# Patient Record
Sex: Male | Born: 1980 | Marital: Single | Smoking: Never smoker
Health system: Southern US, Community
[De-identification: ages and names within clinical notes are randomized; demographics above are authoritative.]

## PROBLEM LIST (undated history)

## (undated) DIAGNOSIS — C8190 Hodgkin lymphoma, unspecified, unspecified site: Secondary | ICD-10-CM

## (undated) DIAGNOSIS — I10 Essential (primary) hypertension: Secondary | ICD-10-CM

## (undated) DIAGNOSIS — B20 Human immunodeficiency virus [HIV] disease: Secondary | ICD-10-CM

## (undated) DIAGNOSIS — L309 Dermatitis, unspecified: Secondary | ICD-10-CM

## (undated) DIAGNOSIS — B029 Zoster without complications: Secondary | ICD-10-CM

## (undated) DIAGNOSIS — Z9109 Other allergy status, other than to drugs and biological substances: Secondary | ICD-10-CM

## (undated) DIAGNOSIS — Z21 Asymptomatic human immunodeficiency virus [HIV] infection status: Secondary | ICD-10-CM

## (undated) HISTORY — DX: Human immunodeficiency virus (HIV) disease: B20

## (undated) HISTORY — DX: Essential (primary) hypertension: I10

## (undated) HISTORY — DX: Dermatitis, unspecified: L30.9

## (undated) HISTORY — PX: TYMPANOSTOMY TUBE PLACEMENT: SHX32

## (undated) HISTORY — PX: WISDOM TOOTH EXTRACTION: SHX21

## (undated) HISTORY — DX: Asymptomatic human immunodeficiency virus (hiv) infection status: Z21

## (undated) HISTORY — PX: WRIST SURGERY: SHX841

---
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
## 2006-07-19 ENCOUNTER — Emergency Department (HOSPITAL_COMMUNITY): Admission: EM | Admit: 2006-07-19 | Discharge: 2006-07-19 | Payer: Self-pay | Admitting: Emergency Medicine

## 2006-12-28 ENCOUNTER — Ambulatory Visit: Payer: Self-pay | Admitting: Internal Medicine

## 2007-01-15 ENCOUNTER — Ambulatory Visit: Payer: Self-pay | Admitting: Internal Medicine

## 2007-01-22 ENCOUNTER — Ambulatory Visit: Payer: Self-pay | Admitting: Internal Medicine

## 2007-01-22 ENCOUNTER — Encounter: Payer: Self-pay | Admitting: Internal Medicine

## 2007-02-07 ENCOUNTER — Encounter: Payer: Self-pay | Admitting: Internal Medicine

## 2007-02-07 ENCOUNTER — Ambulatory Visit: Payer: Self-pay | Admitting: Internal Medicine

## 2007-04-12 ENCOUNTER — Ambulatory Visit: Payer: Self-pay | Admitting: Internal Medicine

## 2007-04-12 DIAGNOSIS — H6500 Acute serous otitis media, unspecified ear: Secondary | ICD-10-CM | POA: Insufficient documentation

## 2007-10-03 ENCOUNTER — Encounter (INDEPENDENT_AMBULATORY_CARE_PROVIDER_SITE_OTHER): Payer: Self-pay | Admitting: *Deleted

## 2007-10-03 ENCOUNTER — Ambulatory Visit: Payer: Self-pay | Admitting: Infectious Diseases

## 2007-10-03 DIAGNOSIS — L0291 Cutaneous abscess, unspecified: Secondary | ICD-10-CM | POA: Insufficient documentation

## 2007-10-03 DIAGNOSIS — L0390 Cellulitis, unspecified: Secondary | ICD-10-CM

## 2007-10-03 DIAGNOSIS — B350 Tinea barbae and tinea capitis: Secondary | ICD-10-CM | POA: Insufficient documentation

## 2007-10-03 DIAGNOSIS — J309 Allergic rhinitis, unspecified: Secondary | ICD-10-CM | POA: Insufficient documentation

## 2007-10-11 LAB — CONVERTED CEMR LAB
HIV-1 antibody: POSITIVE
HIV-2 Ab: NEGATIVE
HIV: REACTIVE

## 2007-10-16 ENCOUNTER — Ambulatory Visit: Payer: Self-pay | Admitting: Internal Medicine

## 2007-10-16 DIAGNOSIS — B20 Human immunodeficiency virus [HIV] disease: Secondary | ICD-10-CM | POA: Insufficient documentation

## 2007-10-16 LAB — CONVERTED CEMR LAB
ALT: 26 units/L (ref 0–53)
AST: 23 units/L (ref 0–37)
Albumin: 3.2 g/dL — ABNORMAL LOW (ref 3.5–5.2)
Alkaline Phosphatase: 65 units/L (ref 39–117)
BUN: 13 mg/dL (ref 6–23)
Basophils Absolute: 0 10*3/uL (ref 0.0–0.1)
Basophils Relative: 0.3 % (ref 0.0–1.0)
Bilirubin, Direct: 0.1 mg/dL (ref 0.0–0.3)
CO2: 29 meq/L (ref 19–32)
Calcium: 9.1 mg/dL (ref 8.4–10.5)
Chloride: 108 meq/L (ref 96–112)
Creatinine, Ser: 1.1 mg/dL (ref 0.4–1.5)
Eosinophils Absolute: 0.2 10*3/uL (ref 0.0–0.6)
Eosinophils Relative: 3.2 % (ref 0.0–5.0)
GFR calc Af Amer: 106 mL/min
GFR calc non Af Amer: 87 mL/min
Glucose, Bld: 77 mg/dL (ref 70–99)
HCT: 40.2 % (ref 39.0–52.0)
Hemoglobin: 13.7 g/dL (ref 13.0–17.0)
Lymphocytes Relative: 33.5 % (ref 12.0–46.0)
MCHC: 34.1 g/dL (ref 30.0–36.0)
MCV: 87.6 fL (ref 78.0–100.0)
Monocytes Absolute: 0.5 10*3/uL (ref 0.2–0.7)
Monocytes Relative: 8.2 % (ref 3.0–11.0)
Neutro Abs: 3.2 10*3/uL (ref 1.4–7.7)
Neutrophils Relative %: 54.8 % (ref 43.0–77.0)
Platelets: 256 10*3/uL (ref 150–400)
Potassium: 4 meq/L (ref 3.5–5.1)
RBC: 4.59 M/uL (ref 4.22–5.81)
RDW: 12.1 % (ref 11.5–14.6)
Sodium: 138 meq/L (ref 135–145)
TSH: 0.82 microintl units/mL (ref 0.35–5.50)
Total Bilirubin: 0.7 mg/dL (ref 0.3–1.2)
Total Protein: 9.2 g/dL — ABNORMAL HIGH (ref 6.0–8.3)
WBC: 5.9 10*3/uL (ref 4.5–10.5)

## 2007-10-17 ENCOUNTER — Encounter: Payer: Self-pay | Admitting: Internal Medicine

## 2007-10-17 LAB — CONVERTED CEMR LAB
Absolute CD4: 383 #/uL (ref 381–1469)
CD4 T Helper %: 25 % — ABNORMAL LOW (ref 32–62)
HCV Ab: NEGATIVE
HIV 1 RNA Quant: 38200 copies/mL — ABNORMAL HIGH (ref <50)
HIV-1 RNA Quant, Log: 4.58 — ABNORMAL HIGH (ref <1.70)
Hep B Core Total Ab: NEGATIVE
Hep B S Ab: POSITIVE — AB
Hepatitis B Surface Ag: NEGATIVE
Total Lymphocytes %: 30 % (ref 12–46)
Total lymphocyte count: 1530 cells/mcL (ref 700–3300)
WBC, lymph enumeration: 5.1 10*3/uL (ref 4.0–10.5)

## 2007-12-19 ENCOUNTER — Encounter: Payer: Self-pay | Admitting: Internal Medicine

## 2008-02-27 ENCOUNTER — Emergency Department (HOSPITAL_COMMUNITY): Admission: EM | Admit: 2008-02-27 | Discharge: 2008-02-28 | Payer: Self-pay | Admitting: Emergency Medicine

## 2008-04-09 ENCOUNTER — Ambulatory Visit: Payer: Self-pay | Admitting: Internal Medicine

## 2008-04-09 DIAGNOSIS — M659 Unspecified synovitis and tenosynovitis, unspecified site: Secondary | ICD-10-CM | POA: Insufficient documentation

## 2009-01-13 ENCOUNTER — Ambulatory Visit: Payer: Self-pay | Admitting: Internal Medicine

## 2009-01-13 DIAGNOSIS — M65839 Other synovitis and tenosynovitis, unspecified forearm: Secondary | ICD-10-CM | POA: Insufficient documentation

## 2009-01-13 DIAGNOSIS — M65849 Other synovitis and tenosynovitis, unspecified hand: Secondary | ICD-10-CM

## 2009-04-15 ENCOUNTER — Encounter: Payer: Self-pay | Admitting: Internal Medicine

## 2009-07-22 ENCOUNTER — Encounter: Payer: Self-pay | Admitting: Internal Medicine

## 2009-10-10 ENCOUNTER — Emergency Department (HOSPITAL_BASED_OUTPATIENT_CLINIC_OR_DEPARTMENT_OTHER): Admission: EM | Admit: 2009-10-10 | Discharge: 2009-10-10 | Payer: Self-pay | Admitting: Emergency Medicine

## 2009-12-12 ENCOUNTER — Other Ambulatory Visit: Payer: Self-pay | Admitting: Emergency Medicine

## 2009-12-12 ENCOUNTER — Inpatient Hospital Stay (HOSPITAL_COMMUNITY): Admission: EM | Admit: 2009-12-12 | Discharge: 2009-12-16 | Payer: Self-pay | Admitting: Internal Medicine

## 2009-12-28 ENCOUNTER — Ambulatory Visit: Payer: Self-pay | Admitting: Internal Medicine

## 2009-12-28 DIAGNOSIS — K5289 Other specified noninfective gastroenteritis and colitis: Secondary | ICD-10-CM | POA: Insufficient documentation

## 2009-12-30 ENCOUNTER — Telehealth: Payer: Self-pay

## 2010-08-23 NOTE — Assessment & Plan Note (Signed)
   Vital Signs:  Patient Profile:   30 Years Old Male Weight:      283 pounds               History of Present Illness: 30 year old gentleman, who presents with a chief complaint of swollen glands in the neck area.  These are largely painless.  He also has noticed slightly tender nodule just below the umbilicus.  He denies any fever or other constitutional symptoms.  He states that a nodule in the left pre- auricular area has decreased in size.         Physical Exam  General:     overweight-appearing.   Head:     Normocephalic and atraumatic without obvious abnormalities. No apparent alopecia or balding. Mouth:     Oral mucosa and oropharynx without lesions or exudates.  Teeth in good repair. Neck:     multiple nodules in the submandibular measuring two to 4 cm noted.  These are nontender.  Also present in the sub-mental area Abdomen:     Bowel sounds positive,abdomen soft and non-tender without masses, organomegaly or hernias noted. Cervical Nodes:     see above Axillary Nodes:     No palpable lymphadenopathy Inguinal Nodes:     No significant adenopathy    Impression & Recommendations:  Problem # 1:  cervical adenopathy believe this most likely represents a viral lymphadenitis and the patient may be improving.  Will recheck in two weeks or p.r.n. if he develops new symptoms.  The subcutaneous nodule just below the umbilicus appears to be a benign sebaceous cyst.   Patient Instructions: 1)  Please schedule a follow-up appointment in 2 weeks. 2)  The patient will return sooner if he develops worsening symptoms, enlarging lymph glands or any constitutional symptoms.

## 2010-08-23 NOTE — Assessment & Plan Note (Signed)
   Vital Signs:  Patient Profile:   30 Years Old Male Weight:      276 pounds BP supine:   136 / 76               History of Present Illness: 30 year old gentleman seen today for follow-up of his cervical adenopathy.  This has improved greatly and has largely resolved.  He denies any constitutional symptoms        Physical Exam  General:     Well-developed,well-nourished,in no acute distress; alert,appropriate and cooperative throughout examination Head:     Normocephalic and atraumatic without obvious abnormalities. No apparent alopecia or balding. Mouth:     Oral mucosa and oropharynx without lesions or exudates.  Teeth in good repair. Neck:     still has some right-sided subclavicular and also some submental a small nodes.  These have greatly diminished in size    Impression & Recommendations:  Problem # 1:  reactive lymphadenopathy, improved will continue to observe at the present time.  He will report any clinical worsening

## 2010-08-23 NOTE — Miscellaneous (Signed)
Summary: HIV Testing  HIV Testing   Imported By: Florinda Marker 10/14/2007 15:19:30  _____________________________________________________________________  External Attachment:    Type:   Image     Comment:   External Document

## 2010-08-23 NOTE — Progress Notes (Signed)
Summary: ppwk ready for pick up  Phone Note Outgoing Call   Call placed by: Duard Brady LPN,  December 30, 1608 9:28 AM Call placed to: Patient Summary of Call: ans mach - LMTCB if question - ppwk ready for pick up . KIK Initial call taken by: Duard Brady LPN,  December 31, 9602 9:28 AM

## 2010-08-23 NOTE — Letter (Signed)
Summary: Out of Work  Adult nurse at [employer]  [address]   [place], [place] [address]   Phone: [phone]  Fax: [phone]    December 28, 2009   Employee:  CHAVEZ ROSOL    To Whom It May Concern:   For Medical reasons, please excuse the above named employee from work for the following dates:  Start:   01-12-2010  End:   02-02-2010  If you need additional information, please feel free to contact our office.         Sincerely,    Gordy Savers  MD

## 2010-08-23 NOTE — Assessment & Plan Note (Signed)
Summary: ?carpal tunnel/cjr pt rsc/njr   Vital Signs:  Patient profile:   30 year old male Weight:      256 pounds BP sitting:   130 / 90  (right arm) Cuff size:   large  Vitals Entered By: Raechel Ache, RN (January 13, 2009 11:16 AM)  CC:  C/o wrist pain and swelling- L worse.Lee [name]  History of Present Illness: 30 year old gentleman who's had some bilateral wrist pain.  His right wrist has essentially normalized.  For the past two weeks, he  has had considerable discomfort involving the right radial wrist.  He has been using a wrist splint anti-inflammatories, rest and ice.  There is been little clinical improvement  Allergies: No Known Drug Allergies  Physical Exam  General:  Well-developed,well-nourished,in no acute distress; alert,appropriate and cooperative throughout examination Msk:  considerable tenderness about the radial aspect of the left wrist and at the base of the left thumb   Impression & Recommendations:  Problem # 1:  TENDINITIS, LEFT WRIST (ICD-727.05)  will set up for orthopedic evaluation  Orders: Orthopedic Referral (Ortho)  Complete Medication List: 1)  Diclofenac Sodium 75 Mg Tbec (Diclofenac sodium) .... One twice daily  Patient Instructions: 1)  Please schedule a follow-up appointment as needed. 2)  orthopedic follow-up as scheduled

## 2010-08-23 NOTE — Letter (Signed)
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         Sincerely,    Eleonore Chiquito, MD

## 2010-08-23 NOTE — Assessment & Plan Note (Signed)
Summary: cant bend fingers on right hand/mhf   Vital Signs:  Patient Profile:   30 Years Old Male Weight:      239 pounds Temp:     98.8 degrees F oral Pulse rate:   88 / minute BP sitting:   136 / 86  (left arm) Cuff size:   regular  Vitals Entered By: Jerilynn Mages (April 09, 2008 11:21 AM)                 Chief Complaint:  can't bend fingers on R hand.  History of Present Illness: 30 year old patient, positive HIV followed at [facility], who presented with an approximate two-month history of swelling and stiffness involving the MCP and PIP joints of his right second and third digits.  There is a no progression of symptoms.  No other joint involvement, or systemic symptoms.  No history of trauma    Current Allergies: No known allergies   Past Medical History:    Reviewed history from 10/16/2007 and no changes required:       Allergic rhinitis       HIV-positive      Physical Exam  General:     Well-developed,well-nourished,in no acute distress; alert,appropriate and cooperative throughout examination Msk:     patient does have some minimal soft tissue swelling, discomfort, and diminished range of motion involving his right second MCP and PIP joints    Impression & Recommendations:  Problem # 1:  SYNOVITIS (ICD-727.00) patient is scheduled for follow-up at [facility] later on this month;  will place on anti-inflammatory drugs, and they will consider rheumatologic referral and/or x-ray of the hand.  He has been told to call here if he develops any worsening symptoms  Problem # 2:  HIV TEST POSITIVE (ICD-042)  The following medications were removed from the medication list:    Doxycycline Hyclate 100 Mg Caps (Doxycycline hyclate) .Marland Kitchen... Take 1 capsule by mouth two times a day   Complete Medication List: 1)  Diclofenac Sodium 75 Mg Tbec (Diclofenac sodium) .... One twice daily   Patient Instructions: 1)   take medications as directed 2)   follow-up at the [facility] later this month as scheduled; 3)  consider x-ray and rheumatologic evaluation   Prescriptions: DICLOFENAC SODIUM 75 MG TBEC (DICLOFENAC SODIUM) one twice daily  #50 x 0   Entered and Authorized by:   Gordy Savers  MD   Signed by:   Gordy Savers  MD on 04/09/2008   Method used:   Print then Give to Patient   RxID:   [id number]  ]  Appended Document: cant bend fingers on right hand/mhf        Current Allergies: No known allergies         Impression & Recommendations: Flu Vaccine Consent Questions     Do you have a history of severe allergic reactions to this vaccine? no    Any prior history of allergic reactions to egg and/or gelatin? no    Do you have a sensitivity to the preservative Thimersol? no    Do you have a past history of Guillan-Barre Syndrome? no    Do you currently have an acute febrile illness? no    Have you ever had a severe reaction to latex? no    Vaccine information given and explained to patient? yes    Are you currently pregnant? no    Lot Number:[id number]   Site Given Right Deltoid IM   Complete Medication  List: 1)  Diclofenac Sodium 75 Mg Tbec (Diclofenac sodium) .... One twice daily    ]
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
## 2010-08-23 NOTE — Assessment & Plan Note (Signed)
Summary: abn labs   Vital Signs:  Patient Profile:   30 Years Old Male Weight:      243 pounds BP sitting:   128 / 80  (left arm) Cuff size:   regular  Vitals Entered By: Raechel Ache, RN (October 16, 2007 10:32 AM)                 Chief Complaint:  F/u abnormal labs..  History of Present Illness: 30 year old gentleman seen today to discuss his positive serology for HIV.  He was seen at the [facility] urgent care center last week and had an abdominal wall abscess I&D and an HIV test was performed that was reactive, with confirmation with Western blot testing.  At the present time, he clinically feels well and denies any systemic symptoms.  He was seen in June of last year for a viral syndrome, associated with cervical adenopathy.  Follow-up examination revealed resolution of the enlarged cervical glands.  The patient states that his male partner at that  time is now known to be HIV positive; he also states that his present whereabouts are unknown.    Current Allergies: No known allergies   Past Medical History:    Allergic rhinitis    HIV-positive  Past Surgical History:    none   Family History:    Family History Diabetes 1st degree relative    Family History Hypertension    father a second line is in good health    mother, age 40, living and well    one sister enjoys good health  Social History:    Single    Gay    employed by  [employer]    Review of Systems  The patient denies anorexia, fever, weight loss, muscle weakness, suspicious skin lesions, and enlarged lymph nodes.     Physical Exam  General:     Well-developed,well-nourished,in no acute distress; alert,appropriate and cooperative throughout examination Head:     Normocephalic and atraumatic without obvious abnormalities. No apparent alopecia or balding. Eyes:     No corneal or conjunctival inflammation noted. EOMI. Perrla. Funduscopic exam benign, without hemorrhages, exudates or  papilledema. Vision grossly normal. Mouth:     Oral mucosa and oropharynx without lesions or exudates.  Teeth in good repair. Neck:     No deformities, masses, or tenderness noted. Chest Wall:     No deformities, masses, tenderness or gynecomastia noted. Lungs:     Normal respiratory effort, chest expands symmetrically. Lungs are clear to auscultation, no crackles or wheezes. Heart:     Normal rate and regular rhythm. S1 and S2 normal without gallop, murmur, click, rub or other extra sounds. Abdomen:     Bowel sounds positive,abdomen soft and non-tender without masses, organomegaly or hernias noted. Msk:     No deformity or scoliosis noted of thoracic or lumbar spine.   Pulses:     R and L carotid,radial,femoral,dorsalis pedis and posterior tibial pulses are full and equal bilaterally Extremities:     No clubbing, cyanosis, edema, or deformity noted with normal full range of motion of all joints.   Skin:     2-cm nodule just to the right of the umbilicus.  Noninflammatory Cervical Nodes:     No lymphadenopathy noted Axillary Nodes:     No palpable lymphadenopathy Inguinal Nodes:     No significant adenopathy    Impression & Recommendations:  Problem # 1:  HIV TEST POSITIVE (ICD-042)  His updated medication list for  this problem includes:    Doxycycline Hyclate 100 Mg Caps (Doxycycline hyclate) .Marland Kitchen... Take 1 capsule by mouth two times a day  Orders: Venipuncture (21308) TLB-BMP (Basic Metabolic Panel-BMET) (80048-METABOL) TLB-CBC Platelet - w/Differential (85025-CBCD) TLB-Hepatic/Liver Function Pnl (80076-HEPATIC) TLB-TSH (Thyroid Stimulating Hormone) (84443-TSH) T-Hepatitis B Core Antibody (65784-69629) T-Hepatitis B Surface Antigen (52841-32440) T-Hepatitis B Surface Antibody (10272-53664) T-Hepatitis C Antibody (40347-42595) T-RPR (Syphilis) (63875-64332) Infectious Disease Referral (ID)  will check screen laboratory data to include chronic hepatitis panel, RPR;  will also check a CD4 count, HIV viral load. Will refer to ID clinic may be additional baseline laboratory serologies, such as  toxo titer  Complete Medication List: 1)  Doxycycline Hyclate 100 Mg Caps (Doxycycline hyclate) .... Take 1 capsule by mouth two times a day  Other Orders: T- * Misc. Laboratory test [phone])   Patient Instructions: 1)  follow-up with the infectious disease clinic as scheduled. 2)  Please schedule a follow-up appointment as needed.    ]

## 2010-08-23 NOTE — Consult Note (Signed)
[facility]   Imported By: Kassie Mends 01/10/2008 08:44:43  _____________________________________________________________________  External Attachment:    Type:   Image     Comment:   [facility]

## 2010-08-23 NOTE — Assessment & Plan Note (Signed)
Summary: CYST W/DRAINAGE/REFERRED FROM [facility] IN ED/DS   Vital Signs:  Patient Profile:   30 Years Old Male Weight:      240.8 pounds (109.45 kg) Temp:     97.5 degrees F (36.39 degrees C) oral Pulse rate:   88 / minute BP sitting:   133 / 77  (right arm)  Pt. in pain?   yes    Location:   right side of face, left side chest and upper right abd    Intensity:   6  Vitals Entered By: Stanton Kidney Ditzler RN (October 03, 2007 3:20 PM)              Is Patient Diabetic? No Nutritional Status Detail good  Have you ever been in a relationship where you felt threatened, hurt or afraid?denies   Does patient need assistance? Functional Status Self care Ambulation Normal     Chief Complaint:  New pt - to have cysts drained - has had for 2 months..  History of Present Illness: This is a 30 year old man with a past medical history of seasonal allergies who was referred here by an ED nurse for drainage of a "cyst" on his chest. He is actually a patient of [facility] and plans to return there as a regular patient. He has had these recurrent "cysts" for 2 months now, one on his face, one to the right of his navel, and one around his left nipple.  They are intermittently painful, with minimal swelling.  THe one on his face started draining yesterday.  The one on his chest is new.  Has no fever, chills, sweats.  No weight loss.    Current Allergies (reviewed today): No known allergies   Past Medical History:    Allergic rhinitis    Risk Factors:  Tobacco use:  quit   Review of Systems  General      Denies chills, fever, malaise, and sweats.  CV      Denies chest pain or discomfort, palpitations, and shortness of breath with exertion.  Resp      Denies cough and shortness of breath.  GI      Denies abdominal pain.  Derm      See HPI   Physical Exam  General:     alert, well-developed, well-nourished, and well-hydrated.   Head:     normocephalic and atraumatic.     Lungs:     Normal respiratory effort, chest expands symmetrically. Lungs are clear to auscultation, no crackles or wheezes. Heart:     Normal rate and regular rhythm. S1 and S2 normal without gallop, murmur, click, rub or other extra sounds. Abdomen:     Bowel sounds positive,abdomen soft and non-tender without masses, organomegaly or hernias noted. Skin:     As noted in HPI.  No signs of cellulitis, no drainage.  Lesion on chest and navel are too deep to I&D, with minimal fluctuance.  THey are tender, navel>chest.  Lesion on face draining serosanguinous fluid.  No signs of cellulitis.    Impression & Recommendations:  Problem # 1:  ABSCESS, SKIN (ICD-682.9) Too deep and too young to drain.  WIll recommended warm compresses 4 times a day along with doxycycline, and return either here or to his PCP next week for possible drainage if more superficial. His updated medication list for this problem includes:    Doxycycline Hyclate 100 Mg Caps (Doxycycline hyclate) .Marland Kitchen... Take 1 capsule by mouth two times a day  Orders:  T-HIV Antibody  (Reflex) ([id number])   Complete Medication List: 1)  Doxycycline Hyclate 100 Mg Caps (Doxycycline hyclate) .... Take 1 capsule by mouth two times a day   Patient Instructions: 1)  Please schedule a follow-up appointment at the beginning of next week, either here or with your primary doctor.    Prescriptions: DOXYCYCLINE HYCLATE 100 MG  CAPS (DOXYCYCLINE HYCLATE) Take 1 capsule by mouth two times a day  #20 x 0   Entered and Authorized by:   Dellia Beckwith MD   Signed by:   Dellia Beckwith MD on 10/03/2007   Method used:   Print then Give to Patient   RxID:   [id number]  ]

## 2010-08-23 NOTE — Letter (Signed)
Summary: [facility]  [facility]   Imported By: Maryln Gottron 07/29/2009 14:16:33  _____________________________________________________________________  External Attachment:    Type:   Image     Comment:   External Document

## 2010-08-23 NOTE — Letter (Signed)
Summary: Rheumatology-Dr. Kellie Simmering  Rheumatology-Dr. Kellie Simmering   Imported By: Maryln Gottron 04/23/2009 15:04:26  _____________________________________________________________________  External Attachment:    Type:   Image     Comment:   External Document

## 2010-10-10 LAB — CBC
HCT: 51.4 % (ref 39.0–52.0)
HCT: 41.8 % (ref 39.0–52.0)
HCT: 43.2 % (ref 39.0–52.0)
Hemoglobin: 17.7 g/dL — ABNORMAL HIGH (ref 13.0–17.0)
Hemoglobin: 14.8 g/dL (ref 13.0–17.0)
Hemoglobin: 15 g/dL (ref 13.0–17.0)
MCHC: 34.4 g/dL (ref 30.0–36.0)
MCHC: 34.8 g/dL (ref 30.0–36.0)
MCHC: 35.4 g/dL (ref 30.0–36.0)
MCV: 88.8 fL (ref 78.0–100.0)
MCV: 89.3 fL (ref 78.0–100.0)
MCV: 89.4 fL (ref 78.0–100.0)
Platelets: 345 10*3/uL (ref 150–400)
Platelets: 220 10*3/uL (ref 150–400)
Platelets: 228 10*3/uL (ref 150–400)
RBC: 5.79 MIL/uL (ref 4.22–5.81)
RBC: 4.68 MIL/uL (ref 4.22–5.81)
RBC: 4.84 MIL/uL (ref 4.22–5.81)
RDW: 13.4 % (ref 11.5–15.5)
RDW: 13.7 % (ref 11.5–15.5)
RDW: 14.2 % (ref 11.5–15.5)
WBC: 14.3 10*3/uL — ABNORMAL HIGH (ref 4.0–10.5)
WBC: 6.8 10*3/uL (ref 4.0–10.5)
WBC: 8.7 10*3/uL (ref 4.0–10.5)

## 2010-10-10 LAB — BASIC METABOLIC PANEL
BUN: 34 mg/dL — ABNORMAL HIGH (ref 6–23)
BUN: 10 mg/dL (ref 6–23)
BUN: 12 mg/dL (ref 6–23)
BUN: 20 mg/dL (ref 6–23)
CO2: 12 mEq/L — ABNORMAL LOW (ref 19–32)
CO2: 20 mEq/L (ref 19–32)
CO2: 20 mEq/L (ref 19–32)
CO2: 23 mEq/L (ref 19–32)
Calcium: 10.4 mg/dL (ref 8.4–10.5)
Calcium: 8.3 mg/dL — ABNORMAL LOW (ref 8.4–10.5)
Calcium: 8.5 mg/dL (ref 8.4–10.5)
Calcium: 8.5 mg/dL (ref 8.4–10.5)
Chloride: 109 mEq/L (ref 96–112)
Chloride: 107 mEq/L (ref 96–112)
Chloride: 108 mEq/L (ref 96–112)
Chloride: 109 mEq/L (ref 96–112)
Creatinine, Ser: 4.2 mg/dL — ABNORMAL HIGH (ref 0.4–1.5)
Creatinine, Ser: 1.12 mg/dL (ref 0.4–1.5)
Creatinine, Ser: 1.18 mg/dL (ref 0.4–1.5)
Creatinine, Ser: 1.43 mg/dL (ref 0.4–1.5)
GFR calc Af Amer: 21 mL/min — ABNORMAL LOW (ref >60)
GFR calc Af Amer: >60 mL/min (ref >60)
GFR calc Af Amer: >60 mL/min (ref >60)
GFR calc Af Amer: >60 mL/min (ref >60)
GFR calc non Af Amer: 17 mL/min — ABNORMAL LOW (ref >60)
GFR calc non Af Amer: 60 mL/min — ABNORMAL LOW (ref >60)
GFR calc non Af Amer: >60 mL/min (ref >60)
GFR calc non Af Amer: >60 mL/min (ref >60)
Glucose, Bld: 118 mg/dL — ABNORMAL HIGH (ref 70–99)
Glucose, Bld: 79 mg/dL (ref 70–99)
Glucose, Bld: 82 mg/dL (ref 70–99)
Glucose, Bld: 85 mg/dL (ref 70–99)
Potassium: 4.9 mEq/L (ref 3.5–5.1)
Potassium: 4 mEq/L (ref 3.5–5.1)
Potassium: 4 mEq/L (ref 3.5–5.1)
Potassium: 4.6 mEq/L (ref 3.5–5.1)
Sodium: 143 mEq/L (ref 135–145)
Sodium: 132 mEq/L — ABNORMAL LOW (ref 135–145)
Sodium: 132 mEq/L — ABNORMAL LOW (ref 135–145)
Sodium: 135 mEq/L (ref 135–145)

## 2010-10-10 LAB — COMPREHENSIVE METABOLIC PANEL
ALT: 22 U/L (ref 0–53)
AST: 33 U/L (ref 0–37)
Albumin: 2.7 g/dL — ABNORMAL LOW (ref 3.5–5.2)
Alkaline Phosphatase: 71 U/L (ref 39–117)
BUN: 23 mg/dL (ref 6–23)
CO2: 20 mEq/L (ref 19–32)
Calcium: 8.4 mg/dL (ref 8.4–10.5)
Chloride: 110 mEq/L (ref 96–112)
Creatinine, Ser: 1.45 mg/dL (ref 0.4–1.5)
GFR calc Af Amer: >60 mL/min (ref >60)
GFR calc non Af Amer: 59 mL/min — ABNORMAL LOW (ref >60)
Glucose, Bld: 90 mg/dL (ref 70–99)
Potassium: 4.2 mEq/L (ref 3.5–5.1)
Sodium: 133 mEq/L — ABNORMAL LOW (ref 135–145)
Total Bilirubin: 0.8 mg/dL (ref 0.3–1.2)
Total Protein: 10.6 g/dL — ABNORMAL HIGH (ref 6.0–8.3)

## 2010-10-10 LAB — URINALYSIS, ROUTINE W REFLEX MICROSCOPIC
Bilirubin Urine: NEGATIVE
Glucose, UA: NEGATIVE mg/dL
Glucose, UA: NEGATIVE mg/dL
Ketones, ur: 15 mg/dL — AB
Ketones, ur: 15 mg/dL — AB
Leukocytes, UA: NEGATIVE
Leukocytes, UA: NEGATIVE
Nitrite: NEGATIVE
Nitrite: NEGATIVE
Protein, ur: 100 mg/dL — AB
Protein, ur: 30 mg/dL — AB
Specific Gravity, Urine: 1.02 (ref 1.005–1.030)
Specific Gravity, Urine: 1.019 (ref 1.005–1.030)
Urobilinogen, UA: 0.2 mg/dL (ref 0.0–1.0)
Urobilinogen, UA: 0.2 mg/dL (ref 0.0–1.0)
pH: 5 (ref 5.0–8.0)
pH: 5 (ref 5.0–8.0)

## 2010-10-10 LAB — DIFFERENTIAL
Basophils Absolute: 0 10*3/uL (ref 0.0–0.1)
Basophils Relative: 0 % (ref 0–1)
Eosinophils Absolute: 0 10*3/uL (ref 0.0–0.7)
Eosinophils Relative: 0 % (ref 0–5)
Lymphocytes Relative: 17 % (ref 12–46)
Lymphs Abs: 2.4 10*3/uL (ref 0.7–4.0)
Monocytes Absolute: 0.6 10*3/uL (ref 0.1–1.0)
Monocytes Relative: 4 % (ref 3–12)
Neutro Abs: 11.3 10*3/uL — ABNORMAL HIGH (ref 1.7–7.7)
Neutrophils Relative %: 79 % — ABNORMAL HIGH (ref 43–77)

## 2010-10-10 LAB — CLOSTRIDIUM DIFFICILE EIA
C difficile Toxins A+B, EIA: NEGATIVE
C difficile Toxins A+B, EIA: NEGATIVE

## 2010-10-10 LAB — URINE CULTURE
Colony Count: NO GROWTH
Culture: NO GROWTH

## 2010-10-10 LAB — CULTURE, BLOOD (ROUTINE X 2)
Culture: NO GROWTH
Culture: NO GROWTH

## 2010-10-10 LAB — URINE MICROSCOPIC-ADD ON

## 2010-10-10 LAB — STOOL CULTURE

## 2010-10-10 LAB — PHOSPHORUS: Phosphorus: 3.4 mg/dL (ref 2.3–4.6)

## 2010-10-10 LAB — MAGNESIUM: Magnesium: 1.9 mg/dL (ref 1.5–2.5)

## 2010-10-10 LAB — LIPASE, BLOOD: Lipase: 166 U/L (ref 23–300)

## 2010-12-06 NOTE — Assessment & Plan Note (Signed)
[facility] OFFICE NOTE   Lee, [name]                        MRN:          [id number]  DATE:12/28/2006                            DOB:          [birth year]    A 30 year old gentleman who was well until 5 days ago.  For the past  several days he has had fever, sore throat, anorexia with nausea and  vomiting.  He has some diarrhea which has improved.  For the past 24  hours he has been able to tolerate some clear liquids and he feels  improved compared to yesterday.   EXAMINATION:  Revealed the patient to be alert, no acute distress  although he appeared unwell.  Temperature is 101.4, pulse was 90, blood  pressure 110/70.  HEAD AND NECK:  Revealed no trauma.  Pupil responses were normal.  Conjunctivae clear, anicteric.  EAR, NOSE AND THROAT:  Unremarkable except the oropharynx that was  slightly injected.  NECK:  Supple, there was no meningismus, no adenopathy.  CHEST:  Clear.  CARDIOVASCULAR:  Revealed no tachycardia but rate was about 90.  ABDOMEN:  Soft and nontender, bowel sounds were active.  There was  suggestion of some mild epigastric tenderness.   IMPRESSION:  Probable viral syndrome with nausea, vomiting, fever,  diarrhea.   DISPOSITION:  Will give samples of Protonix to take 40 mg once daily.  Was given injection of Phenergan 50 mg daily and will attempt to force  fluids.  Was given a prescription for oral Phenergan 50 mg.  Hopefully  he will be improved tomorrow, to the point he will be able to advance  his diet and try some light solids.  If there is any clinical worsening  he will be reassessed through the weekend and considered for hospital  admission.  He will take Tylenol 650 mg every 4-6 hours for fever in  excess of 100 degrees.     Gordy Savers, MD  Electronically Signed    PFK/MedQ  DD: 12/28/2006  DT: 12/28/2006  Job #: [id number]

## 2010-12-08 ENCOUNTER — Emergency Department (HOSPITAL_BASED_OUTPATIENT_CLINIC_OR_DEPARTMENT_OTHER)
Admission: EM | Admit: 2010-12-08 | Discharge: 2010-12-08 | Disposition: A | Discharge status: Home / Self Care | Payer: Self-pay | Attending: Emergency Medicine | Admitting: Emergency Medicine

## 2010-12-08 DIAGNOSIS — J329 Chronic sinusitis, unspecified: Secondary | ICD-10-CM | POA: Insufficient documentation

## 2011-06-14 ENCOUNTER — Emergency Department (HOSPITAL_BASED_OUTPATIENT_CLINIC_OR_DEPARTMENT_OTHER)
Admission: EM | Admit: 2011-06-14 | Discharge: 2011-06-15 | Disposition: A | Discharge status: Home / Self Care | Payer: Self-pay | Attending: Emergency Medicine | Admitting: Emergency Medicine

## 2011-06-14 ENCOUNTER — Encounter: Payer: Self-pay | Admitting: *Deleted

## 2011-06-14 DIAGNOSIS — B9789 Other viral agents as the cause of diseases classified elsewhere: Secondary | ICD-10-CM | POA: Insufficient documentation

## 2011-06-14 DIAGNOSIS — IMO0001 Reserved for inherently not codable concepts without codable children: Secondary | ICD-10-CM | POA: Insufficient documentation

## 2011-06-14 DIAGNOSIS — R51 Headache: Secondary | ICD-10-CM | POA: Insufficient documentation

## 2011-06-14 DIAGNOSIS — B349 Viral infection, unspecified: Secondary | ICD-10-CM

## 2011-06-14 HISTORY — DX: Other allergy status, other than to drugs and biological substances: Z91.09

## 2011-06-14 LAB — RAPID STREP SCREEN (MED CTR MEBANE ONLY): Streptococcus, Group A Screen (Direct): NEGATIVE

## 2011-06-14 MED ORDER — KETOROLAC TROMETHAMINE 30 MG/ML IJ SOLN: 60.0000 mg | Freq: Once | INTRAMUSCULAR | Status: DC

## 2011-06-14 MED ORDER — KETOROLAC TROMETHAMINE 60 MG/2ML IM SOLN
INTRAMUSCULAR | Status: AC
  Filled 2011-06-14: qty 2

## 2011-06-14 NOTE — ED Notes (Signed)
Pt c/o generalized boay aches and runny nose since yesterday.

## 2011-06-14 NOTE — ED Provider Notes (Signed)
History     CSN: [id number] Arrival date & time: 06/14/2011 10:38 PM   First MD Initiated Contact with Patient 06/14/11 2242      Chief Complaint  Patient presents with  . Influenza    (Consider location/radiation/quality/duration/timing/severity/associated sxs/prior treatment) Patient is a 30 y.o. male presenting with URI and pharyngitis.  URI The primary symptoms include fever, fatigue, headaches and myalgias. Primary symptoms do not include cough, abdominal pain, nausea, vomiting, arthralgias or rash.  The headache is not associated with weakness.  The myalgias are not associated with weakness.  The illness is not associated with chills, congestion or rhinorrhea.  Sore Throat This is a new problem. The current episode started yesterday. The problem occurs constantly. The problem has been gradually worsening. Associated symptoms include headaches. Pertinent negatives include no chest pain, no abdominal pain and no shortness of breath. Associated symptoms comments: Mild frontal headache.  Myalgias all over. The symptoms are relieved by nothing. He has tried acetaminophen for the symptoms. The treatment provided no relief.    Past Medical History  Diagnosis Date  . Environmental allergies     Past Surgical History  Procedure Date  . Wrist surgery     No family history on file.  History  Substance Use Topics  . Smoking status: Never Smoker   . Smokeless tobacco: Not on file  . Alcohol Use: No      Review of Systems  Constitutional: Positive for fever, appetite change and fatigue. Negative for chills and diaphoresis.  HENT: Negative for congestion, rhinorrhea and sneezing.   Eyes: Negative.   Respiratory: Negative for cough, chest tightness and shortness of breath.   Cardiovascular: Negative for chest pain and leg swelling.  Gastrointestinal: Negative for nausea, vomiting, abdominal pain, diarrhea and blood in stool.  Genitourinary: Negative for frequency,  hematuria, flank pain and difficulty urinating.  Musculoskeletal: Positive for myalgias and back pain. Negative for arthralgias.  Skin: Negative for rash.  Neurological: Positive for headaches. Negative for dizziness, speech difficulty, weakness and numbness.    Allergies  Review of patient's allergies indicates no known allergies.  Home Medications   Current Outpatient Rx  Name Route Sig Dispense Refill  . DIPHENHYDRAMINE HCL 25 MG PO TABS Oral Take 50 mg by mouth once.      Marland Kitchen ONE-DAILY MULTI VITAMINS PO TABS Oral Take 1 tablet by mouth daily.      . DAYQUIL/NYQUIL COLD/FLU RELIEF PO Oral Take 2 capsules by mouth every 6 (six) hours as needed. For congestion      . PSEUDOEPH-DOXYLAMINE-DM-APAP 60-12.12-20-998 MG/30ML PO LIQD Oral Take 30 mLs by mouth every 6 (six) hours as needed. For congestion        BP 132/85  Pulse 102  Temp(Src) 99.8 F (37.7 C) (Oral)  Resp 20  SpO2 99%  Physical Exam  Constitutional: He is oriented to person, place, and time. He appears well-developed and well-nourished.  HENT:  Head: Normocephalic and atraumatic.  Right Ear: External ear normal.  Left Ear: External ear normal.  Nose: Nose normal.  Mouth/Throat: Oropharynx is clear and moist.  Eyes: Pupils are equal, round, and reactive to light.  Neck: Normal range of motion. Neck supple.  Cardiovascular: Normal rate, regular rhythm and normal heart sounds.   Pulmonary/Chest: Effort normal and breath sounds normal. No respiratory distress. He has no wheezes. He has no rales. He exhibits no tenderness.  Abdominal: Soft. Bowel sounds are normal. There is no tenderness. There is no rebound and no guarding.  Musculoskeletal: Normal range of motion. He exhibits no edema.  Lymphadenopathy:    He has no cervical adenopathy.  Neurological: He is alert and oriented to person, place, and time.  Skin: Skin is warm and dry. No rash noted.  Psychiatric: He has a normal mood and affect.    ED Course    Procedures (including critical care time)   Labs Reviewed  RAPID STREP SCREEN   Results for orders placed during the hospital encounter of 06/14/11  RAPID STREP SCREEN      Component Value Range   Streptococcus, Group A Screen (Direct) NEGATIVE  NEGATIVE    No results found.    1. Viral syndrome       MDM  Pt well appearing, no signs of meningitis, pneumonia.  Likely viral        Rolan Bucco, MD 06/14/11 2351

## 2011-06-15 MED ORDER — KETOROLAC TROMETHAMINE 60 MG/2ML IM SOLN
60.0000 mg | Freq: Once | INTRAMUSCULAR | Status: AC
  Administered 2011-06-15: 60 mg via INTRAMUSCULAR

## 2011-07-30 ENCOUNTER — Encounter (HOSPITAL_BASED_OUTPATIENT_CLINIC_OR_DEPARTMENT_OTHER): Payer: Self-pay | Admitting: *Deleted

## 2011-07-30 ENCOUNTER — Emergency Department (HOSPITAL_BASED_OUTPATIENT_CLINIC_OR_DEPARTMENT_OTHER)
Admission: EM | Admit: 2011-07-30 | Discharge: 2011-07-30 | Disposition: A | Discharge status: Home / Self Care | Payer: Self-pay | Attending: Emergency Medicine | Admitting: Emergency Medicine

## 2011-07-30 DIAGNOSIS — B0230 Zoster ocular disease, unspecified: Secondary | ICD-10-CM

## 2011-07-30 DIAGNOSIS — B0239 Other herpes zoster eye disease: Secondary | ICD-10-CM | POA: Insufficient documentation

## 2011-07-30 MED ORDER — FLUORESCEIN SODIUM 1 MG OP STRP
1.0000 | ORAL_STRIP | Freq: Once | OPHTHALMIC | Status: AC
  Administered 2011-07-30: 1 via OPHTHALMIC
  Filled 2011-07-30: qty 1

## 2011-07-30 MED ORDER — ACYCLOVIR 200 MG PO CAPS
ORAL_CAPSULE | ORAL | Status: AC
  Filled 2011-07-30: qty 4

## 2011-07-30 MED ORDER — TETRACAINE HCL 0.5 % OP SOLN
2.0000 [drp] | Freq: Once | OPHTHALMIC | Status: AC
  Administered 2011-07-30: 2 [drp] via OPHTHALMIC
  Filled 2011-07-30: qty 2

## 2011-07-30 MED ORDER — PREDNISONE 50 MG PO TABS: 60.0000 mg | ORAL_TABLET | Freq: Once | ORAL | Status: DC

## 2011-07-30 MED ORDER — HYDROCODONE-ACETAMINOPHEN 5-325 MG PO TABS: 2.0000 | ORAL_TABLET | ORAL | Status: AC | PRN

## 2011-07-30 MED ORDER — PREDNISONE 20 MG PO TABS: 40.0000 mg | ORAL_TABLET | Freq: Every day | ORAL | Status: AC

## 2011-07-30 MED ORDER — VALACYCLOVIR HCL 1 G PO TABS: 1000.0000 mg | ORAL_TABLET | Freq: Three times a day (TID) | ORAL | Status: DC

## 2011-07-30 MED ORDER — ERYTHROMYCIN 5 MG/GM OP OINT: TOPICAL_OINTMENT | OPHTHALMIC | Status: AC

## 2011-07-30 MED ORDER — PREDNISONE 20 MG PO TABS
ORAL_TABLET | ORAL | Status: AC
  Administered 2011-07-30: 60 mg
  Filled 2011-07-30: qty 3

## 2011-07-30 MED ORDER — VALACYCLOVIR HCL 500 MG PO TABS
1000.0000 mg | ORAL_TABLET | Freq: Three times a day (TID) | ORAL | Status: DC
  Filled 2011-07-30: qty 2

## 2011-07-30 MED ORDER — ACYCLOVIR 200 MG PO CAPS
800.0000 mg | ORAL_CAPSULE | Freq: Once | ORAL | Status: AC
  Administered 2011-07-30: 800 mg via ORAL

## 2011-07-30 NOTE — ED Notes (Signed)
I place a call to the opthalmology on call, Dr. Vonna Kotyk for consult per Dr. Doylene Canard.

## 2011-07-30 NOTE — ED Provider Notes (Signed)
History   This chart was scribed for Felisa Bonier, MD by Charolett Bumpers . The patient was seen in room MHTR1/MHTR1 and the patient's care was started at 9:45pm.  CSN: [id number]  Arrival date & time 07/30/11  2038   First MD Initiated Contact with Patient 07/30/11 2115      Chief Complaint  Patient presents with  . Eye Pain    (Consider location/radiation/quality/duration/timing/severity/associated sxs/prior treatment) HPI Lee [name] is a 31 y.o. male who presents to the Emergency Department complaining of constant, moderate right eye pain with associated blisters above the right eye. Patient states the symptoms started this morning. Patient also says his right eye burns with bright light. Patient reports no itching. Patients also reports having chicken pox as a child.    Past Medical History  Diagnosis Date  . Environmental allergies     Past Surgical History  Procedure Date  . Wrist surgery     History reviewed. No pertinent family history.  History  Substance Use Topics  . Smoking status: Never Smoker   . Smokeless tobacco: Not on file  . Alcohol Use: No      Review of Systems A complete 10 system review of systems was obtained and is otherwise negative except as noted in the HPI and PMH.   Allergies  Review of patient's allergies indicates no known allergies.  Home Medications   Current Outpatient Rx  Name Route Sig Dispense Refill  . DIPHENHYDRAMINE HCL 25 MG PO TABS Oral Take 25 mg by mouth once.     Marland Kitchen PSEUDOEPHEDRINE-NAPROXEN NA ER 120-220 MG PO TB12 Oral Take 2 tablets by mouth every 4 (four) hours as needed. For congestion       BP 160/97  Pulse 91  Temp(Src) 98.4 F (36.9 C) (Oral)  Resp 18  Ht 6\' 4"  (1.93 m)  Wt 256 lb (116.121 kg)  BMI 31.16 kg/m2  SpO2 100%  Physical Exam  Nursing note and vitals reviewed. Constitutional: He is oriented to person, place, and time. He appears well-developed and well-nourished. No distress.    HENT:  Head: Normocephalic and atraumatic.    Right Ear: External ear normal.  Left Ear: External ear normal.  Mouth/Throat: Oropharynx is clear and moist.       Normal ear canal and TM on the left side.Tympanosic tube in right ear canal but no sores. Normal oropharynx, no lesions.   Eyes: EOM and lids are normal. Pupils are equal, round, and reactive to light. Right eye exhibits no chemosis, no discharge and no exudate. No foreign body present in the right eye. Left eye exhibits no chemosis, no discharge and no exudate. No foreign body present in the left eye. Right conjunctiva is not injected. Right conjunctiva has no hemorrhage. Left conjunctiva is not injected. Left conjunctiva has no hemorrhage. Right eye exhibits normal extraocular motion. Left eye exhibits normal extraocular motion.  Fundoscopic exam:      The right eye shows no exudate, no hemorrhage and no papilledema.       The left eye shows no exudate, no hemorrhage and no papilledema.  Slit lamp exam:      The right eye shows no corneal abrasion, no corneal flare, no corneal ulcer, no foreign body, no hyphema, no hypopyon, no fluorescein uptake and no anterior chamber bulge.       Vesicles suggestive of shingles above right eye. Normal fundoscopic eye exam.   Neck: Neck supple. No tracheal deviation present.  Cardiovascular: Normal  rate and regular rhythm.  Exam reveals no gallop and no friction rub.   No murmur heard. Pulmonary/Chest: Effort normal and breath sounds normal. No respiratory distress. He has no wheezes. He has no rales.  Abdominal: Soft. He exhibits no distension.  Musculoskeletal: Normal range of motion. He exhibits no edema.       Negative hutchechinson.  Neurological: He is alert and oriented to person, place, and time. No sensory deficit.  Skin: Skin is warm and dry.  Psychiatric: He has a normal mood and affect. His behavior is normal.    ED Course  Procedures (including critical care time)  DIAGNOSTIC  STUDIES: Oxygen Saturation is 100% on room air, normal by my interpretation.    COORDINATION OF CARE:     Labs Reviewed - No data to display No results found.   No diagnosis found.    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 He does have some sensitivity to light in the right eye however which is worrisome for ocular involvement. I will treat the patient with steroids and antiviral therapy and have referred him to see ophthalmology within the next 2 days for further evaluation. The patient states his understanding of the diagnosis and his understanding of the importance of ophthalmology followup promptly.   I personally performed the services described in this documentation, which was scribed in my presence. The recorded information has been reviewed and considered.      Felisa Bonier, MD 07/30/11 2206

## 2011-07-30 NOTE — ED Notes (Signed)
Pt c/o right eye pain. Burns with bright light. Blisters in eyebrow area on that side. No itching.

## 2011-08-01 ENCOUNTER — Encounter (HOSPITAL_BASED_OUTPATIENT_CLINIC_OR_DEPARTMENT_OTHER): Payer: Self-pay | Admitting: *Deleted

## 2011-08-01 ENCOUNTER — Emergency Department (HOSPITAL_BASED_OUTPATIENT_CLINIC_OR_DEPARTMENT_OTHER)
Admission: EM | Admit: 2011-08-01 | Discharge: 2011-08-01 | Disposition: A | Discharge status: Home / Self Care | Payer: Self-pay | Attending: Emergency Medicine | Admitting: Emergency Medicine

## 2011-08-01 DIAGNOSIS — H5789 Other specified disorders of eye and adnexa: Secondary | ICD-10-CM | POA: Insufficient documentation

## 2011-08-01 DIAGNOSIS — H53149 Visual discomfort, unspecified: Secondary | ICD-10-CM | POA: Insufficient documentation

## 2011-08-01 DIAGNOSIS — B0239 Other herpes zoster eye disease: Secondary | ICD-10-CM | POA: Insufficient documentation

## 2011-08-01 DIAGNOSIS — H5710 Ocular pain, unspecified eye: Secondary | ICD-10-CM | POA: Insufficient documentation

## 2011-08-01 DIAGNOSIS — B0233 Zoster keratitis: Secondary | ICD-10-CM | POA: Insufficient documentation

## 2011-08-01 DIAGNOSIS — B0230 Zoster ocular disease, unspecified: Secondary | ICD-10-CM

## 2011-08-01 DIAGNOSIS — G43909 Migraine, unspecified, not intractable, without status migrainosus: Secondary | ICD-10-CM | POA: Insufficient documentation

## 2011-08-01 DIAGNOSIS — B0231 Zoster conjunctivitis: Secondary | ICD-10-CM

## 2011-08-01 HISTORY — DX: Zoster without complications: B02.9

## 2011-08-01 LAB — CBC
HCT: 43.5 % (ref 39.0–52.0)
Hemoglobin: 15.6 g/dL (ref 13.0–17.0)
MCH: 30.8 pg (ref 26.0–34.0)
MCHC: 35.9 g/dL (ref 30.0–36.0)
MCV: 86 fL (ref 78.0–100.0)
Platelets: 254 10*3/uL (ref 150–400)
RBC: 5.06 MIL/uL (ref 4.22–5.81)
RDW: 14.3 % (ref 11.5–15.5)
WBC: 7.9 10*3/uL (ref 4.0–10.5)

## 2011-08-01 LAB — DIFFERENTIAL
Basophils Absolute: 0 10*3/uL (ref 0.0–0.1)
Basophils Relative: 0 % (ref 0–1)
Eosinophils Absolute: 0 10*3/uL (ref 0.0–0.7)
Eosinophils Relative: 1 % (ref 0–5)
Lymphocytes Relative: 13 % (ref 12–46)
Lymphs Abs: 1 10*3/uL (ref 0.7–4.0)
Monocytes Absolute: 0.6 10*3/uL (ref 0.1–1.0)
Monocytes Relative: 7 % (ref 3–12)
Neutro Abs: 6.2 10*3/uL (ref 1.7–7.7)
Neutrophils Relative %: 79 % — ABNORMAL HIGH (ref 43–77)

## 2011-08-01 MED ORDER — MORPHINE SULFATE 4 MG/ML IJ SOLN
4.0000 mg | Freq: Once | INTRAMUSCULAR | Status: AC
  Administered 2011-08-01: 4 mg via INTRAVENOUS
  Filled 2011-08-01: qty 1

## 2011-08-01 MED ORDER — METOCLOPRAMIDE HCL 5 MG/ML IJ SOLN
10.0000 mg | Freq: Once | INTRAMUSCULAR | Status: AC
  Administered 2011-08-01: 10 mg via INTRAVENOUS
  Filled 2011-08-01: qty 2

## 2011-08-01 MED ORDER — TETRACAINE HCL 0.5 % OP SOLN
1.0000 [drp] | Freq: Once | OPHTHALMIC | Status: AC
  Administered 2011-08-01: 1 [drp] via OPHTHALMIC
  Filled 2011-08-01: qty 2

## 2011-08-01 MED ORDER — ONDANSETRON HCL 4 MG PO TABS: 4.0000 mg | ORAL_TABLET | Freq: Four times a day (QID) | ORAL | Status: AC

## 2011-08-01 MED ORDER — FLUORESCEIN SODIUM 1 MG OP STRP
1.0000 | ORAL_STRIP | Freq: Once | OPHTHALMIC | Status: AC
  Administered 2011-08-01: 1 via OPHTHALMIC
  Filled 2011-08-01: qty 1

## 2011-08-01 MED ORDER — SODIUM CHLORIDE 0.9 % IV BOLUS (SEPSIS)
1000.0000 mL | Freq: Once | INTRAVENOUS | Status: AC
  Administered 2011-08-01: 1000 mL via INTRAVENOUS

## 2011-08-01 MED ORDER — DIPHENHYDRAMINE HCL 50 MG/ML IJ SOLN
25.0000 mg | Freq: Once | INTRAMUSCULAR | Status: AC
  Administered 2011-08-01: 25 mg via INTRAVENOUS
  Filled 2011-08-01: qty 1

## 2011-08-01 NOTE — ED Notes (Signed)
Patient states he was seen here 2 days ago and diagnosed with shingles.  States he continues to have pain and decreased vision in his right eye.  Was seen by a Opthalmology MD yesterday.  States he is having nausea and vomiting and a headache.

## 2011-08-01 NOTE — ED Notes (Signed)
Was seen here 2 days ago and diagnosed with shingles. Saw an eye specialist yesterday and was given eye drops for pain in his right eye. He is wearing dark glasses for light sensitivity. Here today with vomiting.

## 2011-08-01 NOTE — ED Provider Notes (Signed)
History     CSN: [id number]  Arrival date & time 08/01/11  1513   First MD Initiated Contact with Patient 08/01/11 1606      Chief Complaint  Patient presents with  . Eye Pain    nausea and vomiting    (Consider location/radiation/quality/duration/timing/severity/associated sxs/prior treatment) HPI  30yoM h/o migraines pw Rt eye pain. She seen here for same 2 days ago. He was diagnosed with shingles in the V1 distribution at that time. He states that time he did not have any problems with his right eye. He followed up with  ophthalmology yesterday and he confirmed that there was no eye involvement. He was given NSAID eye gtts. He states that since that time the pain from the rash in his eye pain has become progressively worse. He states that he has been having diffuse throbbing headache which is consistent with his migraine. He is also having multiple episodes of nonbilious nonbloody emesis. This is also typical migraine for him. He also complains of increased swelling in his right eye impression of the rash to his nose. He complains of decreased vision, blurry vision in his right eye as well. He is unable to tolerate by mouth intake at this time. Denies fevers, chills.   ED Notes, ED Provider Notes from 08/01/11 0000 to 08/01/11 15:26:40       Shela Commons, RN 08/01/2011 15:23      Patient states he was seen here 2 days ago and diagnosed with shingles. States he continues to have pain and decreased vision in his right eye. Was seen by a Opthalmology MD yesterday. States he is having nausea and vomiting and a headache.     Past Medical History  Diagnosis Date  . Environmental allergies   . Shingles     Past Surgical History  Procedure Date  . Wrist surgery   . Wisdom tooth extraction     No family history on file.  History  Substance Use Topics  . Smoking status: Never Smoker   . Smokeless tobacco: Not on file  . Alcohol Use: No    Review of Systems except as noted  HPI  Allergies  Review of patient's allergies indicates no known allergies.  Home Medications   Current Outpatient Rx  Name Route Sig Dispense Refill  . BROMFENAC SODIUM (ONCE-DAILY) 0.09 % OP SOLN Right Nare Place 1 drop into right nostril 2 (two) times daily.      . ERYTHROMYCIN 5 MG/GM OP OINT  Place a 1/2 inch ribbon of ointment into the lower eyelid nightly for the next 2 weeks. 3.5 g 0  . HYDROCODONE-ACETAMINOPHEN 5-325 MG PO TABS Oral Take 2 tablets by mouth every 4 (four) hours as needed for pain. 20 tablet 0  . PREDNISONE 20 MG PO TABS Oral Take 2 tablets (40 mg total) by mouth daily. 14 tablet 0  . VALACYCLOVIR HCL 1 G PO TABS Oral Take 1 tablet (1,000 mg total) by mouth 3 (three) times daily. 42 tablet 0    BP 139/84  Pulse 83  Temp(Src) 98.5 F (36.9 C) (Oral)  Resp 18  Ht 6\' 4"  (1.93 m)  Wt 260 lb (117.935 kg)  BMI 31.65 kg/m2  SpO2 100%  Physical Exam  Nursing note and vitals reviewed. Constitutional: He is oriented to person, place, and time. He appears well-developed and well-nourished. No distress.  HENT:  Head: Atraumatic.    Right Ear: External ear normal.  Eyes: Conjunctivae and EOM are normal. Pupils are  equal, round, and reactive to light.       R conjunctival erythema and discharge fluoroscein staining with small punctate areas uptake no obvious classic dendritic lesions +photophobia  Neck: Neck supple.  Cardiovascular: Normal rate, regular rhythm, normal heart sounds and intact distal pulses.  Exam reveals no gallop and no friction rub.   No murmur heard. Pulmonary/Chest: Effort normal. No respiratory distress. He has no wheezes. He has no rales.  Abdominal: Soft. Bowel sounds are normal. There is no tenderness. There is no rebound and no guarding.  Musculoskeletal: Normal range of motion. He exhibits no edema and no tenderness.  Neurological: He is alert and oriented to person, place, and time.  Skin: Skin is warm and dry.  Psychiatric: He has a  normal mood and affect.    ED Course  Procedures (including critical care time)  Labs Reviewed  DIFFERENTIAL - Abnormal; Notable for the following:    Neutrophils Relative 79 (*)    All other components within normal limits  CBC   No results found.   1. Herpes zoster ophthalmicus   2. Herpes zoster conjunctivitis   3. 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 Dec visual acuities R 20/40 Lt 20/20 He also has a headache/N/V which is typical of his chronic migraines. I do not suspect meningitis/encephalitis. IVF, reglan, benadryl, morphine. Ophthalmology consult. Reassess.  Patient states pain improved with rx as above but still present. Morphine ordered. Awaiting Ophtho callback  D/W Opthalmology-- continue erythromycin ointment for now. Will see in the office tomorrow morning instead of Monday. Call the office for Dr. Vonna Kotyk.  Feeling better. CBC unremarkable incl NL lymphocyte count. Tolerating PO without vomiting. Will f/u with ophthalmology tomorrow for further w/u and evaluation.        Forbes Cellar, MD 08/01/11 2150

## 2011-08-05 ENCOUNTER — Encounter (HOSPITAL_BASED_OUTPATIENT_CLINIC_OR_DEPARTMENT_OTHER): Payer: Self-pay

## 2011-08-05 ENCOUNTER — Emergency Department (HOSPITAL_BASED_OUTPATIENT_CLINIC_OR_DEPARTMENT_OTHER)
Admission: EM | Admit: 2011-08-05 | Discharge: 2011-08-05 | Disposition: A | Discharge status: Home / Self Care | Payer: Self-pay | Attending: Emergency Medicine | Admitting: Emergency Medicine

## 2011-08-05 DIAGNOSIS — B0230 Zoster ocular disease, unspecified: Secondary | ICD-10-CM

## 2011-08-05 DIAGNOSIS — B0239 Other herpes zoster eye disease: Secondary | ICD-10-CM | POA: Insufficient documentation

## 2011-08-05 MED ORDER — DIPHENHYDRAMINE HCL 50 MG/ML IJ SOLN
50.0000 mg | Freq: Once | INTRAMUSCULAR | Status: AC
  Administered 2011-08-05: 50 mg via INTRAMUSCULAR
  Filled 2011-08-05: qty 1

## 2011-08-05 MED ORDER — DIPHENHYDRAMINE HCL 25 MG PO TABS: 50.0000 mg | ORAL_TABLET | Freq: Four times a day (QID) | ORAL | Status: AC

## 2011-08-05 NOTE — ED Provider Notes (Signed)
History     CSN: [id number]  Arrival date & time 08/05/11  1026   First MD Initiated Contact with Patient 08/05/11 1139      Chief Complaint  Patient presents with  . Pruritis    (Consider location/radiation/quality/duration/timing/severity/associated sxs/prior treatment) HPI Patient presents with complaint of itching in the distribution of his rash on his face. Patient was diagnosed January 8 with herpes zoster in the V1 distribution on the right. He was started on antivirals, prednisone, pain medication, instead eye drops as well as erythromycin ointment. He has been seen twice by ophthalmology since onset of his illness. He is also going to followup again with ophthalmology in 2 days. He had been complaining of significant pain in his face and eye. Last night he states he began to have significant itching of the rash. His pain is under good control and he states the rash seems to be healing over but now he has significant itching. He has no swelling of his lips are, he has no diffuse itching or hives. No shortness of breath. He has not taken any medications for the itching but has continued to take all the other medications that have been prescribed for her herpes zoster. There no other alleviating or modifying factors. There no associated systemic symptoms. Past Medical History  Diagnosis Date  . Environmental allergies   . Shingles     Past Surgical History  Procedure Date  . Wrist surgery   . Wisdom tooth extraction     No family history on file.  History  Substance Use Topics  . Smoking status: Never Smoker   . Smokeless tobacco: Not on file  . Alcohol Use: No      Review of Systems ROS reviewed and otherwise negative except for mentioned in HPI  Allergies  Review of patient's allergies indicates no known allergies.  Home Medications   Current Outpatient Rx  Name Route Sig Dispense Refill  . BROMFENAC SODIUM (ONCE-DAILY) 0.09 % OP SOLN Right Nare Place 1 drop  into right nostril 2 (two) times daily.      Marland Kitchen DIPHENHYDRAMINE HCL 25 MG PO TABS Oral Take 2 tablets (50 mg total) by mouth every 6 (six) hours. Take 1-2 tablets every 6 hours x 2 days, then space out to an as needed basis 20 tablet 0  . ERYTHROMYCIN 5 MG/GM OP OINT  Place a 1/2 inch ribbon of ointment into the lower eyelid nightly for the next 2 weeks. 3.5 g 0  . HYDROCODONE-ACETAMINOPHEN 5-325 MG PO TABS Oral Take 2 tablets by mouth every 4 (four) hours as needed for pain. 20 tablet 0  . ONDANSETRON HCL 4 MG PO TABS Oral Take 1 tablet (4 mg total) by mouth every 6 (six) hours. 12 tablet 0  . PREDNISONE 20 MG PO TABS Oral Take 2 tablets (40 mg total) by mouth daily. 14 tablet 0  . VALACYCLOVIR HCL 1 G PO TABS Oral Take 1 tablet (1,000 mg total) by mouth 3 (three) times daily. 42 tablet 0    BP 171/102  Pulse 92  Temp(Src) 98 F (36.7 C) (Oral)  Resp 20  SpO2 100% Vitals reviewed Physical Exam Physical Examination: General appearance - alert, uncomfortable appearing, and in no distress Mental status - alert, oriented to person, place, and time Eyes - pupils equal and reactive, extraocular eye movements intact, + conjunctival injection Mouth - mucous membranes moist, pharynx normal without lesions Chest - clear to auscultation, no wheezes, rales or rhonchi, symmetric air  entry Heart - normal rate, regular rhythm, normal S1, S2, no murmurs, rubs, clicks or gallops Abdomen - soft, nontender, nondistended, no masses or organomegaly Musculoskeletal - no joint tenderness, deformity or swelling Extremities - peripheral pulses normal, no pedal edema, no clubbing or cyanosis Skin - vesicular lesions over right forehead, right eyelid, right upper scalp, no drainage or surrouding erythema  ED Course  Procedures (including critical care time)  Labs Reviewed - No data to display No results found.   1. 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 Patient has been evaluated twice by ophthalmology and is taking antivirals steroids pain medication antibacterial ophthalmic ointment. He states the pain has improved but last night developed severe itching of the right side of his for head in the distribution of the rash. He was given Benadryl in the ED for his symptoms and has a followup appointment in 2 days scheduled with ophthalmology. He was discharged with strict return precautions and is agreeable with this plan.        Ethelda Chick, MD 08/05/11 1432

## 2011-08-05 NOTE — ED Notes (Signed)
Pt c/o increased itching to R side of head and eye.  Onset 1/6.  Pt taking RX as prescribed with no relief.

## 2011-08-08 ENCOUNTER — Encounter: Payer: Self-pay | Admitting: Internal Medicine

## 2011-08-08 ENCOUNTER — Ambulatory Visit (INDEPENDENT_AMBULATORY_CARE_PROVIDER_SITE_OTHER): Payer: Self-pay | Admitting: Internal Medicine

## 2011-08-08 DIAGNOSIS — B20 Human immunodeficiency virus [HIV] disease: Secondary | ICD-10-CM

## 2011-08-08 DIAGNOSIS — B029 Zoster without complications: Secondary | ICD-10-CM

## 2011-08-08 NOTE — Patient Instructions (Signed)
Take your antibiotic as prescribed until ALL of it is gone, but stop if you develop a rash, swelling, or any side effects of the medication.  Contact our office as soon as possible if  there are side effects of the medication.  Ophthalmology followup  Call  in the spring  for referral to infectious disease clinic if no health insurance;  if you obtain health insurance soon, followup with infectious disease clinic at [facility]

## 2011-08-08 NOTE — Progress Notes (Signed)
  Subjective:    Patient ID: Lee [name], male    DOB: [birth year], 31 y.o.   MRN: [id number]  HPI  31 year old patient who is seen today for followup of shingles involving the first division of the right trigeminal nerve. He is followed closely ophthalmology due to eye involvement and still has some right eye sensitivity and blurring he is on a prednisone taper completing acyclovir and is on multiple eye drops. He is scheduled for ophthalmology followup within the next 2 weeks. He has been followed for HIV disease in [place] at [facility] but not sensitive June of 2011. Apparently he has not received anti-retroviral drug therapy in the past presently has no health insurance since losing his job with [employer].     Review of Systems  Eyes: Positive for photophobia, pain, discharge and itching.       Objective:   Physical Exam  Eyes: Pupils are equal, round, and reactive to light.       Mild right conjunctival injection  Skin:       Dry scaly rash involving the right parietal scalp and frontal area of the face. 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 We'll follow closely with ophthalmology due to eye involvement. We'll complete the acyclovir and prednisone taper. HIV disease. He was told to call the office in the spring if he does not have a job and insurance. He has been followed at [facility] but will try to arrange local followup if lack of health insurance precludes followup in [facility]

## 2011-08-13 ENCOUNTER — Encounter (HOSPITAL_BASED_OUTPATIENT_CLINIC_OR_DEPARTMENT_OTHER): Payer: Self-pay | Admitting: *Deleted

## 2011-08-13 ENCOUNTER — Emergency Department (HOSPITAL_BASED_OUTPATIENT_CLINIC_OR_DEPARTMENT_OTHER)
Admission: EM | Admit: 2011-08-13 | Discharge: 2011-08-13 | Disposition: A | Discharge status: Home / Self Care | Payer: Self-pay | Attending: Emergency Medicine | Admitting: Emergency Medicine

## 2011-08-13 DIAGNOSIS — B0229 Other postherpetic nervous system involvement: Secondary | ICD-10-CM | POA: Insufficient documentation

## 2011-08-13 MED ORDER — OXYCODONE-ACETAMINOPHEN 5-325 MG PO TABS: 1.0000 | ORAL_TABLET | Freq: Four times a day (QID) | ORAL | Status: AC | PRN

## 2011-08-13 NOTE — ED Provider Notes (Signed)
History     CSN: [id number]  Arrival date & time 08/13/11  0840   First MD Initiated Contact with Patient 08/13/11 1007      Chief Complaint  Patient presents with  . Blister    (Consider location/radiation/quality/duration/timing/severity/associated sxs/prior treatment) HPI Comments: Was seen here for shingles about 2 weeks ago.  Rash is improving but is having sharp pains, burning in the area.  Vision okay.  Due to follow up with the optholmologist in the next few days.    Patient is a 31 y.o. male presenting with rash. The history is provided by the patient.  Rash  This is a new problem. The current episode started more than 1 week ago. The problem has been gradually improving.    Past Medical History  Diagnosis Date  . Environmental allergies   . Shingles     Past Surgical History  Procedure Date  . Wrist surgery   . Wisdom tooth extraction     No family history on file.  History  Substance Use Topics  . Smoking status: Never Smoker   . Smokeless tobacco: Never Used  . Alcohol Use: No      Review of Systems  Skin: Positive for rash.  All other systems reviewed and are negative.    Allergies  Review of patient's allergies indicates no known allergies.  Home Medications   Current Outpatient Rx  Name Route Sig Dispense Refill  . ERYTHROMYCIN 5 MG/GM OP OINT  at bedtime.    . ACYCLOVIR 800 MG PO TABS Oral Take 800 mg by mouth 5 (five) times daily. For 10 days    . BROMFENAC SODIUM (ONCE-DAILY) 0.09 % OP SOLN Right Nare Place 1 drop into right nostril 2 (two) times daily.      Marland Kitchen DIPHENHYDRAMINE HCL 25 MG PO TABS Oral Take 2 tablets (50 mg total) by mouth every 6 (six) hours. Take 1-2 tablets every 6 hours x 2 days, then space out to an as needed basis 20 tablet 0  . NEPAFENAC 0.1 % OP SUSP Right Eye Place 3 drops into the right eye 3 (three) times daily.      BP 153/76  Pulse 94  Temp(Src) 98 F (36.7 C) (Oral)  Resp 18  SpO2 99%  Physical Exam    Nursing note and vitals reviewed. Constitutional: He is oriented to person, place, and time. He appears well-developed and well-nourished.  HENT:  Head: Normocephalic.       There area several areas to the right temple and periorbital skin of healing vesicles.    Eyes: EOM are normal. Pupils are equal, round, and reactive to light.  Neck: Normal range of motion. Neck supple.  Musculoskeletal: Normal range of motion.  Neurological: He is alert and oriented to person, place, and time.  Skin:       Rash as above    ED Course  Procedures (including critical care time)  Labs Reviewed - No data to display No results found.   No diagnosis found.    MDM  Looks like he is developing a post-herpetic neuralgia.  Will give pain meds, time.        Geoffery Lyons, MD 08/13/11 1020

## 2011-08-13 NOTE — ED Notes (Signed)
Pt has blister like and scabbed over areas to right eye and face.  Pt states it is burning and painful, intermittent stinging.  Some tearing and blurred vision to right eye.  Pt states he was seen earlier this month with same here but symptoms are not improving.

## 2011-08-17 ENCOUNTER — Telehealth: Payer: Self-pay | Admitting: Internal Medicine

## 2011-08-17 MED ORDER — GABAPENTIN 100 MG PO CAPS: 100.0000 mg | ORAL_CAPSULE | Freq: Three times a day (TID) | ORAL | Status: DC

## 2011-08-17 MED ORDER — HYDROCODONE-ACETAMINOPHEN 5-500 MG PO TABS: 1.0000 | ORAL_TABLET | Freq: Four times a day (QID) | ORAL | Status: AC | PRN

## 2011-08-17 NOTE — Telephone Encounter (Signed)
Pt saw Dr Kirtland Bouchard last week for a follow up shingles on his face. He finished the meds that were rx'd in the ER. He is still having pain forehead and eyes. Should he get a shot, more meds? Please advise. He refused ov at this time.  [facility]   [address].

## 2011-08-17 NOTE — Telephone Encounter (Signed)
Pt is requesting more analgesics for pain control.  Pls send to [facility] on [name]. Pt states that at times during the day he experiences a feeling of a bee sting in the middle of his forehead that becomes excruciating in pain. Pt states the pain then radiates to his right ear and a cold pack use to help but now the cold pack seems to make his face burn.  Pls advise.

## 2011-08-17 NOTE — Telephone Encounter (Signed)
No further active treatment will be helpful or will be appropriate; check with patient if he desires any analgesics for pain control

## 2011-08-17 NOTE — Telephone Encounter (Signed)
Please call in a new prescription for Neurontin 100 mg (generic) #30 to take 1 3 times a day refill x3  Generic Vicodin 500/500 #60 one every 6 hours as needed for pain refill one

## 2011-08-17 NOTE — Telephone Encounter (Signed)
Rx called in to pharmacy. 

## 2012-01-30 ENCOUNTER — Telehealth: Payer: Self-pay | Admitting: Internal Medicine

## 2012-01-30 NOTE — Telephone Encounter (Signed)
Called pt and schd for CPX on 02/14/12. Pt is bringing in cdl forms.

## 2012-01-30 NOTE — Telephone Encounter (Signed)
He will need a cpx appt - and we have dot forms here

## 2012-01-30 NOTE — Telephone Encounter (Signed)
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 Does this require ov for 15 min or 30 min appt, or can pt bring in form for pcp to sign?

## 2012-02-14 ENCOUNTER — Encounter: Payer: Self-pay | Admitting: Internal Medicine

## 2012-06-03 ENCOUNTER — Telehealth: Payer: Self-pay | Admitting: Internal Medicine

## 2012-06-03 NOTE — Telephone Encounter (Signed)
Pt would like Dr Kirtland Bouchard to call him about a concern and possible referral. [phone]

## 2012-06-03 NOTE — Telephone Encounter (Signed)
Pt states that he is having a reduced sex drive, would like to speak with you in regards to which medication you would prefer if any. Would rather have you prescribe an Rx or give an advice than someone else.

## 2012-06-03 NOTE — Telephone Encounter (Signed)
Suggest  the patient come by for a early-morning office visit with testosterone level check between 8 AM and 10 AM

## 2012-06-04 NOTE — Telephone Encounter (Signed)
Pt notified. Stated he has no insurance at this time will call back to schedule appt when he is back in [place].

## 2012-06-10 ENCOUNTER — Telehealth: Payer: Self-pay | Admitting: Internal Medicine

## 2012-06-10 NOTE — Telephone Encounter (Signed)
Pt called and is in town today. Will be leaving to go back out of town tomorrow. Pt is suppose to come in for fup re: testosterone, per previous phone note. Is it ok to let pt come in to see another dr, if pcp not avail?

## 2012-06-10 NOTE — Telephone Encounter (Signed)
Patient needs in the early morning Testosterone level drawn. If patient is able can come in for a lab draw only tomorrow. Does not need office visit

## 2012-06-11 ENCOUNTER — Other Ambulatory Visit (INDEPENDENT_AMBULATORY_CARE_PROVIDER_SITE_OTHER): Payer: Self-pay

## 2012-06-11 DIAGNOSIS — R6882 Decreased libido: Secondary | ICD-10-CM

## 2012-06-11 LAB — TESTOSTERONE: Testosterone: 469.66 ng/dL (ref 350.00–890.00)

## 2012-06-12 MED ORDER — SILDENAFIL CITRATE 100 MG PO TABS: 100.0000 mg | ORAL_TABLET | Freq: Every day | ORAL | Status: DC | PRN

## 2012-06-12 NOTE — Addendum Note (Signed)
Addended by: Jimmye Norman on: 06/12/2012 01:57 PM   Modules accepted: Orders

## 2013-10-16 ENCOUNTER — Telehealth: Payer: Self-pay

## 2013-10-16 NOTE — Telephone Encounter (Signed)
Spoke to pt told him can not call Rx in for him needs to be seen, last visit was 05/2012. Told pt can try OTC allergy medications and we have a Saturday clinic but needs an appointment. Pt verbalized understanding.

## 2013-10-16 NOTE — Telephone Encounter (Signed)
Pt is req rx for allergies// running nose,sneezing,watery eyes   Pt refuse office visit

## 2013-11-03 ENCOUNTER — Ambulatory Visit (INDEPENDENT_AMBULATORY_CARE_PROVIDER_SITE_OTHER): Payer: BC Managed Care – PPO | Admitting: Internal Medicine

## 2013-11-03 ENCOUNTER — Encounter: Payer: Self-pay | Admitting: Internal Medicine

## 2013-11-03 VITALS — BP 130/80 | HR 91 | Temp 98.7°F | Resp 20 | Ht 74.0 in | Wt 264.0 lb

## 2013-11-03 DIAGNOSIS — J309 Allergic rhinitis, unspecified: Secondary | ICD-10-CM

## 2013-11-03 DIAGNOSIS — B20 Human immunodeficiency virus [HIV] disease: Secondary | ICD-10-CM

## 2013-11-03 MED ORDER — PREDNISONE 10 MG PO TABS: ORAL_TABLET | ORAL | Status: DC

## 2013-11-03 MED ORDER — FLUTICASONE PROPIONATE 50 MCG/ACT NA SUSP: 2.0000 | Freq: Every day | NASAL | Status: DC

## 2013-11-03 NOTE — Patient Instructions (Addendum)
Prednisone taper as discussed Continue Zyrtec Nasal steroid as discussed  Follow up  infectious diseaseAllergic Rhinitis Allergic rhinitis is when the mucous membranes in the nose respond to allergens. Allergens are particles in the air that cause your body to have an allergic reaction. This causes you to release allergic antibodies. Through a chain of events, these eventually cause you to release histamine into the blood stream. Although meant to protect the body, it is this release of histamine that causes your discomfort, such as frequent sneezing, congestion, and an itchy, runny nose.  CAUSES  Seasonal allergic rhinitis (hay fever) is caused by pollen allergens that may come from grasses, trees, and weeds. Year-round allergic rhinitis (perennial allergic rhinitis) is caused by allergens such as house dust mites, pet dander, and mold spores.  SYMPTOMS   Nasal stuffiness (congestion).  Itchy, runny nose with sneezing and tearing of the eyes. DIAGNOSIS  Your health care provider can help you determine the allergen or allergens that trigger your symptoms. If you and your health care provider are unable to determine the allergen, skin or blood testing may be used. TREATMENT  Allergic Rhinitis does not have a cure, but it can be controlled by:  Medicines and allergy shots (immunotherapy).  Avoiding the allergen. Hay fever may often be treated with antihistamines in pill or nasal spray forms. Antihistamines block the effects of histamine. There are over-the-counter medicines that may help with nasal congestion and swelling around the eyes. Check with your health care provider before taking or giving this medicine.  If avoiding the allergen or the medicine prescribed do not work, there are many new medicines your health care provider can prescribe. Stronger medicine may be used if initial measures are ineffective. Desensitizing injections can be used if medicine and avoidance does not work.  Desensitization is when a patient is given ongoing shots until the body becomes less sensitive to the allergen. Make sure you follow up with your health care provider if problems continue. HOME CARE INSTRUCTIONS It is not possible to completely avoid allergens, but you can reduce your symptoms by taking steps to limit your exposure to them. It helps to know exactly what you are allergic to so that you can avoid your specific triggers. SEEK MEDICAL CARE IF:   You have a fever.  You develop a cough that does not stop easily (persistent).  You have shortness of breath.  You start wheezing.  Symptoms interfere with normal daily activities. Document Released: 04/04/2001 Document Revised: 04/30/2013 Document Reviewed: 03/17/2013 [facility] Patient Information 2014 [place].

## 2013-11-03 NOTE — Progress Notes (Signed)
Subjective:    Patient ID: [place] [name], male    DOB: [birth year], 33 y.o.   MRN: [id number]  HPI  33 year old patient who is seen today with a chief complaint of a significant allergy flare.  For the past several days.  She has had red, itchy eyes, nasal congestion, drainage, and cough.  He feels generally unwell.  He does have a history of HIV disease   Past Medical History  Diagnosis Date  . Environmental allergies   . Shingles     History   Social History  . Marital Status: Single    Spouse Name: N/A    Number of Children: N/A  . Years of Education: N/A   Occupational History  . Not on file.   Social History Main Topics  . Smoking status: Never Smoker   . Smokeless tobacco: Never Used  . Alcohol Use: No  . Drug Use: No  . Sexual Activity: Not on file   Other Topics Concern  . Not on file   Social History Narrative  . No narrative on file    Past Surgical History  Procedure Laterality Date  . Wrist surgery    . Wisdom tooth extraction      No family history on file.  No Known Allergies  No current outpatient prescriptions on file prior to visit.   No current facility-administered medications on file prior to visit.    BP 130/80  Pulse 91  Temp(Src) 98.7 F (37.1 C) (Oral)  Resp 20  Ht 6\' 2"  (1.88 m)  Wt 264 lb (119.75 kg)  BMI 33.88 kg/m2  SpO2 97%       Review of Systems  Constitutional: Positive for activity change and fatigue. Negative for fever, chills and appetite change.  HENT: Positive for congestion, nosebleeds, postnasal drip, rhinorrhea, sinus pressure and voice change. Negative for dental problem, ear pain, hearing loss, sore throat, tinnitus and trouble swallowing.   Eyes: Positive for redness and itching. Negative for pain, discharge and visual disturbance.  Respiratory: Positive for cough. Negative for chest tightness, wheezing and stridor.   Cardiovascular: Negative for chest pain, palpitations and leg swelling.    Gastrointestinal: Negative for nausea, vomiting, abdominal pain, diarrhea, constipation, blood in stool and abdominal distention.  Genitourinary: Negative for urgency, hematuria, flank pain, discharge, difficulty urinating and genital sores.  Musculoskeletal: Negative for arthralgias, back pain, gait problem, joint swelling, myalgias and neck stiffness.  Skin: Negative for rash.  Neurological: Negative for dizziness, syncope, speech difficulty, weakness, numbness and headaches.  Hematological: Negative for adenopathy. Does not bruise/bleed easily.  Psychiatric/Behavioral: Negative for behavioral problems and dysphoric mood. The patient is not nervous/anxious.        Objective:   Physical Exam  Constitutional: He is oriented to person, place, and time. He appears well-developed.  HENT:  Head: Normocephalic.  Right Ear: External ear normal.  Left Ear: External ear normal.  Eyes: Conjunctivae and EOM are normal.  Conjunctival injection  Neck: Normal range of motion.  Cardiovascular: Normal rate and normal heart sounds.   Pulmonary/Chest: Breath sounds normal. No respiratory distress. He has no wheezes.  Abdominal: Bowel sounds are normal.  Musculoskeletal: Normal range of motion. He exhibits no edema and no tenderness.  Neurological: He is alert and oriented to person, place, and time.  Psychiatric: He has a normal mood and affect. His behavior is normal.          Assessment & Plan:  Exacerbation of allergic rhinitis.  We'll treat  with a prednisone Dosepak.  He will continue Zyrtec.  We'll at a nasal steroid. HIV disease.  Followup ID

## 2013-11-03 NOTE — Progress Notes (Signed)
Pre-visit discussion using our clinic review tool. No additional management support is needed unless otherwise documented below in the visit note.  

## 2014-02-14 ENCOUNTER — Emergency Department (INDEPENDENT_AMBULATORY_CARE_PROVIDER_SITE_OTHER)
Admission: EM | Admit: 2014-02-14 | Discharge: 2014-02-14 | Disposition: A | Discharge status: Home / Self Care | Payer: BC Managed Care – PPO | Source: Home / Self Care | Attending: Family Medicine | Admitting: Family Medicine

## 2014-02-14 ENCOUNTER — Encounter (HOSPITAL_COMMUNITY): Payer: Self-pay | Admitting: Family Medicine

## 2014-02-14 DIAGNOSIS — IMO0002 Reserved for concepts with insufficient information to code with codable children: Secondary | ICD-10-CM

## 2014-02-14 DIAGNOSIS — L02419 Cutaneous abscess of limb, unspecified: Secondary | ICD-10-CM

## 2014-02-14 MED ORDER — KETOROLAC TROMETHAMINE 60 MG/2ML IM SOLN
60.0000 mg | Freq: Once | INTRAMUSCULAR | Status: AC
  Administered 2014-02-14: 60 mg via INTRAMUSCULAR

## 2014-02-14 MED ORDER — CEPHALEXIN 500 MG PO CAPS: 500.0000 mg | ORAL_CAPSULE | Freq: Two times a day (BID) | ORAL | Status: DC

## 2014-02-14 MED ORDER — KETOROLAC TROMETHAMINE 60 MG/2ML IM SOLN
INTRAMUSCULAR | Status: AC
  Filled 2014-02-14: qty 2

## 2014-02-14 NOTE — ED Notes (Signed)
C/o boil under right arm pit States area is painful Denies any drainage

## 2014-02-14 NOTE — Discharge Instructions (Signed)
Your abscess was successfully drained in the clinic today Please start your antibiotics and take them for the full 14 days Please come back if you are not getting better

## 2014-02-14 NOTE — ED Provider Notes (Addendum)
CSN: [id number]     Arrival date & time 02/14/14  1100 History   None    Chief Complaint  Patient presents with  . Recurrent Skin Infections   (Consider location/radiation/quality/duration/timing/severity/associated sxs/prior Treatment) HPI  Boil started 7 days ago. 2. First one ruptured own. R axilla. Getting bigger. Shaves under arms. Hot compresses, neosporin, w/o benefit. Painful. Unable to sleep due to pain. Denies fevers, aches, chills. No discharge from remaining boil.    Past Medical History  Diagnosis Date  . Environmental allergies   . Shingles    Past Surgical History  Procedure Laterality Date  . Wrist surgery    . Wisdom tooth extraction     History reviewed. No pertinent family history. History  Substance Use Topics  . Smoking status: Never Smoker   . Smokeless tobacco: Never Used  . Alcohol Use: No    Review of Systems Per HPI with all other pertinent systems negative.   Allergies  Review of patient's allergies indicates no known allergies.  Home Medications   Prior to Admission medications   Medication Sig Start Date End Date Taking? Authorizing Provider  cephALEXin (KEFLEX) 500 MG capsule Take 1 capsule (500 mg total) by mouth 2 (two) times daily. 02/14/14   Waldemar Dickens, MD  Cetirizine HCl (ZYRTEC ALLERGY PO) Take 1 tablet by mouth as needed.    Historical Provider, MD  fluticasone (FLONASE) 50 MCG/ACT nasal spray Place 2 sprays into both nostrils daily. 11/03/13   Marletta Lor, MD  predniSONE (DELTASONE) 10 MG tablet 3 tablets twice daily for 2 days, then 2 tablets twice daily for 2 days,  then 1 tablet twice daily for 2 days, then one tablet daily  for 6 days 11/03/13   Marletta Lor, MD   BP 146/93  Pulse 94  Temp(Src) 98.2 F (36.8 C) (Oral)  Resp 18  SpO2 99% Physical Exam  Constitutional: He appears well-developed and well-nourished. No distress.  HENT:  Head: Normocephalic and atraumatic.  Eyes: EOM are normal. Pupils are  equal, round, and reactive to light.  Neck: Normal range of motion.  Pulmonary/Chest: Effort normal. No respiratory distress.  Abdominal: Soft.  Musculoskeletal: Normal range of motion.  Neurological: He is alert. He exhibits normal muscle tone.  Skin: He is not diaphoretic.  Large 2x3cm R axillary area of induration and fluctuance. sourounding small areas of induration about 1x1cm w/o fluctuance  Psychiatric: He has a normal mood and affect. His behavior is normal. Judgment and thought content normal.  Incision and Drainage Procedure Note  Pre-operative Diagnosis: abscess  Post-operative Diagnosis: same   Anesthesia: 1% lidocaine with epinephrine  Procedure Details  The procedure, risks and complications have been discussed in detail (including, but not limited to airway compromise, infection, bleeding) with the patient, and the patient has signed consent to the procedure.  The skin was sterilely prepped and draped over the affected area in the usual fashion. After adequate local anesthesia, I&D with a #11 blade was performed on the right Axilla. Purulent drainage: present The patient was observed until stable.  Findings: Large abscess w/o loculations approximately 2cm in total depth  EBL: 3 cc's  Drains: none  Condition: Tolerated procedure well   Complications: none.    ED Course  Procedures (including critical care time) Labs Review Labs Reviewed - No data to display  Imaging Review No results found.   MDM   1. Abscess, axilla    R axillary abscess w/ sourounding folliculitis I&D as above  Start Keflex ketorolac TORADOL IM 60 mg in office for pain relief  Linna Darner, MD Family Medicine 02/14/2014, 11:47 AM     Waldemar Dickens, MD 02/14/14 [place], MD 02/14/14 0539  Waldemar Dickens, MD 05/26/14 [phone]

## 2014-02-18 LAB — WOUND CULTURE

## 2014-02-18 NOTE — ED Notes (Signed)
Wound culture R axilla: Mod. Staph. Aureus.  Pt. adequately treated with Keflex. Roselyn Meier 02/18/2014

## 2014-04-01 ENCOUNTER — Encounter (HOSPITAL_BASED_OUTPATIENT_CLINIC_OR_DEPARTMENT_OTHER): Payer: Self-pay | Admitting: Emergency Medicine

## 2014-04-01 ENCOUNTER — Emergency Department (HOSPITAL_BASED_OUTPATIENT_CLINIC_OR_DEPARTMENT_OTHER)
Admission: EM | Admit: 2014-04-01 | Discharge: 2014-04-01 | Disposition: A | Discharge status: Home / Self Care | Payer: BC Managed Care – PPO | Attending: Emergency Medicine | Admitting: Emergency Medicine

## 2014-04-01 DIAGNOSIS — Z8709 Personal history of other diseases of the respiratory system: Secondary | ICD-10-CM | POA: Insufficient documentation

## 2014-04-01 DIAGNOSIS — R21 Rash and other nonspecific skin eruption: Secondary | ICD-10-CM

## 2014-04-01 DIAGNOSIS — Z792 Long term (current) use of antibiotics: Secondary | ICD-10-CM | POA: Insufficient documentation

## 2014-04-01 DIAGNOSIS — Z8619 Personal history of other infectious and parasitic diseases: Secondary | ICD-10-CM | POA: Diagnosis not present

## 2014-04-01 DIAGNOSIS — IMO0002 Reserved for concepts with insufficient information to code with codable children: Secondary | ICD-10-CM | POA: Diagnosis not present

## 2014-04-01 MED ORDER — BETAMETHASONE DIPROPIONATE 0.05 % EX OINT: TOPICAL_OINTMENT | Freq: Two times a day (BID) | CUTANEOUS | Status: DC

## 2014-04-01 NOTE — Discharge Instructions (Signed)
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  Please use the provided prescription steroid cream twice daily for the next week.  Return here for concerning changes in your condition.

## 2014-04-01 NOTE — ED Notes (Signed)
C/o rash to neck, back, abd x 1 month

## 2014-04-01 NOTE — ED Provider Notes (Signed)
CSN: [id number]     Arrival date & time 04/01/14  2107 History   This chart was scribed for Carmin Muskrat, MD by Jeanell Sparrow, ED Scribe. This patient was seen in room MHT13/MHT13 and the patient's care was started at 10:47 PM.   Chief Complaint  Patient presents with  . Rash   The history is provided by the patient. No language interpreter was used.   HPI Comments: [place] [name] is a 33 y.o. male who presents to the Emergency Department complaining of a rash that started about a month ago. He states that the rash and itchiness is exacerbated with being in hot temperatures. He reports that the rash started off on his back and not it is on his neck, back, and abdomen. He reports no usual foods, products, or detergents. He denies any use of medication on a regular basis. He reports no recent travels. He states that he does not smoke. He reports that he is physically active. He denies any fever, chills, or nausea.   Past Medical History  Diagnosis Date  . Environmental allergies   . Shingles    Past Surgical History  Procedure Laterality Date  . Wrist surgery    . Wisdom tooth extraction     No family history on file. History  Substance Use Topics  . Smoking status: Never Smoker   . Smokeless tobacco: Never Used  . Alcohol Use: No    Review of Systems  Constitutional: Negative for fever and chills.       Per HPI, otherwise negative  HENT:       Per HPI, otherwise negative  Respiratory:       Per HPI, otherwise negative  Cardiovascular:       Per HPI, otherwise negative  Gastrointestinal: Negative for nausea and vomiting.  Endocrine:       Negative aside from HPI  Genitourinary:       Neg aside from HPI   Musculoskeletal:       Per HPI, otherwise negative  Skin: Positive for rash.  Neurological: Negative for syncope.    Allergies  Review of patient's allergies indicates no known allergies.  Home Medications   Prior to Admission medications   Medication Sig Start  Date End Date Taking? Authorizing Provider  cephALEXin (KEFLEX) 500 MG capsule Take 1 capsule (500 mg total) by mouth 2 (two) times daily. 02/14/14   Waldemar Dickens, MD  Cetirizine HCl (ZYRTEC ALLERGY PO) Take 1 tablet by mouth as needed.    Historical Provider, MD  fluticasone (FLONASE) 50 MCG/ACT nasal spray Place 2 sprays into both nostrils daily. 11/03/13   Marletta Lor, MD  predniSONE (DELTASONE) 10 MG tablet 3 tablets twice daily for 2 days, then 2 tablets twice daily for 2 days,  then 1 tablet twice daily for 2 days, then one tablet daily  for 6 days 11/03/13   Marletta Lor, MD   BP 156/93  Pulse 92  Temp(Src) 98.7 F (37.1 C) (Oral)  Resp 18  Ht 6\' 4"  (1.93 m)  Wt 242 lb (109.77 kg)  BMI 29.47 kg/m2  SpO2 99% Physical Exam  Nursing note and vitals reviewed. Constitutional: He is oriented to person, place, and time. He appears well-developed and well-nourished. No distress.  HENT:  Head: Normocephalic and atraumatic.  Neck: Neck supple. No thyromegaly present.  Pulmonary/Chest: Effort normal. No respiratory distress.  Musculoskeletal: Normal range of motion.  Neurological: He is alert and oriented to person, place, and  time.  Skin: Skin is warm and dry. Rash noted.  Whole superior part of back raised rash with areas of sclerosis and excoriations.  Rash is also on periumbilical area and upper abdomen, right upper chest, and left wrist.  Psychiatric: He has a normal mood and affect. His behavior is normal.    ED Course  Procedures (including critical care time) COORDINATION OF CARE: 10:51 PM- Pt advised of plan for treatment and pt agrees.   MDM   Final diagnoses:  Rash    I personally performed the services described in this documentation, which was scribed in my presence. The recorded information has been reviewed and is accurate.  This generally well young male presents with new rash.  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 I had a long conversation with the patient about empiric therapy with topical steroids continue to follow with dermatology, and he was discharged in stable condition.   Carmin Muskrat, MD 04/01/14 [phone]

## 2014-04-01 NOTE — ED Notes (Signed)
MD at bedside. 

## 2014-11-02 ENCOUNTER — Encounter (HOSPITAL_BASED_OUTPATIENT_CLINIC_OR_DEPARTMENT_OTHER): Payer: Self-pay | Admitting: *Deleted

## 2014-11-02 ENCOUNTER — Emergency Department (HOSPITAL_BASED_OUTPATIENT_CLINIC_OR_DEPARTMENT_OTHER)
Admission: EM | Admit: 2014-11-02 | Discharge: 2014-11-03 | Disposition: A | Discharge status: Home / Self Care | Payer: BLUE CROSS/BLUE SHIELD | Attending: Emergency Medicine | Admitting: Emergency Medicine

## 2014-11-02 DIAGNOSIS — Z8619 Personal history of other infectious and parasitic diseases: Secondary | ICD-10-CM | POA: Insufficient documentation

## 2014-11-02 DIAGNOSIS — K529 Noninfective gastroenteritis and colitis, unspecified: Secondary | ICD-10-CM | POA: Insufficient documentation

## 2014-11-02 DIAGNOSIS — Z7952 Long term (current) use of systemic steroids: Secondary | ICD-10-CM | POA: Diagnosis not present

## 2014-11-02 DIAGNOSIS — R1084 Generalized abdominal pain: Secondary | ICD-10-CM | POA: Diagnosis present

## 2014-11-02 LAB — CBC WITH DIFFERENTIAL/PLATELET
Basophils Absolute: 0 10*3/uL (ref 0.0–0.1)
Basophils Relative: 0 % (ref 0–1)
Eosinophils Absolute: 0.2 10*3/uL (ref 0.0–0.7)
Eosinophils Relative: 2 % (ref 0–5)
HCT: 42.7 % (ref 39.0–52.0)
Hemoglobin: 15 g/dL (ref 13.0–17.0)
Lymphocytes Relative: 24 % (ref 12–46)
Lymphs Abs: 1.7 10*3/uL (ref 0.7–4.0)
MCH: 29.5 pg (ref 26.0–34.0)
MCHC: 35.1 g/dL (ref 30.0–36.0)
MCV: 83.9 fL (ref 78.0–100.0)
Monocytes Absolute: 0.8 10*3/uL (ref 0.1–1.0)
Monocytes Relative: 11 % (ref 3–12)
Neutro Abs: 4.6 10*3/uL (ref 1.7–7.7)
Neutrophils Relative %: 63 % (ref 43–77)
Platelets: 277 10*3/uL (ref 150–400)
RBC: 5.09 MIL/uL (ref 4.22–5.81)
RDW: 13.3 % (ref 11.5–15.5)
WBC: 7.2 10*3/uL (ref 4.0–10.5)

## 2014-11-02 MED ORDER — DICYCLOMINE HCL 10 MG/ML IM SOLN
20.0000 mg | Freq: Once | INTRAMUSCULAR | Status: AC
  Administered 2014-11-02: 20 mg via INTRAMUSCULAR
  Filled 2014-11-02: qty 2

## 2014-11-02 MED ORDER — SODIUM CHLORIDE 0.9 % IV BOLUS (SEPSIS)
1000.0000 mL | Freq: Once | INTRAVENOUS | Status: AC
  Administered 2014-11-02: 1000 mL via INTRAVENOUS

## 2014-11-02 MED ORDER — ONDANSETRON HCL 4 MG/2ML IJ SOLN
4.0000 mg | Freq: Once | INTRAMUSCULAR | Status: AC
  Administered 2014-11-02: 4 mg via INTRAVENOUS
  Filled 2014-11-02: qty 2

## 2014-11-02 NOTE — ED Notes (Signed)
Abdominal pain x 4 days. Diarrhea and vomiting. He has been on an airplane today coming from [place].

## 2014-11-02 NOTE — ED Provider Notes (Signed)
CSN: [id number]     Arrival date & time 11/02/14  2157 History   First MD Initiated Contact with Patient 11/02/14 2253     Chief Complaint  Patient presents with  . Abdominal Pain     (Consider location/radiation/quality/duration/timing/severity/associated sxs/prior Treatment) Patient is a 34 y.o. male presenting with abdominal pain. The history is provided by the patient. No language interpreter was used.  Abdominal Pain Pain location:  Generalized Pain quality: cramping   Pain severity:  Moderate Onset quality:  Sudden Duration:  4 days Timing:  Constant Progression:  Unchanged Chronicity:  New Relieved by:  Nothing Worsened by:  Nothing tried Ineffective treatments:  None tried Associated symptoms: nausea   Associated symptoms: no dysuria and no fever   Risk factors: no alcohol abuse     Past Medical History  Diagnosis Date  . Environmental allergies   . Shingles    Past Surgical History  Procedure Laterality Date  . Wrist surgery    . Wisdom tooth extraction     No family history on file. History  Substance Use Topics  . Smoking status: Never Smoker   . Smokeless tobacco: Never Used  . Alcohol Use: No    Review of Systems  Constitutional: Negative for fever.  Gastrointestinal: Positive for nausea and abdominal pain.  Genitourinary: Negative for dysuria.  All other systems reviewed and are negative.     Allergies  Review of patient's allergies indicates no known allergies.  Home Medications   Prior to Admission medications   Medication Sig Start Date End Date Taking? Authorizing Provider  betamethasone dipropionate (DIPROLENE) 0.05 % ointment Apply topically 2 (two) times daily. 04/01/14   Carmin Muskrat, MD  cephALEXin (KEFLEX) 500 MG capsule Take 1 capsule (500 mg total) by mouth 2 (two) times daily. 02/14/14   Waldemar Dickens, MD  Cetirizine HCl (ZYRTEC ALLERGY PO) Take 1 tablet by mouth as needed.    Historical Provider, MD  fluticasone (FLONASE)  50 MCG/ACT nasal spray Place 2 sprays into both nostrils daily. 11/03/13   Marletta Lor, MD  predniSONE (DELTASONE) 10 MG tablet 3 tablets twice daily for 2 days, then 2 tablets twice daily for 2 days,  then 1 tablet twice daily for 2 days, then one tablet daily  for 6 days 11/03/13   Marletta Lor, MD   BP 139/95 mmHg  Pulse 93  Temp(Src) 98.3 F (36.8 C) (Oral)  Resp 20  Ht 6\' 4"  (1.93 m)  Wt 255 lb (115.667 kg)  BMI 31.05 kg/m2  SpO2 99% Physical Exam  Constitutional: He is oriented to person, place, and time. He appears well-developed and well-nourished.  Cardiovascular: Normal rate and regular rhythm.   Pulmonary/Chest: Effort normal and breath sounds normal.  Abdominal: Soft. Bowel sounds are normal. There is no tenderness.  Musculoskeletal: Normal range of motion.  Neurological: He is alert and oriented to person, place, and time.  Skin: Skin is warm and dry.  Psychiatric: He has a normal mood and affect.  Nursing note and vitals reviewed.   ED Course  Procedures (including critical care time) Labs Review Labs Reviewed - No data to display  Imaging Review No results found.   EKG Interpretation None      MDM   Final diagnoses:  Gastroenteritis    Abdomen benign on exam. Pt is tolerating po here. Discussed follow up precautions. Pt tolerating po here. Likely viral in nature  Glendell Docker, NP 11/03/14 [place], MD 11/05/14 [phone]

## 2014-11-03 LAB — COMPREHENSIVE METABOLIC PANEL
ALT: 14 U/L (ref 0–53)
AST: 22 U/L (ref 0–37)
Albumin: 2.9 g/dL — ABNORMAL LOW (ref 3.5–5.2)
Alkaline Phosphatase: 51 U/L (ref 39–117)
Anion gap: 6 (ref 5–15)
BUN: 9 mg/dL (ref 6–23)
CO2: 23 mmol/L (ref 19–32)
Calcium: 8.4 mg/dL (ref 8.4–10.5)
Chloride: 105 mmol/L (ref 96–112)
Creatinine, Ser: 1.12 mg/dL (ref 0.50–1.35)
GFR calc Af Amer: >90 mL/min (ref >90)
GFR calc non Af Amer: 86 mL/min — ABNORMAL LOW (ref >90)
Glucose, Bld: 110 mg/dL — ABNORMAL HIGH (ref 70–99)
Potassium: 3.1 mmol/L — ABNORMAL LOW (ref 3.5–5.1)
Sodium: 134 mmol/L — ABNORMAL LOW (ref 135–145)
Total Bilirubin: 0.4 mg/dL (ref 0.3–1.2)
Total Protein: 9 g/dL — ABNORMAL HIGH (ref 6.0–8.3)

## 2014-11-03 MED ORDER — DICYCLOMINE HCL 20 MG PO TABS: 20.0000 mg | ORAL_TABLET | Freq: Three times a day (TID) | ORAL | Status: DC

## 2014-11-03 MED ORDER — ONDANSETRON 4 MG PO TBDP: 4.0000 mg | ORAL_TABLET | Freq: Three times a day (TID) | ORAL | Status: DC | PRN

## 2014-11-03 NOTE — Discharge Instructions (Signed)
Gastritis, Adult °Gastritis is soreness and puffiness (inflammation) of the lining of the stomach. If you do not get help, gastritis can cause bleeding and sores (ulcers) in the stomach. °HOME CARE  °· Only take medicine as told by your doctor. °· If you were given antibiotic medicines, take them as told. Finish the medicines even if you start to feel better. °· Drink enough fluids to keep your pee (urine) clear or pale yellow. °· Avoid foods and drinks that make your problems worse. Foods you may want to avoid include: °¨ Caffeine or alcohol. °¨ Chocolate. °¨ Mint. °¨ Garlic and onions. °¨ Spicy foods. °¨ Citrus fruits, including oranges, lemons, or limes. °¨ Food containing tomatoes, including sauce, chili, salsa, and pizza. °¨ Fried and fatty foods. °· Eat small meals throughout the day instead of large meals. °GET HELP RIGHT AWAY IF:  °· You have black or dark red poop (stools). °· You throw up (vomit) blood. It may look like coffee grounds. °· You cannot keep fluids down. °· Your belly (abdominal) pain gets worse. °· You have a fever. °· You do not feel better after 1 week. °· You have any other questions or concerns. °MAKE SURE YOU:  °· Understand these instructions. °· Will watch your condition. °· Will get help right away if you are not doing well or get worse. °Document Released: 12/27/2007 Document Revised: 10/02/2011 Document Reviewed: 08/23/2011 °[employer]® Patient Information ©2015 [employer]. This information is not intended to replace advice given to you by your health care provider. Make sure you discuss any questions you have with your health care provider. ° °

## 2014-11-04 ENCOUNTER — Encounter (HOSPITAL_COMMUNITY): Payer: Self-pay | Admitting: Emergency Medicine

## 2014-11-04 ENCOUNTER — Emergency Department (HOSPITAL_COMMUNITY)
Admission: EM | Admit: 2014-11-04 | Discharge: 2014-11-04 | Disposition: A | Discharge status: Home / Self Care | Payer: BLUE CROSS/BLUE SHIELD | Source: Home / Self Care | Attending: Emergency Medicine | Admitting: Emergency Medicine

## 2014-11-04 ENCOUNTER — Emergency Department (INDEPENDENT_AMBULATORY_CARE_PROVIDER_SITE_OTHER): Payer: BLUE CROSS/BLUE SHIELD

## 2014-11-04 DIAGNOSIS — R1084 Generalized abdominal pain: Secondary | ICD-10-CM

## 2014-11-04 LAB — POCT H PYLORI SCREEN: H. PYLORI SCREEN, POC: NEGATIVE

## 2014-11-04 MED ORDER — PEG 3350-KCL-NABCB-NACL-NASULF 236 G PO SOLR: 4000.0000 mL | Freq: Once | ORAL | Status: DC

## 2014-11-04 MED ORDER — OMEPRAZOLE 20 MG PO CPDR: 20.0000 mg | DELAYED_RELEASE_CAPSULE | Freq: Every day | ORAL | Status: DC

## 2014-11-04 NOTE — Discharge Instructions (Signed)
The x-ray shows some stool in your colon. We are going to treat you for constipation and see if that resolves her symptoms. Drink a glass of the GoLYTELY every 10 minutes until gone or your stool runs clear. Use the Zofran as needed for nausea. Take omeprazole daily to help with the pain. You can stop the dicyclomine. If your abdominal pain gets worse, UR vomiting uncontrollably, your blood in your stool, you develop fevers, please go to the emergency room. If your symptoms do not improve after the GoLYTELY, please follow-up with the gastroenterologist.

## 2014-11-04 NOTE — ED Notes (Signed)
Pt has been suffering from abdominal for two weeks along with watery, yellow diarrhea.  Pt states the pain starts in his upper abdomen and moves down to the umbilicus.  Pt reports having to double over from the pain and using breathing techniques to help with the pain.

## 2014-11-04 NOTE — ED Provider Notes (Signed)
CSN: [id number]     Arrival date & time 11/04/14  1311 History   First MD Initiated Contact with Patient 11/04/14 1330     Chief Complaint  Patient presents with  . Abdominal Pain   (Consider location/radiation/quality/duration/timing/severity/associated sxs/prior Treatment) HPI  He is a 34 year old man here for evaluation of abdominal pain. He states his symptoms started about 2 weeks ago with watery yellow stool. Then, about one week ago he started having intermittent abdominal pain. The abdominal pain has gotten progressively more severe. It comes and goes. It seems to be triggered by eating anything or drinking liquids. The pain starts in the upper part of his abdomen and will radiate throughout the whole abdomen. He states he will use Lamaze breathing, curling up in the fetal position, or walking around to try and control the pain. He does report some mild intermittent nausea. No vomiting. No fevers. He was seen in the emergency room the evening of the 11th and diagnosed with a likely viral process. He was started on Zofran and dicyclomine.  He states this makes him feel worse.  No blood in the stool. No history of constipation or heartburn.  Past Medical History  Diagnosis Date  . Environmental allergies   . Shingles    Past Surgical History  Procedure Laterality Date  . Wrist surgery    . Wisdom tooth extraction     History reviewed. No pertinent family history. History  Substance Use Topics  . Smoking status: Never Smoker   . Smokeless tobacco: Never Used  . Alcohol Use: No    Review of Systems  Constitutional: Positive for appetite change. Negative for fever and chills.  HENT: Negative.   Respiratory: Negative.   Cardiovascular: Negative.   Gastrointestinal: Positive for nausea, abdominal pain and diarrhea. Negative for vomiting and blood in stool.    Allergies  Review of patient's allergies indicates no known allergies.  Home Medications   Prior to Admission  medications   Medication Sig Start Date End Date Taking? Authorizing Provider  Cetirizine HCl (ZYRTEC ALLERGY PO) Take 1 tablet by mouth as needed.   Yes Historical Provider, MD  dicyclomine (BENTYL) 20 MG tablet Take 1 tablet (20 mg total) by mouth 3 (three) times daily before meals. 11/03/14  Yes Glendell Docker, NP  ondansetron (ZOFRAN ODT) 4 MG disintegrating tablet Take 1 tablet (4 mg total) by mouth every 8 (eight) hours as needed for nausea or vomiting. 11/03/14  Yes Glendell Docker, NP  omeprazole (PRILOSEC) 20 MG capsule Take 1 capsule (20 mg total) by mouth daily. 11/04/14   Melony Overly, MD  polyethylene glycol (GOLYTELY) 236 G solution Take 4,000 mLs by mouth once. Drink a glass every 10 minutes until stool is clear or gone. 11/04/14   Melony Overly, MD   BP 146/81 mmHg  Pulse 97  Temp(Src) 98.5 F (36.9 C) (Oral)  Resp 18  SpO2 96% Physical Exam  Constitutional: He is oriented to person, place, and time. He appears well-developed and well-nourished. He appears distressed (looks uncomfortable).  Neck: Neck supple.  Cardiovascular: Normal rate, regular rhythm and normal heart sounds.   No murmur heard. Pulmonary/Chest: Effort normal and breath sounds normal. No respiratory distress. He has no wheezes. He has no rales.  Abdominal: Soft. Bowel sounds are increased. There is generalized tenderness. There is no rigidity, no rebound and no guarding.  Neurological: He is alert and oriented to person, place, and time.    ED Course  Procedures (including critical  care time) Labs Review Labs Reviewed  POCT H PYLORI SCREEN    Imaging Review Dg Abd Acute W/chest  11/04/2014   CLINICAL DATA:  Abdominal pain and vomiting for 4 days. Diarrhea for 2 weeks.  EXAM: DG ABDOMEN ACUTE W/ 1V CHEST  COMPARISON:  Chest radiograph February 27, 2008; CT abdomen and pelvis Dec 13, 2009  FINDINGS: PA chest: There is no edema or consolidation. Heart size and pulmonary vascularity are normal. No adenopathy.   Supine and upright abdomen: There is mild stool in the colon. There is no bowel dilatation or air-fluid level suggesting obstruction. No free air. No abnormal calcifications.  IMPRESSION: Bowel gas pattern unremarkable. No demonstrable obstruction or free air. Lungs clear.   Electronically Signed   By: Lowella Grip III M.D.   On: 11/04/2014 14:43     MDM   1. Generalized abdominal pain    X-ray raises concern for a stool ball within the colon. We'll treat with GoLYTELY and omeprazole. Recommend he stop the dicyclomine. Okay to use Zofran as needed. Return precautions reviewed. Follow-up with gastroenterology if no improvement after treatment.    Melony Overly, MD 11/04/14 [phone]

## 2015-01-30 ENCOUNTER — Encounter (HOSPITAL_COMMUNITY): Payer: Self-pay | Admitting: Emergency Medicine

## 2015-01-30 ENCOUNTER — Emergency Department (HOSPITAL_COMMUNITY)
Admission: EM | Admit: 2015-01-30 | Discharge: 2015-01-30 | Disposition: A | Discharge status: Home / Self Care | Payer: BLUE CROSS/BLUE SHIELD | Source: Home / Self Care

## 2015-01-30 DIAGNOSIS — L200 Besnier's prurigo: Secondary | ICD-10-CM | POA: Diagnosis not present

## 2015-01-30 DIAGNOSIS — N489 Disorder of penis, unspecified: Secondary | ICD-10-CM

## 2015-01-30 DIAGNOSIS — T7840XA Allergy, unspecified, initial encounter: Secondary | ICD-10-CM

## 2015-01-30 DIAGNOSIS — R21 Rash and other nonspecific skin eruption: Secondary | ICD-10-CM

## 2015-01-30 DIAGNOSIS — L239 Allergic contact dermatitis, unspecified cause: Secondary | ICD-10-CM

## 2015-01-30 MED ORDER — FLUCONAZOLE 150 MG PO TABS: 150.0000 mg | ORAL_TABLET | Freq: Once | ORAL | Status: DC

## 2015-01-30 NOTE — ED Notes (Signed)
Co penis swelling for four days now Did change soaps

## 2015-01-30 NOTE — ED Provider Notes (Signed)
CSN: [id number]     Arrival date & time 01/30/15  1703 History   First MD Initiated Contact with Patient 01/30/15 1813     Chief Complaint  Patient presents with  . Groin Swelling   (Consider location/radiation/quality/duration/timing/severity/associated sxs/prior Treatment) The history is provided by the patient.   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 He comes in with penile swelling of about 4 days' duration. He's been using Neosporin and peroxide with no benefit. He has no penile discharge or pain.  Patient's last sexual encounter was in March.  Patient's had the same problem occur when he was 34 years old. He's been circumcised.  Past Medical History  Diagnosis Date  . Environmental allergies   . Shingles    Past Surgical History  Procedure Laterality Date  . Wrist surgery    . Wisdom tooth extraction     History reviewed. No pertinent family history. History  Substance Use Topics  . Smoking status: Never Smoker   . Smokeless tobacco: Never Used  . Alcohol Use: No    Review of Systems  Constitutional: Negative.   HENT: Negative.   Eyes: Negative.   Respiratory: Negative.   Cardiovascular: Negative.   Gastrointestinal: Negative.   Endocrine: Negative.   Genitourinary: Positive for penile swelling and genital sores. Negative for dysuria, urgency, frequency, hematuria, flank pain, decreased urine volume, discharge, scrotal swelling, enuresis, difficulty urinating, penile pain and testicular pain.  Musculoskeletal: Negative.   Skin:       Patient notes weepy watery area around the corona of his penis    Allergies  Review of patient's allergies indicates no known allergies.  Home Medications   Prior to Admission medications   Medication Sig Start Date End Date Taking? Authorizing Provider  Cetirizine HCl (ZYRTEC ALLERGY PO) Take 1 tablet by mouth as needed.    Historical Provider, MD  dicyclomine (BENTYL) 20 MG tablet Take 1  tablet (20 mg total) by mouth 3 (three) times daily before meals. 11/03/14   Glendell Docker, NP  omeprazole (PRILOSEC) 20 MG capsule Take 1 capsule (20 mg total) by mouth daily. 11/04/14   Melony Overly, MD  ondansetron (ZOFRAN ODT) 4 MG disintegrating tablet Take 1 tablet (4 mg total) by mouth every 8 (eight) hours as needed for nausea or vomiting. 11/03/14   Glendell Docker, NP  polyethylene glycol (GOLYTELY) 236 G solution Take 4,000 mLs by mouth once. Drink a glass every 10 minutes until stool is clear or gone. 11/04/14   Melony Overly, MD   BP 155/95 mmHg  Pulse 101  Temp(Src) 99 F (37.2 C) (Oral)  Resp 16  SpO2 99% Physical Exam  Constitutional: He is oriented to person, place, and time. He appears well-developed and well-nourished.  Abdominal: Soft. There is no tenderness.  Genitourinary:  Patient has swollen foreskin, macerated corona and macerated intersection of the foreskin with the corona. He has no discharge. Is no penile pain or ulceration.  Musculoskeletal: He exhibits no edema or tenderness.  Neurological: He is alert and oriented to person, place, and time.  Psychiatric: He has a normal mood and affect. His behavior is normal. Judgment and thought content normal.    ED Course  Procedures (including critical care time) Labs Review Labs Reviewed - No data to display  Imaging Review No results found.   MDM   I believe the problem the patient has his accommodation of yeast infection with skin reaction to peroxide and Neosporin.  He's been felt to discontinue the latter to and take Diflucan.  He should return if symptoms are not clearing in the next 3-4 days.  Signed, Robyn Haber M.D.  Robyn Haber, MD 01/30/15 [id number]

## 2015-01-30 NOTE — Discharge Instructions (Signed)
Discontinue the use of Neosporin and peroxide. These can be very harsh on the skin and many people do develop an allergy to the Neosporin.  Instead use plain soap and water as needed and take the Diflucan one time to start the healing process.  Please return if symptoms do not improve in the next 3 days.

## 2015-02-13 ENCOUNTER — Encounter (HOSPITAL_COMMUNITY): Payer: Self-pay | Admitting: *Deleted

## 2015-02-13 ENCOUNTER — Emergency Department (HOSPITAL_COMMUNITY)
Admission: EM | Admit: 2015-02-13 | Discharge: 2015-02-13 | Disposition: A | Discharge status: Home / Self Care | Payer: BLUE CROSS/BLUE SHIELD | Source: Home / Self Care | Attending: Family Medicine | Admitting: Family Medicine

## 2015-02-13 DIAGNOSIS — B3742 Candidal balanitis: Secondary | ICD-10-CM | POA: Diagnosis not present

## 2015-02-13 MED ORDER — FLUCONAZOLE 150 MG PO TABS: ORAL_TABLET | ORAL | Status: DC

## 2015-02-13 MED ORDER — CLOTRIMAZOLE-BETAMETHASONE 1-0.05 % EX CREA: TOPICAL_CREAM | CUTANEOUS | Status: DC

## 2015-02-13 NOTE — ED Provider Notes (Signed)
CSN: [id number]     Arrival date & time 02/13/15  1831 History   First MD Initiated Contact with Patient 02/13/15 1936     Chief Complaint  Patient presents with  . Groin Swelling   (Consider location/radiation/quality/duration/timing/severity/associated sxs/prior Treatment) HPI Comments: This male patient was seen in the urgent care on July the night for Candida balanitis is treated with Diflucan. He states his symptoms resolved however couple days ago he developed a rash to the glands of the penis as well has swelling of the foreskin.   Past Medical History  Diagnosis Date  . Environmental allergies   . Shingles    Past Surgical History  Procedure Laterality Date  . Wrist surgery    . Wisdom tooth extraction     No family history on file. History  Substance Use Topics  . Smoking status: Never Smoker   . Smokeless tobacco: Never Used  . Alcohol Use: No    Review of Systems  Constitutional: Negative.   Genitourinary: Positive for penile swelling and penile pain. Negative for dysuria, urgency, flank pain, scrotal swelling, genital sores and testicular pain.  Musculoskeletal: Negative.   All other systems reviewed and are negative.   Allergies  Review of patient's allergies indicates no known allergies.  Home Medications   Prior to Admission medications   Medication Sig Start Date End Date Taking? Authorizing Provider  Cetirizine HCl (ZYRTEC ALLERGY PO) Take 1 tablet by mouth as needed.    Historical Provider, MD  clotrimazole-betamethasone (LOTRISONE) cream Apply to affected area 2 times daily prn 02/13/15   Janne Napoleon, NP  fluconazole (DIFLUCAN) 150 MG tablet 1 tab po x 1. May repeat in 72 hours if no improvement 02/13/15   Janne Napoleon, NP   BP 171/94 mmHg  Pulse 79  Temp(Src) 99.2 F (37.3 C) (Oral)  Resp 16  SpO2 99% Physical Exam  Constitutional: He is oriented to person, place, and time. He appears well-developed and well-nourished. No distress.  Neck: Normal  range of motion. Neck supple.  Pulmonary/Chest: Effort normal. No respiratory distress.  Genitourinary:  There is swelling of the foreskin over the glans. There is a candidal type rash to the distal shaft and the corona of the glans.  Neurological: He is alert and oriented to person, place, and time.  Skin: Skin is warm.  Psychiatric: He has a normal mood and affect.  Nursing note and vitals reviewed.   ED Course  Procedures (including critical care time) Labs Review Labs Reviewed - No data to display  Imaging Review No results found.   MDM   1. Candidal balanitis    Diflucan 150 mg x 2 tabs lotrisone cream bid See your doctor for f/u    Janne Napoleon, NP 02/13/15 [id number]

## 2015-02-13 NOTE — ED Notes (Signed)
Was seen and treated 7/9 for same sxs - was given Rx for Diflucan with symptoms completely resolved.  Yesterday pt starts he started again with penile swelling and discomfort.

## 2015-02-13 NOTE — Discharge Instructions (Signed)
Candida Infection A Candida infection (also called yeast, fungus, and Monilia infection) is an overgrowth of yeast that can occur anywhere on the body. A yeast infection commonly occurs in warm, moist body areas. Usually, the infection remains localized but can spread to become a systemic infection. A yeast infection may be a sign of a more severe disease such as diabetes, leukemia, or AIDS. A yeast infection can occur in both men and women. In women, Candida vaginitis is a vaginal infection. It is one of the most common causes of vaginitis. Men usually do not have symptoms or know they have an infection until other problems develop. Men may find out they have a yeast infection because their sex partner has a yeast infection. Uncircumcised men are more likely to get a yeast infection than circumcised men. This is because the uncircumcised glans is not exposed to air and does not remain as dry as that of a circumcised glans. Older adults may develop yeast infections around dentures. CAUSES  Women  Antibiotics.  Steroid medication taken for a long time.  Being overweight (obese).  Diabetes.  Poor immune condition.  Certain serious medical conditions.  Immune suppressive medications for organ transplant patients.  Chemotherapy.  Pregnancy.  Menstruation.  Stress and fatigue.  Intravenous drug use.  Oral contraceptives.  Wearing tight-fitting clothes in the crotch area.  Catching it from a sex partner who has a yeast infection.  Spermicide.  Intravenous, urinary, or other catheters. Men  Catching it from a sex partner who has a yeast infection.  Having oral or anal sex with a person who has the infection.  Spermicide.  Diabetes.  Antibiotics.  Poor immune system.  Medications that suppress the immune system.  Intravenous drug use.  Intravenous, urinary, or other catheters. SYMPTOMS  Women  Thick, white vaginal discharge.  Vaginal itching.  Redness and  swelling in and around the vagina.  Irritation of the lips of the vagina and perineum.  Blisters on the vaginal lips and perineum.  Painful sexual intercourse.  Low blood sugar (hypoglycemia).  Painful urination.  Bladder infections.  Intestinal problems such as constipation, indigestion, bad breath, bloating, increase in gas, diarrhea, or loose stools. Men  Men may develop intestinal problems such as constipation, indigestion, bad breath, bloating, increase in gas, diarrhea, or loose stools.  Dry, cracked skin on the penis with itching or discomfort.  Jock itch.  Dry, flaky skin.  Athlete's foot.  Hypoglycemia. DIAGNOSIS  Women  A history and an exam are performed.  The discharge may be examined under a microscope.  A culture may be taken of the discharge. Men  A history and an exam are performed.  Any discharge from the penis or areas of cracked skin will be looked at under the microscope and cultured.  Stool samples may be cultured. TREATMENT  Women  Vaginal antifungal suppositories and creams.  Medicated creams to decrease irritation and itching on the outside of the vagina.  Warm compresses to the perineal area to decrease swelling and discomfort.  Oral antifungal medications.  Medicated vaginal suppositories or cream for repeated or recurrent infections.  Wash and dry the irritation areas before applying the cream.  Eating yogurt with Lactobacillus may help with prevention and treatment.  Sometimes painting the vagina with gentian violet solution may help if creams and suppositories do not work. Men  Antifungal creams and oral antifungal medications.  Sometimes treatment must continue for 30 days after the symptoms go away to prevent recurrence. HOME CARE INSTRUCTIONS  Women  Use cotton underwear and avoid tight-fitting clothing.  Avoid colored, scented toilet paper and deodorant tampons or pads.  Do not douche.  Keep your diabetes  under control.  Finish all the prescribed medications.  Keep your skin clean and dry.  Consume milk or yogurt with Lactobacillus-active culture regularly. If you get frequent yeast infections and think that is what the infection is, there are over-the-counter medications that you can get. If the infection does not show healing in 3 days, talk to your caregiver.  Tell your sex partner you have a yeast infection. Your partner may need treatment also, especially if your infection does not clear up or recurs. Men  Keep your skin clean and dry.  Keep your diabetes under control.  Finish all prescribed medications.  Tell your sex partner that you have a yeast infection so he or she can be treated if necessary. SEEK MEDICAL CARE IF:   Your symptoms do not clear up or worsen in one week after treatment.  You have an oral temperature above 102 F (38.9 C).  You have trouble swallowing or eating for a prolonged time.  You develop blisters on and around your vagina.  You develop vaginal bleeding and it is not your menstrual period.  You develop abdominal pain.  You develop intestinal problems as mentioned above.  You get weak or light-headed.  You have painful or increased urination.  You have pain during sexual intercourse. MAKE SURE YOU:   Understand these instructions.  Will watch your condition.  Will get help right away if you are not doing well or get worse. Document Released: 08/17/2004 Document Revised: 11/24/2013 Document Reviewed: 11/29/2009 [facility] Patient Information 2015 [place], [place]. This information is not intended to replace advice given to you by your health care provider. Make sure you discuss any questions you have with your health care provider.  Balanitis Balanitis is inflammation of the head of the penis (glans).  CAUSES  Balanitis has multiple causes, both infectious and noninfectious. Often balanitis is the result of poor personal hygiene,  especially in uncircumcised males. Without adequate washing, viruses, bacteria, and yeast collect between the foreskin and the glans. This can cause an infection. Lack of air and irritation from a normal secretion called smegma contribute to the cause in uncircumcised males. Other causes include:  Chemical irritation from the use of certain soaps and shower gels (especially soaps with perfumes), condoms, personal lubricants, petroleum jelly, spermicides, and fabric conditioners.  Skin conditions, such as eczema, dermatitis, and psoriasis.  Allergies to drugs, such as tetracycline and sulfa.  Certain medical conditions, including liver cirrhosis, congestive heart failure, and kidney disease.  Morbid obesity. RISK FACTORS  Diabetes mellitus.  A tight foreskin that is difficult to pull back past the glans (phimosis).  Sex without the use of a condom. SIGNS AND SYMPTOMS  Symptoms may include:  Discharge coming from under the foreskin.  Tenderness.  Itching and inability to get an erection (because of the pain).  Redness and a rash.  Sores on the glans and on the foreskin. DIAGNOSIS Diagnosis of balanitis is confirmed through a physical exam. TREATMENT The treatment is based on the cause of the balanitis. Treatment may include:  Frequent cleansing.  Keeping the glans and foreskin dry.  Use of medicines such as creams, pain medicines, antibiotics, or medicines to treat fungal infections.  Sitz baths. If the irritation has caused a scar on the foreskin that prevents easy retraction, a circumcision may be recommended.  HOME  CARE INSTRUCTIONS  Sex should be avoided until the condition has cleared. MAKE SURE YOU:  Understand these instructions.  Will watch your condition.  Will get help right away if you are not doing well or get worse. Document Released: 11/26/2008 Document Revised: 07/15/2013 Document Reviewed: 12/30/2012 [facility] Patient Information 2015 [place],  [place]. This information is not intended to replace advice given to you by your health care provider. Make sure you discuss any questions you have with your health care provider.

## 2015-04-04 ENCOUNTER — Encounter (HOSPITAL_COMMUNITY): Payer: Self-pay | Admitting: Emergency Medicine

## 2015-04-04 ENCOUNTER — Emergency Department (HOSPITAL_COMMUNITY)
Admission: EM | Admit: 2015-04-04 | Discharge: 2015-04-04 | Disposition: A | Discharge status: Home / Self Care | Payer: BLUE CROSS/BLUE SHIELD | Source: Home / Self Care | Attending: Family Medicine | Admitting: Family Medicine

## 2015-04-04 DIAGNOSIS — L259 Unspecified contact dermatitis, unspecified cause: Secondary | ICD-10-CM

## 2015-04-04 MED ORDER — PREDNISONE 20 MG PO TABS: ORAL_TABLET | ORAL | Status: DC

## 2015-04-04 MED ORDER — FLUOCINONIDE-E 0.05 % EX CREA: 1.0000 "application " | TOPICAL_CREAM | Freq: Two times a day (BID) | CUTANEOUS | Status: DC

## 2015-04-04 NOTE — ED Provider Notes (Signed)
CSN: [id number]     Arrival date & time 04/04/15  1300 History   First MD Initiated Contact with Patient 04/04/15 1330     No chief complaint on file.  (Consider location/radiation/quality/duration/timing/severity/associated sxs/prior Treatment) The history is provided by the patient.   this is a 34 year old man who lives in a hotel and moves from location to location for work. He has a chronic left wrist rash, a hypogastric rash and a chest rash all corresponding to jewelry or buccal studies been wearing. He is discontinued his belt buckle and neck chain. He still has wrist Gold chains. He says that he start H everywhere and he wanted to make sure he did not have a contagious or infectious rash.  Past Medical History  Diagnosis Date  . Environmental allergies   . Shingles    Past Surgical History  Procedure Laterality Date  . Wrist surgery    . Wisdom tooth extraction     No family history on file. Social History  Substance Use Topics  . Smoking status: Never Smoker   . Smokeless tobacco: Never Used  . Alcohol Use: No    Review of Systems  Allergies  Review of patient's allergies indicates no known allergies.  Home Medications   Prior to Admission medications   Medication Sig Start Date End Date Taking? Authorizing Provider  Cetirizine HCl (ZYRTEC ALLERGY PO) Take 1 tablet by mouth as needed.    Historical Provider, MD  clotrimazole-betamethasone (LOTRISONE) cream Apply to affected area 2 times daily prn 02/13/15   Janne Napoleon, NP  fluconazole (DIFLUCAN) 150 MG tablet 1 tab po x 1. May repeat in 72 hours if no improvement 02/13/15   Janne Napoleon, NP   Meds Ordered and Administered this Visit  Medications - No data to display  BP 154/94 mmHg  Pulse 95  Temp(Src) 99.7 F (37.6 C) (Oral)  Resp 18  SpO2 95% No data found.   Physical Exam  Constitutional: He appears well-developed and well-nourished.  Skin:  Patient has a hyper pigmented, thickened, eczematous rash on  his left wrist corresponding to the gold chains. He also has a 4 cm area on his hypogastrium that is thick, eczematous as well and this corresponds to wear a belt buckle would be worn.  Patient does have some hyperpigmented papules with induration on his chest corresponding to a necklace.  Nursing note and vitals reviewed.   ED Course  Procedures (including critical care time)   MDM      ICD-9-CM ICD-10-CM   1. Contact dermatitis 692.9 L25.9 predniSONE (DELTASONE) 20 MG tablet     fluocinonide-emollient (LIDEX-E) 0.05 % cream     Signed, Robyn Haber, MD     Robyn Haber, MD 04/04/15 1341

## 2015-04-04 NOTE — Discharge Instructions (Signed)

## 2015-04-04 NOTE — ED Notes (Signed)
Rash, reports living in a variety of places, multiple motels.

## 2015-10-15 DIAGNOSIS — H6521 Chronic serous otitis media, right ear: Secondary | ICD-10-CM | POA: Insufficient documentation

## 2015-10-15 DIAGNOSIS — H9212 Otorrhea, left ear: Secondary | ICD-10-CM | POA: Insufficient documentation

## 2015-10-15 DIAGNOSIS — H906 Mixed conductive and sensorineural hearing loss, bilateral: Secondary | ICD-10-CM | POA: Insufficient documentation

## 2015-10-15 DIAGNOSIS — H6993 Unspecified Eustachian tube disorder, bilateral: Secondary | ICD-10-CM | POA: Insufficient documentation

## 2015-11-22 DIAGNOSIS — H7292 Unspecified perforation of tympanic membrane, left ear: Secondary | ICD-10-CM | POA: Insufficient documentation

## 2015-11-22 DIAGNOSIS — Z9622 Myringotomy tube(s) status: Secondary | ICD-10-CM | POA: Insufficient documentation

## 2015-11-22 DIAGNOSIS — H906 Mixed conductive and sensorineural hearing loss, bilateral: Secondary | ICD-10-CM | POA: Diagnosis not present

## 2015-11-22 DIAGNOSIS — H6983 Other specified disorders of Eustachian tube, bilateral: Secondary | ICD-10-CM | POA: Diagnosis not present

## 2015-11-22 DIAGNOSIS — H7202 Central perforation of tympanic membrane, left ear: Secondary | ICD-10-CM | POA: Diagnosis not present

## 2015-11-26 DIAGNOSIS — H900 Conductive hearing loss, bilateral: Secondary | ICD-10-CM | POA: Diagnosis not present

## 2015-11-26 DIAGNOSIS — H7292 Unspecified perforation of tympanic membrane, left ear: Secondary | ICD-10-CM | POA: Diagnosis not present

## 2015-11-26 DIAGNOSIS — H7202 Central perforation of tympanic membrane, left ear: Secondary | ICD-10-CM | POA: Diagnosis not present

## 2015-11-26 DIAGNOSIS — H6982 Other specified disorders of Eustachian tube, left ear: Secondary | ICD-10-CM | POA: Diagnosis not present

## 2016-01-13 DIAGNOSIS — H6982 Other specified disorders of Eustachian tube, left ear: Secondary | ICD-10-CM | POA: Diagnosis not present

## 2016-01-13 DIAGNOSIS — Z9622 Myringotomy tube(s) status: Secondary | ICD-10-CM | POA: Diagnosis not present

## 2016-01-13 DIAGNOSIS — H93292 Other abnormal auditory perceptions, left ear: Secondary | ICD-10-CM | POA: Diagnosis not present

## 2016-03-22 DIAGNOSIS — L209 Atopic dermatitis, unspecified: Secondary | ICD-10-CM | POA: Diagnosis not present

## 2016-04-05 DIAGNOSIS — S0183XA Puncture wound without foreign body of other part of head, initial encounter: Secondary | ICD-10-CM | POA: Diagnosis not present

## 2016-06-26 DIAGNOSIS — R21 Rash and other nonspecific skin eruption: Secondary | ICD-10-CM | POA: Diagnosis not present

## 2016-06-26 DIAGNOSIS — L209 Atopic dermatitis, unspecified: Secondary | ICD-10-CM | POA: Diagnosis not present

## 2016-07-24 DIAGNOSIS — B20 Human immunodeficiency virus [HIV] disease: Secondary | ICD-10-CM

## 2016-07-24 DIAGNOSIS — Z21 Asymptomatic human immunodeficiency virus [HIV] infection status: Secondary | ICD-10-CM

## 2016-07-24 HISTORY — DX: Asymptomatic human immunodeficiency virus (hiv) infection status: Z21

## 2016-07-24 HISTORY — DX: Human immunodeficiency virus (HIV) disease: B20

## 2016-08-03 ENCOUNTER — Ambulatory Visit (INDEPENDENT_AMBULATORY_CARE_PROVIDER_SITE_OTHER): Payer: BLUE CROSS/BLUE SHIELD | Admitting: Adult Health

## 2016-08-03 ENCOUNTER — Encounter: Payer: Self-pay | Admitting: Adult Health

## 2016-08-03 VITALS — BP 182/98 | Temp 98.2°F | Ht 76.0 in | Wt 253.8 lb

## 2016-08-03 DIAGNOSIS — I10 Essential (primary) hypertension: Secondary | ICD-10-CM | POA: Diagnosis not present

## 2016-08-03 DIAGNOSIS — Z7689 Persons encountering health services in other specified circumstances: Secondary | ICD-10-CM | POA: Diagnosis not present

## 2016-08-03 DIAGNOSIS — H7292 Unspecified perforation of tympanic membrane, left ear: Secondary | ICD-10-CM | POA: Diagnosis not present

## 2016-08-03 DIAGNOSIS — L309 Dermatitis, unspecified: Secondary | ICD-10-CM

## 2016-08-03 DIAGNOSIS — Z21 Asymptomatic human immunodeficiency virus [HIV] infection status: Secondary | ICD-10-CM

## 2016-08-03 DIAGNOSIS — H6983 Other specified disorders of Eustachian tube, bilateral: Secondary | ICD-10-CM | POA: Diagnosis not present

## 2016-08-03 DIAGNOSIS — Z9622 Myringotomy tube(s) status: Secondary | ICD-10-CM | POA: Diagnosis not present

## 2016-08-03 LAB — BASIC METABOLIC PANEL
BUN: 14 mg/dL (ref 6–23)
CO2: 26 mEq/L (ref 19–32)
Calcium: 8.6 mg/dL (ref 8.4–10.5)
Chloride: 105 mEq/L (ref 96–112)
Creatinine, Ser: 1.33 mg/dL (ref 0.40–1.50)
GFR: 65.63 mL/min (ref >60.00)
Glucose, Bld: 80 mg/dL (ref 70–99)
Potassium: 3.8 mEq/L (ref 3.5–5.1)
Sodium: 135 mEq/L (ref 135–145)

## 2016-08-03 MED ORDER — LISINOPRIL 20 MG PO TABS: 20.0000 mg | ORAL_TABLET | Freq: Every day | ORAL | 3 refills | Status: DC

## 2016-08-03 MED ORDER — TRIAMCINOLONE ACETONIDE 0.1 % EX CREA: 1.0000 "application " | TOPICAL_CREAM | Freq: Two times a day (BID) | CUTANEOUS | 0 refills | Status: DC

## 2016-08-03 MED ORDER — CETIRIZINE HCL 10 MG PO TABS: 10.0000 mg | ORAL_TABLET | Freq: Every day | ORAL | 3 refills | Status: DC

## 2016-08-03 NOTE — Progress Notes (Signed)
Patient presents to clinic today to establish care. He is a pleasant 36 year old male who  has a past medical history of Environmental allergies and Shingles.  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 He has a family history with both mom and dad of having hypertension. He denies ever being on any antihypertensive medications in the past although a past office visits his blood pressure has been elevated. He denies any headaches, blurred vision, or dizziness. BP Readings from Last 3 Encounters:  08/03/16 (!) 182/98  04/04/15 154/94  02/13/15 171/94   HIV Positive Test - Currently he is followed for HIV disease and Lee [name] at [facility] after a positive HIV test that was not sensitive in June 2011 (?). Lee [name] is not a good historian when it comes to this "they took 7 vials of blood on day and then I want back another day and he took another 7 vials of blood and I never heard anything after that." He denies ever being on any anti-retroviral therapy in the past has not seen anybody [facility] for many years at this point in time, this is partially for having no health insurance after losing his job with [employer].  Health Maintenance: Dental -- Does not do routine  Vision -- Does not do routine  Immunizations -- Needs Tdap  Diet: Tries to eat healthy. He does not eat a lot of junk food or pork. He has cut back on fried foods and is baked foods  Exercise: He likes to do cardio   He is followed by  - ENT at [facility].    BP Readings from Last 3 Encounters:  08/03/16 (!) 182/98  04/04/15 154/94  02/13/15 171/94     Past Medical History:  Diagnosis Date  . Environmental allergies   . Shingles     Past Surgical History:  Procedure Laterality Date  . WISDOM TOOTH EXTRACTION     . WRIST SURGERY      Current Outpatient Prescriptions on File Prior to Visit  Medication Sig Dispense Refill  . Cetirizine HCl (ZYRTEC ALLERGY PO) Take 1 tablet by mouth as needed.    . clotrimazole-betamethasone (LOTRISONE) cream Apply to affected area 2 times daily prn 15 g 0  . fluconazole (DIFLUCAN) 150 MG tablet 1 tab po x 1. May repeat in 72 hours if no improvement 2 tablet 0  . fluocinonide-emollient (LIDEX-E) 0.05 % cream Apply 1 application topically 2 (two) times daily. To affected rash areas 30 g 3  . predniSONE (DELTASONE) 20 MG tablet Two daily with food 10 tablet 0  . [DISCONTINUED] dicyclomine (BENTYL) 20 MG tablet Take 1 tablet (20 mg total) by mouth 3 (three) times daily before meals. 20 tablet 0  . [DISCONTINUED] fluticasone (FLONASE) 50 MCG/ACT nasal spray Place 2 sprays into both nostrils daily. 16 g 6  . [DISCONTINUED] omeprazole (PRILOSEC) 20 MG capsule Take 1 capsule (20 mg total) by mouth daily. 30 capsule 0   No current facility-administered medications on file prior to visit.     No Known Allergies  No family history on file.  Social History   Social History  . Marital status: Single    Spouse name: N/A  . Number of children: N/A  . Years of education: N/A  Occupational History  . Not on file.   Social History Main Topics  . Smoking status: Never Smoker  . Smokeless tobacco: Never Used  . Alcohol use No  . Drug use: No  . Sexual activity: Not on file   Other Topics Concern  . Not on file   Social History Narrative  . No narrative on file    Review of Systems  Constitutional: Negative.   HENT: Negative.   Eyes: Negative.   Respiratory: Negative.   Cardiovascular: Negative.   Gastrointestinal: Negative.   Genitourinary: Negative.   Musculoskeletal: Negative.   Skin: Positive for rash.  Neurological: Negative.   Endo/Heme/Allergies: Negative.   Psychiatric/Behavioral: Negative.   All other systems reviewed and are  negative.   There were no vitals taken for this visit.  Physical Exam  Constitutional: He is oriented to person, place, and time and well-developed, well-nourished, and in no distress. No distress.  HENT:  Head: Normocephalic and atraumatic.  Right Ear: External ear normal.  Left Ear: External ear normal.  Nose: Nose normal.  Mouth/Throat: Oropharynx is clear and moist. No oropharyngeal exudate.  Eyes: Conjunctivae and EOM are normal. Pupils are equal, round, and reactive to light. Right eye exhibits no discharge. Left eye exhibits no discharge. No scleral icterus.  Neck: Normal range of motion. Neck supple. No JVD present. No tracheal deviation present. No thyromegaly present.  Cardiovascular: Normal rate, regular rhythm, normal heart sounds and intact distal pulses.  Exam reveals no gallop and no friction rub.   No murmur heard. Pulmonary/Chest: Effort normal and breath sounds normal. No stridor. No respiratory distress. He has no wheezes. He has no rales. He exhibits no tenderness.  Abdominal: Soft. Bowel sounds are normal. He exhibits no distension and no mass. There is no tenderness. There is no rebound and no guarding.  Musculoskeletal: Normal range of motion. He exhibits no edema, tenderness or deformity.  Lymphadenopathy:    He has no cervical adenopathy.  Neurological: He is alert and oriented to person, place, and time. He displays normal reflexes. No cranial nerve deficit. He exhibits normal muscle tone. Coordination normal. GCS score is 15.  Skin: Skin is warm and dry. Rash (What appears to be an eczema rash on left shin as well as right forearm and left forearm) noted. He is not diaphoretic. No erythema. No pallor.  Psychiatric: Mood, memory, affect and judgment normal.  Nursing note and vitals reviewed.   Assessment/Plan: 1. Encounter to establish care - Follow-up for CPE - Continue to exercise and work on diet  2. Essential hypertension - Basic metabolic panel -  lisinopril (PRINIVIL,ZESTRIL) 20 MG tablet; Take 1 tablet (20 mg total) by mouth daily.  Dispense: 30 tablet; Refill: 3 - I would like him to start monitoring his blood pressure at home, bring log to next appointment 2 weeks  3. HIV test positive (HCC) - HIV antibody - Consider referral to infectious disease 4. Eczema, unspecified type  - triamcinolone cream (KENALOG) 0.1 %; Apply 1 application topically 2 (two) times daily.  Dispense: 30 g; Refill: 0   [name], NP

## 2016-08-03 NOTE — Patient Instructions (Signed)
It was great meeting you today!  I have sent in a prescription for lisinopril. Take this daily. Please start monitoring your blood pressures at home and bring log to next appointment   Follow up with me in 2 weeks for a physical and BP check

## 2016-08-04 LAB — HIV ANTIBODY (ROUTINE TESTING W REFLEX): HIV 1&2 Ab, 4th Generation: REACTIVE — AB

## 2016-08-04 LAB — HIV 1/2 CONFIRMATION
HIV-1 antibody: POSITIVE — AB
HIV-2 Ab: NEGATIVE

## 2016-08-05 ENCOUNTER — Other Ambulatory Visit: Payer: Self-pay | Admitting: Adult Health

## 2016-08-05 DIAGNOSIS — Z21 Asymptomatic human immunodeficiency virus [HIV] infection status: Secondary | ICD-10-CM

## 2016-08-08 ENCOUNTER — Telehealth: Payer: Self-pay | Admitting: Adult Health

## 2016-08-08 NOTE — Telephone Encounter (Signed)
Left VM to call back regarding labs

## 2016-08-17 ENCOUNTER — Ambulatory Visit: Payer: BLUE CROSS/BLUE SHIELD | Admitting: Adult Health

## 2016-08-24 ENCOUNTER — Ambulatory Visit (INDEPENDENT_AMBULATORY_CARE_PROVIDER_SITE_OTHER): Payer: BLUE CROSS/BLUE SHIELD | Admitting: Adult Health

## 2016-08-24 ENCOUNTER — Encounter: Payer: Self-pay | Admitting: Adult Health

## 2016-08-24 VITALS — BP 162/110 | Temp 98.3°F | Ht 76.0 in | Wt 248.6 lb

## 2016-08-24 DIAGNOSIS — I10 Essential (primary) hypertension: Secondary | ICD-10-CM

## 2016-08-24 MED ORDER — AMLODIPINE BESYLATE 5 MG PO TABS: 5.0000 mg | ORAL_TABLET | Freq: Every day | ORAL | 3 refills | Status: DC

## 2016-08-24 MED ORDER — LOSARTAN POTASSIUM 100 MG PO TABS: 100.0000 mg | ORAL_TABLET | Freq: Every day | ORAL | 3 refills | Status: DC

## 2016-08-24 NOTE — Progress Notes (Signed)
Subjective:    Patient ID: Lee [name], male    DOB: 12/15/1980, 36 y.o.   MRN: [id number]  HPI  36 year old male who  has a past medical history of Eczema; Environmental allergies; Essential hypertension; HIV test positive ([place]); and Shingles. He presents to the office today for two week follow up regarding hypertension. 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 In the office his blood pressure was 182/98. He was started on 20 mg of Lisinopril.  BP Readings from Last 3 Encounters:  08/03/16 (!) 182/98  04/04/15 154/94  02/13/15 171/94   Today in the office he reports good compliance with his medication. He has not been monitoring his blood pressure at home. He does report a dry cough with lisinopril.   His blood pressure is 162/110.   He reports that he has been exercising more frequently and has been eating better.   He has not heard from Infectious Disease    Review of Systems  Constitutional: Negative.   Respiratory: Positive for cough.   Cardiovascular: Negative.   Gastrointestinal: Negative.   Neurological: Negative.   All other systems reviewed and are negative.  Past Medical History:  Diagnosis Date  . Eczema   . Environmental allergies   . Essential hypertension   . HIV test positive ([place])   . Shingles     Social History   Social History  . Marital status: Single    Spouse name: N/A  . Number of children: N/A  . Years of education: N/A   Occupational History  . Not on file.   Social History Main Topics  . Smoking status: Never Smoker  . Smokeless tobacco: Never Used  . Alcohol use No  . Drug use: No  . Sexual activity: Not Currently   Other Topics Concern  . 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 WISDOM TOOTH EXTRACTION    . WRIST SURGERY      Family History  Problem Relation Age of  Onset  . Hypercalcemia Mother   . Hypertension Mother   . Hypertension Father     Allergies  Allergen Reactions  . Lisinopril     Dry cough      Current Outpatient Prescriptions on File Prior to Visit  Medication Sig Dispense Refill  . cetirizine (ZYRTEC ALLERGY) 10 MG tablet Take 1 tablet (10 mg total) by mouth daily. 90 tablet 3  . triamcinolone cream (KENALOG) 0.1 % Apply 1 application topically 2 (two) times daily. 30 g 0  . [DISCONTINUED] dicyclomine (BENTYL) 20 MG tablet Take 1 tablet (20 mg total) by mouth 3 (three) times daily before meals. 20 tablet 0  . [DISCONTINUED] fluticasone (FLONASE) 50 MCG/ACT nasal spray Place 2 sprays into both nostrils daily. 16 g 6  . [DISCONTINUED] omeprazole (PRILOSEC) 20 MG capsule Take 1 capsule (20 mg total) by mouth daily. 30 capsule 0   No current facility-administered medications on file prior to visit.     BP (!) 162/110   Temp 98.3 F (36.8 C) (Oral)   Ht 6\' 4"  (1.93 m)   Wt 248 lb 9.6 oz (112.8 kg)   BMI 30.26 kg/m        Objective:   Physical Exam  Constitutional: He is oriented to person, place, and time.  He appears well-developed and well-nourished. No distress.  Cardiovascular: Normal rate, regular rhythm, normal heart sounds and intact distal pulses.  Exam reveals no gallop and no friction rub.   No murmur heard. Pulmonary/Chest: Effort normal and breath sounds normal. No respiratory distress. He has no wheezes. He has no rales. He exhibits no tenderness.  Neurological: He is alert and oriented to person, place, and time.  Skin: Skin is warm and dry. No rash noted. He is not diaphoretic. No erythema. No pallor.  Psychiatric: He has a normal mood and affect. His behavior is normal. Judgment and thought content normal.  Nursing note and vitals reviewed.      Assessment & Plan:  1. Essential hypertension - d/c lisinopril  - losartan (COZAAR) 100 MG tablet; Take 1 tablet (100 mg total) by mouth daily.  Dispense: 30  tablet; Refill: 3 - amLODipine (NORVASC) 5 MG tablet; Take 1 tablet (5 mg total) by mouth daily.  Dispense: 90 tablet; Refill: 3 - I would like him to monitor BP at home. Bring log to next appointment  - Follow up in one month or sooner if needed  [name], NP

## 2016-09-07 ENCOUNTER — Encounter: Payer: Self-pay | Admitting: Infectious Diseases

## 2016-09-07 ENCOUNTER — Other Ambulatory Visit (HOSPITAL_COMMUNITY)
Admission: RE | Admit: 2016-09-07 | Discharge: 2016-09-07 | Disposition: A | Discharge status: Home / Self Care | Payer: BLUE CROSS/BLUE SHIELD | Source: Ambulatory Visit | Attending: Infectious Diseases | Admitting: Infectious Diseases

## 2016-09-07 ENCOUNTER — Ambulatory Visit (INDEPENDENT_AMBULATORY_CARE_PROVIDER_SITE_OTHER): Payer: BLUE CROSS/BLUE SHIELD | Admitting: Infectious Diseases

## 2016-09-07 VITALS — BP 146/94 | HR 104 | Temp 97.4°F | Wt 244.0 lb

## 2016-09-07 DIAGNOSIS — R21 Rash and other nonspecific skin eruption: Secondary | ICD-10-CM | POA: Diagnosis not present

## 2016-09-07 DIAGNOSIS — Z79899 Other long term (current) drug therapy: Secondary | ICD-10-CM

## 2016-09-07 DIAGNOSIS — I10 Essential (primary) hypertension: Secondary | ICD-10-CM | POA: Diagnosis not present

## 2016-09-07 DIAGNOSIS — Z113 Encounter for screening for infections with a predominantly sexual mode of transmission: Secondary | ICD-10-CM

## 2016-09-07 DIAGNOSIS — B20 Human immunodeficiency virus [HIV] disease: Secondary | ICD-10-CM | POA: Diagnosis not present

## 2016-09-07 LAB — COMPREHENSIVE METABOLIC PANEL
ALT: 11 U/L (ref 9–46)
AST: 22 U/L (ref 10–40)
Albumin: 2.9 g/dL — ABNORMAL LOW (ref 3.6–5.1)
Alkaline Phosphatase: 150 U/L — ABNORMAL HIGH (ref 40–115)
BUN: 14 mg/dL (ref 7–25)
CO2: 22 mmol/L (ref 20–31)
Calcium: 8.5 mg/dL — ABNORMAL LOW (ref 8.6–10.3)
Chloride: 103 mmol/L (ref 98–110)
Creat: 1.55 mg/dL — ABNORMAL HIGH (ref 0.60–1.35)
Glucose, Bld: 78 mg/dL (ref 65–99)
Potassium: 4.2 mmol/L (ref 3.5–5.3)
Sodium: 132 mmol/L — ABNORMAL LOW (ref 135–146)
Total Bilirubin: 0.4 mg/dL (ref 0.2–1.2)
Total Protein: 12.8 g/dL — ABNORMAL HIGH (ref 6.1–8.1)

## 2016-09-07 LAB — CBC
HCT: 33.4 % — ABNORMAL LOW (ref 38.5–50.0)
Hemoglobin: 11 g/dL — ABNORMAL LOW (ref 13.2–17.1)
MCH: 28.9 pg (ref 27.0–33.0)
MCHC: 32.9 g/dL (ref 32.0–36.0)
MCV: 87.9 fL (ref 80.0–100.0)
MPV: 9.9 fL (ref 7.5–12.5)
Platelets: 245 10*3/uL (ref 140–400)
RBC: 3.8 MIL/uL — ABNORMAL LOW (ref 4.20–5.80)
RDW: 15.9 % — ABNORMAL HIGH (ref 11.0–15.0)
WBC: 4.7 10*3/uL (ref 3.8–10.8)

## 2016-09-07 LAB — LIPID PANEL
Cholesterol: 79 mg/dL (ref <200)
HDL: 5 mg/dL — ABNORMAL LOW (ref >40)
LDL Cholesterol: 43 mg/dL (ref <100)
Total CHOL/HDL Ratio: 15.8 Ratio — ABNORMAL HIGH (ref <5.0)
Triglycerides: 155 mg/dL — ABNORMAL HIGH (ref <150)
VLDL: 31 mg/dL — ABNORMAL HIGH (ref <30)

## 2016-09-07 NOTE — Assessment & Plan Note (Signed)
He has moderate control Will f/u as he continues to be seen in clinic.

## 2016-09-07 NOTE — Progress Notes (Signed)
   Subjective:    Patient ID: Erick Alley, male    DOB: 1980/09/16, 36 y.o.   MRN: [id number]  HPI 36 yo M who was dx with HIV+ 08-03-16.  His risk is MSM. Sometimes condom use.  He also has a hx of HTN. He had tubes put in his ears last year.  Has had a hx of rashes- was told he was allergic to nickel.   The past medical history, family history and social history were reviewed/updated in EPIC Rare ETOH- 1-2 /yr  Review of Systems  Constitutional: Negative for appetite change, chills, fever and unexpected weight change.  HENT: Negative for mouth sores.   Respiratory: Negative for shortness of breath.   Cardiovascular: Negative for chest pain.  Gastrointestinal: Negative for constipation and diarrhea.  Genitourinary: Negative for difficulty urinating and genital sores.  Skin: Positive for rash.  Neurological: Negative for headaches.  has LE rash, LUE. Not sure how long. Pruritic.      Objective:   Physical Exam  Constitutional: He appears well-developed and well-nourished.  HENT:  Mouth/Throat: No oropharyngeal exudate.  Eyes: EOM are normal. Pupils are equal, round, and reactive to light.  Neck: Neck supple.  Cardiovascular: Normal rate, regular rhythm and normal heart sounds.   Pulmonary/Chest: Effort normal and breath sounds normal.  Abdominal: Soft. Bowel sounds are normal. There is no tenderness. There is no rebound.  Musculoskeletal: He exhibits no edema.  Lymphadenopathy:    He has no cervical adenopathy.  Skin:         Assessment & Plan:
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
## 2016-09-07 NOTE — Assessment & Plan Note (Signed)
He appears to be doing well.  Will check his baseline screening labs Offered/refused condoms. States he is not sexually active.  Explained pathology of HIV to him Explained services offered at clinic.  He refuses vaccines.  Will see him back in 2 weeks.

## 2016-09-08 LAB — HEPATITIS B SURFACE ANTIBODY,QUALITATIVE: Hep B S Ab: POSITIVE — AB

## 2016-09-08 LAB — URINE CYTOLOGY ANCILLARY ONLY
Chlamydia: NEGATIVE
Neisseria Gonorrhea: NEGATIVE

## 2016-09-08 LAB — T-HELPER CELL (CD4) - (RCID CLINIC ONLY)
CD4 % Helper T Cell: 22 % — ABNORMAL LOW (ref 33–55)
CD4 T Cell Abs: 280 /uL — ABNORMAL LOW (ref 400–2700)

## 2016-09-08 LAB — HEPATITIS B SURFACE ANTIGEN: Hepatitis B Surface Ag: NEGATIVE

## 2016-09-08 LAB — RPR TITER: RPR Titer: 1:128 {titer} — AB

## 2016-09-08 LAB — HEPATITIS A ANTIBODY, TOTAL: Hep A Total Ab: NONREACTIVE

## 2016-09-08 LAB — HEPATITIS C ANTIBODY: HCV Ab: NEGATIVE

## 2016-09-08 LAB — FLUORESCENT TREPONEMAL AB(FTA)-IGG-BLD: Fluorescent Treponemal ABS: REACTIVE — AB

## 2016-09-08 LAB — RPR: RPR Ser Ql: REACTIVE — AB

## 2016-09-09 LAB — QUANTIFERON TB GOLD ASSAY (BLOOD)
Interferon Gamma Release Assay: NEGATIVE
Mitogen-Nil: 1.73 IU/mL
Quantiferon Nil Value: 0.22 IU/mL
Quantiferon Tb Ag Minus Nil Value: <0 IU/mL

## 2016-09-12 LAB — HIV-1 RNA,QN PCR W/REFLEX GENOTYPE
HIV-1 RNA, QN PCR: 106000 Copies/mL — ABNORMAL HIGH
HIV-1 RNA, QN PCR: 5.03 Log cps/mL — ABNORMAL HIGH

## 2016-09-13 ENCOUNTER — Telehealth: Payer: Self-pay | Admitting: *Deleted

## 2016-09-13 NOTE — Telephone Encounter (Signed)
Patient is scheduled 2/22 for treatment per verbal order from Dr Johnnye Sima. Landis Gandy, RN

## 2016-09-13 NOTE — Telephone Encounter (Signed)
-----   Message from Campbell Riches, MD sent at 09/08/2016  9:53 AM EST ----- Pt appears to have syphillis  Needs benzathine Penicillin G 2.4 million units IM times one  Thanks jeff ----- Message ----- From: Interface, Lab In [facility] Sent: 09/07/2016   9:42 PM To: Campbell Riches, MD

## 2016-09-14 ENCOUNTER — Ambulatory Visit (INDEPENDENT_AMBULATORY_CARE_PROVIDER_SITE_OTHER): Payer: BLUE CROSS/BLUE SHIELD | Admitting: *Deleted

## 2016-09-14 DIAGNOSIS — B20 Human immunodeficiency virus [HIV] disease: Secondary | ICD-10-CM

## 2016-09-14 DIAGNOSIS — A539 Syphilis, unspecified: Secondary | ICD-10-CM | POA: Diagnosis not present

## 2016-09-14 MED ORDER — PENICILLIN G BENZATHINE 1200000 UNIT/2ML IM SUSP
1.2000 10*6.[IU] | Freq: Once | INTRAMUSCULAR | Status: AC
  Administered 2016-09-14: 1.2 10*6.[IU] via INTRAMUSCULAR

## 2016-09-15 LAB — HLA B*5701: HLA-B*5701 w/rflx HLA-B High: NEGATIVE

## 2016-09-18 LAB — HIV-1 GENOTYPR PLUS

## 2016-09-20 ENCOUNTER — Encounter: Payer: Self-pay | Admitting: Infectious Diseases

## 2016-09-20 ENCOUNTER — Other Ambulatory Visit: Payer: Self-pay | Admitting: Pharmacist Clinician (PhC)/ Clinical Pharmacy Specialist

## 2016-09-20 ENCOUNTER — Ambulatory Visit (INDEPENDENT_AMBULATORY_CARE_PROVIDER_SITE_OTHER): Payer: BLUE CROSS/BLUE SHIELD | Admitting: Infectious Diseases

## 2016-09-20 VITALS — BP 145/87 | HR 102 | Temp 98.7°F | Ht 76.0 in | Wt 235.0 lb

## 2016-09-20 DIAGNOSIS — Z23 Encounter for immunization: Secondary | ICD-10-CM | POA: Diagnosis not present

## 2016-09-20 DIAGNOSIS — I10 Essential (primary) hypertension: Secondary | ICD-10-CM

## 2016-09-20 DIAGNOSIS — B20 Human immunodeficiency virus [HIV] disease: Secondary | ICD-10-CM

## 2016-09-20 DIAGNOSIS — Z789 Other specified health status: Secondary | ICD-10-CM | POA: Insufficient documentation

## 2016-09-20 DIAGNOSIS — Z21 Asymptomatic human immunodeficiency virus [HIV] infection status: Secondary | ICD-10-CM

## 2016-09-20 MED ORDER — ABACAVIR-DOLUTEGRAVIR-LAMIVUD 600-50-300 MG PO TABS: 1.0000 | ORAL_TABLET | Freq: Every day | ORAL | 3 refills | Status: DC

## 2016-09-20 NOTE — Addendum Note (Signed)
Addended by: Roma Kayser on: 09/20/2016 05:16 PM   Modules accepted: Orders

## 2016-09-20 NOTE — Progress Notes (Signed)
HPI: Lee [name] is a 36 y.o. male who is here for his HIV visit.   Allergies: Allergies  Allergen Reactions  . Lisinopril     Dry cough      Vitals: Temp: 98.7 F (37.1 C) (02/28 1503) Temp Source: Oral (02/28 1503) BP: 145/87 (02/28 1503) Pulse Rate: 102 (02/28 1503)  Past Medical History: Past Medical History:  Diagnosis Date  . Eczema   . Environmental allergies   . Essential hypertension   . HIV test positive ([place])   . 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 Shingles     Social History: Social History   Social History  . Marital status: Single    Spouse name: N/A  . Number of children: N/A  . Years of education: N/A   Social History Main Topics  . Smoking status: Never Smoker  . Smokeless tobacco: Never Used  . Alcohol use No  . Drug use: No  . Sexual activity: Not Currently   Other Topics Concern  . None   Social History Narrative   Works as Nurse, 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       Previous Regimen: None  Current Regimen: None  Labs: HIV 1 RNA Quant (copies/mL)  Date Value  10/17/2007 38200 (H)   CD4 T Cell Abs (/uL)  Date Value  09/07/2016 280 (L)   Hep B S Ab (no units)  Date Value  09/07/2016 POS (A)   Hepatitis B Surface Ag (no units)  Date Value  09/07/2016 NEGATIVE   HCV Ab (no units)  Date Value  09/07/2016 NEGATIVE    CrCl: Estimated Creatinine Clearance: 90.8 mL/min (by C-G formula based on SCr of 1.55 mg/dL (H)).  Lipids:    Component Value Date/Time   CHOL 79 09/07/2016 1551   TRIG 155 (H) 09/07/2016 1551   HDL 5 (L) 09/07/2016 1551   CHOLHDL 15.8 (H) 09/07/2016 1551   VLDL 31 (H) 09/07/2016 1551   LDLCALC 43 09/07/2016 1551    Assessment: Lee [name] was recently dx with HIV. His genotype is back and he is HLA neg. Discussed of using Triumeq in him and how importance it's to be very compliance to avoid resistance. He said that he will stay on top of it. Counseled on side effects of Triumeq and  interactions to him. He currently has Lee [name]. We will use Lee [name]'s pharmacy so that they can help him with copay if he has any. Rx will see him in 6 wks for labs and adherence.   Recommendations:  Start Triumeq 1 PO qday  F/u in 6 wks for labs with pharmacy visit  Lee [name], PharmD, BCPS, AAHIVP, CPP Clinical Infectious [facility] 09/20/2016, 4:31 PM

## 2016-09-20 NOTE — Assessment & Plan Note (Addendum)
Hep B imune Needs Hep A Refuses flu shot Needs pneumonia vax Needs mening.  Will try him on triumeq.  Offered/refused condoms.  He is exercising.  rtc in 3 months rtc with pharmacy 6 weeks.

## 2016-09-20 NOTE — Assessment & Plan Note (Signed)
He continues on his anti-htn rx.  His ACE-I maybe increasing his Cr.

## 2016-09-20 NOTE — Progress Notes (Unsigned)
HPI: Lee [name] is a 36 y.o. male who is here for his HIV visit.   Allergies: Allergies  Allergen Reactions  . Lisinopril     Dry cough      Vitals: Temp: 98.7 F (37.1 C) (02/28 1503) Temp Source: Oral (02/28 1503) BP: 145/87 (02/28 1503) Pulse Rate: 102 (02/28 1503)  Past Medical History: Past Medical History:  Diagnosis Date  . Eczema   . Environmental allergies   . Essential hypertension   . HIV test positive ([place])   . 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 Shingles     Social History: Social History   Social History  . Marital status: Single    Spouse name: N/A  . Number of children: N/A  . Years of education: N/A   Social History Main Topics  . Smoking status: Never Smoker  . Smokeless tobacco: Never Used  . Alcohol use No  . Drug use: No  . Sexual activity: Not Currently   Other Topics Concern  . 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  Lipids:    Component Value Date/Time   CHOL 79 09/07/2016 1551   TRIG 155 (H) 09/07/2016 1551   HDL 5 (L) 09/07/2016 1551   CHOLHDL 15.8 (H) 09/07/2016 1551   VLDL 31 (H) 09/07/2016 1551   LDLCALC 43 09/07/2016 1551    Assessment: [name] is here for his recent dx of HIV. His genotype is back and HLA neg. He is ready to start Triumeq. He is currently insured with [employer]. Therefore, we'll use [name] pharmacy so they can handle his copay. He will come back and see me in 6 wks so  we can do labs. Explained all of the potential side effects and interactions to him.   Recommendations:  Start Triumeq 1 PO qday F/u with pharmacy in 6 wks for labs  [name], PharmD, BCPS, AAHIVP, CPP Clinical Infectious [facility] 09/20/2016, 4:03 PM

## 2016-09-20 NOTE — Progress Notes (Signed)
   Subjective:    Patient ID: Lee [name], male    DOB: 1980-12-08, 36 y.o.   MRN: [id number]  HPI 36 yo M with hx of HTN and HIV+ dx 08-03-16.  He had first visit 2-15 and was also found to have syphilis. He received IM PEN G on 2-22.  His genotype is naive. He is HLA negative.  He complains of seasonal allergies today. Sinus congestion, chest cough. Has taken multiple OTCs with minimal relief. Took shots when he was a child, went every 2 weeks.   HIV 1 RNA Quant (copies/mL)  Date Value  10/17/2007 38200 (H)   CD4 T Cell Abs (/uL)  Date Value  09/07/2016 280 (L)    Review of Systems  Constitutional: Negative for appetite change, chills, fever and unexpected weight change.  HENT: Positive for postnasal drip.   Respiratory: Positive for cough.   Gastrointestinal: Negative for constipation and diarrhea.  Genitourinary: Negative for difficulty urinating.       Objective:   Physical Exam  Constitutional: He appears well-developed and well-nourished.  HENT:  Mouth/Throat: No oropharyngeal exudate.  Eyes: EOM are normal. Pupils are equal, round, and reactive to light.  Neck: Neck supple.  Cardiovascular: Normal rate, regular rhythm and normal heart sounds.   Pulmonary/Chest: Effort normal and breath sounds normal.  Abdominal: Soft. Bowel sounds are normal. There is no tenderness. There is no rebound.  Lymphadenopathy:    He has no cervical adenopathy.       Assessment & Plan:

## 2016-09-21 ENCOUNTER — Encounter: Payer: Self-pay | Admitting: Adult Health

## 2016-09-21 ENCOUNTER — Ambulatory Visit (INDEPENDENT_AMBULATORY_CARE_PROVIDER_SITE_OTHER): Payer: BLUE CROSS/BLUE SHIELD | Admitting: Adult Health

## 2016-09-21 DIAGNOSIS — I10 Essential (primary) hypertension: Secondary | ICD-10-CM | POA: Diagnosis not present

## 2016-09-21 MED ORDER — AMLODIPINE BESYLATE 10 MG PO TABS: 10.0000 mg | ORAL_TABLET | Freq: Every day | ORAL | 3 refills | Status: DC

## 2016-09-21 MED ORDER — LOSARTAN POTASSIUM 100 MG PO TABS: 100.0000 mg | ORAL_TABLET | Freq: Every day | ORAL | 3 refills | Status: DC

## 2016-09-21 NOTE — Progress Notes (Signed)
Subjective:    Patient ID: Lee [name], male    DOB: [birth year], 36 y.o.   MRN: [id number]  HPI  36 year old male who  has a past medical history of Eczema; Environmental allergies; Essential hypertension; HIV test positive ([place]); 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[place]) (07/24/2016); and Shingles. He presents to the clinic today for one month follow up regarding hypertension. During the last visit Norvasc 5 mg was added to Losartan 100mg  for uncontrolled hypertension   He took his medication right before coming to the office. His blood pressure has improved - per readings at ID  BP Readings from Last 3 Encounters:  09/21/16 (!) 160/90  09/20/16 (!) 145/87  09/07/16 (!) 146/94    He reports overall he feels better than prior to coming to see [place].    Review of Systems See HPI  Past Medical History:  Diagnosis Date  . Eczema   . Environmental allergies   . Essential hypertension   . HIV test positive ([place])   . 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 Shingles     Social History   Social History  . Marital status: Single    Spouse name: N/A  . Number of children: N/A  . Years of education: N/A   Occupational History  . Not on file.   Social History Main Topics  . Smoking status: Never Smoker  . Smokeless tobacco: Never Used  . Alcohol use No  . Drug use: No  . Sexual activity: Not Currently   Other Topics Concern  . 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 TYMPANOSTOMY TUBE PLACEMENT    . WISDOM TOOTH EXTRACTION    . WRIST SURGERY      Family History  Problem Relation Age of Onset  . Hypercalcemia Mother   . Hypertension Mother   . Hypertension Father     Allergies  Allergen Reactions  . Lisinopril     Dry cough      Current Outpatient Prescriptions on File Prior to Visit  Medication Sig Dispense Refill  .  abacavir-dolutegravir-lamiVUDine (TRIUMEQ) 600-50-300 MG tablet Take 1 tablet by mouth daily. 90 tablet 3  . cetirizine (ZYRTEC ALLERGY) 10 MG tablet Take 1 tablet (10 mg total) by mouth daily. 90 tablet 3  . triamcinolone cream (KENALOG) 0.1 % Apply 1 application topically 2 (two) times daily. 30 g 0  . [DISCONTINUED] dicyclomine (BENTYL) 20 MG tablet Take 1 tablet (20 mg total) by mouth 3 (three) times daily before meals. 20 tablet 0  . [DISCONTINUED] fluticasone (FLONASE) 50 MCG/ACT nasal spray Place 2 sprays into both nostrils daily. 16 g 6  . [DISCONTINUED] omeprazole (PRILOSEC) 20 MG capsule Take 1 capsule (20 mg total) by mouth daily. 30 capsule 0   No current facility-administered medications on file prior to visit.     BP (!) 160/90   Temp 98.1 F (36.7 C) (Oral)   Wt 246 lb 8 oz (111.8 kg)   BMI 30.00 kg/m       Objective:   Physical Exam  Constitutional: He is oriented to person, place, and time. He appears well-developed and well-nourished. No distress.  Cardiovascular: Normal rate, regular rhythm, normal heart sounds and intact distal pulses.  Exam reveals no gallop and no friction rub.   No murmur heard. Pulmonary/Chest: Breath sounds normal.  No respiratory distress. He has no wheezes. He has no rales.  Neurological: He is alert and oriented to person, place, and time.  Skin: Skin is warm and dry. He is not diaphoretic.  Psychiatric: He has a normal mood and affect. His behavior is normal. Thought content normal.  Nursing note and vitals reviewed.      Assessment & Plan:  1. Essential hypertension - Blood pressure in the 99991111 systolic at ID. Will increase Norvasc to 10 mg from 5 mg  - amLODipine (NORVASC) 10 MG tablet; Take 1 tablet (10 mg total) by mouth daily.  Dispense: 90 tablet; Refill: 3 - losartan (COZAAR) 100 MG tablet; Take 1 tablet (100 mg total) by mouth daily.  Dispense: 90 tablet; Refill: 3 - Follow up for CPE or sooner if needed  [name],  NP

## 2016-09-22 ENCOUNTER — Other Ambulatory Visit: Payer: Self-pay | Admitting: Pharmacist

## 2016-09-22 ENCOUNTER — Encounter: Payer: Self-pay | Admitting: Pharmacist

## 2016-09-22 DIAGNOSIS — B20 Human immunodeficiency virus [HIV] disease: Secondary | ICD-10-CM

## 2016-09-22 DIAGNOSIS — I10 Essential (primary) hypertension: Secondary | ICD-10-CM

## 2016-09-22 MED ORDER — AMLODIPINE BESYLATE 10 MG PO TABS: 10.0000 mg | ORAL_TABLET | Freq: Every day | ORAL | 3 refills | Status: DC

## 2016-09-22 MED ORDER — ABACAVIR-DOLUTEGRAVIR-LAMIVUD 600-50-300 MG PO TABS: 1.0000 | ORAL_TABLET | Freq: Every day | ORAL | 3 refills | Status: DC

## 2016-09-22 MED ORDER — LOSARTAN POTASSIUM 100 MG PO TABS: 100.0000 mg | ORAL_TABLET | Freq: Every day | ORAL | 3 refills | Status: DC

## 2016-09-22 NOTE — Progress Notes (Signed)
Received notification from [facility] in [place] that patient's insurance requires he use [facility], so I resent his Triumeq, amlodipine, and losartan there.

## 2016-10-03 ENCOUNTER — Telehealth: Payer: Self-pay | Admitting: *Deleted

## 2016-10-03 NOTE — Telephone Encounter (Signed)
Rep from the state called to ask if the patient is going to be treated for his recent +RPR. Advised he was treated 09/14/16 per Dr Johnnye Sima. She advised that per CDC guidelines the patient should be treated x 3 not just once. Advised her can ask the doctor about it and get back to her.

## 2016-10-03 NOTE — Telephone Encounter (Signed)
Patient has been advised to go to the [facility] for treatment by state case manager.

## 2016-10-03 NOTE — Telephone Encounter (Signed)
Please give him next 2 injections thanks

## 2016-11-01 ENCOUNTER — Ambulatory Visit: Payer: BLUE CROSS/BLUE SHIELD

## 2016-12-12 ENCOUNTER — Telehealth: Payer: Self-pay

## 2016-12-12 NOTE — Telephone Encounter (Signed)
Fax received from [facility] requesting several prescriptions for this pt including lidocaine cream. Fax looks suspicious for fraud. Spoke with pt and he advises he has NOT requested any of these medications and does not want them. Request faxed back to pharmacy for denial.  Nothing further needed at this time.

## 2016-12-21 ENCOUNTER — Ambulatory Visit (INDEPENDENT_AMBULATORY_CARE_PROVIDER_SITE_OTHER): Payer: BLUE CROSS/BLUE SHIELD | Admitting: Infectious Diseases

## 2016-12-21 ENCOUNTER — Encounter: Payer: Self-pay | Admitting: Infectious Diseases

## 2016-12-21 VITALS — BP 137/82 | HR 84 | Temp 98.2°F

## 2016-12-21 DIAGNOSIS — J301 Allergic rhinitis due to pollen: Secondary | ICD-10-CM | POA: Diagnosis not present

## 2016-12-21 DIAGNOSIS — I10 Essential (primary) hypertension: Secondary | ICD-10-CM | POA: Diagnosis not present

## 2016-12-21 DIAGNOSIS — B20 Human immunodeficiency virus [HIV] disease: Secondary | ICD-10-CM | POA: Diagnosis not present

## 2016-12-21 LAB — COMPREHENSIVE METABOLIC PANEL
ALT: 139 U/L — ABNORMAL HIGH (ref 9–46)
AST: 107 U/L — ABNORMAL HIGH (ref 10–40)
Albumin: 3.1 g/dL — ABNORMAL LOW (ref 3.6–5.1)
Alkaline Phosphatase: 155 U/L — ABNORMAL HIGH (ref 40–115)
BUN: 17 mg/dL (ref 7–25)
CO2: 19 mmol/L — ABNORMAL LOW (ref 20–31)
Calcium: 8.8 mg/dL (ref 8.6–10.3)
Chloride: 106 mmol/L (ref 98–110)
Creat: 1.51 mg/dL — ABNORMAL HIGH (ref 0.60–1.35)
Glucose, Bld: 80 mg/dL (ref 65–99)
Potassium: 4 mmol/L (ref 3.5–5.3)
Sodium: 134 mmol/L — ABNORMAL LOW (ref 135–146)
Total Bilirubin: 0.6 mg/dL (ref 0.2–1.2)
Total Protein: 9.7 g/dL — ABNORMAL HIGH (ref 6.1–8.1)

## 2016-12-21 NOTE — Assessment & Plan Note (Signed)
Appears to be doing well Will check his CD4 and HIV RNA today See him back in 6 mnths Offered/refused condoms.

## 2016-12-21 NOTE — Progress Notes (Signed)
   Subjective:    Patient ID: Lee [name], male    DOB: [birth year], 36 y.o.   MRN: [id number]  HPI 36 yo M with hx of HTN and HIV+ dx 08-03-16.  He had first visit 2-15 and was also found to have syphilis. He received IM PEN G on 2-22.  His genotype is naive. He is HLA negative.  Has been doing well with triumeq.  No missed doses, no side effects.   HIV 1 RNA Quant (copies/mL)  Date Value  10/17/2007 38200 (H)   CD4 T Cell Abs (/uL)  Date Value  09/07/2016 280 (L)   Love life- non-existent because of work.   Review of Systems  Constitutional: Negative for appetite change and unexpected weight change.  HENT: Negative for postnasal drip and rhinorrhea.   Respiratory: Negative for cough and shortness of breath.   Cardiovascular: Negative for chest pain.  Gastrointestinal: Negative for constipation and diarrhea.  Genitourinary: Negative for difficulty urinating.  Neurological: Negative for headaches.  has BM directly after eating.      Objective:   Physical Exam  Constitutional: He appears well-developed and well-nourished.  HENT:  Mouth/Throat: No oropharyngeal exudate.  Eyes: EOM are normal. Pupils are equal, round, and reactive to light.  Neck: Neck supple.  Cardiovascular: Normal rate, regular rhythm and normal heart sounds.   Pulmonary/Chest: Effort normal and breath sounds normal.  Abdominal: Soft. Bowel sounds are normal. There is no tenderness. There is no rebound.  Musculoskeletal: He exhibits no edema.  Lymphadenopathy:    He has no cervical adenopathy.  Psychiatric: He has a normal mood and affect.      Assessment & Plan:

## 2016-12-21 NOTE — Assessment & Plan Note (Signed)
Well controlled, no post-nasal drip or rhinorrhea.  occas cough when around cut grass

## 2016-12-21 NOTE — Assessment & Plan Note (Signed)
Doing well Will check his Cr and K+

## 2016-12-22 LAB — T-HELPER CELL (CD4) - (RCID CLINIC ONLY)
CD4 % Helper T Cell: 23 % — ABNORMAL LOW (ref 33–55)
CD4 T Cell Abs: 430 /uL (ref 400–2700)

## 2016-12-23 LAB — HIV-1 RNA QUANT-NO REFLEX-BLD
HIV 1 RNA Quant: 26 copies/mL — ABNORMAL HIGH
HIV-1 RNA Quant, Log: 1.41 Log copies/mL — ABNORMAL HIGH

## 2016-12-25 ENCOUNTER — Encounter: Payer: Self-pay | Admitting: Licensed Clinical Social Worker

## 2016-12-29 ENCOUNTER — Ambulatory Visit (INDEPENDENT_AMBULATORY_CARE_PROVIDER_SITE_OTHER): Payer: BLUE CROSS/BLUE SHIELD | Admitting: Pharmacist Clinician (PhC)/ Clinical Pharmacy Specialist

## 2016-12-29 DIAGNOSIS — B20 Human immunodeficiency virus [HIV] disease: Secondary | ICD-10-CM

## 2016-12-29 LAB — COMPLETE METABOLIC PANEL WITH GFR
ALT: 142 U/L — ABNORMAL HIGH (ref 9–46)
AST: 129 U/L — ABNORMAL HIGH (ref 10–40)
Albumin: 3.3 g/dL — ABNORMAL LOW (ref 3.6–5.1)
Alkaline Phosphatase: 162 U/L — ABNORMAL HIGH (ref 40–115)
BUN: 27 mg/dL — ABNORMAL HIGH (ref 7–25)
CO2: 23 mmol/L (ref 20–31)
Calcium: 8.9 mg/dL (ref 8.6–10.3)
Chloride: 102 mmol/L (ref 98–110)
Creat: 2.17 mg/dL — ABNORMAL HIGH (ref 0.60–1.35)
GFR, Est African American: 45 mL/min — ABNORMAL LOW (ref >=60)
GFR, Est Non African American: 39 mL/min — ABNORMAL LOW (ref >=60)
Glucose, Bld: 86 mg/dL (ref 65–99)
Potassium: 4 mmol/L (ref 3.5–5.3)
Sodium: 136 mmol/L (ref 135–146)
Total Bilirubin: 0.6 mg/dL (ref 0.2–1.2)
Total Protein: 6.5 g/dL (ref 6.1–8.1)

## 2016-12-29 NOTE — Progress Notes (Signed)
HPI: Lee [name] is a 36 y.o. male who is here to see pharmacy for a LFTs repeat.   Allergies: Allergies  Allergen Reactions  . Lisinopril     Dry cough      Vitals:    Past Medical History: Past Medical History:  Diagnosis Date  . Eczema   . Environmental allergies   . Essential hypertension   . HIV test positive ([place])   . 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[place]) 07/24/2016  . Shingles     Social History: Social History   Social History  . Marital status: Single    Spouse name: N/A  . Number of children: N/A  . Years of education: N/A   Social History Main Topics  . Smoking status: Never Smoker  . Smokeless tobacco: Never Used  . Alcohol use No  . Drug use: No  . Sexual activity: Not Currently   Other Topics Concern  . 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  Lipids:    Component Value Date/Time   CHOL 79 09/07/2016 1551   TRIG 155 (H) 09/07/2016 1551   HDL 5 (L) 09/07/2016 1551   CHOLHDL 15.8 (H) 09/07/2016 1551   VLDL 31 (H) 09/07/2016 1551   LDLCALC 43 09/07/2016 1551    Assessment: Lee [name] is doing very well on his Triumeq. He has not had any side effects or miss doses so far. He was recently seen by Dr. Johnnye Sima on 5/31 and a CMP was done. LFTs were elevated at that time. We brought him back today to repeat the CMP.   Recommendations:  Cont Triumeq CMP today  [name], PharmD, BCPS, AAHIVP, CPP Clinical Infectious [place] for Infectious Disease 12/29/2016, 9:14 AM

## 2017-01-01 ENCOUNTER — Telehealth: Payer: Self-pay | Admitting: *Deleted

## 2017-01-01 NOTE — Telephone Encounter (Signed)
Per Dr. Johnnye Sima patient notified of appointment on 01/12/17 at 10:30 AM. [place]

## 2017-01-01 NOTE — Telephone Encounter (Signed)
-----   Message from Campbell Riches, MD sent at 12/30/2016 10:31 AM EDT ----- Pt needs visit with me in next 2 weeks Can we discuss on Monday? thanks

## 2017-01-12 ENCOUNTER — Encounter: Payer: Self-pay | Admitting: Infectious Diseases

## 2017-01-12 ENCOUNTER — Ambulatory Visit (INDEPENDENT_AMBULATORY_CARE_PROVIDER_SITE_OTHER): Payer: BLUE CROSS/BLUE SHIELD | Admitting: Infectious Diseases

## 2017-01-12 VITALS — BP 135/82 | HR 93 | Temp 98.6°F | Wt 257.0 lb

## 2017-01-12 DIAGNOSIS — B20 Human immunodeficiency virus [HIV] disease: Secondary | ICD-10-CM | POA: Diagnosis not present

## 2017-01-12 DIAGNOSIS — K759 Inflammatory liver disease, unspecified: Secondary | ICD-10-CM | POA: Diagnosis not present

## 2017-01-12 LAB — COMPREHENSIVE METABOLIC PANEL WITH GFR
ALT: 231 U/L — ABNORMAL HIGH (ref 9–46)
AST: 225 U/L — ABNORMAL HIGH (ref 10–40)
Albumin: 3.1 g/dL — ABNORMAL LOW (ref 3.6–5.1)
Alkaline Phosphatase: 161 U/L — ABNORMAL HIGH (ref 40–115)
BUN: 12 mg/dL (ref 7–25)
CO2: 25 mmol/L (ref 20–31)
Calcium: 8.9 mg/dL (ref 8.6–10.3)
Chloride: 105 mmol/L (ref 98–110)
Creat: 1.55 mg/dL — ABNORMAL HIGH (ref 0.60–1.35)
Glucose, Bld: 81 mg/dL (ref 65–99)
Potassium: 4.2 mmol/L (ref 3.5–5.3)
Sodium: 136 mmol/L (ref 135–146)
Total Bilirubin: 0.7 mg/dL (ref 0.2–1.2)
Total Protein: 9.6 g/dL — ABNORMAL HIGH (ref 6.1–8.1)

## 2017-01-12 LAB — HEPATITIS C ANTIBODY: HCV Ab: NEGATIVE

## 2017-01-12 LAB — HEPATITIS B SURFACE ANTIBODY,QUALITATIVE: Hep B S Ab: POSITIVE — AB

## 2017-01-12 LAB — HEPATITIS B SURFACE ANTIGEN: Hepatitis B Surface Ag: NEGATIVE

## 2017-01-12 NOTE — Progress Notes (Signed)
   Subjective:    Patient ID: Lee [name], male    DOB: 10/02/1980, 35 y.o.   MRN: [id number]  HPI 35 yo M with hx of HTN and HIV+ dx 08-03-16.  He had first visit 2-15 and was also found to have syphilis. He received IM PEN G on 2-22.  His genotype is naive. He is HLA negative.  Has been on triumeq but at 5-31 he had increased LFTs and Cr on labs. He had repeat labs done on 6-8 which confirmed this.  No new medicines, has been "working out more". No herbals, steroids, androgens.  Has started MVI.   meds reviewed.  Started anti-htn rx around February.   HIV 1 RNA Quant (copies/mL)  Date Value  12/21/2016 26 (H)  10/17/2007 38200 (H)   CD4 T Cell Abs (/uL)  Date Value  12/21/2016 430  09/07/2016 280 (L)      Review of Systems  Constitutional: Negative for appetite change and unexpected weight change.  Gastrointestinal: Negative for abdominal pain, constipation and diarrhea.  Genitourinary: Negative for difficulty urinating.  Hematological: Does not bruise/bleed easily.   Wt up due to lifting weights    Objective:   Physical Exam  Constitutional: He appears well-developed and well-nourished.  HENT:  Mouth/Throat: No oropharyngeal exudate.  Eyes: EOM are normal. Pupils are equal, round, and reactive to light.  Neck: Normal range of motion. Neck supple.  Cardiovascular: Normal rate, regular rhythm and normal heart sounds.   Pulmonary/Chest: Effort normal and breath sounds normal.  Abdominal: Soft. Bowel sounds are normal. There is no tenderness. There is no rebound.  Musculoskeletal: He exhibits no edema.  Lymphadenopathy:    He has no cervical adenopathy.      Assessment & Plan:   

## 2017-01-12 NOTE — Assessment & Plan Note (Signed)
Will check his LFTs and Cr (suspect his Cr is from [facility]).  Will check liver/renal u/s.  Check hepatitis viral screens.  Will have him back with pharm in 2 weeks, if still abn and other tests negative, will change to dolutegravir/descovey

## 2017-01-12 NOTE — Addendum Note (Signed)
Addended by: Cary Wilford C on: 01/12/2017 11:00 AM   Modules accepted: Orders

## 2017-01-26 ENCOUNTER — Ambulatory Visit: Payer: BLUE CROSS/BLUE SHIELD

## 2017-02-21 NOTE — Addendum Note (Signed)
Addended by: Landis Gandy on: 02/21/2017 02:33 PM   Modules accepted: Orders

## 2017-02-23 ENCOUNTER — Ambulatory Visit (HOSPITAL_COMMUNITY)
Admission: RE | Admit: 2017-02-23 | Discharge: 2017-02-23 | Disposition: A | Discharge status: Home / Self Care | Payer: BLUE CROSS/BLUE SHIELD | Source: Ambulatory Visit | Attending: Infectious Diseases | Admitting: Infectious Diseases

## 2017-02-23 DIAGNOSIS — R161 Splenomegaly, not elsewhere classified: Secondary | ICD-10-CM | POA: Insufficient documentation

## 2017-02-23 DIAGNOSIS — N281 Cyst of kidney, acquired: Secondary | ICD-10-CM | POA: Diagnosis not present

## 2017-02-23 DIAGNOSIS — B20 Human immunodeficiency virus [HIV] disease: Secondary | ICD-10-CM | POA: Diagnosis not present

## 2017-02-23 DIAGNOSIS — K759 Inflammatory liver disease, unspecified: Secondary | ICD-10-CM

## 2017-03-02 ENCOUNTER — Ambulatory Visit: Payer: BLUE CROSS/BLUE SHIELD

## 2017-03-09 ENCOUNTER — Ambulatory Visit (INDEPENDENT_AMBULATORY_CARE_PROVIDER_SITE_OTHER): Payer: BLUE CROSS/BLUE SHIELD | Admitting: Pharmacist Clinician (PhC)/ Clinical Pharmacy Specialist

## 2017-03-09 DIAGNOSIS — B20 Human immunodeficiency virus [HIV] disease: Secondary | ICD-10-CM | POA: Diagnosis not present

## 2017-03-09 DIAGNOSIS — Z23 Encounter for immunization: Secondary | ICD-10-CM | POA: Diagnosis not present

## 2017-03-09 MED ORDER — BICTEGRAVIR-EMTRICITAB-TENOFOV 50-200-25 MG PO TABS: 1.0000 | ORAL_TABLET | Freq: Every day | ORAL | 6 refills | Status: DC

## 2017-03-09 NOTE — Patient Instructions (Addendum)
Labs today Come back and see me in Oct for further labs on 10/11 at 4 PM Stop your Triumeq and start [name] 1 daily

## 2017-03-09 NOTE — Progress Notes (Signed)
HPI: Lee [name] is a 36 y.o. male who is here to f/u with pharmacy for his ART change and labs  Allergies: Allergies  Allergen Reactions  . Lisinopril     Dry cough      Vitals:    Past Medical History: Past Medical History:  Diagnosis Date  . Eczema   . Environmental allergies   . Essential hypertension   . [name]   . 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 07/24/2016  . Shingles     Social History: Social History   Social History  . Marital status: Single    Spouse name: N/A  . Number of children: N/A  . Years of education: N/A   Social History Main Topics  . Smoking status: Never Smoker  . Smokeless tobacco: Never Used  . Alcohol use No  . Drug use: No  . Sexual activity: Not Currently   Other Topics Concern  . 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  Lipids:    Component Value Date/Time   CHOL 79 09/07/2016 1551   TRIG 155 (H) 09/07/2016 1551   HDL 5 (L) 09/07/2016 1551   CHOLHDL 15.8 (H) 09/07/2016 1551   VLDL 31 (H) 09/07/2016 1551   LDLCALC 43 09/07/2016 1551    Assessment: Lee [name] has been on Triumeq since Feb of this year. He has been very compliant with his medication and has not missed any doses. He hasn't had any side effects from the Triumeq. However, his LFTs have been up since his ART. Dr. Johnnye Sima order the hepatitis panel but everything came back neg.  His LFTs are continuing to go up. It very well could be associated with the abacavir in Triumeq. Therefore, we are going change him to [name] today. After consulting with Dr. Johnnye Sima, we are going to get CMV, Toxo, and EBV also along with his HIV labs.  He is due to his second hep A today and Menveo so we will give it to him today.   We will bring him back in about 6 wks to repeat his LFTs and HIV labs after starting on [name]. Activate the new copay card for him.   Recommendations:  HIV VL, CD4, EBV, CMV, Toxo titers today Stop [place] 1 daily F/u with pharmacy in 6 wks Hep A #2 and Menveo #2 today  [name], PharmD, BCPS, AAHIVP, CPP Clinical Infectious Disease [place] for Infectious Disease 03/09/2017, 9:53 AM

## 2017-03-10 LAB — COMPLETE METABOLIC PANEL WITH GFR
ALT: 255 U/L — ABNORMAL HIGH (ref 9–46)
AST: 244 U/L — ABNORMAL HIGH (ref 10–40)
Albumin: 3.2 g/dL — ABNORMAL LOW (ref 3.6–5.1)
Alkaline Phosphatase: 146 U/L — ABNORMAL HIGH (ref 40–115)
BUN: 22 mg/dL (ref 7–25)
CO2: 20 mmol/L (ref 20–32)
Calcium: 9 mg/dL (ref 8.6–10.3)
Chloride: 106 mmol/L (ref 98–110)
Creat: 1.89 mg/dL — ABNORMAL HIGH (ref 0.60–1.35)
GFR, Est African American: 52 mL/min — ABNORMAL LOW (ref >=60)
GFR, Est Non African American: 45 mL/min — ABNORMAL LOW (ref >=60)
Glucose, Bld: 83 mg/dL (ref 65–99)
Potassium: 4 mmol/L (ref 3.5–5.3)
Sodium: 136 mmol/L (ref 135–146)
Total Bilirubin: 2 mg/dL — ABNORMAL HIGH (ref 0.2–1.2)
Total Protein: 9 g/dL — ABNORMAL HIGH (ref 6.1–8.1)

## 2017-03-12 LAB — EPSTEIN-BARR VIRUS NUCLEAR ANTIGEN ANTIBODY, IGG: EBV NA IgG: 117 U/mL — ABNORMAL HIGH

## 2017-03-13 LAB — TOXOPLASMA ANTIBODIES- IGG AND  IGM
Toxoplasma Antibody- IgM: <8 AU/mL (ref <8.00)
Toxoplasma IgG Ratio: <7.2 IU/mL (ref <7.20)

## 2017-03-13 LAB — HIV-1 RNA QUANT-NO REFLEX-BLD
HIV 1 RNA Quant: 23 copies/mL — ABNORMAL HIGH
HIV-1 RNA Quant, Log: 1.36 Log copies/mL — ABNORMAL HIGH

## 2017-03-13 LAB — CYTOMEGALOVIRUS ANTIBODY, IGG: Cytomegalovirus Ab-IgG: >10 U/mL — ABNORMAL HIGH (ref <0.60)

## 2017-03-13 LAB — CMV IGM: CMV IgM: <30 AU/mL (ref <30.00)

## 2017-04-04 DIAGNOSIS — H7292 Unspecified perforation of tympanic membrane, left ear: Secondary | ICD-10-CM | POA: Diagnosis not present

## 2017-04-04 DIAGNOSIS — H906 Mixed conductive and sensorineural hearing loss, bilateral: Secondary | ICD-10-CM | POA: Diagnosis not present

## 2017-04-04 DIAGNOSIS — H9211 Otorrhea, right ear: Secondary | ICD-10-CM | POA: Diagnosis not present

## 2017-04-04 DIAGNOSIS — Z9622 Myringotomy tube(s) status: Secondary | ICD-10-CM | POA: Diagnosis not present

## 2017-04-04 DIAGNOSIS — H6983 Other specified disorders of Eustachian tube, bilateral: Secondary | ICD-10-CM | POA: Diagnosis not present

## 2017-04-13 ENCOUNTER — Encounter: Payer: Self-pay | Admitting: Adult Health

## 2017-05-03 ENCOUNTER — Ambulatory Visit: Payer: BLUE CROSS/BLUE SHIELD

## 2017-07-26 ENCOUNTER — Other Ambulatory Visit: Payer: Self-pay

## 2017-07-26 ENCOUNTER — Ambulatory Visit (INDEPENDENT_AMBULATORY_CARE_PROVIDER_SITE_OTHER): Payer: BLUE CROSS/BLUE SHIELD | Admitting: Infectious Diseases

## 2017-07-26 ENCOUNTER — Other Ambulatory Visit (HOSPITAL_COMMUNITY)
Admission: RE | Admit: 2017-07-26 | Discharge: 2017-07-26 | Disposition: A | Discharge status: Home / Self Care | Payer: BLUE CROSS/BLUE SHIELD | Source: Ambulatory Visit | Attending: Infectious Diseases | Admitting: Infectious Diseases

## 2017-07-26 ENCOUNTER — Encounter: Payer: Self-pay | Admitting: Infectious Diseases

## 2017-07-26 VITALS — BP 132/84 | HR 74 | Temp 98.4°F | Ht 76.0 in | Wt 279.0 lb

## 2017-07-26 DIAGNOSIS — I10 Essential (primary) hypertension: Secondary | ICD-10-CM | POA: Diagnosis not present

## 2017-07-26 DIAGNOSIS — B20 Human immunodeficiency virus [HIV] disease: Secondary | ICD-10-CM | POA: Diagnosis not present

## 2017-07-26 DIAGNOSIS — Z113 Encounter for screening for infections with a predominantly sexual mode of transmission: Secondary | ICD-10-CM | POA: Insufficient documentation

## 2017-07-26 DIAGNOSIS — Z79899 Other long term (current) drug therapy: Secondary | ICD-10-CM | POA: Diagnosis not present

## 2017-07-26 DIAGNOSIS — H6503 Acute serous otitis media, bilateral: Secondary | ICD-10-CM

## 2017-07-26 MED ORDER — BICTEGRAVIR-EMTRICITAB-TENOFOV 50-200-25 MG PO TABS: 1.0000 | ORAL_TABLET | Freq: Every day | ORAL | 6 refills | Status: DC

## 2017-07-26 MED ORDER — LOSARTAN POTASSIUM 100 MG PO TABS: 100.0000 mg | ORAL_TABLET | Freq: Every day | ORAL | 3 refills | Status: DC

## 2017-07-26 MED ORDER — AMLODIPINE BESYLATE 10 MG PO TABS: 10.0000 mg | ORAL_TABLET | Freq: Every day | ORAL | 3 refills | Status: DC

## 2017-07-26 NOTE — Assessment & Plan Note (Signed)
He is doing well Will check his labs today Has gotten flu shot Will give him condoms Will see him back in 6 months provided his LFTs are nl.

## 2017-07-26 NOTE — Assessment & Plan Note (Signed)
Had tubes placed, hearing still decreased.  Is going to make f/u appt with ENT.

## 2017-07-26 NOTE — Progress Notes (Signed)
Patient ID: Lee [name], male   DOB: 1981-01-30, 37 y.o.   MRN: [id number]

## 2017-07-26 NOTE — Assessment & Plan Note (Addendum)
No problems with norvasc, losartan, or with BP.  No constipation or headaches.

## 2017-07-26 NOTE — Progress Notes (Signed)
   Subjective:    Patient ID: Lee [name], male    DOB: 1980/10/23, 37 y.o.   MRN: [id number]  HPI 37 yo M with hx of HTN and HIV+ dx 08-03-16.  He had first visit 2-15 and was also found to have syphilis. He received IM PEN G on 2-22.  His genotype is naive. He is HLA negative.  Has been on triumeq but at 5-31 he had increased LFTs and Cr on labs. He had repeat labs done on 6-8 which confirmed this. He was seen in f/u with pharm 02-2017 and was changed to biktarvy. His Hep B S Ag, Hep C, toxo and CMV were all negative.  He has noted no difference with his new rx.    HIV 1 RNA Quant (copies/mL)  Date Value  03/09/2017 23 (H)  12/21/2016 26 (H)  10/17/2007 38200 (H)   CD4 T Cell Abs (/uL)  Date Value  12/21/2016 430  09/07/2016 280 (L)    Review of Systems  Constitutional: Negative for appetite change, chills, fever and unexpected weight change.  Gastrointestinal: Negative for constipation and diarrhea.  Genitourinary: Negative for difficulty urinating.  Neurological: Negative for headaches.  Psychiatric/Behavioral: Negative for dysphoric mood and sleep disturbance.  Please see HPI. All other systems reviewed and negative.      Objective:   Physical Exam  Constitutional: He appears well-developed and well-nourished.  HENT:  Mouth/Throat: No oropharyngeal exudate.  Eyes: EOM are normal. Pupils are equal, round, and reactive to light.  Neck: Neck supple.  Cardiovascular: Normal rate, regular rhythm and normal heart sounds.  Pulmonary/Chest: Effort normal and breath sounds normal.  Abdominal: Soft. Bowel sounds are normal. There is no tenderness. There is no rebound.  Musculoskeletal: Normal range of motion.  Lymphadenopathy:    He has no cervical adenopathy.  Psychiatric: He has a normal mood and affect.       Assessment & Plan:

## 2017-07-27 LAB — T-HELPER CELL (CD4) - (RCID CLINIC ONLY)
CD4 % Helper T Cell: 25 % — ABNORMAL LOW (ref 33–55)
CD4 T Cell Abs: 570 /uL (ref 400–2700)

## 2017-07-27 LAB — COMPREHENSIVE METABOLIC PANEL
AG Ratio: 0.7 (calc) — ABNORMAL LOW (ref 1.0–2.5)
ALT: 9 U/L (ref 9–46)
AST: 16 U/L (ref 10–40)
Albumin: 3.6 g/dL (ref 3.6–5.1)
Alkaline phosphatase (APISO): 81 U/L (ref 40–115)
BUN/Creatinine Ratio: 9 (calc) (ref 6–22)
BUN: 14 mg/dL (ref 7–25)
CO2: 25 mmol/L (ref 20–32)
Calcium: 8.9 mg/dL (ref 8.6–10.3)
Chloride: 105 mmol/L (ref 98–110)
Creat: 1.53 mg/dL — ABNORMAL HIGH (ref 0.60–1.35)
Globulin: 4.9 g/dL (calc) — ABNORMAL HIGH (ref 1.9–3.7)
Glucose, Bld: 87 mg/dL (ref 65–99)
Potassium: 4.7 mmol/L (ref 3.5–5.3)
Sodium: 136 mmol/L (ref 135–146)
Total Bilirubin: 0.6 mg/dL (ref 0.2–1.2)
Total Protein: 8.5 g/dL — ABNORMAL HIGH (ref 6.1–8.1)

## 2017-07-27 LAB — CBC
HCT: 40.5 % (ref 38.5–50.0)
Hemoglobin: 14.1 g/dL (ref 13.2–17.1)
MCH: 31 pg (ref 27.0–33.0)
MCHC: 34.8 g/dL (ref 32.0–36.0)
MCV: 89 fL (ref 80.0–100.0)
MPV: 9.5 fL (ref 7.5–12.5)
Platelets: 239 10*3/uL (ref 140–400)
RBC: 4.55 10*6/uL (ref 4.20–5.80)
RDW: 12.8 % (ref 11.0–15.0)
WBC: 7 10*3/uL (ref 3.8–10.8)

## 2017-07-27 LAB — LIPID PANEL
Cholesterol: 156 mg/dL (ref <200)
HDL: 31 mg/dL — ABNORMAL LOW (ref >40)
LDL Cholesterol (Calc): 110 mg/dL (calc) — ABNORMAL HIGH
Non-HDL Cholesterol (Calc): 125 mg/dL (calc) (ref <130)
Total CHOL/HDL Ratio: 5 (calc) — ABNORMAL HIGH (ref <5.0)
Triglycerides: 63 mg/dL (ref <150)

## 2017-07-27 LAB — RPR: RPR Ser Ql: REACTIVE — AB

## 2017-07-27 LAB — RPR TITER: RPR Titer: 1:16 {titer} — ABNORMAL HIGH

## 2017-07-27 LAB — FLUORESCENT TREPONEMAL AB(FTA)-IGG-BLD: Fluorescent Treponemal ABS: REACTIVE — AB

## 2017-07-30 LAB — URINE CYTOLOGY ANCILLARY ONLY
Chlamydia: NEGATIVE
Neisseria Gonorrhea: NEGATIVE

## 2017-07-30 LAB — HIV-1 RNA QUANT-NO REFLEX-BLD
HIV 1 RNA Quant: <20 copies/mL
HIV-1 RNA Quant, Log: <1.3 Log copies/mL

## 2017-08-02 ENCOUNTER — Encounter: Payer: Self-pay | Admitting: Infectious Diseases

## 2017-09-07 ENCOUNTER — Emergency Department (HOSPITAL_BASED_OUTPATIENT_CLINIC_OR_DEPARTMENT_OTHER): Payer: BLUE CROSS/BLUE SHIELD

## 2017-09-07 ENCOUNTER — Inpatient Hospital Stay (HOSPITAL_BASED_OUTPATIENT_CLINIC_OR_DEPARTMENT_OTHER)
Admission: EM | Admit: 2017-09-07 | Discharge: 2017-09-12 | DRG: 974 | Disposition: A | Discharge status: Home / Self Care | Payer: BLUE CROSS/BLUE SHIELD | Attending: Internal Medicine | Admitting: Internal Medicine

## 2017-09-07 DIAGNOSIS — E871 Hypo-osmolality and hyponatremia: Secondary | ICD-10-CM | POA: Diagnosis not present

## 2017-09-07 DIAGNOSIS — D696 Thrombocytopenia, unspecified: Secondary | ICD-10-CM | POA: Diagnosis present

## 2017-09-07 DIAGNOSIS — J189 Pneumonia, unspecified organism: Secondary | ICD-10-CM | POA: Diagnosis not present

## 2017-09-07 DIAGNOSIS — R6520 Severe sepsis without septic shock: Secondary | ICD-10-CM | POA: Diagnosis not present

## 2017-09-07 DIAGNOSIS — A419 Sepsis, unspecified organism: Secondary | ICD-10-CM | POA: Diagnosis present

## 2017-09-07 DIAGNOSIS — Z8249 Family history of ischemic heart disease and other diseases of the circulatory system: Secondary | ICD-10-CM | POA: Diagnosis not present

## 2017-09-07 DIAGNOSIS — D631 Anemia in chronic kidney disease: Secondary | ICD-10-CM | POA: Diagnosis not present

## 2017-09-07 DIAGNOSIS — Z888 Allergy status to other drugs, medicaments and biological substances status: Secondary | ICD-10-CM

## 2017-09-07 DIAGNOSIS — A409 Streptococcal sepsis, unspecified: Secondary | ICD-10-CM | POA: Diagnosis present

## 2017-09-07 DIAGNOSIS — Z21 Asymptomatic human immunodeficiency virus [HIV] infection status: Secondary | ICD-10-CM | POA: Diagnosis not present

## 2017-09-07 DIAGNOSIS — N189 Chronic kidney disease, unspecified: Secondary | ICD-10-CM | POA: Diagnosis not present

## 2017-09-07 DIAGNOSIS — N179 Acute kidney failure, unspecified: Secondary | ICD-10-CM | POA: Diagnosis not present

## 2017-09-07 DIAGNOSIS — N183 Chronic kidney disease, stage 3 (moderate): Secondary | ICD-10-CM | POA: Diagnosis not present

## 2017-09-07 DIAGNOSIS — R739 Hyperglycemia, unspecified: Secondary | ICD-10-CM | POA: Diagnosis not present

## 2017-09-07 DIAGNOSIS — R918 Other nonspecific abnormal finding of lung field: Secondary | ICD-10-CM | POA: Diagnosis not present

## 2017-09-07 DIAGNOSIS — E860 Dehydration: Secondary | ICD-10-CM | POA: Diagnosis not present

## 2017-09-07 DIAGNOSIS — E872 Acidosis, unspecified: Secondary | ICD-10-CM

## 2017-09-07 DIAGNOSIS — J1108 Influenza due to unidentified influenza virus with specified pneumonia: Secondary | ICD-10-CM | POA: Diagnosis present

## 2017-09-07 DIAGNOSIS — A4102 Sepsis due to Methicillin resistant Staphylococcus aureus: Secondary | ICD-10-CM | POA: Diagnosis not present

## 2017-09-07 DIAGNOSIS — R509 Fever, unspecified: Secondary | ICD-10-CM | POA: Diagnosis not present

## 2017-09-07 DIAGNOSIS — I10 Essential (primary) hypertension: Secondary | ICD-10-CM | POA: Diagnosis not present

## 2017-09-07 DIAGNOSIS — A403 Sepsis due to Streptococcus pneumoniae: Secondary | ICD-10-CM | POA: Diagnosis not present

## 2017-09-07 DIAGNOSIS — R109 Unspecified abdominal pain: Secondary | ICD-10-CM | POA: Diagnosis not present

## 2017-09-07 DIAGNOSIS — R079 Chest pain, unspecified: Secondary | ICD-10-CM | POA: Diagnosis not present

## 2017-09-07 DIAGNOSIS — J181 Lobar pneumonia, unspecified organism: Secondary | ICD-10-CM | POA: Diagnosis not present

## 2017-09-07 DIAGNOSIS — R17 Unspecified jaundice: Secondary | ICD-10-CM | POA: Diagnosis present

## 2017-09-07 DIAGNOSIS — J123 Human metapneumovirus pneumonia: Secondary | ICD-10-CM | POA: Diagnosis not present

## 2017-09-07 DIAGNOSIS — I361 Nonrheumatic tricuspid (valve) insufficiency: Secondary | ICD-10-CM | POA: Diagnosis not present

## 2017-09-07 DIAGNOSIS — J154 Pneumonia due to other streptococci: Secondary | ICD-10-CM | POA: Diagnosis present

## 2017-09-07 DIAGNOSIS — Z91048 Other nonmedicinal substance allergy status: Secondary | ICD-10-CM | POA: Diagnosis not present

## 2017-09-07 DIAGNOSIS — R0602 Shortness of breath: Secondary | ICD-10-CM

## 2017-09-07 DIAGNOSIS — I129 Hypertensive chronic kidney disease with stage 1 through stage 4 chronic kidney disease, or unspecified chronic kidney disease: Secondary | ICD-10-CM | POA: Diagnosis not present

## 2017-09-07 DIAGNOSIS — B20 Human immunodeficiency virus [HIV] disease: Secondary | ICD-10-CM | POA: Diagnosis present

## 2017-09-07 LAB — CBC WITH DIFFERENTIAL/PLATELET
Basophils Absolute: 0 10*3/uL (ref 0.0–0.1)
Basophils Relative: 0 %
Eosinophils Absolute: 0 10*3/uL (ref 0.0–0.7)
Eosinophils Relative: 0 %
HCT: 43.2 % (ref 39.0–52.0)
Hemoglobin: 15.4 g/dL (ref 13.0–17.0)
Lymphocytes Relative: 8 %
Lymphs Abs: 1.4 10*3/uL (ref 0.7–4.0)
MCH: 31.2 pg (ref 26.0–34.0)
MCHC: 35.6 g/dL (ref 30.0–36.0)
MCV: 87.4 fL (ref 78.0–100.0)
Monocytes Absolute: 0.7 10*3/uL (ref 0.1–1.0)
Monocytes Relative: 4 %
Neutro Abs: 15.7 10*3/uL — ABNORMAL HIGH (ref 1.7–7.7)
Neutrophils Relative %: 88 %
Platelets: 161 10*3/uL (ref 150–400)
RBC: 4.94 MIL/uL (ref 4.22–5.81)
RDW: 13.2 % (ref 11.5–15.5)
WBC: 17.8 10*3/uL — ABNORMAL HIGH (ref 4.0–10.5)

## 2017-09-07 LAB — COMPREHENSIVE METABOLIC PANEL
ALT: 19 U/L (ref 17–63)
AST: 37 U/L (ref 15–41)
Albumin: 3.2 g/dL — ABNORMAL LOW (ref 3.5–5.0)
Alkaline Phosphatase: 71 U/L (ref 38–126)
Anion gap: 11 (ref 5–15)
BUN: 33 mg/dL — ABNORMAL HIGH (ref 6–20)
CO2: 17 mmol/L — ABNORMAL LOW (ref 22–32)
Calcium: 8 mg/dL — ABNORMAL LOW (ref 8.9–10.3)
Chloride: 103 mmol/L (ref 101–111)
Creatinine, Ser: 3.41 mg/dL — ABNORMAL HIGH (ref 0.61–1.24)
GFR calc Af Amer: 25 mL/min — ABNORMAL LOW (ref >60)
GFR calc non Af Amer: 22 mL/min — ABNORMAL LOW (ref >60)
Glucose, Bld: 103 mg/dL — ABNORMAL HIGH (ref 65–99)
Potassium: 4.2 mmol/L (ref 3.5–5.1)
Sodium: 131 mmol/L — ABNORMAL LOW (ref 135–145)
Total Bilirubin: 2.4 mg/dL — ABNORMAL HIGH (ref 0.3–1.2)
Total Protein: 8.5 g/dL — ABNORMAL HIGH (ref 6.5–8.1)

## 2017-09-07 LAB — I-STAT CG4 LACTIC ACID, ED
Lactic Acid, Venous: 3.68 mmol/L (ref 0.5–1.9)
Lactic Acid, Venous: 3.65 mmol/L (ref 0.5–1.9)

## 2017-09-07 LAB — PROTIME-INR
INR: 1.55
Prothrombin Time: 18.4 seconds — ABNORMAL HIGH (ref 11.4–15.2)

## 2017-09-07 LAB — URINALYSIS, ROUTINE W REFLEX MICROSCOPIC
Bilirubin Urine: NEGATIVE
Glucose, UA: NEGATIVE mg/dL
Ketones, ur: NEGATIVE mg/dL
Leukocytes, UA: NEGATIVE
Nitrite: NEGATIVE
Protein, ur: NEGATIVE mg/dL
Specific Gravity, Urine: 1.015 (ref 1.005–1.030)
pH: 6 (ref 5.0–8.0)

## 2017-09-07 LAB — URINALYSIS, MICROSCOPIC (REFLEX): WBC, UA: NONE SEEN WBC/hpf (ref 0–5)

## 2017-09-07 LAB — PROCALCITONIN: Procalcitonin: 29.96 ng/mL

## 2017-09-07 LAB — APTT: aPTT: 38 seconds — ABNORMAL HIGH (ref 24–36)

## 2017-09-07 LAB — TSH: TSH: 1.773 u[IU]/mL (ref 0.350–4.500)

## 2017-09-07 LAB — CBG MONITORING, ED: Glucose-Capillary: 92 mg/dL (ref 65–99)

## 2017-09-07 LAB — TROPONIN I: Troponin I: <0.03 ng/mL (ref <0.03)

## 2017-09-07 MED ORDER — SODIUM CHLORIDE 0.9 % IV SOLN
2.0000 g | INTRAVENOUS | Status: DC
  Administered 2017-09-08: 2 g via INTRAVENOUS
  Filled 2017-09-07: qty 20

## 2017-09-07 MED ORDER — VANCOMYCIN HCL IN DEXTROSE 1-5 GM/200ML-% IV SOLN
1000.0000 mg | Freq: Once | INTRAVENOUS | Status: AC
  Administered 2017-09-07: 1000 mg via INTRAVENOUS
  Filled 2017-09-07: qty 200

## 2017-09-07 MED ORDER — SODIUM CHLORIDE 0.9 % IV BOLUS (SEPSIS)
1000.0000 mL | Freq: Once | INTRAVENOUS | Status: AC
  Administered 2017-09-07: 1000 mL via INTRAVENOUS

## 2017-09-07 MED ORDER — LACTATED RINGERS IV BOLUS (SEPSIS)
1000.0000 mL | Freq: Once | INTRAVENOUS | Status: AC
  Administered 2017-09-07: 1000 mL via INTRAVENOUS

## 2017-09-07 MED ORDER — BICTEGRAVIR-EMTRICITAB-TENOFOV 50-200-25 MG PO TABS
1.0000 | ORAL_TABLET | Freq: Every day | ORAL | Status: DC
  Administered 2017-09-08 – 2017-09-12 (×5): 1 via ORAL
  Filled 2017-09-07 (×5): qty 1

## 2017-09-07 MED ORDER — VANCOMYCIN HCL IN DEXTROSE 1-5 GM/200ML-% IV SOLN: 1000.0000 mg | Freq: Once | INTRAVENOUS | Status: DC

## 2017-09-07 MED ORDER — VANCOMYCIN HCL 500 MG IV SOLR
500.0000 mg | Freq: Once | INTRAVENOUS | Status: AC
  Administered 2017-09-07: 500 mg via INTRAVENOUS
  Filled 2017-09-07: qty 500

## 2017-09-07 MED ORDER — SODIUM CHLORIDE 0.9 % IV SOLN
2.0000 g | Freq: Once | INTRAVENOUS | Status: AC
  Administered 2017-09-07: 2 g via INTRAVENOUS

## 2017-09-07 MED ORDER — SODIUM CHLORIDE 0.9 % IV SOLN
INTRAVENOUS | Status: DC
  Administered 2017-09-07 – 2017-09-12 (×11): via INTRAVENOUS

## 2017-09-07 MED ORDER — LACTATED RINGERS IV SOLN
INTRAVENOUS | Status: DC
  Administered 2017-09-07: 20:00:00 via INTRAVENOUS

## 2017-09-07 MED ORDER — SODIUM CHLORIDE 0.9 % IV SOLN: 1.0000 g | INTRAVENOUS | Status: DC

## 2017-09-07 MED ORDER — VANCOMYCIN HCL IN DEXTROSE 1-5 GM/200ML-% IV SOLN
1000.0000 mg | Freq: Once | INTRAVENOUS | Status: AC
  Administered 2017-09-07: 1000 mg via INTRAVENOUS
  Filled 2017-09-07 (×2): qty 200

## 2017-09-07 MED ORDER — SODIUM CHLORIDE 0.9 % IV SOLN: 2.0000 g | INTRAVENOUS | Status: DC

## 2017-09-07 MED ORDER — VANCOMYCIN HCL IN DEXTROSE 1-5 GM/200ML-% IV SOLN
1000.0000 mg | Freq: Once | INTRAVENOUS | Status: DC
  Filled 2017-09-07: qty 200

## 2017-09-07 MED ORDER — VANCOMYCIN HCL 500 MG IV SOLR
500.0000 mg | Freq: Once | INTRAVENOUS | Status: DC
  Filled 2017-09-07: qty 500

## 2017-09-07 MED ORDER — SODIUM CHLORIDE 0.9 % IV SOLN
500.0000 mg | Freq: Every day | INTRAVENOUS | Status: DC
  Administered 2017-09-07: 500 mg via INTRAVENOUS
  Filled 2017-09-07 (×2): qty 500

## 2017-09-07 MED ORDER — VANCOMYCIN HCL 500 MG IV SOLR
INTRAVENOUS | Status: AC
  Filled 2017-09-07: qty 500

## 2017-09-07 MED ORDER — ENOXAPARIN SODIUM 40 MG/0.4ML ~~LOC~~ SOLN
40.0000 mg | Freq: Every day | SUBCUTANEOUS | Status: DC
  Administered 2017-09-07: 40 mg via SUBCUTANEOUS
  Filled 2017-09-07: qty 0.4

## 2017-09-07 MED ORDER — LORATADINE 10 MG PO TABS
10.0000 mg | ORAL_TABLET | Freq: Every day | ORAL | Status: DC
  Administered 2017-09-08 – 2017-09-12 (×5): 10 mg via ORAL
  Filled 2017-09-07 (×5): qty 1

## 2017-09-07 MED ORDER — CEFEPIME HCL 2 G IJ SOLR
INTRAMUSCULAR | Status: AC
  Filled 2017-09-07: qty 2

## 2017-09-07 MED ORDER — PIPERACILLIN-TAZOBACTAM 3.375 G IVPB 30 MIN
3.3750 g | Freq: Once | INTRAVENOUS | Status: DC
  Filled 2017-09-07: qty 50

## 2017-09-07 NOTE — ED Notes (Signed)
Pt taken to room 5 to complete triage. States he feels like passing out. EDP at bedside

## 2017-09-07 NOTE — ED Notes (Addendum)
ED Provider at bedside, Dr Nanavati 

## 2017-09-07 NOTE — Progress Notes (Addendum)
Received a Call from [facility] from Dr. Kathrynn Humble about Mr. Harkirat Orozco (DOB [birth year]) with a PMH of HIV +, who presented to Urgent Care on Tuesday and was evaluated and given Tamiflu. He presented to [facility] today with weakness, dehydration, exhaustion, and evaluated by Dr. Kathrynn Humble. Patient presented with a new Cough and was Septic and given 3 Liters of IVF and is about to get his 4 bag of NS. Patient was found to have a Lactate Level of 3.68 and a New AKI. WBC was 17.8. EDP Called ID who recommended No PCP Treatement but recommended IV Vancomycin and IV Ceftriaxone. EDP also called PCCM Dr. Vaughan Browner who felt patient did not require ICU care and recommended SDU Management. Patient was accepted to [facility] SDU for Severe Sepsis from PNA and ID will be consulted. Patient's BP has improved but remain soft after IVF Resuscitation.

## 2017-09-07 NOTE — ED Triage Notes (Signed)
Pt seen at Urgent Care on Tuesday and given tamiflu. Flu screen was negative. States he feels dehydrated and exhausted.

## 2017-09-07 NOTE — H&P (Signed)
Lee [name] WUJ:[id number] DOB: 1981-03-02 DOA: 09/07/2017     PCP: Shirline Frees, NP   Outpatient Specialists: ID  Hatcher   Patient coming from:   home Lives alone,     Chief Complaint: shortness of breath, cough   HPI: Lee [name] is a 37 y.o. male with medical history significant of HIV, HTN, syphilis treated      Presented with cough for the past 5 days. Reports having fevers up to 103.3 or chills diaphoresis cough is productive of yellow and thick sputum. NOse bleeding from his nose  possibly blood tinged sputum.He presented to urgent care was started on Tamiflu but later on influenza screen came back negative. He had decreased by mouth intake reports feeling dehydrated and exhausted. Reports taking all of his HIV medications.  Last CD4 >400 in January   While in ER: Meeting sepsis criteria lactic acid up to 3.68 WBC 17.8 started on broad-spectrum antibiotics including vancomycin and cefepime 1 dose and Rocephin 3 L of normal saline was given  Significant initial  Findings: Found to have history of present illness with creatinine of 3.41 at baseline 1.5 LA 3.68>3.65    CXR: Bilateral pulmonary opacities left greater than right acute pneumonia  ER provider discussed case with:   ID Who recommends: No PCP treatment  ER provider discussed case with:   PCCM  Who recommends: Admission to stepdown for severe sepsis  IN ER:  Temp (24hrs), Avg:100.4 F (38 C), Min:99.1 F (37.3 C), Max:101.1 F (38.4 C)      on arrival  ED Triage Vitals  Enc Vitals Group     BP 09/07/17 1530 (!) 85/55     Pulse Rate 09/07/17 1530 (!) 133     Resp 09/07/17 1530 (!) 28     Temp 09/07/17 1530 (!) 101.1 F (38.4 C)     Temp Source 09/07/17 1730 Oral     SpO2 09/07/17 1530 91 %     Weight 09/07/17 1531 279 lb (126.6 kg)     Height 09/07/17 1531 6\' 4"  (1.93 m)     Head Circumference --      Peak Flow --      Pain Score 09/07/17 1531 7     Pain Loc --      Pain Edu? --      Excl. in GC? --     Latest RR 33 95% HR 121 BP 105/64  Following Medications were ordered in ER: Medications  ceFEPIme (MAXIPIME) 2 g injection (not administered)  cefTRIAXone (ROCEPHIN) 2 g in sodium chloride 0.9 % 100 mL IVPB (not administered)  vancomycin (VANCOCIN) 500 MG powder (not administered)  lactated ringers infusion ( Intravenous New Bag/Given 09/07/17 2016)  sodium chloride 0.9 % bolus 1,000 mL (0 mLs Intravenous Stopped 09/07/17 1648)  ceFEPIme (MAXIPIME) 2 g in sodium chloride 0.9 % 100 mL IVPB (0 g Intravenous Stopped 09/07/17 1646)  vancomycin (VANCOCIN) IVPB 1000 mg/200 mL premix (0 mg Intravenous Stopped 09/07/17 1758)    Followed by  vancomycin (VANCOCIN) IVPB 1000 mg/200 mL premix (0 mg Intravenous Stopped 09/07/17 1923)    Followed by  vancomycin (VANCOCIN) 500 mg in sodium chloride 0.9 % 100 mL IVPB (500 mg Intravenous New Bag/Given 09/07/17 1935)  lactated ringers bolus 1,000 mL (0 mLs Intravenous Stopped 09/07/17 1813)  lactated ringers bolus 1,000 mL (0 mLs Intravenous Stopped 09/07/17 2012)      Hospitalist was called for admission for sepsis  Regarding pertinent Chronic problems:  History of HIV followed by ID clinic  Review of Systems:    Pertinent positives include: Fevers, chills, fatigue, excess mucus,   productive cough,  Constitutional:  No weight loss, night sweats,  weight loss  HEENT:  No headaches, Difficulty swallowing,Tooth/dental problems,Sore throat,  No sneezing, itching, ear ache, nasal congestion, post nasal drip,  Cardio-vascular:  No chest pain, Orthopnea, PND, anasarca, dizziness, palpitations.no Bilateral lower extremity swelling  GI:  No heartburn, indigestion, abdominal pain, nausea, vomiting, diarrhea, change in bowel habits, loss of appetite, melena, blood in stool, hematemesis Resp:  no shortness of breath at rest. No dyspnea on exertion, No No non-productive cough, No coughing up of blood.No change in color of mucus.No  wheezing. Skin:  no rash or lesions. No jaundice GU:  no dysuria, change in color of urine, no urgency or frequency. No straining to urinate.  No flank pain.  Musculoskeletal:  No joint pain or no joint swelling. No decreased range of motion. No back pain.  Psych:  No change in mood or affect. No depression or anxiety. No memory loss.  Neuro: no localizing neurological complaints, no tingling, no weakness, no double vision, no gait abnormality, no slurred speech, no confusion  As per HPI otherwise 10 point review of systems negative.   Past Medical History: Past Medical History:  Diagnosis Date  . Eczema   . Environmental allergies   . Essential hypertension   . HIV test positive (HCC)   . 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HCC) 07/24/2016  . Shingles    Past Surgical History:  Procedure Laterality Date  . TYMPANOSTOMY TUBE PLACEMENT    . WISDOM TOOTH EXTRACTION    . WRIST SURGERY       Social History:  Ambulatory   independently     reports that  has never smoked. he has never used smokeless tobacco. He reports that he does not drink alcohol or use drugs.  Allergies:   Allergies  Allergen Reactions  . Lisinopril     Dry cough    . Nickel Rash       Family History:   Family History  Problem Relation Age of Onset  . Hypercalcemia Mother   . Hypertension Mother   . Hypertension Father     Medications: Prior to Admission medications   Medication Sig Start Date End Date Taking? Authorizing Provider  amLODipine (NORVASC) 10 MG tablet Take 1 tablet (10 mg total) by mouth daily. 07/26/17   Ginnie Smart, MD  bictegravir-emtricitabine-tenofovir AF ([name]) 50-200-25 MG TABS tablet Take 1 tablet by mouth daily. 07/26/17   Ginnie Smart, MD  cetirizine (ZYRTEC ALLERGY) 10 MG tablet Take 1 tablet (10 mg total) by mouth daily. 08/03/16   Nafziger, Kandee Keen, NP  losartan (COZAAR) 100 MG tablet Take 1 tablet (100 mg total) by mouth daily. 07/26/17   Ginnie Smart, MD  triamcinolone cream (KENALOG) 0.1 % Apply 1 application topically 2 (two) times daily. Patient not taking: Reported on 07/26/2017 08/03/16   Shirline Frees, NP  dicyclomine (BENTYL) 20 MG tablet Take 1 tablet (20 mg total) by mouth 3 (three) times daily before meals. 11/03/14 02/13/15  Teressa Lower, NP  fluticasone (FLONASE) 50 MCG/ACT nasal spray Place 2 sprays into both nostrils daily. 11/03/13 11/04/14  Gordy Savers, MD  omeprazole (PRILOSEC) 20 MG capsule Take 1 capsule (20 mg total) by mouth daily. 11/04/14 02/13/15  Charm Rings, MD    Physical Exam: Patient Vitals for the past 24 hrs:  BP Temp Temp src Pulse Resp SpO2 Height Weight  09/07/17 2104 103/63 (!) 101.1 F (38.4 C) Oral (!) 122 (!) 38 97 % 6\' 4"  (1.93 m) 127.9 kg (281 lb 15.5 oz)  09/07/17 2000 105/64 - - (!) 121 (!) 33 95 % - -  09/07/17 1930 (!) 113/51 - - (!) 123 (!) 33 94 % - -  09/07/17 1900 107/63 - - (!) 118 (!) 35 94 % - -  09/07/17 1830 105/61 - - (!) 117 (!) 31 94 % - -  09/07/17 1800 100/66 - - (!) 118 (!) 35 97 % - -  09/07/17 1730 103/65 99.1 F (37.3 C) Oral (!) 117 (!) 36 93 % - -  09/07/17 1700 98/64 - - (!) 119 20 96 % - -  09/07/17 1630 (!) 87/56 - - (!) 117 (!) 23 96 % - -  09/07/17 1600 (!) 147/131 - - (!) 122 (!) 23 97 % - -  09/07/17 1531 - - - - - - 6\' 4"  (1.93 m) 126.6 kg (279 lb)  09/07/17 1530 (!) 85/55 (!) 101.1 F (38.4 C) - (!) 133 (!) 28 91 % - -    1. General:  in No Acute distress  well  -appearing 2. Psychological: Alert and  Oriented 3. Head/ENT:    Dry Mucous Membranes                          Head Non traumatic, neck supple                            Poor Dentition 4. SKIN:  decreased Skin turgor,  Skin clean Dry and intact no rash 5. Heart: Regular rate and rhythm no  Murmur, no Rub or gallop 6. Lungs:  o wheezes some  crackles   7. Abdomen: Soft,  non-tender, Non distended   8. Lower extremities: no clubbing, cyanosis, or edema 9. Neurologically Grossly  intact, moving all 4 extremities equally   10. MSK: Normal range of motion   body mass index is 34.32 kg/m.  Labs on Admission:   Labs on Admission: I have personally reviewed following labs and imaging studies  CBC: Recent Labs  Lab 09/07/17 1544  WBC 17.8*  NEUTROABS 15.7*  HGB 15.4  HCT 43.2  MCV 87.4  PLT 161   Basic Metabolic Panel: Recent Labs  Lab 09/07/17 1544  NA 131*  K 4.2  CL 103  CO2 17*  GLUCOSE 103*  BUN 33*  CREATININE 3.41*  CALCIUM 8.0*   GFR: Estimated Creatinine Clearance: 44.6 mL/min (A) (by C-G formula based on SCr of 3.41 mg/dL (H)). Liver Function Tests: Recent Labs  Lab 09/07/17 1544  AST 37  ALT 19  ALKPHOS 71  BILITOT 2.4*  PROT 8.5*  ALBUMIN 3.2*   No results for input(s): LIPASE, AMYLASE in the last 168 hours. No results for input(s): AMMONIA in the last 168 hours. Coagulation Profile: No results for input(s): INR, PROTIME in the last 168 hours. Cardiac Enzymes: Recent Labs  Lab 09/07/17 1544  TROPONINI <0.03   BNP (last 3 results) No results for input(s): PROBNP in the last 8760 hours. HbA1C: No results for input(s): HGBA1C in the last 72 hours. CBG: Recent Labs  Lab 09/07/17 1542  GLUCAP 92   Lipid Profile: No results for input(s): CHOL, HDL, LDLCALC, TRIG, CHOLHDL, LDLDIRECT in the last 72 hours. Thyroid Function Tests:  No results for input(s): TSH, T4TOTAL, FREET4, T3FREE, THYROIDAB in the last 72 hours. Anemia Panel: No results for input(s): VITAMINB12, FOLATE, FERRITIN, TIBC, IRON, RETICCTPCT in the last 72 hours. Urine analysis:    Component Value Date/Time   COLORURINE YELLOW 09/07/2017 1832   APPEARANCEUR CLEAR 09/07/2017 1832   LABSPEC 1.015 09/07/2017 1832   PHURINE 6.0 09/07/2017 1832   GLUCOSEU NEGATIVE 09/07/2017 1832   HGBUR TRACE (A) 09/07/2017 1832   BILIRUBINUR NEGATIVE 09/07/2017 1832   KETONESUR NEGATIVE 09/07/2017 1832   PROTEINUR NEGATIVE 09/07/2017 1832   UROBILINOGEN 0.2  12/12/2009 1811   NITRITE NEGATIVE 09/07/2017 1832   LEUKOCYTESUR NEGATIVE 09/07/2017 1832   Sepsis Labs: @LABRCNTIP (procalcitonin:4,lacticidven:4) )No results found for this or any previous visit (from the past 240 hour(s)).     UA   no evidence of UTI     No results found for: HGBA1C  Estimated Creatinine Clearance: 44.6 mL/min (A) (by C-G formula based on SCr of 3.41 mg/dL (H)).  BNP (last 3 results) No results for input(s): PROBNP in the last 8760 hours.   ECG REPORT  Independently reviewed Rate: 119  Rhythm: sinus tachy ST&T Change: No acute ischemic changes   QTC 396  Filed Weights   09/07/17 1531 09/07/17 2104  Weight: 126.6 kg (279 lb) 127.9 kg (281 lb 15.5 oz)     Cultures:    Component Value Date/Time   SDES WOUND 02/14/2014 1212   SPECREQUEST RIGHT AXILLA 02/14/2014 1212   CULT  02/14/2014 1212    MODERATE STAPHYLOCOCCUS AUREUS Note: RIFAMPIN AND GENTAMICIN SHOULD NOT BE USED AS SINGLE DRUGS FOR TREATMENT OF STAPH INFECTIONS. Performed at [facility]   REPTSTATUS 02/18/2014 FINAL 02/14/2014 1212     Radiological Exams on Admission: Dg Chest Port 1 View  Result Date: 09/07/2017 CLINICAL DATA:  Fever for 4-5 days.  Weakness.  HIV. EXAM: PORTABLE CHEST 1 VIEW COMPARISON:  02/27/2008. FINDINGS: Normal cardiomediastinal silhouette. BILATERAL pulmonary opacities, LEFT greater than RIGHT consistent with acute pneumonia. There may be a small LEFT effusion. No pneumothorax. Bones unremarkable. Worsening aeration from priors. IMPRESSION: BILATERAL pulmonary opacities, LEFT greater than RIGHT consistent with acute pneumonia. Given that the patient is HIV positive, it is possible that the observed infiltrates could be either a typical or opportunistic infection. Electronically Signed   By: Elsie Stain M.D.   On: 09/07/2017 15:58    Chart has been reviewed    Assessment/Plan  37 y.o. male with medical history significant of HIV, HTN, syphilis treated     Admitted for sepsis in a setting of community-acquired pneumonia  Present on Admission: . Sepsis (HCC) - Admit per Sepsis protocol likely source being  pneumonia,   - rehydrate with 49ml/kg  - initiate broad spectrum antibiotics Vancomycin and rocephin azithro  -  obtain blood cultures  - Obtain serial lactic acid  - Obtain procalcitonin level  - Admit and monitor vital signs closely  - PCCM has been consulted for e-link monitoring  Sepsis - Repeat Assessment  Performed at:    1:30  Vitals     Blood pressure (!) 100/53, pulse 95, temperature 100.1 F (37.8 C), temperature source Oral, resp. rate (!) 30, height 6\' 4"  (1.93 m), weight 127.9 kg (281 lb 15.5 oz), SpO2 95 %.  Heart:     Regular rate and rhythm and Tachycardic  Lungs:    Rhonchi  Capillary Refill:   <2 sec  Peripheral Pulse:   Radial pulse palpable  Skin:  Normal Color   . CAP (community acquired pneumonia) - . Her pneumonia protocol obtained blood and sputum cultures monitor and step down for increased work of breathing and sepsis Mild hemoptysis possibly drainage from the nose nurses secondary to pneumonia continue to monitor. . 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HCC) check CD4 count continue home medications   Other plan as per orders.  DVT prophylaxis:    scd  Code Status:  FULL CODE  as per patient     Family Communication:   Family not  at  Bedside    Disposition Plan:     To home once workup is complete and patient is stable                                                Consults called: ID aware    Admission status:    inpatient        Level of care    SDU      I have spent a total of66 min on this admission extra time was spent to discuss case with consultants  Lee [name] 09/08/2017, 2:04 AM   [facility]  Pager [phone]   after 2 AM please page floor coverage PA If 7AM-7PM, please contact the day team taking care of the patient  [email or web address]  Password  [email or web address]

## 2017-09-07 NOTE — Progress Notes (Addendum)
Pharmacy Antibiotic Note  Lee Eicher is a 37 y.o. male admitted on 09/07/2017 with dehydration and exhaustion. Work-up concerning for PNA and pharmacy has been consulted for Vancomycin and Rocephin dosing.   The patient is noted to have AKI with SCr 3.41 (BL 1.5) - will load with Vancomycin but hold subsequent doses for now while trending renal function.   Plan: 1. Vancomycin 2500 mg IV x 1 to load (to be given as 1g + 1g + 500 mg - RN aware) 2. Will not enter standing Vancomycin doses given the patient's AKI and will trend renal function and consider a level prior to the next dose 3. Rocephin 2g IV every 24 hours (next dose due on 2/16) 4. Will follow-up with renal function and labs for additional doses 5. Will continue to follow renal function, culture results, LOT, and antibiotic de-escalation plans   Height: 6\' 4"  (193 cm) Weight: 279 lb (126.6 kg) IBW/kg (Calculated) : 86.8  Temp (24hrs), Avg:101.1 F (38.4 C), Min:101.1 F (38.4 C), Max:101.1 F (38.4 C)  No results for input(s): WBC, CREATININE, LATICACIDVEN, VANCOTROUGH, VANCOPEAK, VANCORANDOM, GENTTROUGH, GENTPEAK, GENTRANDOM, TOBRATROUGH, TOBRAPEAK, TOBRARND, AMIKACINPEAK, AMIKACINTROU, AMIKACIN in the last 168 hours.  CrCl cannot be calculated (Patient's most recent lab result is older than the maximum 21 days allowed.).    Allergies  Allergen Reactions  . Lisinopril     Dry cough    . Nickel Rash    Antimicrobials this admission: Vanc 2/15 >> Cefepime 2/15 x 1 Rocephin 2/15 >>  Dose adjustments this admission: n/a  Microbiology results:  Thank you for allowing pharmacy to be a part of this patient's care.  Alycia Rossetti, PharmD, BCPS Clinical Pharmacist Pager: [phone] Clinical phone for 09/07/2017 : [phone] 09/07/2017 3:58 PM

## 2017-09-07 NOTE — ED Provider Notes (Signed)
[facility] EMERGENCY DEPARTMENT Provider Note   CSN: [id number] Arrival date & time: 09/07/17  1510     History   Chief Complaint Chief Complaint  Patient presents with  . Influenza  . Hypotension    HPI Lee [name] is a 37 y.o. male.  HPI  37 year old male with history of HIV comes in with chief complaint of shortness of breath, cough, weakness. Patient was recently seen at an urgent care, and started on Tamiflu with a negative rapid flu test.  Patient states that he has been sick for the last 5 days.  Initially patient just had cough, but now he is having congestion, weakness, fevers, diaphoresis, chills.  Cough is producing thick yellow and thick white phlegm.  Patient also has seen blood-tinged into his sputum.  Patient denies any headaches, chest pain, neck pain, UTI-like symptoms, abdominal pain, nausea, vomiting, diarrhea. Patient's last CD4 count was greater than 400.  Past Medical History:  Diagnosis Date  . Eczema   . Environmental allergies   . Essential hypertension   . HIV test positive ([place])   . 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 Shingles     Patient Active Problem List   Diagnosis Date Noted  . Sepsis ([place]) 09/07/2017  . Hepatitis B immune 09/20/2016  . Rash 09/07/2016  . Essential hypertension 08/03/2016  . Shingles 08/08/2011  . GASTROENTERITIS 12/28/2009  . TENDINITIS, LEFT WRIST 01/13/2009  . SYNOVITIS 04/09/2008  . 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[place]) 10/16/2007  . Allergic rhinitis 10/03/2007  . ABSCESS, SKIN 10/03/2007  . OM, ACUTE SEROUS 04/12/2007    Past Surgical History:  Procedure Laterality Date  . TYMPANOSTOMY TUBE PLACEMENT    . WISDOM TOOTH EXTRACTION    . WRIST SURGERY         Home Medications    Prior to Admission medications   Medication Sig Start Date End Date Taking? Authorizing Provider  amLODipine (NORVASC) 10 MG tablet Take 1 tablet (10 mg total) by mouth  daily. 07/26/17   Campbell Riches, MD  bictegravir-emtricitabine-tenofovir AF ([name]) 50-200-25 MG TABS tablet Take 1 tablet by mouth daily. 07/26/17   Campbell Riches, MD  cetirizine (ZYRTEC ALLERGY) 10 MG tablet Take 1 tablet (10 mg total) by mouth daily. 08/03/16   Nafziger, Tommi Rumps, NP  losartan (COZAAR) 100 MG tablet Take 1 tablet (100 mg total) by mouth daily. 07/26/17   Campbell Riches, MD  triamcinolone cream (KENALOG) 0.1 % Apply 1 application topically 2 (two) times daily. Patient not taking: Reported on 07/26/2017 08/03/16   [name], NP  dicyclomine (BENTYL) 20 MG tablet Take 1 tablet (20 mg total) by mouth 3 (three) times daily before meals. 11/03/14 02/13/15  Glendell Docker, NP  fluticasone (FLONASE) 50 MCG/ACT nasal spray Place 2 sprays into both nostrils daily. 11/03/13 11/04/14  Marletta Lor, MD  omeprazole (PRILOSEC) 20 MG capsule Take 1 capsule (20 mg total) by mouth daily. 11/04/14 02/13/15  Melony Overly, MD    Family History Family History  Problem Relation Age of Onset  . Hypercalcemia Mother   . Hypertension Mother   . Hypertension Father     Social History Social History   Tobacco Use  . Smoking status: Never Smoker  . Smokeless tobacco: Never Used  Substance Use Topics  . Alcohol use: No  . Drug use: No     Allergies   Lisinopril and Nickel   Review of Systems Review of Systems  Constitutional: Positive for activity change and fever.  Respiratory: Positive for cough and shortness of breath.   Cardiovascular: Positive for chest pain.  Allergic/Immunologic: Positive for immunocompromised state.  All other systems reviewed and are negative.    Physical Exam Updated Vital Signs BP 103/65   Pulse (!) 117   Temp (!) 101.1 F (38.4 C)   Resp (!) 36   Ht 6\' 4"  (1.93 m)   Wt 126.6 kg (279 lb)   SpO2 93%   BMI 33.96 kg/m   Physical Exam  Constitutional: He is oriented to person, place, and time. He appears well-developed.  HENT:    Head: Atraumatic.  Neck: Neck supple.  Cardiovascular: Normal rate.  Pulmonary/Chest: Effort normal and breath sounds normal. No respiratory distress.  Positive tachypnea, diffuse rhonchi, left worse than right  Musculoskeletal: He exhibits no edema or tenderness.  Neurological: He is alert and oriented to person, place, and time.  Skin: Skin is warm.  Nursing note and vitals reviewed.    ED Treatments / Results  Labs (all labs ordered are listed, but only abnormal results are displayed) Labs Reviewed  COMPREHENSIVE METABOLIC PANEL - Abnormal; Notable for the following components:      Result Value   Sodium 131 (*)    CO2 17 (*)    Glucose, Bld 103 (*)    BUN 33 (*)    Creatinine, Ser 3.41 (*)    Calcium 8.0 (*)    Total Protein 8.5 (*)    Albumin 3.2 (*)    Total Bilirubin 2.4 (*)    GFR calc non Af Amer 22 (*)    GFR calc Af Amer 25 (*)    All other components within normal limits  CBC WITH DIFFERENTIAL/PLATELET - Abnormal; Notable for the following components:   WBC 17.8 (*)    Neutro Abs 15.7 (*)    All other components within normal limits  I-STAT CG4 LACTIC ACID, ED - Abnormal; Notable for the following components:   Lactic Acid, Venous 3.68 (*)    All other components within normal limits  I-STAT CG4 LACTIC ACID, ED - Abnormal; Notable for the following components:   Lactic Acid, Venous 3.65 (*)    All other components within normal limits  CULTURE, BLOOD (ROUTINE X 2)  CULTURE, BLOOD (ROUTINE X 2)  CULTURE, EXPECTORATED SPUTUM-ASSESSMENT  RESPIRATORY PANEL BY PCR  TROPONIN I  URINALYSIS, ROUTINE W REFLEX MICROSCOPIC  INFLUENZA PANEL BY PCR (TYPE A & B)  CBG MONITORING, ED    EKG  EKG Interpretation  Date/Time:  Friday September 07 2017 15:43:35 EST Ventricular Rate:  119 PR Interval:    QRS Duration: 85 QT Interval:  281 QTC Calculation: 396 R Axis:   74 Text Interpretation:  Sinus tachycardia Ventricular premature complex Prominent P waves,  nondiagnostic ST elev, probable normal early repol pattern No acute changes Nonspecific ST and T wave abnormality Confirmed by Varney Biles ([id number]) on 09/07/2017 4:12:29 PM       Radiology Dg Chest Port 1 View  Result Date: 09/07/2017 CLINICAL DATA:  Fever for 4-5 days.  Weakness.  HIV. EXAM: PORTABLE CHEST 1 VIEW COMPARISON:  02/27/2008. FINDINGS: Normal cardiomediastinal silhouette. BILATERAL pulmonary opacities, LEFT greater than RIGHT consistent with acute pneumonia. There may be a small LEFT effusion. No pneumothorax. Bones unremarkable. Worsening aeration from priors. IMPRESSION: BILATERAL pulmonary opacities, LEFT greater than RIGHT consistent with acute pneumonia. Given that the patient is HIV positive, it is possible that the observed infiltrates could be  either a typical or opportunistic infection. Electronically Signed   By: Staci Righter M.D.   On: 09/07/2017 15:58   CXR was reviewed by me.  Procedures Procedures (including critical care time)  CRITICAL CARE Performed by: Leahanna Buser   Total critical care time: 53 minutes  Critical care time was exclusive of separately billable procedures and treating other patients.  Critical care was necessary to treat or prevent imminent or life-threatening deterioration.  Critical care was time spent personally by me on the following activities: development of treatment plan with patient and/or surrogate as well as nursing, discussions with consultants, evaluation of patient's response to treatment, examination of patient, obtaining history from patient or surrogate, ordering and performing treatments and interventions, ordering and review of laboratory studies, ordering and review of radiographic studies, pulse oximetry and re-evaluation of patient's condition.   Medications Ordered in ED Medications  ceFEPIme (MAXIPIME) 2 g injection (not administered)  vancomycin (VANCOCIN) IVPB 1000 mg/200 mL premix (0 mg Intravenous Stopped  09/07/17 1758)    Followed by  vancomycin (VANCOCIN) IVPB 1000 mg/200 mL premix (not administered)    Followed by  vancomycin (VANCOCIN) 500 mg in sodium chloride 0.9 % 100 mL IVPB (not administered)  cefTRIAXone (ROCEPHIN) 2 g in sodium chloride 0.9 % 100 mL IVPB (not administered)  lactated ringers bolus 1,000 mL (not administered)  sodium chloride 0.9 % bolus 1,000 mL (0 mLs Intravenous Stopped 09/07/17 1648)  ceFEPIme (MAXIPIME) 2 g in sodium chloride 0.9 % 100 mL IVPB (0 g Intravenous Stopped 09/07/17 1646)  lactated ringers bolus 1,000 mL (1,000 mLs Intravenous New Bag/Given 09/07/17 1650)     Initial Impression / Assessment and Plan / ED Course  I have reviewed the triage vital signs and the nursing notes.  Pertinent labs & imaging results that were available during my care of the patient were reviewed by me and considered in my medical decision making (see chart for details).  Clinical Course as of Sep 08 1751  Fri Sep 07, 2017  1753 Results from the ER workup discussed with the patient face to face and all questions answered to the best of my ability.  DG Chest Port 1 View [AN]  1753 Lactic Acid, Venous: (!!) 3.65 [AN]  1753 Fluid resuscitation started. Creatinine: (!) 3.41 [AN]  1753 Sepsis reassessment completed. Lactic Acid, Venous: (!!) 3.65 [AN]    Clinical Course User Index [AN] Varney Biles, MD    37 year old male with history of HIV comes in with chief complaint of cough, and influenza-like symptoms. Patient is noted to be tachycardic, tachypneic and febrile. Patient has bilateral rhonchus breath sounds, left worse than right.  Chest x-ray shows bilateral lower lung field infiltrates. Clinical concerns are high for pneumonia causing sepsis.  When patient arrived he was hypotensive, but he has since responded to the fluid bolus.  Blood pressure however continues to be labile.  Patient's initial lactate was greater than 3, and the repeat is in the same range.  Sepsis  pathway initiated.  Patient will get cefepime and vancomycin in the ER.  Patient will also receive 30 cc/kg of fluid.  Besides lactic acidosis, labs also indicated acute kidney injury. I spoke with Dr. Tommy Medal, infectious disease, and they agree with the antibiotic regimen.  We asked the question about covering patient for PCP pneumonia, however I do think that Bactrim can be deferred for now.  They also recommend moving forward giving patient ceftriaxone instead of cefepime.  Spoke with critical care, we  discussed that patient's blood pressure has been labile, and have dropped into the 64P systolic while in the ED.  They are aware that patient has received more than 2 L of IV fluid resuscitation.  At this time they are recommending that patient be admitted to stepdown ICU.   Final Clinical Impressions(s) / ED Diagnoses   Final diagnoses:  Severe sepsis ([place])  Community acquired pneumonia, unspecified laterality  Lactic acidosis    ED Discharge Orders    None       Varney Biles, MD 09/07/17 1753

## 2017-09-07 NOTE — ED Notes (Signed)
Report to Montine Circle, Therapist, sports on [place] at [facility]

## 2017-09-08 ENCOUNTER — Encounter (HOSPITAL_COMMUNITY): Payer: Self-pay

## 2017-09-08 ENCOUNTER — Inpatient Hospital Stay (HOSPITAL_COMMUNITY): Payer: BLUE CROSS/BLUE SHIELD

## 2017-09-08 ENCOUNTER — Other Ambulatory Visit: Payer: Self-pay

## 2017-09-08 DIAGNOSIS — N189 Chronic kidney disease, unspecified: Secondary | ICD-10-CM

## 2017-09-08 DIAGNOSIS — N179 Acute kidney failure, unspecified: Secondary | ICD-10-CM

## 2017-09-08 DIAGNOSIS — R509 Fever, unspecified: Secondary | ICD-10-CM

## 2017-09-08 DIAGNOSIS — Z21 Asymptomatic human immunodeficiency virus [HIV] infection status: Secondary | ICD-10-CM

## 2017-09-08 DIAGNOSIS — Z91048 Other nonmedicinal substance allergy status: Secondary | ICD-10-CM

## 2017-09-08 DIAGNOSIS — Z888 Allergy status to other drugs, medicaments and biological substances status: Secondary | ICD-10-CM

## 2017-09-08 DIAGNOSIS — R0602 Shortness of breath: Secondary | ICD-10-CM

## 2017-09-08 DIAGNOSIS — A4102 Sepsis due to Methicillin resistant Staphylococcus aureus: Secondary | ICD-10-CM

## 2017-09-08 DIAGNOSIS — J123 Human metapneumovirus pneumonia: Secondary | ICD-10-CM

## 2017-09-08 LAB — BLOOD CULTURE ID PANEL (REFLEXED)

## 2017-09-08 LAB — RESPIRATORY PANEL BY PCR

## 2017-09-08 LAB — COMPREHENSIVE METABOLIC PANEL
ALT: 16 U/L — ABNORMAL LOW (ref 17–63)
AST: 26 U/L (ref 15–41)
Albumin: 2.5 g/dL — ABNORMAL LOW (ref 3.5–5.0)
Alkaline Phosphatase: 64 U/L (ref 38–126)
Anion gap: 9 (ref 5–15)
BUN: 28 mg/dL — ABNORMAL HIGH (ref 6–20)
CO2: 17 mmol/L — ABNORMAL LOW (ref 22–32)
Calcium: 7.5 mg/dL — ABNORMAL LOW (ref 8.9–10.3)
Chloride: 108 mmol/L (ref 101–111)
Creatinine, Ser: 2.37 mg/dL — ABNORMAL HIGH (ref 0.61–1.24)
GFR calc Af Amer: 39 mL/min — ABNORMAL LOW (ref >60)
GFR calc non Af Amer: 34 mL/min — ABNORMAL LOW (ref >60)
Glucose, Bld: 115 mg/dL — ABNORMAL HIGH (ref 65–99)
Potassium: 4.2 mmol/L (ref 3.5–5.1)
Sodium: 134 mmol/L — ABNORMAL LOW (ref 135–145)
Total Bilirubin: 1.5 mg/dL — ABNORMAL HIGH (ref 0.3–1.2)
Total Protein: 7 g/dL (ref 6.5–8.1)

## 2017-09-08 LAB — VANCOMYCIN, RANDOM: Vancomycin Rm: 5

## 2017-09-08 LAB — CBC WITH DIFFERENTIAL/PLATELET
Basophils Absolute: 0 10*3/uL (ref 0.0–0.1)
Basophils Relative: 0 %
Eosinophils Absolute: 0 10*3/uL (ref 0.0–0.7)
Eosinophils Relative: 0 %
HCT: 38.9 % — ABNORMAL LOW (ref 39.0–52.0)
Hemoglobin: 13.6 g/dL (ref 13.0–17.0)
Lymphocytes Relative: 9 %
Lymphs Abs: 1.5 10*3/uL (ref 0.7–4.0)
MCH: 31 pg (ref 26.0–34.0)
MCHC: 35 g/dL (ref 30.0–36.0)
MCV: 88.6 fL (ref 78.0–100.0)
Monocytes Absolute: 0.5 10*3/uL (ref 0.1–1.0)
Monocytes Relative: 3 %
Neutro Abs: 15.1 10*3/uL — ABNORMAL HIGH (ref 1.7–7.7)
Neutrophils Relative %: 88 %
Platelets: 143 10*3/uL — ABNORMAL LOW (ref 150–400)
RBC: 4.39 MIL/uL (ref 4.22–5.81)
RDW: 13.2 % (ref 11.5–15.5)
WBC Morphology: INCREASED
WBC: 17.1 10*3/uL — ABNORMAL HIGH (ref 4.0–10.5)

## 2017-09-08 LAB — LACTIC ACID, PLASMA
Lactic Acid, Venous: 2.2 mmol/L (ref 0.5–1.9)
Lactic Acid, Venous: 2.9 mmol/L (ref 0.5–1.9)

## 2017-09-08 LAB — EXPECTORATED SPUTUM ASSESSMENT W GRAM STAIN, RFLX TO RESP C

## 2017-09-08 LAB — MRSA PCR SCREENING: MRSA by PCR: NEGATIVE

## 2017-09-08 MED ORDER — IPRATROPIUM BROMIDE 0.02 % IN SOLN
0.5000 mg | Freq: Four times a day (QID) | RESPIRATORY_TRACT | Status: DC
  Administered 2017-09-08 (×2): 0.5 mg via RESPIRATORY_TRACT
  Filled 2017-09-08 (×2): qty 2.5

## 2017-09-08 MED ORDER — ORAL CARE MOUTH RINSE
15.0000 mL | Freq: Two times a day (BID) | OROMUCOSAL | Status: DC
  Administered 2017-09-08 – 2017-09-11 (×4): 15 mL via OROMUCOSAL

## 2017-09-08 MED ORDER — FENTANYL CITRATE (PF) 100 MCG/2ML IJ SOLN
25.0000 ug | INTRAMUSCULAR | Status: DC | PRN
  Administered 2017-09-08 – 2017-09-09 (×2): 25 ug via INTRAVENOUS
  Filled 2017-09-08 (×2): qty 2

## 2017-09-08 MED ORDER — OXYCODONE-ACETAMINOPHEN 5-325 MG PO TABS: 1.0000 | ORAL_TABLET | Freq: Four times a day (QID) | ORAL | Status: DC | PRN

## 2017-09-08 MED ORDER — HEPARIN SODIUM (PORCINE) 5000 UNIT/ML IJ SOLN
5000.0000 [IU] | Freq: Three times a day (TID) | INTRAMUSCULAR | Status: DC
  Administered 2017-09-08 – 2017-09-12 (×12): 5000 [IU] via SUBCUTANEOUS
  Filled 2017-09-08 (×12): qty 1

## 2017-09-08 MED ORDER — LEVALBUTEROL HCL 0.63 MG/3ML IN NEBU
0.6300 mg | INHALATION_SOLUTION | Freq: Four times a day (QID) | RESPIRATORY_TRACT | Status: DC
  Administered 2017-09-08 (×2): 0.63 mg via RESPIRATORY_TRACT
  Filled 2017-09-08 (×2): qty 3

## 2017-09-08 MED ORDER — LEVALBUTEROL HCL 0.63 MG/3ML IN NEBU
0.6300 mg | INHALATION_SOLUTION | Freq: Three times a day (TID) | RESPIRATORY_TRACT | Status: DC
  Administered 2017-09-09 – 2017-09-11 (×9): 0.63 mg via RESPIRATORY_TRACT
  Filled 2017-09-08 (×9): qty 3

## 2017-09-08 MED ORDER — VANCOMYCIN HCL 10 G IV SOLR
1500.0000 mg | INTRAVENOUS | Status: DC
  Administered 2017-09-08: 1500 mg via INTRAVENOUS
  Filled 2017-09-08: qty 1500

## 2017-09-08 MED ORDER — SODIUM CHLORIDE 0.9 % IV BOLUS (SEPSIS)
500.0000 mL | Freq: Once | INTRAVENOUS | Status: AC
  Administered 2017-09-08: 500 mL via INTRAVENOUS

## 2017-09-08 MED ORDER — IPRATROPIUM BROMIDE 0.02 % IN SOLN
0.5000 mg | Freq: Three times a day (TID) | RESPIRATORY_TRACT | Status: DC
  Administered 2017-09-09 – 2017-09-11 (×9): 0.5 mg via RESPIRATORY_TRACT
  Filled 2017-09-08 (×9): qty 2.5

## 2017-09-08 MED ORDER — ACETAMINOPHEN 325 MG PO TABS: 650.0000 mg | ORAL_TABLET | Freq: Four times a day (QID) | ORAL | Status: DC | PRN

## 2017-09-08 MED ORDER — GUAIFENESIN ER 600 MG PO TB12
1200.0000 mg | ORAL_TABLET | Freq: Two times a day (BID) | ORAL | Status: DC
  Administered 2017-09-08 – 2017-09-12 (×9): 1200 mg via ORAL
  Filled 2017-09-08 (×9): qty 2

## 2017-09-08 NOTE — Plan of Care (Signed)
Day 1 labs improving

## 2017-09-08 NOTE — Progress Notes (Signed)
Pharmacy Antibiotic Note  Lee [name] is a 37 y.o. male admitted on 09/07/2017 with dehydration and exhaustion. Work-up concerning for PNA and pharmacy has been consulted for Vancomycin dosing.   The patient is noted to have AKI with SCr 3.41 (BL 1.5). Scr has decreased to 2.37 today (1.1 L of uop yesterday). Vancomycin random >24 hrs from last dose came back today at 5. WBC remains at 17. Ceftriaxone has been discontinued.  Plan: 1. Order vancomycin 1500 mg every 24 hours starting tonight 2. Will follow-up with renal function and labs for additional doses 3. Will continue to follow renal function, culture results, LOT, and antibiotic de-escalation plans   Height: 6\' 4"  (193 cm) Weight: 281 lb 15.5 oz (127.9 kg) IBW/kg (Calculated) : 86.8  Temp (24hrs), Avg:99.6 F (37.6 C), Min:97.9 F (36.6 C), Max:101.1 F (38.4 C)  Recent Labs  Lab 09/07/17 1544 09/07/17 1600 09/07/17 1740 09/07/17 2222 09/08/17 0057 09/08/17 1814  WBC 17.8*  --   --   --  17.1*  --   CREATININE 3.41*  --   --   --  2.37*  --   LATICACIDVEN  --  3.68* 3.65* 2.9* 2.2*  --   VANCORANDOM  --   --   --   --   --  5    Estimated Creatinine Clearance: 64.1 mL/min (A) (by C-G formula based on SCr of 2.37 mg/dL (H)).    Allergies  Allergen Reactions  . Lisinopril Other (See Comments)    Dry cough    . Nickel Rash    Antimicrobials this admission: Vanc 2/15 >> Cefepime 2/15 x 1 Rocephin 2/15 >>2/16  Dose adjustments this admission: VR=5 on 2/16 >> schedule 1500 mg every 24 hours  Microbiology results: 2/16 BCx x2: sent 2/15 BCID: coag neg staph w/ methicillin resistance 2/15 BCx x2: GPC in 2/4 2/15 Resp panel: + metapneumovirus 2/15 MRSA PCR: neg 2/15 Sputum: mod GNR, abundant GPC  Thank you for allowing pharmacy to be a part of this patient's care.  Doylene Canard, PharmD Clinical Pharmacist  Pager: [phone] Clinical Phone for 09/08/2017 until 3:30pm: [phone] If after 3:30pm, please  call main pharmacy at [phone] 09/08/2017 8:59 PM

## 2017-09-08 NOTE — Progress Notes (Signed)
PROGRESS NOTE    Lee [name]  ZOX:[id number] DOB: Jul 18, 1981 DOA: 09/07/2017 PCP: [name], NP   Brief Narrative:  Lee [name] is a 37 y.o. male with medical history significant of HIV, HTN, syphilis treated and other comorbids who presented with a cough for the past 5 days. Reports having fevers up to 103.3 and chills with diaphoresis. Cough is productive of yellow and thick sputum. Had bleeding from his nose and possibly blood tinged sputum. He presented to urgent care was started on Tamiflu but later on influenza screen came back negative. He had decreased oral intake reports feeling dehydrated and exhausted. Reports taking all of his HIV medications. Last CD4 >400 in January. Was found to be Septic and CXR revealed Bilateral PNA. Sepsis protocol initiated and Started on IV Abx. Case was Discussed with ID who did not feel as if it was PCP Pneumonia. Patient admitted to SDU and is currently being treated.   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 Cannot exclude component of left pleural fluid. Cardiomediastinal silhouette and remainder of the exam is unchanged. -C/w Empiric Abx of IV Vancomycin, IV Ceftriaxone, and IV Azithromycin -S/p 3 Liters of IVF boluses; C/w NS at 125 mL/hr -Added Breathing Treatments with Xopenex/Atrovent -C/w Guaifenesin -Follow Blood Cx; 1/2 Cx's showed Gram Positive Cocci with Sensitivities pending -Repeat Blood Cx in AM  -Check Strep Pneumo Urine Ag -Discussed Case with ID who feels that is not PCP Pneumonia and recommended continuing current Abx Regimen  -Procalcitonin was 29.96 -Lactic Acid Level  decreasing from 3.68 -> 3.65 -> 2.9 -> 2.2 -WBC went from 17.8 -> 17.1 -May consider IV Steroids -Check Sputum Cx -Repeat CXR in AM and if worsening will get CT Chest w/o Contrast -Repeat CBC in AM  Lactic Acidosis -From Sepsis and Dehydration from poor oral intake  -Improving. Lactic Acid Level decreasing from 3.68 -> 3.65 -> 2.9 -> 2.2 -C/w IVF Rehydration  AKI on CKD Stage 3 -Baseline Cr raging from 1.1-1.5 -BUN/Cr on Admission was 33/3.41 and now improved to 28/2.37 -C/w IVF Rehydration with NS at 125 mL/hr -Avoid Nephrotoxic Medications as possible -Repeat CMP in AM   HIV -Patient states he is compliant with Medications -Last CD4 Count 07/26/17 was 570; Viral Load was <20 -Repeat this Hospitalization -C/w Bictegravir-Emtricitabine-Tenofovir 50-200-25 mg 1 tab po Daily  -Discussed with ID and Dr. Tommy Medal likely to see patient in AM   Hyperbilirubinemia -Patient's T Bili went from 2.4 -> 1.5 -Continue to Monitor and Repeat CMP in AM  Hyponatremia, improving -Patient's Na+ was 131 and improved to 134 -Continue IVF Rehydration and Repeat CMP in AM  Hyperglycemia -Maybe Reactive in the Setting of Sepsis and Infection -Check HbA1c -Continue to Monitor BS and if >200 start Sensitive Novolog SSI  Thrombocytopenia -Patient's Platelet Count went from 161 -> 143 -Likely in the Setting of Infection -Continue to Monitor for S/Sx of Bleeding -Repeat CBC in AM  DVT prophylaxis: Heparin 5,000 units sq q8h Code Status: FULL CODE Family Communication: No family present at bedside  Disposition Plan: Remain in SDU for now and possible transfer to Telemetry in AM if improving   Consultants:   Discussed Case  with ID Physician Dr. Dietrich Pates [facility] was consulted by EDP who felt patient did not meet ICU Criteria.    Procedures: None   Antimicrobials:  Anti-infectives (From admission, onward)   Start     Dose/Rate Route Frequency Ordered Stop   09/08/17 1800  ceFEPIme  (MAXIPIME) 2 g in sodium chloride 0.9 % 100 mL IVPB  Status:  Discontinued     2 g 200 mL/hr over 30 Minutes Intravenous Every 24 hours 09/07/17 1637 09/07/17 1648   09/08/17 1800  cefTRIAXone (ROCEPHIN) 2 g in sodium chloride 0.9 % 100 mL IVPB     2 g 200 mL/hr over 30 Minutes Intravenous Every 24 hours 09/07/17 1648     09/08/17 1000  bictegravir-emtricitabine-tenofovir AF ([name]) 50-200-25 MG per tablet 1 tablet     1 tablet Oral Daily 09/07/17 2200     09/07/17 2300  azithromycin (ZITHROMAX) 500 mg in sodium chloride 0.9 % 250 mL IVPB     500 mg 250 mL/hr over 60 Minutes Intravenous Daily at bedtime 09/07/17 2200 09/14/17 2159   09/07/17 2215  cefTRIAXone (ROCEPHIN) 1 g in sodium chloride 0.9 % 100 mL IVPB  Status:  Discontinued     1 g 200 mL/hr over 30 Minutes Intravenous Every 24 hours 09/07/17 2200 09/07/17 2203   09/07/17 1928  vancomycin (VANCOCIN) 500 MG powder    Comments:  Peel, Adrienne   : cabinet override      09/07/17 1928 09/08/17 0729   09/07/17 1900  vancomycin (VANCOCIN) 500 mg in sodium chloride 0.9 % 100 mL IVPB     500 mg 100 mL/hr over 60 Minutes Intravenous  Once 09/07/17 1646 09/07/17 2035   09/07/17 1830  vancomycin (VANCOCIN) 500 mg in sodium chloride 0.9 % 100 mL IVPB  Status:  Discontinued     500 mg 100 mL/hr over 60 Minutes Intravenous  Once 09/07/17 1603 09/07/17 1611   09/07/17 1800  vancomycin (VANCOCIN) IVPB 1000 mg/200 mL premix     1,000 mg 200 mL/hr over 60 Minutes Intravenous  Once 09/07/17 1646 09/07/17 1923   09/07/17 1730  vancomycin (VANCOCIN) IVPB 1000 mg/200 mL premix  Status:  Discontinued     1,000 mg 200 mL/hr over 60 Minutes Intravenous  Once 09/07/17 1603 09/07/17 1611   09/07/17 1700  vancomycin (VANCOCIN) IVPB 1000 mg/200 mL premix     1,000 mg 200 mL/hr over 60 Minutes Intravenous  Once 09/07/17 1646 09/07/17 1758   09/07/17 1630  vancomycin (VANCOCIN) IVPB 1000 mg/200 mL premix  Status:  Discontinued     1,000 mg 200 mL/hr  over 60 Minutes Intravenous  Once 09/07/17 1603 09/07/17 1611   09/07/17 1605  ceFEPIme (MAXIPIME) 2 g injection    Comments:  Peel, Adrienne   : cabinet override      09/07/17 1605 09/08/17 0414   09/07/17 1545  vancomycin (VANCOCIN) IVPB 1000 mg/200 mL premix  Status:  Discontinued     1,000 mg 200 mL/hr over 60 Minutes Intravenous  Once 09/07/17 1541 09/07/17 1542   09/07/17 1545  piperacillin-tazobactam (ZOSYN) IVPB 3.375 g  Status:  Discontinued     3.375 g 100 mL/hr over 30 Minutes Intravenous  Once 09/07/17 1541 09/07/17 1542   09/07/17 1545  ceFEPIme (MAXIPIME) 2 g in sodium chloride 0.9 % 100 mL IVPB     2 g 200 mL/hr over 30 Minutes Intravenous  Once 09/07/17 1542 09/07/17 1646   09/07/17 1545  vancomycin (VANCOCIN) IVPB 1000  mg/200 mL premix  Status:  Discontinued     1,000 mg 200 mL/hr over 60 Minutes Intravenous  Once 09/07/17 1542 09/07/17 1602     Subjective: Seen and examined and was still feeling bad but felt better than yesterday. Still coughing and had some chest soreness from it. No nausea or vomiting. No lightheadedness or dizziness.   Objective: Vitals:   09/08/17 0443 09/08/17 0843 09/08/17 1200 09/08/17 1516  BP:  114/80 101/87   Pulse: (!) 108 (!) 111 (!) 114   Resp: (!) 23 (!) 25 20   Temp: 97.9 F (36.6 C) 98.7 F (37.1 C) 99.5 F (37.5 C)   TempSrc: Oral Oral Oral   SpO2: 96% 97% 95% 98%  Weight:      Height:        Intake/Output Summary (Last 24 hours) at 09/08/2017 1545 Last data filed at 09/08/2017 1343 Gross per 24 hour  Intake 5769.98 ml  Output 3225 ml  Net 2544.98 ml   Filed Weights   09/07/17 1531 09/07/17 2104  Weight: 126.6 kg (279 lb) 127.9 kg (281 lb 15.5 oz)   Examination: Physical Exam:  Constitutional: WN/WD AAM in NAD and appears calm and comfortable Eyes:  Lids and conjunctivae normal, sclerae anicteric  ENMT: External Ears, Nose appear normal. Grossly normal hearing. Mucous membranes are moist. Posterior pharynx clear of  any exudate or lesions. Normal dentition.  Neck: Appears normal, supple, no cervical masses, normal ROM, no appreciable thyromegaly Respiratory: Diminished to auscultation bilaterally in the bases with some rales and rhonchi. No appreciable wheezing or crackles. Normal respiratory effort and patient is not tachypenic. No accessory muscle use. Wearing Supplemental O2 via .  Cardiovascular: Tachycardic Rate, no murmurs / rubs / gallops. S1 and S2 auscultated. No extremity edema. Abdomen: Soft, non-tender, non-distended. No masses palpated. No appreciable hepatosplenomegaly. Bowel sounds positive x4.  GU: Deferred. Musculoskeletal: No clubbing / cyanosis of digits/nails. Normal strength and muscle tone.  Skin: No rashes, lesions, ulcers on a limited skin eval. No induration; Warm and dry.  Neurologic: CN 2-12 grossly intact with no focal deficits. Romberg sign and cerebellar reflexes not assessed.  Psychiatric: Normal judgment and insight. Alert and oriented x 3. Normal mood and appropriate affect.   Data Reviewed: I have personally reviewed following labs and imaging studies  CBC: Recent Labs  Lab 09/07/17 1544 09/08/17 0057  WBC 17.8* 17.1*  NEUTROABS 15.7* 15.1*  HGB 15.4 13.6  HCT 43.2 38.9*  MCV 87.4 88.6  PLT 161 527*   Basic Metabolic Panel: Recent Labs  Lab 09/07/17 1544 09/08/17 0057  NA 131* 134*  K 4.2 4.2  CL 103 108  CO2 17* 17*  GLUCOSE 103* 115*  BUN 33* 28*  CREATININE 3.41* 2.37*  CALCIUM 8.0* 7.5*   GFR: Estimated Creatinine Clearance: 64.1 mL/min (A) (by C-G formula based on SCr of 2.37 mg/dL (H)). Liver Function Tests: Recent Labs  Lab 09/07/17 1544 09/08/17 0057  AST 37 26  ALT 19 16*  ALKPHOS 71 64  BILITOT 2.4* 1.5*  PROT 8.5* 7.0  ALBUMIN 3.2* 2.5*   No results for input(s): LIPASE, AMYLASE in the last 168 hours. No results for input(s): AMMONIA in the last 168 hours. Coagulation Profile: Recent Labs  Lab 09/07/17 2222  INR 1.55    Cardiac Enzymes: Recent Labs  Lab 09/07/17 1544  TROPONINI <0.03   BNP (last 3 results) No results for input(s): PROBNP in the last 8760 hours. HbA1C: No results for input(s): HGBA1C in  the last 72 hours. CBG: Recent Labs  Lab 09/07/17 1542  GLUCAP 92   Lipid Profile: No results for input(s): CHOL, HDL, LDLCALC, TRIG, CHOLHDL, LDLDIRECT in the last 72 hours. Thyroid Function Tests: Recent Labs    09/07/17 2235  TSH 1.773   Anemia Panel: No results for input(s): VITAMINB12, FOLATE, FERRITIN, TIBC, IRON, RETICCTPCT in the last 72 hours. Sepsis Labs: Recent Labs  Lab 09/07/17 1600 09/07/17 1740 09/07/17 2222 09/08/17 0057  PROCALCITON  --   --  29.96  --   LATICACIDVEN 3.68* 3.65* 2.9* 2.2*    Recent Results (from the past 240 hour(s))  Blood Culture (routine x 2)     Status: None (Preliminary result)   Collection Time: 09/07/17  3:40 PM  Result Value Ref Range Status   Specimen Description   Final    BLOOD LEFT ARM Performed at [facility], [address] [place], [place] [address]    Special Requests   Final    BOTTLES DRAWN AEROBIC AND ANAEROBIC Blood Culture adequate volume Performed at [facility], [address] [place], [place] [address]    Culture  Setup Time   Final    GRAM POSITIVE COCCI IN BOTH AEROBIC AND ANAEROBIC BOTTLES Organism ID to follow Performed at [facility], [place] [address] [place], [place] [address]    Culture GRAM POSITIVE COCCI  Final   Report Status PENDING  Incomplete  Blood Culture (routine x 2)     Status: None (Preliminary result)   Collection Time: 09/07/17  3:40 PM  Result Value Ref Range Status   Specimen Description   Final    BLOOD RIGHT HAND Performed at [facility], [address] [place], [place] [address]    Special Requests   Final    BOTTLES DRAWN AEROBIC AND ANAEROBIC Blood Culture adequate volume Performed at [facility], [address] [place], [place] [address]    Culture   Final    NO GROWTH < 24 HOURS Performed at [facility], [place] [address] [place], [place] [address]    Report Status PENDING  Incomplete  Culture, expectorated sputum-assessment     Status: None (Preliminary result)   Collection Time: 09/07/17  4:20 PM  Result Value Ref Range Status   Specimen Description SPUTUM  Final   Special Requests   Final    Immunocompromised Performed at [facility], [address] [place], [place] [address]    Sputum evaluation THIS SPECIMEN IS ACCEPTABLE FOR SPUTUM CULTURE  Final   Report Status PENDING  Incomplete  Culture, respiratory (NON-Expectorated)     Status: None (Preliminary result)   Collection Time: 09/07/17  4:20 PM  Result Value Ref Range Status   Specimen Description SPUTUM  Final   Special Requests Immunocompromised Reflexed from [id number]  Final   Gram Stain   Final    ABUNDANT WBC PRESENT, PREDOMINANTLY PMN ABUNDANT GRAM POSITIVE COCCI MODERATE GRAM NEGATIVE RODS    Culture PENDING  Incomplete   Report Status PENDING  Incomplete  Respiratory Panel by PCR     Status: Abnormal   Collection Time: 09/07/17  6:00 PM  Result Value Ref Range Status   Adenovirus NOT DETECTED NOT DETECTED Final   Coronavirus 229E NOT DETECTED NOT DETECTED Final   Coronavirus HKU1 NOT DETECTED NOT DETECTED Final   Coronavirus NL63 NOT DETECTED NOT DETECTED Final   Coronavirus OC43  NOT DETECTED NOT DETECTED Final   Metapneumovirus DETECTED (A) NOT DETECTED Final   Rhinovirus / Enterovirus NOT DETECTED NOT DETECTED Final   Influenza A NOT DETECTED NOT DETECTED Final   Influenza B NOT DETECTED NOT DETECTED Final   Parainfluenza Virus 1 NOT DETECTED NOT DETECTED Final   Parainfluenza Virus 2 NOT DETECTED NOT DETECTED Final   Parainfluenza Virus 3 NOT DETECTED NOT DETECTED Final   Parainfluenza Virus 4 NOT DETECTED NOT DETECTED Final   Respiratory Syncytial Virus NOT DETECTED NOT DETECTED Final   Bordetella  pertussis NOT DETECTED NOT DETECTED Final   Chlamydophila pneumoniae NOT DETECTED NOT DETECTED Final   Mycoplasma pneumoniae NOT DETECTED NOT DETECTED Final    Comment: Performed at [facility], [place] [address], [place], [place] [address]  MRSA PCR Screening     Status: None   Collection Time: 09/07/17  9:04 PM  Result Value Ref Range Status   MRSA by PCR NEGATIVE NEGATIVE Final    Comment:        The GeneXpert MRSA Assay (FDA approved for NASAL specimens only), is one component of a comprehensive MRSA colonization surveillance program. It is not intended to diagnose MRSA infection nor to guide or monitor treatment for MRSA infections. Performed at [facility], [place] [address] [place], [place] [address]     Radiology Studies: Dg Chest Port 1 View  Result Date: 09/08/2017 CLINICAL DATA:  Shortness of breath. EXAM: PORTABLE CHEST 1 VIEW COMPARISON:  09/07/2017 FINDINGS: Lungs are adequately inflated with continued bibasilar airspace opacification likely infection. Cannot exclude component of left pleural fluid. Cardiomediastinal silhouette and remainder of the exam is unchanged. IMPRESSION: Stable bibasilar airspace process likely pneumonia. Possible small amount of left pleural fluid. Electronically Signed   By: Marin Olp M.D.   On: 09/08/2017 10:48   Dg Chest Port 1 View  Result Date: 09/07/2017 CLINICAL DATA:  Fever for 4-5 days.  Weakness.  HIV. EXAM: PORTABLE CHEST 1 VIEW COMPARISON:  02/27/2008. FINDINGS: Normal cardiomediastinal silhouette. BILATERAL pulmonary opacities, LEFT greater than RIGHT consistent with acute pneumonia. There may be a small LEFT effusion. No pneumothorax. Bones unremarkable. Worsening aeration from priors. IMPRESSION: BILATERAL pulmonary opacities, LEFT greater than RIGHT consistent with acute pneumonia. Given that the patient is HIV positive, it is possible that the observed infiltrates could be either a typical or opportunistic infection.  Electronically Signed   By: Staci Righter M.D.   On: 09/07/2017 15:58   Scheduled Meds: . bictegravir-emtricitabine-tenofovir AF  1 tablet Oral Daily  . guaiFENesin  1,200 mg Oral BID  . heparin injection (subcutaneous)  5,000 Units Subcutaneous Q8H  . ipratropium  0.5 mg Nebulization Q6H  . levalbuterol  0.63 mg Nebulization Q6H  . loratadine  10 mg Oral Daily  . mouth rinse  15 mL Mouth Rinse BID   Continuous Infusions: . sodium chloride 125 mL/hr at 09/08/17 1343  . azithromycin Stopped (09/08/17 0023)  . cefTRIAXone (ROCEPHIN)  IV    . lactated ringers Stopped (09/07/17 2238)    LOS: 1 day   Kerney Elbe, DO [facility] Pager [phone]  If 7PM-7AM, please contact night-coverage [email or web address] Password [facility] 09/08/2017, 3:45 PM

## 2017-09-08 NOTE — Progress Notes (Signed)
PHARMACY - PHYSICIAN COMMUNICATION CRITICAL VALUE ALERT - BLOOD CULTURE IDENTIFICATION (BCID)  Results for orders placed or performed during the hospital encounter of 09/07/17  Blood Culture ID Panel (Reflexed) (Collected: 09/07/2017  3:40 PM)  Result Value Ref Range   Enterococcus species NOT DETECTED NOT DETECTED   Listeria monocytogenes NOT DETECTED NOT DETECTED   Staphylococcus species DETECTED (A) NOT DETECTED   Staphylococcus aureus NOT DETECTED NOT DETECTED   Methicillin resistance DETECTED (A) NOT DETECTED   Streptococcus species NOT DETECTED NOT DETECTED   Streptococcus agalactiae NOT DETECTED NOT DETECTED   Streptococcus pneumoniae NOT DETECTED NOT DETECTED   Streptococcus pyogenes NOT DETECTED NOT DETECTED   Acinetobacter baumannii NOT DETECTED NOT DETECTED   Enterobacteriaceae species NOT DETECTED NOT DETECTED   Enterobacter cloacae complex NOT DETECTED NOT DETECTED   Escherichia coli NOT DETECTED NOT DETECTED   Klebsiella oxytoca NOT DETECTED NOT DETECTED   Klebsiella pneumoniae NOT DETECTED NOT DETECTED   Proteus species NOT DETECTED NOT DETECTED   Serratia marcescens NOT DETECTED NOT DETECTED   Haemophilus influenzae NOT DETECTED NOT DETECTED   Neisseria meningitidis NOT DETECTED NOT DETECTED   Pseudomonas aeruginosa NOT DETECTED NOT DETECTED   Candida albicans NOT DETECTED NOT DETECTED   Candida glabrata NOT DETECTED NOT DETECTED   Candida krusei NOT DETECTED NOT DETECTED   Candida parapsilosis NOT DETECTED NOT DETECTED   Candida tropicalis NOT DETECTED NOT DETECTED    Name of physician (or Provider) Contacted: Sheikh  Changes to prescribed antibiotics required: None likely contaminant   Levester Fresh, PharmD, BCPS, BCCCP Clinical Pharmacist Clinical phone for 09/08/2017 from 7a-3:30p: [phone] If after 3:30p, please call main pharmacy at: [phone] 09/08/2017 4:20 PM

## 2017-09-08 NOTE — Consult Note (Signed)
Date of Admission:  09/07/2017          Reason for Consult: Fever, sepsis and pneumonia in HIV + individual   Referring Provider: Dr. Alfredia Ferguson   Assessment: 1.  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  Sepsis due to #1\ 3.  Metapneumovirus infection 4.  Well-controlled HIV on [name] 5.  Acute on chronic kidney disease  Plan: 1. Narrowed to vancomycin 2. Repeat blood cultures 3. If this is a Staphylococcus looked in it says he will need a transesophageal echocardiogram and one may still need to consider it given that there is not a clear source beyond the lungs 4. 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 bictegravir-emtricitabine-tenofovir AF  1 tablet Oral Daily  . guaiFENesin  1,200 mg Oral BID  . heparin injection (subcutaneous)  5,000 Units Subcutaneous Q8H  . ipratropium  0.5 mg Nebulization Q6H  . levalbuterol  0.63 mg Nebulization Q6H  . loratadine  10 mg Oral Daily  . mouth rinse  15 mL Mouth Rinse BID   Continuous Infusions: . sodium chloride 125 mL/hr at 09/08/17 1343  . azithromycin Stopped (09/08/17 0023)  . cefTRIAXone (ROCEPHIN)  IV 2 g (09/08/17 1732)   PRN Meds:.acetaminophen, fentaNYL (SUBLIMAZE) injection, oxyCODONE-acetaminophen  HPI: Lee [name] is a 37 y.o. male HIV disease that is perfectly controlled on [name] who is followed by Dr. Johnnye Sima.  He was doing well until last Sunday when he developed fevers up to 103 as long with body aches chills cough.  He was seen in urgent care he was tested for influenza I believe via a rapid test.  He was given a prescription for Tamiflu afterwards despite the test being negative which makes me think it was not a highly sensitive test.  He began taking the Tamiflu and initially felt better but then afterwards  had worsening of his symptoms with high fevers up above 103 cough sore throat myalgias and inability to sleep along with nausea.  He was not able to keep any fluids down he was taking the Tamiflu faithfully but because of nausea he was not able to keep his many fluids down and did meet his 1 dose of his Tamiflu he did not miss any of his [name].  He came to the emergency department at [facility] and apparently while he was on the stretcher had a syncopal episode.  Upon arrival his clinical exam was worrisome as were some of his labs including his white blood cell count that was 17,800 and his lactic acid that was 3.68 serum pro calcitonin was also up at 29.  Blood cultures were done a chest x-ray was done which showed bilateral infiltrates at the bases.  I was called from med center [facility] who had given him vancomycin and cefepime.  They were concerned about whether or not he might be at risk for PCP.  However upon reviewing the case with them it was clear to me that this patient was highly adherent to his medications and in fact he brought all of his medications to the emergency department with him so that they would know what medications he was taking.  As mentioned his most recent viral load was undetectable and his CD4 count was quite healthy at 570.  I have recommended narrowing to vancomycin  and ceftriaxone and that he be transferred to [facility] or was a long hospital.  This was subsequently done.  I was going to be spent a message with regards to his name but I did not receive it.  The hospitalist today however did alert me to this patient's presence and I came to see him later this afternoon.  After I saw the patient I went and reexamined his blood cultures and saw that they had popped positive on the B CID.  I called microbiology and they confirmed that the patient is growing a methicillin-resistant staphylococcal species in both blood cultures not 1 out of 2 as it appears in epic  initially.  His respiratory virus panel here is also positive for [place] pneumo virus.  This could be a situation where the patient succumbed to [facility] pneumo virus infection in any developed a superinfection with a methicillin-resistant coagulase-negative staph.  I do think we need to narrow to vancomycin and repeat blood cultures I will order a 2D echocardiogram and I would consider a transesophageal echocardiogram certainly if he has a Staphylococcus looked and insists there is no question that he would need a TEE.  Ensure that he is continuing on his [name].  Will need to be careful with his renal functions as he Artie has chronic kidney disease and giving him vancomycin will have some risk.  NOTE HIS MOTHER AND FAMILY AT THE BEDSIDE DO NOT KNOW ABOUT HIV DIAGNOSIS SO PLEASE DO NOT BRING UP THIS DIAGNOSIS AND MAKE SURE THEY LEAVE ROOM TO DISCUSS.  Review of Systems: Review of Systems  Constitutional: Positive for chills, fever and malaise/fatigue. Negative for diaphoresis and weight loss.  HENT: Negative for congestion, hearing loss, sore throat and tinnitus.   Eyes: Positive for blurred vision. Negative for double vision.  Respiratory: Positive for cough. Negative for sputum production, shortness of breath and wheezing.   Cardiovascular: Negative for chest pain, palpitations and leg swelling.  Gastrointestinal: Positive for constipation, diarrhea, nausea and vomiting. Negative for abdominal pain, blood in stool, heartburn and melena.  Genitourinary: Negative for dysuria, flank pain and hematuria.  Musculoskeletal: Positive for back pain and myalgias. Negative for falls and joint pain.  Skin: Negative for itching and rash.  Neurological: Positive for dizziness, loss of consciousness and weakness. Negative for sensory change, focal weakness and headaches.  Endo/Heme/Allergies: Does not bruise/bleed easily.  Psychiatric/Behavioral: Negative for depression, memory loss and suicidal ideas. The  patient is not nervous/anxious.     Past Medical History:  Diagnosis Date  . Eczema   . Environmental allergies   . Essential hypertension   . HIV test positive ([place])   . 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 Shingles     Social History   Tobacco Use  . Smoking status: Never Smoker  . Smokeless tobacco: Never Used  Substance Use Topics  . Alcohol use: No  . Drug use: No    Family History  Problem Relation Age of Onset  . Hypercalcemia Mother   . Hypertension Mother   . Hypertension Father    Allergies  Allergen Reactions  . Lisinopril Other (See Comments)    Dry cough    . Nickel Rash    OBJECTIVE: Blood pressure 112/75, pulse (!) 114, temperature 99.9 F (37.7 C), temperature source Oral, resp. rate (!) 21, height 6\' 4"  (1.93 m), weight 281 lb 15.5 oz (127.9 kg), SpO2 99 %.  Physical Exam  Constitutional: He is oriented to person, place, and  time and well-developed, well-nourished, and in no distress. No distress.  HENT:  Head: Normocephalic and atraumatic.  Right Ear: External ear normal.  Left Ear: External ear normal.  Nose: Nose normal.  Mouth/Throat: Oropharynx is clear and moist. No oropharyngeal exudate.  Eyes: Conjunctivae and EOM are normal. No scleral icterus.  Neck: Normal range of motion. Neck supple.  Cardiovascular: Normal rate, regular rhythm and normal heart sounds. Exam reveals no gallop and no friction rub.  No murmur heard. Pulmonary/Chest: Effort normal and breath sounds normal. No respiratory distress. He has no wheezes. He has no rales.  Abdominal: Soft. Bowel sounds are normal. He exhibits no distension. There is no tenderness. There is no rebound.  Musculoskeletal: Normal range of motion. He exhibits no edema or tenderness.  Lymphadenopathy:    He has no cervical adenopathy.  Neurological: He is alert and oriented to person, place, and time. Gait normal. Coordination normal.  Skin: Skin is warm and dry. No rash  noted. He is not diaphoretic. No erythema. No pallor.  Psychiatric: Mood, memory, affect and judgment normal.    Lab Results Lab Results  Component Value Date   WBC 17.1 (H) 09/08/2017   HGB 13.6 09/08/2017   HCT 38.9 (L) 09/08/2017   MCV 88.6 09/08/2017   PLT 143 (L) 09/08/2017    Lab Results  Component Value Date   CREATININE 2.37 (H) 09/08/2017   BUN 28 (H) 09/08/2017   NA 134 (L) 09/08/2017   K 4.2 09/08/2017   CL 108 09/08/2017   CO2 17 (L) 09/08/2017    Lab Results  Component Value Date   ALT 16 (L) 09/08/2017   AST 26 09/08/2017   ALKPHOS 64 09/08/2017   BILITOT 1.5 (H) 09/08/2017     Microbiology: Recent Results (from the past 240 hour(s))  Blood Culture (routine x 2)     Status: None (Preliminary result)   Collection Time: 09/07/17  3:40 PM  Result Value Ref Range Status   Specimen Description   Final    BLOOD LEFT ARM Performed at [facility], [address] [place], [place] [address]    Special Requests   Final    BOTTLES DRAWN AEROBIC AND ANAEROBIC Blood Culture adequate volume Performed at [facility], [address] [place], [place] [address]    Culture  Setup Time   Final    GRAM POSITIVE COCCI IN BOTH AEROBIC AND ANAEROBIC BOTTLES CRITICAL RESULT CALLED TO, READ BACK BY AND VERIFIED WITH: Ailene Rud AT 0938 09/08/17 BY L BENFIELD Performed at [facility], [place] [address] [place], [place] [address]    Culture GRAM POSITIVE COCCI  Final   Report Status PENDING  Incomplete  Blood Culture (routine x 2)     Status: None (Preliminary result)   Collection Time: 09/07/17  3:40 PM  Result Value Ref Range Status   Specimen Description   Final    BLOOD RIGHT HAND Performed at [facility], [address] [place], [place] [address]    Special Requests   Final    BOTTLES DRAWN AEROBIC AND ANAEROBIC Blood Culture adequate volume Performed at [facility], [address] [place], [place]  [address]    Culture   Final    NO GROWTH < 24 HOURS Performed at [facility], [place] [address], [place], [place] [address]    Report Status PENDING  Incomplete  Blood Culture ID Panel (Reflexed)  Status: Abnormal   Collection Time: 09/07/17  3:40 PM  Result Value Ref Range Status   Enterococcus species NOT DETECTED NOT DETECTED Final   Listeria monocytogenes NOT DETECTED NOT DETECTED Final   Staphylococcus species DETECTED (A) NOT DETECTED Final    Comment: Methicillin (oxacillin) resistant coagulase negative staphylococcus. Possible blood culture contaminant (unless isolated from more than one blood culture draw or clinical case suggests pathogenicity). No antibiotic treatment is indicated for blood  culture contaminants. CRITICAL RESULT CALLED TO, READ BACK BY AND VERIFIED WITH: M MACCIA,PHARMD AT 1618 09/08/17 BY L BENFIELD    Staphylococcus aureus NOT DETECTED NOT DETECTED Final   Methicillin resistance DETECTED (A) NOT DETECTED Final    Comment: CRITICAL RESULT CALLED TO, READ BACK BY AND VERIFIED WITH: M MACCIA,PHARMD AT 1618 09/08/17 BY L BENFIELD    Streptococcus species NOT DETECTED NOT DETECTED Final   Streptococcus agalactiae NOT DETECTED NOT DETECTED Final   Streptococcus pneumoniae NOT DETECTED NOT DETECTED Final   Streptococcus pyogenes NOT DETECTED NOT DETECTED Final   Acinetobacter baumannii NOT DETECTED NOT DETECTED Final   Enterobacteriaceae species NOT DETECTED NOT DETECTED Final   Enterobacter cloacae complex NOT DETECTED NOT DETECTED Final   Escherichia coli NOT DETECTED NOT DETECTED Final   Klebsiella oxytoca NOT DETECTED NOT DETECTED Final   Klebsiella pneumoniae NOT DETECTED NOT DETECTED Final   Proteus species NOT DETECTED NOT DETECTED Final   Serratia marcescens NOT DETECTED NOT DETECTED Final   Haemophilus influenzae NOT DETECTED NOT DETECTED Final   Neisseria meningitidis NOT DETECTED NOT DETECTED Final   Pseudomonas aeruginosa NOT DETECTED NOT  DETECTED Final   Candida albicans NOT DETECTED NOT DETECTED Final   Candida glabrata NOT DETECTED NOT DETECTED Final   Candida krusei NOT DETECTED NOT DETECTED Final   Candida parapsilosis NOT DETECTED NOT DETECTED Final   Candida tropicalis NOT DETECTED NOT DETECTED Final    Comment: Performed at [facility], [address] [place], [place] [address]  Culture, expectorated sputum-assessment     Status: None   Collection Time: 09/07/17  4:20 PM  Result Value Ref Range Status   Specimen Description SPUTUM  Final   Special Requests   Final    Immunocompromised Performed at [facility], [address] [place], [place] [address]    Sputum evaluation THIS SPECIMEN IS ACCEPTABLE FOR SPUTUM CULTURE  Final   Report Status 09/08/2017 FINAL  Final  Culture, respiratory (NON-Expectorated)     Status: None (Preliminary result)   Collection Time: 09/07/17  4:20 PM  Result Value Ref Range Status   Specimen Description SPUTUM  Final   Special Requests Immunocompromised Reflexed from [id number]  Final   Gram Stain   Final    ABUNDANT WBC PRESENT, PREDOMINANTLY PMN ABUNDANT GRAM POSITIVE COCCI MODERATE GRAM NEGATIVE RODS    Culture PENDING  Incomplete   Report Status PENDING  Incomplete  Respiratory Panel by PCR     Status: Abnormal   Collection Time: 09/07/17  6:00 PM  Result Value Ref Range Status   Adenovirus NOT DETECTED NOT DETECTED Final   Coronavirus 229E NOT DETECTED NOT DETECTED Final   Coronavirus HKU1 NOT DETECTED NOT DETECTED Final   Coronavirus NL63 NOT DETECTED NOT DETECTED Final   Coronavirus OC43 NOT DETECTED NOT DETECTED Final   Metapneumovirus DETECTED (A) NOT DETECTED Final   Rhinovirus / Enterovirus NOT DETECTED NOT DETECTED Final   Influenza A NOT DETECTED NOT DETECTED Final   Influenza B NOT DETECTED  NOT DETECTED Final   Parainfluenza Virus 1 NOT DETECTED NOT DETECTED Final   Parainfluenza Virus 2 NOT DETECTED NOT DETECTED Final   Parainfluenza Virus 3  NOT DETECTED NOT DETECTED Final   Parainfluenza Virus 4 NOT DETECTED NOT DETECTED Final   Respiratory Syncytial Virus NOT DETECTED NOT DETECTED Final   Bordetella pertussis NOT DETECTED NOT DETECTED Final   Chlamydophila pneumoniae NOT DETECTED NOT DETECTED Final   Mycoplasma pneumoniae NOT DETECTED NOT DETECTED Final    Comment: Performed at [facility], [place] [address], [place], [place] [address]  MRSA PCR Screening     Status: None   Collection Time: 09/07/17  9:04 PM  Result Value Ref Range Status   MRSA by PCR NEGATIVE NEGATIVE Final    Comment:        The GeneXpert MRSA Assay (FDA approved for NASAL specimens only), is one component of a comprehensive MRSA colonization surveillance program. It is not intended to diagnose MRSA infection nor to guide or monitor treatment for MRSA infections. Performed at [facility], [place] [address], [place], [place] [address]     Alcide Evener, [place] [facility] [place] [facility] [phone] pager  09/08/2017, 5:40 PM

## 2017-09-09 DIAGNOSIS — J181 Lobar pneumonia, unspecified organism: Secondary | ICD-10-CM

## 2017-09-09 DIAGNOSIS — J189 Pneumonia, unspecified organism: Secondary | ICD-10-CM

## 2017-09-09 LAB — PHOSPHORUS: Phosphorus: 2.6 mg/dL (ref 2.5–4.6)

## 2017-09-09 LAB — CBC WITH DIFFERENTIAL/PLATELET
Basophils Absolute: 0 10*3/uL (ref 0.0–0.1)
Basophils Relative: 0 %
Eosinophils Absolute: 0.2 10*3/uL (ref 0.0–0.7)
Eosinophils Relative: 1 %
HCT: 35.5 % — ABNORMAL LOW (ref 39.0–52.0)
Hemoglobin: 12.4 g/dL — ABNORMAL LOW (ref 13.0–17.0)
Lymphocytes Relative: 14 %
Lymphs Abs: 1.8 10*3/uL (ref 0.7–4.0)
MCH: 31 pg (ref 26.0–34.0)
MCHC: 34.9 g/dL (ref 30.0–36.0)
MCV: 88.8 fL (ref 78.0–100.0)
Monocytes Absolute: 0.4 10*3/uL (ref 0.1–1.0)
Monocytes Relative: 3 %
Neutro Abs: 11.3 10*3/uL — ABNORMAL HIGH (ref 1.7–7.7)
Neutrophils Relative %: 82 %
Platelets: 145 10*3/uL — ABNORMAL LOW (ref 150–400)
RBC: 4 MIL/uL — ABNORMAL LOW (ref 4.22–5.81)
RDW: 13.3 % (ref 11.5–15.5)
WBC: 13.7 10*3/uL — ABNORMAL HIGH (ref 4.0–10.5)

## 2017-09-09 LAB — COMPREHENSIVE METABOLIC PANEL
ALT: 12 U/L — ABNORMAL LOW (ref 17–63)
AST: 15 U/L (ref 15–41)
Albumin: 2.4 g/dL — ABNORMAL LOW (ref 3.5–5.0)
Alkaline Phosphatase: 63 U/L (ref 38–126)
Anion gap: 10 (ref 5–15)
BUN: 18 mg/dL (ref 6–20)
CO2: 18 mmol/L — ABNORMAL LOW (ref 22–32)
Calcium: 7.8 mg/dL — ABNORMAL LOW (ref 8.9–10.3)
Chloride: 110 mmol/L (ref 101–111)
Creatinine, Ser: 1.61 mg/dL — ABNORMAL HIGH (ref 0.61–1.24)
GFR calc Af Amer: >60 mL/min (ref >60)
GFR calc non Af Amer: 54 mL/min — ABNORMAL LOW (ref >60)
Glucose, Bld: 97 mg/dL (ref 65–99)
Potassium: 3.8 mmol/L (ref 3.5–5.1)
Sodium: 138 mmol/L (ref 135–145)
Total Bilirubin: 0.8 mg/dL (ref 0.3–1.2)
Total Protein: 6.9 g/dL (ref 6.5–8.1)

## 2017-09-09 LAB — MAGNESIUM: Magnesium: 1.7 mg/dL (ref 1.7–2.4)

## 2017-09-09 MED ORDER — VANCOMYCIN HCL IN DEXTROSE 1-5 GM/200ML-% IV SOLN
1000.0000 mg | Freq: Two times a day (BID) | INTRAVENOUS | Status: DC
  Administered 2017-09-09 – 2017-09-11 (×4): 1000 mg via INTRAVENOUS
  Filled 2017-09-09 (×5): qty 200

## 2017-09-09 MED ORDER — BENZONATATE 100 MG PO CAPS
100.0000 mg | ORAL_CAPSULE | Freq: Three times a day (TID) | ORAL | Status: DC | PRN
  Administered 2017-09-09 – 2017-09-11 (×3): 100 mg via ORAL
  Filled 2017-09-09 (×3): qty 1

## 2017-09-09 NOTE — Progress Notes (Signed)
      INFECTIOUS DISEASE ATTENDING ADDENDUM:   Date: 09/09/2017  Patient name: Lee [name]  Medical record number: [id number]  Date of birth: [birth year]   Spoke with microbiology again.  This MR Coagulate NEG staph was ID ONLY in 1/2 blood cultures and ONLY in the one that has growth.   IF this is a Staph lugdunensis would treat as REAL and pursue TEE I  If read is "Coag negative staph" then it = CONTAMINANT and can focus on treating him for bacterial PNA superimposed on Metapneumovirus.   Alcide Evener 09/09/2017, 3:52 PM

## 2017-09-09 NOTE — Progress Notes (Signed)
PROGRESS NOTE    Lee [name]  URK:[id number] DOB: October 06, 1980 DOA: 09/07/2017 PCP: [name], NP   Brief Narrative:  Lee [name] is a 37 y.o. male with medical history significant of HIV, HTN, syphilis treated and other comorbids who presented with a cough for the past 5 days. Reports having fevers up to 103.3 and chills with diaphoresis. Cough is productive of yellow and thick sputum. Had bleeding from his nose and possibly blood tinged sputum. He presented to urgent care was started on Tamiflu but later on influenza screen came back negative. He had decreased oral intake reports feeling dehydrated and exhausted. Reports taking all of his HIV medications. Last CD4 >400 in January. Was found to be Septic and CXR revealed Bilateral PNA. Sepsis protocol initiated and Started on IV Abx. Case was Discussed with ID who did not feel as if it was PCP Pneumonia. Patient admitted to SDU and is currently being treated. Patient found to be septic from likely Methicillin Resistant Staph Species Bacteremia with concomitant Metapneumovirus Infection.    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 Cannot exclude component of left pleural fluid. Cardiomediastinal silhouette and remainder of the exam is unchanged. -IV Abx changed by Infectious Diseases to IV Vancomycin  -CXR this AM  -S/p 3 Liters of IVF boluses; C/w NS at 125 mL/hr -Added Breathing Treatments with Xopenex/Atrovent -C/w Guaifenesin -Blood Cx's Positive for Methicillin Resistance Coag Negative Staphylococcus Species in 1/2 Cx's  however 2nd Cx showed Gram Positive Cocci; Now second Cx showing NGTD at 2 days; Called Microbiology and they don't know what happened and are investigating further  -Repeat Blood Cx 09/08/17 showed NGTD <24 hours  -Check Strep Pneumo Urine Ag -Discussed Case with ID who feels that is not PCP Pneumonia and recommended continuing current Abx Regimen with just IV Vancomycin  -Procalcitonin was 29.96 -Lactic Acid Level decreasing from 3.68 -> 3.65 -> 2.9 -> 2.2 -WBC went from 17.8 -> 17.1 -> 13.7 -May consider IV Steroids if not improving more  -Checked Gram Stain showed:   ABUNDANT WBC PRESENT, PREDOMINANTLY PMN  ABUNDANT GRAM POSITIVE COCCI  MODERATE GRAM NEGATIVE RODS      Sputum Cx pending  -Repeat CXR in AM and if worsening will get CT Chest w/o Contrast -ID ordering Transthoracic ECHOCardiogram  -Repeat CBC in AM  Lactic Acidosis -From Sepsis and Dehydration from poor oral intake  -Improving. Lactic Acid Level decreasing from 3.68 -> 3.65 -> 2.9 -> 2.2 -C/w IVF Rehydration at 125 mL/hr for now   AKI on CKD Stage 3 -Baseline Cr raging from 1.1-1.5 -BUN/Cr on Admission was 33/3.41 and now improved to 18/1.61 -C/w IVF Rehydration with NS at 125 mL/hr -Avoid Nephrotoxic Medications as possible -Repeat CMP in AM   HIV -Patient states he is compliant with Medications -Last CD4 Count 07/26/17 was 570; Viral Load was <20 -Repeat this Hospitalization -C/w Bictegravir-Emtricitabine-Tenofovir 50-200-25 mg 1 tab po Daily  -Discussed with ID and Dr. Tommy Medal and appreciate his Evaluation and Recommendations  Hyperbilirubinemia -Patient's T Bili went from 2.4 -> 1.5 -> 0.8 -Continue to Monitor and Repeat CMP in AM  Hyponatremia, improving -Patient's  Na+ was 131 and improved to 138 -Continue IVF Rehydration and Repeat CMP in AM  Hyperglycemia -Maybe Reactive in the Setting of Sepsis and Infection -Check HbA1c in AM -Continue to Monitor BS and if >200 start Sensitive Novolog  SSI  Thrombocytopenia -Patient's Platelet Count went from 161 -> 143 -> 145 -Likely in the Setting of Infection -Continue to Monitor for S/Sx of Bleeding -Repeat CBC in AM  Normocytic Anemia -Patient's Hb/Hct went from 15.4/43.2 -> 13.6/38.9 -> 12.4/35.5 -Likely Dilutional Drop from IVF Rehydration   DVT prophylaxis: Heparin 5,000 units sq q8h Code Status: FULL CODE Family Communication: No family present at bedside  Disposition Plan: Remain in SDU for now as still too unstable for Telemetry and possible transfer to Telemetry in AM if improving   Consultants:   Infectious Diseases Dr. Dietrich Pates [facility] was consulted by EDP who felt patient did not meet ICU Criteria.    Procedures: None   Antimicrobials:  Anti-infectives (From admission, onward)   Start     Dose/Rate Route Frequency Ordered Stop   09/09/17 1600  vancomycin (VANCOCIN) IVPB 1000 mg/200 mL premix     1,000 mg 200 mL/hr over 60 Minutes Intravenous Every 12 hours 09/09/17 0735     09/08/17 2200  vancomycin (VANCOCIN) 1,500 mg in sodium chloride 0.9 % 500 mL IVPB  Status:  Discontinued     1,500 mg 250 mL/hr over 120 Minutes Intravenous Every 24 hours 09/08/17 2058 09/09/17 0735   09/08/17 1800  ceFEPIme (MAXIPIME) 2 g in sodium chloride 0.9 % 100 mL IVPB  Status:  Discontinued     2 g 200 mL/hr over 30 Minutes Intravenous Every 24 hours 09/07/17 1637 09/07/17 1648   09/08/17 1800  cefTRIAXone (ROCEPHIN) 2 g in sodium chloride 0.9 % 100 mL IVPB  Status:  Discontinued     2 g 200 mL/hr over 30 Minutes Intravenous Every 24 hours 09/07/17 1648 09/08/17 1756   09/08/17 1000  bictegravir-emtricitabine-tenofovir AF ([name]) 50-200-25 MG per tablet 1 tablet     1 tablet Oral Daily 09/07/17 2200     09/07/17 2300  azithromycin (ZITHROMAX) 500 mg in sodium chloride 0.9 % 250 mL IVPB  Status:  Discontinued     500 mg 250 mL/hr over 60 Minutes Intravenous Daily at bedtime 09/07/17 2200 09/08/17 1756   09/07/17 2215   cefTRIAXone (ROCEPHIN) 1 g in sodium chloride 0.9 % 100 mL IVPB  Status:  Discontinued     1 g 200 mL/hr over 30 Minutes Intravenous Every 24 hours 09/07/17 2200 09/07/17 2203   09/07/17 1928  vancomycin (VANCOCIN) 500 MG powder    Comments:  Peel, Adrienne   : cabinet override      09/07/17 1928 09/08/17 0729   09/07/17 1900  vancomycin (VANCOCIN) 500 mg in sodium chloride 0.9 % 100 mL IVPB     500 mg 100 mL/hr over 60 Minutes Intravenous  Once 09/07/17 1646 09/07/17 2035   09/07/17 1830  vancomycin (VANCOCIN) 500 mg in sodium chloride 0.9 % 100 mL IVPB  Status:  Discontinued     500 mg 100 mL/hr over 60 Minutes Intravenous  Once 09/07/17 1603 09/07/17 1611   09/07/17 1800  vancomycin (VANCOCIN) IVPB 1000 mg/200 mL premix     1,000 mg 200 mL/hr over 60 Minutes Intravenous  Once 09/07/17 1646 09/07/17 1923   09/07/17 1730  vancomycin (VANCOCIN) IVPB 1000 mg/200 mL premix  Status:  Discontinued     1,000 mg 200 mL/hr over  60 Minutes Intravenous  Once 09/07/17 1603 09/07/17 1611   09/07/17 1700  vancomycin (VANCOCIN) IVPB 1000 mg/200 mL premix     1,000 mg 200 mL/hr over 60 Minutes Intravenous  Once 09/07/17 1646 09/07/17 1758   09/07/17 1630  vancomycin (VANCOCIN) IVPB 1000 mg/200 mL premix  Status:  Discontinued     1,000 mg 200 mL/hr over 60 Minutes Intravenous  Once 09/07/17 1603 09/07/17 1611   09/07/17 1605  ceFEPIme (MAXIPIME) 2 g injection    Comments:  Peel, Adrienne   : cabinet override      09/07/17 1605 09/08/17 0414   09/07/17 1545  vancomycin (VANCOCIN) IVPB 1000 mg/200 mL premix  Status:  Discontinued     1,000 mg 200 mL/hr over 60 Minutes Intravenous  Once 09/07/17 1541 09/07/17 1542   09/07/17 1545  piperacillin-tazobactam (ZOSYN) IVPB 3.375 g  Status:  Discontinued     3.375 g 100 mL/hr over 30 Minutes Intravenous  Once 09/07/17 1541 09/07/17 1542   09/07/17 1545  ceFEPIme (MAXIPIME) 2 g in sodium chloride 0.9 % 100 mL IVPB     2 g 200 mL/hr over 30 Minutes  Intravenous  Once 09/07/17 1542 09/07/17 1646   09/07/17 1545  vancomycin (VANCOCIN) IVPB 1000 mg/200 mL premix  Status:  Discontinued     1,000 mg 200 mL/hr over 60 Minutes Intravenous  Once 09/07/17 1542 09/07/17 1602     Subjective: Seen and examined and slept some and felt better but still felt very lousy. SOB essentially the same. No lightheadedness or dizziness. No CP. Had some mild abdominal tenderness. No Swelling in LE.   Objective: Vitals:   09/09/17 0800 09/09/17 0840 09/09/17 1151 09/09/17 1410  BP: (!) 97/52  (!) 110/51   Pulse: 81  (!) 31   Resp:      Temp: 98.4 F (36.9 C)  98.4 F (36.9 C)   TempSrc: Oral  Oral   SpO2: 99% 99% 100% 100%  Weight:      Height:        Intake/Output Summary (Last 24 hours) at 09/09/2017 1421 Last data filed at 09/09/2017 1153 Gross per 24 hour  Intake 1875 ml  Output 2825 ml  Net -950 ml   Filed Weights   09/07/17 1531 09/07/17 2104  Weight: 126.6 kg (279 lb) 127.9 kg (281 lb 15.5 oz)   Examination: Physical Exam:  Constitutional: WN/WD AAM in NAD appears calm Eyes: Sclerae anicteric. Lids normal ENMT: External Ears and nose appear normal Neck: Supple with no JVD Respiratory: Diminished to auscultation bilaterally with rhonchus breath sounds and mild expiratory wheezing. Wearing Supplemental O2 via [place] Cardiovascular: Tachycardic Rate but regular rhythm. S1, S2. No LE edema appreciated  Abdomen: Soft, Mildly tender, ND. Bowel sounds present GU: Deferred Musculoskeletal: No contractures; No cyanosis Skin: Warm and dry. No appreciable rashes, lesions or ulcers on a limited skin eval Neurologic: CN 2-12 grossly intact. No appreciable focal deficits Psychiatric: Normal mood and affect. Intact judgement and insight. Awake and alert.  Data Reviewed: I have personally reviewed following labs and imaging studies  CBC: Recent Labs  Lab 09/07/17 1544 09/08/17 0057 09/09/17 0520  WBC 17.8* 17.1* 13.7*  NEUTROABS 15.7* 15.1*  11.3*  HGB 15.4 13.6 12.4*  HCT 43.2 38.9* 35.5*  MCV 87.4 88.6 88.8  PLT 161 143* 810*   Basic Metabolic Panel: Recent Labs  Lab 09/07/17 1544 09/08/17 0057 09/09/17 0520  NA 131* 134* 138  K 4.2 4.2 3.8  CL 103 108  110  CO2 17* 17* 18*  GLUCOSE 103* 115* 97  BUN 33* 28* 18  CREATININE 3.41* 2.37* 1.61*  CALCIUM 8.0* 7.5* 7.8*  MG  --   --  1.7  PHOS  --   --  2.6   GFR: Estimated Creatinine Clearance: 94.4 mL/min (A) (by C-G formula based on SCr of 1.61 mg/dL (H)). Liver Function Tests: Recent Labs  Lab 09/07/17 1544 09/08/17 0057 09/09/17 0520  AST 37 26 15  ALT 19 16* 12*  ALKPHOS 71 64 63  BILITOT 2.4* 1.5* 0.8  PROT 8.5* 7.0 6.9  ALBUMIN 3.2* 2.5* 2.4*   No results for input(s): LIPASE, AMYLASE in the last 168 hours. No results for input(s): AMMONIA in the last 168 hours. Coagulation Profile: Recent Labs  Lab 09/07/17 2222  INR 1.55   Cardiac Enzymes: Recent Labs  Lab 09/07/17 1544  TROPONINI <0.03   BNP (last 3 results) No results for input(s): PROBNP in the last 8760 hours. HbA1C: No results for input(s): HGBA1C in the last 72 hours. CBG: Recent Labs  Lab 09/07/17 1542  GLUCAP 92   Lipid Profile: No results for input(s): CHOL, HDL, LDLCALC, TRIG, CHOLHDL, LDLDIRECT in the last 72 hours. Thyroid Function Tests: Recent Labs    09/07/17 2235  TSH 1.773   Anemia Panel: No results for input(s): VITAMINB12, FOLATE, FERRITIN, TIBC, IRON, RETICCTPCT in the last 72 hours. Sepsis Labs: Recent Labs  Lab 09/07/17 1600 09/07/17 1740 09/07/17 2222 09/08/17 0057  PROCALCITON  --   --  29.96  --   LATICACIDVEN 3.68* 3.65* 2.9* 2.2*    Recent Results (from the past 240 hour(s))  Blood Culture (routine x 2)     Status: None (Preliminary result)   Collection Time: 09/07/17  3:40 PM  Result Value Ref Range Status   Specimen Description   Final    BLOOD LEFT ARM Performed at [facility], [address] [place], [place]  [address]    Special Requests   Final    BOTTLES DRAWN AEROBIC AND ANAEROBIC Blood Culture adequate volume Performed at [facility], [address] [place], [place] [address]    Culture  Setup Time   Final    GRAM POSITIVE COCCI IN BOTH AEROBIC AND ANAEROBIC BOTTLES CRITICAL RESULT CALLED TO, READ BACK BY AND VERIFIED WITH: Ailene Rud AT 8295 09/08/17 BY L BENFIELD    Culture   Final    GRAM POSITIVE COCCI CULTURE REINCUBATED FOR BETTER GROWTH Performed at [facility], [place] [address] [place], [place] [address]    Report Status PENDING  Incomplete  Blood Culture (routine x 2)     Status: None (Preliminary result)   Collection Time: 09/07/17  3:40 PM  Result Value Ref Range Status   Specimen Description   Final    BLOOD RIGHT HAND Performed at [facility], [address] [place], [place] [address]    Special Requests   Final    BOTTLES DRAWN AEROBIC AND ANAEROBIC Blood Culture adequate volume Performed at [facility], [address] [place], [place] [address]    Culture   Final    NO GROWTH 2 DAYS Performed at [facility], [place] [address], [place], [place] [address]    Report Status PENDING  Incomplete  Blood Culture ID Panel (Reflexed)     Status: Abnormal   Collection Time: 09/07/17  3:40 PM  Result Value Ref  Range Status   Enterococcus species NOT DETECTED NOT DETECTED Final   Listeria monocytogenes NOT DETECTED NOT DETECTED Final   Staphylococcus species DETECTED (A) NOT DETECTED Final    Comment: Methicillin (oxacillin) resistant coagulase negative staphylococcus. Possible blood culture contaminant (unless isolated from more than one blood culture draw or clinical case suggests pathogenicity). No antibiotic treatment is indicated for blood  culture contaminants. CRITICAL RESULT CALLED TO, READ BACK BY AND VERIFIED WITH: M MACCIA,PHARMD AT 1618 09/08/17 BY L BENFIELD    Staphylococcus aureus NOT DETECTED NOT DETECTED  Final   Methicillin resistance DETECTED (A) NOT DETECTED Final    Comment: CRITICAL RESULT CALLED TO, READ BACK BY AND VERIFIED WITH: M MACCIA,PHARMD AT 1618 09/08/17 BY L BENFIELD    Streptococcus species NOT DETECTED NOT DETECTED Final   Streptococcus agalactiae NOT DETECTED NOT DETECTED Final   Streptococcus pneumoniae NOT DETECTED NOT DETECTED Final   Streptococcus pyogenes NOT DETECTED NOT DETECTED Final   Acinetobacter baumannii NOT DETECTED NOT DETECTED Final   Enterobacteriaceae species NOT DETECTED NOT DETECTED Final   Enterobacter cloacae complex NOT DETECTED NOT DETECTED Final   Escherichia coli NOT DETECTED NOT DETECTED Final   Klebsiella oxytoca NOT DETECTED NOT DETECTED Final   Klebsiella pneumoniae NOT DETECTED NOT DETECTED Final   Proteus species NOT DETECTED NOT DETECTED Final   Serratia marcescens NOT DETECTED NOT DETECTED Final   Haemophilus influenzae NOT DETECTED NOT DETECTED Final   Neisseria meningitidis NOT DETECTED NOT DETECTED Final   Pseudomonas aeruginosa NOT DETECTED NOT DETECTED Final   Candida albicans NOT DETECTED NOT DETECTED Final   Candida glabrata NOT DETECTED NOT DETECTED Final   Candida krusei NOT DETECTED NOT DETECTED Final   Candida parapsilosis NOT DETECTED NOT DETECTED Final   Candida tropicalis NOT DETECTED NOT DETECTED Final    Comment: Performed at [facility], [address], [place], [place] [address]  Culture, expectorated sputum-assessment     Status: None   Collection Time: 09/07/17  4:20 PM  Result Value Ref Range Status   Specimen Description SPUTUM  Final   Special Requests   Final    Immunocompromised Performed at [facility], [address] [place], [place] [address]    Sputum evaluation THIS SPECIMEN IS ACCEPTABLE FOR SPUTUM CULTURE  Final   Report Status 09/08/2017 FINAL  Final  Culture, respiratory (NON-Expectorated)     Status: None (Preliminary result)   Collection Time: 09/07/17  4:20 PM  Result  Value Ref Range Status   Specimen Description SPUTUM  Final   Special Requests Immunocompromised Reflexed from [id number]  Final   Gram Stain   Final    ABUNDANT WBC PRESENT, PREDOMINANTLY PMN ABUNDANT GRAM POSITIVE COCCI MODERATE GRAM NEGATIVE RODS    Culture   Final    CULTURE REINCUBATED FOR BETTER GROWTH Performed at [facility], [place] [address] [place], [place] [address]    Report Status PENDING  Incomplete  Respiratory Panel by PCR     Status: Abnormal   Collection Time: 09/07/17  6:00 PM  Result Value Ref Range Status   Adenovirus NOT DETECTED NOT DETECTED Final   Coronavirus 229E NOT DETECTED NOT DETECTED Final   Coronavirus HKU1 NOT DETECTED NOT DETECTED Final   Coronavirus NL63 NOT DETECTED NOT DETECTED Final   Coronavirus OC43 NOT DETECTED NOT DETECTED Final   Metapneumovirus DETECTED (A) NOT DETECTED Final   Rhinovirus / Enterovirus NOT DETECTED NOT DETECTED Final   Influenza A NOT DETECTED  NOT DETECTED Final   Influenza B NOT DETECTED NOT DETECTED Final   Parainfluenza Virus 1 NOT DETECTED NOT DETECTED Final   Parainfluenza Virus 2 NOT DETECTED NOT DETECTED Final   Parainfluenza Virus 3 NOT DETECTED NOT DETECTED Final   Parainfluenza Virus 4 NOT DETECTED NOT DETECTED Final   Respiratory Syncytial Virus NOT DETECTED NOT DETECTED Final   Bordetella pertussis NOT DETECTED NOT DETECTED Final   Chlamydophila pneumoniae NOT DETECTED NOT DETECTED Final   Mycoplasma pneumoniae NOT DETECTED NOT DETECTED Final    Comment: Performed at [facility], [place] [address] [place], [place] [address]  MRSA PCR Screening     Status: None   Collection Time: 09/07/17  9:04 PM  Result Value Ref Range Status   MRSA by PCR NEGATIVE NEGATIVE Final    Comment:        The GeneXpert MRSA Assay (FDA approved for NASAL specimens only), is one component of a comprehensive MRSA colonization surveillance program. It is not intended to diagnose MRSA infection nor to guide or monitor  treatment for MRSA infections. Performed at [facility], [place] [address], [place], [place] [address]   Culture, blood (Routine X 2) w Reflex to ID Panel     Status: None (Preliminary result)   Collection Time: 09/08/17  6:12 PM  Result Value Ref Range Status   Specimen Description BLOOD RIGHT ANTECUBITAL  Final   Special Requests   Final    BOTTLES DRAWN AEROBIC AND ANAEROBIC Blood Culture adequate volume   Culture   Final    NO GROWTH < 24 HOURS Performed at [facility], [place] [address], [place], [place] [address]    Report Status PENDING  Incomplete  Culture, blood (Routine X 2) w Reflex to ID Panel     Status: None (Preliminary result)   Collection Time: 09/08/17  6:14 PM  Result Value Ref Range Status   Specimen Description BLOOD LEFT HAND  Final   Special Requests   Final    BOTTLES DRAWN AEROBIC ONLY Blood Culture adequate volume   Culture   Final    NO GROWTH < 24 HOURS Performed at [facility], [place] [address], [place], [place] [address]    Report Status PENDING  Incomplete    Radiology Studies: Dg Chest Port 1 View  Result Date: 09/08/2017 CLINICAL DATA:  Shortness of breath. EXAM: PORTABLE CHEST 1 VIEW COMPARISON:  09/07/2017 FINDINGS: Lungs are adequately inflated with continued bibasilar airspace opacification likely infection. Cannot exclude component of left pleural fluid. Cardiomediastinal silhouette and remainder of the exam is unchanged. IMPRESSION: Stable bibasilar airspace process likely pneumonia. Possible small amount of left pleural fluid. Electronically Signed   By: Marin Olp M.D.   On: 09/08/2017 10:48   Dg Chest Port 1 View  Result Date: 09/07/2017 CLINICAL DATA:  Fever for 4-5 days.  Weakness.  HIV. EXAM: PORTABLE CHEST 1 VIEW COMPARISON:  02/27/2008. FINDINGS: Normal cardiomediastinal silhouette. BILATERAL pulmonary opacities, LEFT greater than RIGHT consistent with acute pneumonia. There may be a small LEFT effusion. No pneumothorax.  Bones unremarkable. Worsening aeration from priors. IMPRESSION: BILATERAL pulmonary opacities, LEFT greater than RIGHT consistent with acute pneumonia. Given that the patient is HIV positive, it is possible that the observed infiltrates could be either a typical or opportunistic infection. Electronically Signed   By: Staci Righter M.D.   On: 09/07/2017 15:58   Scheduled Meds: . bictegravir-emtricitabine-tenofovir AF  1 tablet Oral Daily  . guaiFENesin  1,200 mg Oral BID  . heparin injection (subcutaneous)  5,000 Units Subcutaneous Q8H  . ipratropium  0.5 mg Nebulization TID  . levalbuterol  0.63 mg Nebulization TID  . loratadine  10 mg Oral Daily  . mouth rinse  15 mL Mouth Rinse BID   Continuous Infusions: . sodium chloride 125 mL/hr at 09/09/17 1048  . vancomycin      LOS: 2 days   Kerney Elbe, DO [facility] Pager [phone]  If 7PM-7AM, please contact night-coverage [email or web address] Password [email or web address] 09/09/2017, 2:21 PM

## 2017-09-09 NOTE — Progress Notes (Addendum)
Subjective: No new complaints   Antibiotics:  Anti-infectives (From admission, onward)   Start     Dose/Rate Route Frequency Ordered Stop   09/09/17 1600  vancomycin (VANCOCIN) IVPB 1000 mg/200 mL premix     1,000 mg 200 mL/hr over 60 Minutes Intravenous Every 12 hours 09/09/17 0735     09/08/17 2200  vancomycin (VANCOCIN) 1,500 mg in sodium chloride 0.9 % 500 mL IVPB  Status:  Discontinued     1,500 mg 250 mL/hr over 120 Minutes Intravenous Every 24 hours 09/08/17 2058 09/09/17 0735   09/08/17 1800  ceFEPIme (MAXIPIME) 2 g in sodium chloride 0.9 % 100 mL IVPB  Status:  Discontinued     2 g 200 mL/hr over 30 Minutes Intravenous Every 24 hours 09/07/17 1637 09/07/17 1648   09/08/17 1800  cefTRIAXone (ROCEPHIN) 2 g in sodium chloride 0.9 % 100 mL IVPB  Status:  Discontinued     2 g 200 mL/hr over 30 Minutes Intravenous Every 24 hours 09/07/17 1648 09/08/17 1756   09/08/17 1000  bictegravir-emtricitabine-tenofovir AF ([name]) 50-200-25 MG per tablet 1 tablet     1 tablet Oral Daily 09/07/17 2200     09/07/17 2300  azithromycin (ZITHROMAX) 500 mg in sodium chloride 0.9 % 250 mL IVPB  Status:  Discontinued     500 mg 250 mL/hr over 60 Minutes Intravenous Daily at bedtime 09/07/17 2200 09/08/17 1756   09/07/17 2215  cefTRIAXone (ROCEPHIN) 1 g in sodium chloride 0.9 % 100 mL IVPB  Status:  Discontinued     1 g 200 mL/hr over 30 Minutes Intravenous Every 24 hours 09/07/17 2200 09/07/17 2203   09/07/17 1928  vancomycin (VANCOCIN) 500 MG powder    Comments:  Peel, Adrienne   : cabinet override      09/07/17 1928 09/08/17 0729   09/07/17 1900  vancomycin (VANCOCIN) 500 mg in sodium chloride 0.9 % 100 mL IVPB     500 mg 100 mL/hr over 60 Minutes Intravenous  Once 09/07/17 1646 09/07/17 2035   09/07/17 1830  vancomycin (VANCOCIN) 500 mg in sodium chloride 0.9 % 100 mL IVPB  Status:  Discontinued     500 mg 100 mL/hr over 60 Minutes Intravenous  Once 09/07/17 1603 09/07/17 1611     09/07/17 1800  vancomycin (VANCOCIN) IVPB 1000 mg/200 mL premix     1,000 mg 200 mL/hr over 60 Minutes Intravenous  Once 09/07/17 1646 09/07/17 1923   09/07/17 1730  vancomycin (VANCOCIN) IVPB 1000 mg/200 mL premix  Status:  Discontinued     1,000 mg 200 mL/hr over 60 Minutes Intravenous  Once 09/07/17 1603 09/07/17 1611   09/07/17 1700  vancomycin (VANCOCIN) IVPB 1000 mg/200 mL premix     1,000 mg 200 mL/hr over 60 Minutes Intravenous  Once 09/07/17 1646 09/07/17 1758   09/07/17 1630  vancomycin (VANCOCIN) IVPB 1000 mg/200 mL premix  Status:  Discontinued     1,000 mg 200 mL/hr over 60 Minutes Intravenous  Once 09/07/17 1603 09/07/17 1611   09/07/17 1605  ceFEPIme (MAXIPIME) 2 g injection    Comments:  Peel, Adrienne   : cabinet override      09/07/17 1605 09/08/17 0414   09/07/17 1545  vancomycin (VANCOCIN) IVPB 1000 mg/200 mL premix  Status:  Discontinued     1,000 mg 200 mL/hr over 60 Minutes Intravenous  Once 09/07/17 1541 09/07/17 1542   09/07/17 1545  piperacillin-tazobactam (ZOSYN) IVPB 3.375 g  Status:  Discontinued     3.375 g 100 mL/hr over 30 Minutes Intravenous  Once 09/07/17 1541 09/07/17 1542   09/07/17 1545  ceFEPIme (MAXIPIME) 2 g in sodium chloride 0.9 % 100 mL IVPB     2 g 200 mL/hr over 30 Minutes Intravenous  Once 09/07/17 1542 09/07/17 1646   09/07/17 1545  vancomycin (VANCOCIN) IVPB 1000 mg/200 mL premix  Status:  Discontinued     1,000 mg 200 mL/hr over 60 Minutes Intravenous  Once 09/07/17 1542 09/07/17 1602      Medications: Scheduled Meds: . bictegravir-emtricitabine-tenofovir AF  1 tablet Oral Daily  . guaiFENesin  1,200 mg Oral BID  . heparin injection (subcutaneous)  5,000 Units Subcutaneous Q8H  . ipratropium  0.5 mg Nebulization TID  . levalbuterol  0.63 mg Nebulization TID  . loratadine  10 mg Oral Daily  . mouth rinse  15 mL Mouth Rinse BID   Continuous Infusions: . sodium chloride 125 mL/hr at 09/09/17 1048  . vancomycin     PRN  Meds:.acetaminophen, fentaNYL (SUBLIMAZE) injection, oxyCODONE-acetaminophen    Objective: Weight change:   Intake/Output Summary (Last 24 hours) at 09/09/2017 1323 Last data filed at 09/09/2017 1153 Gross per 24 hour  Intake 2214.58 ml  Output 3125 ml  Net -910.42 ml   Blood pressure (!) 110/51, pulse (!) 31, temperature 98.4 F (36.9 C), temperature source Oral, resp. rate 20, height 6\' 4"  (1.93 m), weight 281 lb 15.5 oz (127.9 kg), SpO2 100 %. Temp:  [97.8 F (36.6 C)-99.9 F (37.7 C)] 98.4 F (36.9 C) (02/17 1151) Pulse Rate:  [31-114] 31 (02/17 1151) Resp:  [13-29] 20 (02/17 0400) BP: (97-120)/(51-83) 110/51 (02/17 1151) SpO2:  [98 %-100 %] 100 % (02/17 1151)  Physical Exam: General: Alert and awake, oriented x3, not in any acute distress. HEENT: anicteric sclera,  EOMI CVS regular rate, normal r,  no murmur rubs or gallops Chest: Fairly rhonchorous anteriorly Abdomen: soft nontender, nondistended, normal bowel sounds, Extremities: no  clubbing or edema noted bilaterally Skin: no rashes Neuro: nonfocal  CBC: CBC Latest Ref Rng & Units 09/09/2017 09/08/2017 09/07/2017  WBC 4.0 - 10.5 K/uL 13.7(H) 17.1(H) 17.8(H)  Hemoglobin 13.0 - 17.0 g/dL 12.4(L) 13.6 15.4  Hematocrit 39.0 - 52.0 % 35.5(L) 38.9(L) 43.2  Platelets 150 - 400 K/uL 145(L) 143(L) 161      BMET Recent Labs    09/08/17 0057 09/09/17 0520  NA 134* 138  K 4.2 3.8  CL 108 110  CO2 17* 18*  GLUCOSE 115* 97  BUN 28* 18  CREATININE 2.37* 1.61*  CALCIUM 7.5* 7.8*     Liver Panel  Recent Labs    09/08/17 0057 09/09/17 0520  PROT 7.0 6.9  ALBUMIN 2.5* 2.4*  AST 26 15  ALT 16* 12*  ALKPHOS 64 63  BILITOT 1.5* 0.8       Sedimentation Rate No results for input(s): ESRSEDRATE in the last 72 hours. C-Reactive Protein No results for input(s): CRP in the last 72 hours.  Micro Results: Recent Results (from the past 720 hour(s))  Blood Culture (routine x 2)     Status: None (Preliminary  result)   Collection Time: 09/07/17  3:40 PM  Result Value Ref Range Status   Specimen Description   Final    BLOOD LEFT ARM Performed at [facility], [address] [place], [place] [address]    Special Requests   Final    BOTTLES DRAWN AEROBIC AND ANAEROBIC Blood Culture adequate  volume Performed at [facility], [address] [place], [place] [address]    Culture  Setup Time   Final    GRAM POSITIVE COCCI IN BOTH AEROBIC AND ANAEROBIC BOTTLES CRITICAL RESULT CALLED TO, READ BACK BY AND VERIFIED WITH: Ailene Rud AT 0932 09/08/17 BY L BENFIELD    Culture   Final    GRAM POSITIVE COCCI CULTURE REINCUBATED FOR BETTER GROWTH Performed at [facility], [place] [address] [place], [place] [address]    Report Status PENDING  Incomplete  Blood Culture (routine x 2)     Status: None (Preliminary result)   Collection Time: 09/07/17  3:40 PM  Result Value Ref Range Status   Specimen Description   Final    BLOOD RIGHT HAND Performed at [facility], [address] [place], [place] [address]    Special Requests   Final    BOTTLES DRAWN AEROBIC AND ANAEROBIC Blood Culture adequate volume Performed at [facility], [address] [place], [place] [address]    Culture   Final    NO GROWTH 2 DAYS Performed at [facility], [place] [address], [place], [place] [address]    Report Status PENDING  Incomplete  Blood Culture ID Panel (Reflexed)     Status: Abnormal   Collection Time: 09/07/17  3:40 PM  Result Value Ref Range Status   Enterococcus species NOT DETECTED NOT DETECTED Final   Listeria monocytogenes NOT DETECTED NOT DETECTED Final   Staphylococcus species DETECTED (A) NOT DETECTED Final    Comment: Methicillin (oxacillin) resistant coagulase negative staphylococcus. Possible blood culture contaminant (unless isolated from more than one blood culture draw or clinical case suggests pathogenicity). No antibiotic treatment is  indicated for blood  culture contaminants. CRITICAL RESULT CALLED TO, READ BACK BY AND VERIFIED WITH: M MACCIA,PHARMD AT 1618 09/08/17 BY L BENFIELD    Staphylococcus aureus NOT DETECTED NOT DETECTED Final   Methicillin resistance DETECTED (A) NOT DETECTED Final    Comment: CRITICAL RESULT CALLED TO, READ BACK BY AND VERIFIED WITH: M MACCIA,PHARMD AT 1618 09/08/17 BY L BENFIELD    Streptococcus species NOT DETECTED NOT DETECTED Final   Streptococcus agalactiae NOT DETECTED NOT DETECTED Final   Streptococcus pneumoniae NOT DETECTED NOT DETECTED Final   Streptococcus pyogenes NOT DETECTED NOT DETECTED Final   Acinetobacter baumannii NOT DETECTED NOT DETECTED Final   Enterobacteriaceae species NOT DETECTED NOT DETECTED Final   Enterobacter cloacae complex NOT DETECTED NOT DETECTED Final   Escherichia coli NOT DETECTED NOT DETECTED Final   Klebsiella oxytoca NOT DETECTED NOT DETECTED Final   Klebsiella pneumoniae NOT DETECTED NOT DETECTED Final   Proteus species NOT DETECTED NOT DETECTED Final   Serratia marcescens NOT DETECTED NOT DETECTED Final   Haemophilus influenzae NOT DETECTED NOT DETECTED Final   Neisseria meningitidis NOT DETECTED NOT DETECTED Final   Pseudomonas aeruginosa NOT DETECTED NOT DETECTED Final   Candida albicans NOT DETECTED NOT DETECTED Final   Candida glabrata NOT DETECTED NOT DETECTED Final   Candida krusei NOT DETECTED NOT DETECTED Final   Candida parapsilosis NOT DETECTED NOT DETECTED Final   Candida tropicalis NOT DETECTED NOT DETECTED Final    Comment: Performed at [facility], [address], [place], [place] [address]  Culture, expectorated sputum-assessment     Status: None   Collection Time: 09/07/17  4:20 PM  Result Value Ref Range Status   Specimen Description SPUTUM  Final  Special Requests   Final    Immunocompromised Performed at [facility], [address] [place], [place] [address]    Sputum evaluation THIS SPECIMEN IS  ACCEPTABLE FOR SPUTUM CULTURE  Final   Report Status 09/08/2017 FINAL  Final  Culture, respiratory (NON-Expectorated)     Status: None (Preliminary result)   Collection Time: 09/07/17  4:20 PM  Result Value Ref Range Status   Specimen Description SPUTUM  Final   Special Requests Immunocompromised Reflexed from [id number]  Final   Gram Stain   Final    ABUNDANT WBC PRESENT, PREDOMINANTLY PMN ABUNDANT GRAM POSITIVE COCCI MODERATE GRAM NEGATIVE RODS    Culture   Final    CULTURE REINCUBATED FOR BETTER GROWTH Performed at [facility], [address] [place], [place] [address]    Report Status PENDING  Incomplete  Respiratory Panel by PCR     Status: Abnormal   Collection Time: 09/07/17  6:00 PM  Result Value Ref Range Status   Adenovirus NOT DETECTED NOT DETECTED Final   Coronavirus 229E NOT DETECTED NOT DETECTED Final   Coronavirus HKU1 NOT DETECTED NOT DETECTED Final   Coronavirus NL63 NOT DETECTED NOT DETECTED Final   Coronavirus OC43 NOT DETECTED NOT DETECTED Final   Metapneumovirus DETECTED (A) NOT DETECTED Final   Rhinovirus / Enterovirus NOT DETECTED NOT DETECTED Final   Influenza A NOT DETECTED NOT DETECTED Final   Influenza B NOT DETECTED NOT DETECTED Final   Parainfluenza Virus 1 NOT DETECTED NOT DETECTED Final   Parainfluenza Virus 2 NOT DETECTED NOT DETECTED Final   Parainfluenza Virus 3 NOT DETECTED NOT DETECTED Final   Parainfluenza Virus 4 NOT DETECTED NOT DETECTED Final   Respiratory Syncytial Virus NOT DETECTED NOT DETECTED Final   Bordetella pertussis NOT DETECTED NOT DETECTED Final   Chlamydophila pneumoniae NOT DETECTED NOT DETECTED Final   Mycoplasma pneumoniae NOT DETECTED NOT DETECTED Final    Comment: Performed at [facility], [place] [address] [place], [place] [address]  MRSA PCR Screening     Status: None   Collection Time: 09/07/17  9:04 PM  Result Value Ref Range Status   MRSA by PCR NEGATIVE NEGATIVE Final    Comment:        The GeneXpert  MRSA Assay (FDA approved for NASAL specimens only), is one component of a comprehensive MRSA colonization surveillance program. It is not intended to diagnose MRSA infection nor to guide or monitor treatment for MRSA infections. Performed at [facility], [place] [address] [place], [place] [address]   Culture, blood (Routine X 2) w Reflex to ID Panel     Status: None (Preliminary result)   Collection Time: 09/08/17  6:12 PM  Result Value Ref Range Status   Specimen Description BLOOD RIGHT ANTECUBITAL  Final   Special Requests   Final    BOTTLES DRAWN AEROBIC AND ANAEROBIC Blood Culture adequate volume   Culture   Final    NO GROWTH < 24 HOURS Performed at [facility], [place] [address] [place],  [address]    Report Status PENDING  Incomplete  Culture, blood (Routine X 2) w Reflex to ID Panel     Status: None (Preliminary result)   Collection Time: 09/08/17  6:14 PM  Result Value Ref Range Status   Specimen Description BLOOD LEFT HAND  Final   Special Requests   Final    BOTTLES DRAWN AEROBIC ONLY Blood Culture adequate volume  Culture   Final    NO GROWTH < 24 HOURS Performed at [facility], [place] [address] [place], [place] [address]    Report Status PENDING  Incomplete    Studies/Results: Dg Chest Port 1 View  Result Date: 09/08/2017 CLINICAL DATA:  Shortness of breath. EXAM: PORTABLE CHEST 1 VIEW COMPARISON:  09/07/2017 FINDINGS: Lungs are adequately inflated with continued bibasilar airspace opacification likely infection. Cannot exclude component of left pleural fluid. Cardiomediastinal silhouette and remainder of the exam is unchanged. IMPRESSION: Stable bibasilar airspace process likely pneumonia. Possible small amount of left pleural fluid. Electronically Signed   By: Marin Olp M.D.   On: 09/08/2017 10:48   Dg Chest Port 1 View  Result Date: 09/07/2017 CLINICAL DATA:  Fever for 4-5 days.  Weakness.  HIV. EXAM: PORTABLE CHEST 1 VIEW  COMPARISON:  02/27/2008. FINDINGS: Normal cardiomediastinal silhouette. BILATERAL pulmonary opacities, LEFT greater than RIGHT consistent with acute pneumonia. There may be a small LEFT effusion. No pneumothorax. Bones unremarkable. Worsening aeration from priors. IMPRESSION: BILATERAL pulmonary opacities, LEFT greater than RIGHT consistent with acute pneumonia. Given that the patient is HIV positive, it is possible that the observed infiltrates could be either a typical or opportunistic infection. Electronically Signed   By: Staci Righter M.D.   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 male with HIV disease perfectly controlled with [name], chronic kidney disease, who developed fever and malaise and was given treatment with Tamiflu for influenza though test was negative then worsened and ultimately was seen at [facility] where he had a syncopal event and was added with a septic picture.  He has been treated with vancomycin and cefepime then vancomycin and ceftriaxone and blood cultures have identified a methicillin-resistant coagulase-negative staphylococcal species.  Yesterday when I talked to the microbiology lab it seemed that this organism was growing in 2 different cultures but it is not displaying as such in epic.  I called microbiology yet again today to see clarity but they will have to do further investigation.  #1 Presumed MR Coag negative for coccal bacteremia: Resumption is that this happened after his initial Metapneumo virus and he had a superinfection with this organism.  I have ordered 2D echocardiogram and will continue vancomycin repeat blood cultures are without growth.  #2 CAP with human [place] pneumo virus plus bacterial superinfection continuing vancomycin  #3 HIV  perfectly controlled with [name]  I spent greater than 35  minutes with the patient including greater than 50% of time in face to face counsel of the patient and in coordination of his care with microbiology lab.  Dr. Baxter Flattery to take over the service tomorrow.      LOS: 2 days   Alcide Evener 09/09/2017, 1:23 PM

## 2017-09-09 NOTE — Progress Notes (Signed)
Pharmacy Antibiotic Note  Lee [name] is a 37 y.o. male admitted on 09/07/2017 with suspected PNA, now found to have coag-negative Staph bacteremia. Continues on vancomycin. Initially admitted with AKI and Scr up to 3.41 (BL ~1.5), now down to 1.61. Normalized CrCl ~65 mL/min. UOP also improving, ~4L out yesterday (1.3 mg/kg/hr). WBC down 13.7, currently AF.  Plan: Increase to vancomycin 1000 mg q12h F/u renal function, culture results, LOT, and vanc levels as indicated  Height: 6\' 4"  (193 cm) Weight: 281 lb 15.5 oz (127.9 kg) IBW/kg (Calculated) : 86.8  Temp (24hrs), Avg:99 F (37.2 C), Min:97.8 F (36.6 C), Max:99.9 F (37.7 C)  Recent Labs  Lab 09/07/17 1544 09/07/17 1600 09/07/17 1740 09/07/17 2222 09/08/17 0057 09/08/17 1814 09/09/17 0520  WBC 17.8*  --   --   --  17.1*  --  13.7*  CREATININE 3.41*  --   --   --  2.37*  --  1.61*  LATICACIDVEN  --  3.68* 3.65* 2.9* 2.2*  --   --   VANCORANDOM  --   --   --   --   --  5  --     Estimated Creatinine Clearance: 94.4 mL/min (A) (by C-G formula based on SCr of 1.61 mg/dL (H)).    Allergies  Allergen Reactions  . Lisinopril Other (See Comments)    Dry cough    . Nickel Rash   Antimicrobials this admission: Vanc 2/15 >> Cefepime 2/15 x 1 Rocephin 2/15 >> 2/16 Azithromycin 2/15 >> 2/16  Dose adjustments this admission: 2/16 VR=5 >> schedule 1500 mg q24h 2/17 Scr improved, norm CrCl ~65 >> inc to 1000mg  q12h  Microbiology results: 2/16 BCx x2: sent 2/15 BCID: coag neg staph w/ methicillin resistance 2/15 BCx x2: GPC in 2/4 - true bacteremia per ID 2/15 Resp panel: + metapneumovirus 2/15 MRSA PCR: neg 2/15 Sputum: mod GNR, abundant GPC  Thank you for allowing pharmacy to be a part of this patient's care.  Mila Merry Gerarda Fraction, PharmD PGY1 Pharmacy Resident Pager: [phone] 09/09/2017 7:26 AM

## 2017-09-10 ENCOUNTER — Inpatient Hospital Stay (HOSPITAL_COMMUNITY): Payer: BLUE CROSS/BLUE SHIELD

## 2017-09-10 DIAGNOSIS — I361 Nonrheumatic tricuspid (valve) insufficiency: Secondary | ICD-10-CM

## 2017-09-10 DIAGNOSIS — J123 Human metapneumovirus pneumonia: Secondary | ICD-10-CM

## 2017-09-10 LAB — CULTURE, BLOOD (ROUTINE X 2): Special Requests: ADEQUATE

## 2017-09-10 LAB — COMPREHENSIVE METABOLIC PANEL
ALT: 11 U/L — ABNORMAL LOW (ref 17–63)
AST: 13 U/L — ABNORMAL LOW (ref 15–41)
Albumin: 2.4 g/dL — ABNORMAL LOW (ref 3.5–5.0)
Alkaline Phosphatase: 65 U/L (ref 38–126)
Anion gap: 7 (ref 5–15)
BUN: 16 mg/dL (ref 6–20)
CO2: 19 mmol/L — ABNORMAL LOW (ref 22–32)
Calcium: 7.9 mg/dL — ABNORMAL LOW (ref 8.9–10.3)
Chloride: 112 mmol/L — ABNORMAL HIGH (ref 101–111)
Creatinine, Ser: 1.55 mg/dL — ABNORMAL HIGH (ref 0.61–1.24)
GFR calc Af Amer: >60 mL/min (ref >60)
GFR calc non Af Amer: 57 mL/min — ABNORMAL LOW (ref >60)
Glucose, Bld: 85 mg/dL (ref 65–99)
Potassium: 3.9 mmol/L (ref 3.5–5.1)
Sodium: 138 mmol/L (ref 135–145)
Total Bilirubin: 0.6 mg/dL (ref 0.3–1.2)
Total Protein: 7.2 g/dL (ref 6.5–8.1)

## 2017-09-10 LAB — HEMOGLOBIN A1C
Hgb A1c MFr Bld: 5.1 % (ref 4.8–5.6)
Mean Plasma Glucose: 99.67 mg/dL

## 2017-09-10 LAB — ECHOCARDIOGRAM COMPLETE
Height: 76 in
Weight: 4511.49 oz

## 2017-09-10 LAB — CBC WITH DIFFERENTIAL/PLATELET
Basophils Absolute: 0 10*3/uL (ref 0.0–0.1)
Basophils Relative: 0 %
Eosinophils Absolute: 0.3 10*3/uL (ref 0.0–0.7)
Eosinophils Relative: 3 %
HCT: 35.9 % — ABNORMAL LOW (ref 39.0–52.0)
Hemoglobin: 12.6 g/dL — ABNORMAL LOW (ref 13.0–17.0)
Lymphocytes Relative: 20 %
Lymphs Abs: 1.9 10*3/uL (ref 0.7–4.0)
MCH: 31.6 pg (ref 26.0–34.0)
MCHC: 35.1 g/dL (ref 30.0–36.0)
MCV: 90 fL (ref 78.0–100.0)
Monocytes Absolute: 0.4 10*3/uL (ref 0.1–1.0)
Monocytes Relative: 4 %
Neutro Abs: 6.9 10*3/uL (ref 1.7–7.7)
Neutrophils Relative %: 73 %
Platelets: 158 10*3/uL (ref 150–400)
RBC: 3.99 MIL/uL — ABNORMAL LOW (ref 4.22–5.81)
RDW: 13.9 % (ref 11.5–15.5)
WBC: 9.5 10*3/uL (ref 4.0–10.5)

## 2017-09-10 LAB — CULTURE, RESPIRATORY W GRAM STAIN: Culture: NORMAL

## 2017-09-10 LAB — CD4/CD8 (T-HELPER/T-SUPPRESSOR CELL)
CD4 absolute: 480 /uL — ABNORMAL LOW (ref 500–1900)
CD4%: 31 % (ref 30.0–60.0)
CD8 T Cell Abs: 500 /uL (ref 230–1000)
CD8tox: 32 % (ref 15.0–40.0)
Ratio: 0.96 — ABNORMAL LOW (ref 1.0–3.0)
Total lymphocyte count: 1570 /uL (ref 1000–4000)

## 2017-09-10 LAB — STREP PNEUMONIAE URINARY ANTIGEN: Strep Pneumo Urinary Antigen: POSITIVE — AB

## 2017-09-10 LAB — PHOSPHORUS: Phosphorus: 2.9 mg/dL (ref 2.5–4.6)

## 2017-09-10 LAB — MAGNESIUM: Magnesium: 1.6 mg/dL — ABNORMAL LOW (ref 1.7–2.4)

## 2017-09-10 MED ORDER — MAGNESIUM SULFATE 2 GM/50ML IV SOLN
2.0000 g | Freq: Once | INTRAVENOUS | Status: AC
  Administered 2017-09-10: 2 g via INTRAVENOUS
  Filled 2017-09-10: qty 50

## 2017-09-10 NOTE — Progress Notes (Signed)
  Echocardiogram 2D Echocardiogram has been performed.  Jennette Dubin 09/10/2017, 1:57 PM

## 2017-09-10 NOTE — Progress Notes (Signed)
Pt up to walk, he stated it was his first time since admission walking around. Pt was significantly weak and very slow moving but able to make it around unit, had periods where he was very SOB. If he does not improve may need to be seen by PT.

## 2017-09-10 NOTE — Progress Notes (Signed)
[facility] for Infectious Disease  Date of Admission:  09/07/2017     Total days of antibiotics 4         Patient ID: Lee [name] is a 37 y.o. 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 bictegravir-emtricitabine-tenofovir AF  1 tablet Oral Daily  . guaiFENesin  1,200 mg Oral BID  . heparin injection (subcutaneous)  5,000 Units Subcutaneous Q8H  . ipratropium  0.5 mg Nebulization TID  . levalbuterol  0.63 mg Nebulization TID  . loratadine  10 mg Oral Daily  . mouth rinse  15 mL Mouth Rinse BID    Interval Hx : Feeling better. Still with cough and some body aches and chills. Fevers have abated and WBC normalized. PIV site is very sore and swollen.   Allergies  Allergen Reactions  . Lisinopril Other (See Comments)    Dry cough    . Nickel Rash    OBJECTIVE: Vitals:   09/09/17 2349 09/10/17 0346 09/10/17 0837 09/10/17 0900  BP: (!) 99/54 132/73  113/73  Pulse: 88 83  100  Resp: (!) 27 20  (!) 21  Temp: 98.6 F (37 C) 98 F (36.7 C)    TempSrc: Oral Oral    SpO2: 100% 99% 97% 97%  Weight:      Height:       Body mass index is 34.32 kg/m.  Physical Exam  Constitutional: He is oriented to person, place, and time and well-developed, well-nourished, and in no distress.  HENT:  Mouth/Throat: Oropharynx is clear and moist. No oral lesions. Normal dentition. No dental caries.  Eyes: No scleral icterus.  Cardiovascular: Normal rate, regular rhythm and normal heart sounds.  No murmur heard. Pulmonary/Chest: Effort normal. No tachypnea. No respiratory distress. He has rales in the right upper field, the right middle field, the left upper field and the left middle field.  Abdominal: Soft. He exhibits no distension. There  is no tenderness.  Lymphadenopathy:    He has no cervical adenopathy.  Neurological: He is alert and oriented to person, place, and time.  Skin: Skin is warm and dry. No rash noted.  Psychiatric: Mood and affect normal.  Vitals reviewed.   Lab Results Lab Results  Component Value Date   WBC 9.5 09/10/2017   HGB 12.6 (L) 09/10/2017   HCT 35.9 (L) 09/10/2017   MCV 90.0 09/10/2017   PLT 158 09/10/2017    Lab Results  Component Value Date   CREATININE 1.55 (H) 09/10/2017   BUN 16 09/10/2017   NA 138 09/10/2017   K 3.9 09/10/2017   CL 112 (H) 09/10/2017   CO2 19 (L) 09/10/2017    Lab Results  Component Value Date   ALT 11 (L) 09/10/2017   AST 13 (L) 09/10/2017   ALKPHOS 65 09/10/2017   BILITOT 0.6 09/10/2017     Microbiology: Viral Panel >> (+) Metapneumovirus, Flu (-)  BCx 2/15 >> 1/2 with GPC in aerobic and anaerobic bottles  BCx 2/16 >> NG in both sets x 2 days   ASSESSMENT: 37 y.o. male with well controlled HIV infection and preserved immune function (CD4 480 currently) admitted with symptoms suggestive of sepsis. He was initially presumed to have influenza but found to have human metapneumovirus.  Chest xray with bilateral infiltrates and was started on vancomycin/ceftriaxone to target pneumonia. He is now maintained only on vancomycin. BCx 1/2 with coag negative staph from one set on 2/15.    PLAN: 1. D/W Micro lab - should have ID on organism today.  2. Would hold off on TEE.  3. Continue vancomycin for now until #1.  4. Pneumonia - bacterial vs viral pneumonia? Pro calcitonin elevated at 29 with admission so favor bacterial infection. He is improved but still with cough.   Janene Madeira, MSN, NP-C [facility] for Infectious Disease [facility] Cell: [phone] Pager: [phone]  09/10/2017  10:27 AM   ADDENDUM:  BCx with staph hominis - contaminant; would not evaluate any further  Strep pneumonia antigen (+) which helps make more  sense considering the severity of his illness. With cessation of his fevers and normalization of WBC on Vancomycin I think he should be ready to be transitioned to oral therapy - would give Doxycycline BID for 4 more days. If he is not medically ready for discharge would change vancomycin to ceftriaxone.   Janene Madeira, MSN, NP-C [facility] for Infectious [place] Cell: [phone] Pager: [phone]  09/10/2017  4:00 PM

## 2017-09-10 NOTE — Progress Notes (Signed)
PROGRESS NOTE    Lee [name]  EXH:[id number] DOB: Jun 09, 1981 DOA: 09/07/2017 PCP: [name], NP   Brief Narrative:  Lee [name] is a 37 y.o. male with medical history significant of HIV, HTN, syphilis treated and other comorbids who presented with a cough for the past 5 days. Reports having fevers up to 103.3 and chills with diaphoresis. Cough is productive of yellow and thick sputum. Had bleeding from his nose and possibly blood tinged sputum. He presented to urgent care was started on Tamiflu but later on influenza screen came back negative. He had decreased oral intake reports feeling dehydrated and exhausted. Reports taking all of his HIV medications. Last CD4 >400 in January. Was found to be Septic and CXR revealed Bilateral PNA. Sepsis protocol initiated and Started on IV Abx. Case was Discussed with ID who did not feel as if it was PCP Pneumonia. Patient admitted to SDU and is currently being treated. 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 Species Bacteremia and CAP in the setting of Metapneumovirus, improved -Admitted to SDU -Sepsis Physiology improving some -IV Abx changed by Infectious Diseases to IV Vancomycin  -Repeat CXR this AM  -S/p 3 Liters of IVF boluses; Decrease NS from 125 mL/hr to 75 mL/hr -Added Breathing Treatments with Xopenex/Atrovent -C/w Guaifenesin -Blood Cx's on 09/07/17 was Positive for Methicillin Resistance Coag Negative Staphylococcus Species in 1/2 Cx's however 2nd Cx showed Gram Positive Cocci; Now second Cx showing NGTD at 2 days still; Called Microbiology and they don't know what happened  and are investigating further; PER Dr. Tommy Medal if Cx Grows out Staph Lugdenesis and would treat as Real Infection and Pursue TEE; If Coag Neagtive Staph Cx then it is a contaminant and can focus on treating the patient for Bacterial PNA Superimposed on Metapneumovirus Infection -Repeat Blood Cx 09/08/17 still showing NGTD <24 hours  -Check Strep Pneumo Urine Ag; Ordered and Pending still; Will add Urine Legionella Ag as well  -Discussed Case with ID who feels that is not PCP Pneumonia and recommended continuing current Abx Regimen with just IV Vancomycin; Appreciated ID recc's -Procalcitonin was 29.96 -Lactic Acid Level decreasing from 3.68 -> 3.65 -> 2.9 -> 2.2 -WBC went from 17.8 -> 17.1 -> 13.7 -> 9.5 -May consider IV Steroids if not improving more  -Checked Gram Stain showed:   ABUNDANT WBC PRESENT, PREDOMINANTLY PMN  ABUNDANT GRAM POSITIVE COCCI  MODERATE GRAM NEGATIVE RODS      Sputum Cx pending and re-incubated for better growth -Repeat CXR this AM and if worsening will get CT Chest w/o Contrast -ID ordering Transthoracic ECHOCardiogram and is pending to be done. Per ID if Staph Lugdenesis will need TEE as well -Repeat CBC in AM  Lactic Acidosis -From Sepsis and Dehydration from poor oral intake  -Improving. Lactic Acid Level decreasing from 3.68 -> 3.65 -> 2.9 -> 2.2 -C/w IVF Rehydration but at a decreased rate at 75 mL/hr  AKI on CKD Stage 3 -Baseline Cr raging from 1.1-1.5 -BUN/Cr on Admission was 33/3.41 and now improved to 16/1.55 -C/w IVF Rehydration at reduced rate of NS at 75 mL/hr -Avoid Nephrotoxic Medications as possible -Repeat CMP in AM  HIV -Patient states he is compliant with Medications -Last CD4 Count 07/26/17 was 570; Viral Load was <20 -C/w Bictegravir-Emtricitabine-Tenofovir 50-200-25 mg 1 tab po Daily  -Discussed with ID and Dr. Tommy Medal and appreciated his Evaluation and Recommendations; Dr. Baxter Flattery to follow   Hyperbilirubinemia -Patient's T Bili went  from 2.4 -> 1.5 -> 0.8 -> 0.6 -Continue to Monitor and Repeat CMP in AM  Hyponatremia, improving -Patient's Na+ was 131 and improved to 138 -Continue IVF Rehydration as above and Repeat CMP in AM  Hyperglycemia -Maybe Reactive in the Setting of Sepsis and Infection -Checking HbA1c this AM -Continue to Monitor BS and if >200 start Sensitive Novolog SSI  Thrombocytopenia -Patient's Platelet Count went from 161 -> 143 -> 145 -> 158 -Likely in the Setting of Infection -Continue to Monitor for S/Sx of Bleeding -Repeat CBC in AM  Normocytic Anemia -Patient's Hb/Hct went from 15.4/43.2 -> 13.6/38.9 -> 12.4/35.5 -> 12.6/35.9 -Likely Dilutional Drop from IVF Rehydration  -Continue to monitor for S/Sx of Bleeding -Repeat CBC in AM   Hypomagnesemia -Patient's Mag Level was 1.6 this AM -Replete with IV 2 grams of Mag Sulfate -Continue to Monitor and Replete As Necessary -Repeat Mag Level in AM   DVT prophylaxis: Heparin 5,000 units sq q8h Code Status: FULL CODE Family Communication: No family present at bedside  Disposition Plan: Transfer to Telemetry   Consultants:   Infectious Diseases Dr. Dietrich Pates [facility] was consulted by EDP who felt patient did not meet ICU Criteria.    Procedures:  ECHOCARDIOGRAM   Antimicrobials:  Anti-infectives (From admission, onward)   Start     Dose/Rate Route Frequency Ordered Stop   09/09/17 1600  vancomycin (VANCOCIN) IVPB 1000 mg/200 mL premix     1,000 mg 200 mL/hr over 60 Minutes Intravenous Every 12 hours 09/09/17 0735     09/08/17 2200  vancomycin (VANCOCIN) 1,500 mg in sodium chloride 0.9 % 500 mL IVPB  Status:  Discontinued     1,500 mg 250 mL/hr over 120 Minutes Intravenous Every 24 hours 09/08/17 2058 09/09/17 0735   09/08/17 1800  ceFEPIme (MAXIPIME) 2 g in sodium chloride 0.9 % 100 mL IVPB  Status:  Discontinued     2 g 200 mL/hr over 30 Minutes Intravenous Every 24 hours 09/07/17 1637 09/07/17 1648   09/08/17 1800   cefTRIAXone (ROCEPHIN) 2 g in sodium chloride 0.9 % 100 mL IVPB  Status:  Discontinued     2 g 200 mL/hr over 30 Minutes Intravenous Every 24 hours 09/07/17 1648 09/08/17 1756   09/08/17 1000  bictegravir-emtricitabine-tenofovir AF ([name]) 50-200-25 MG per tablet 1 tablet     1 tablet Oral Daily 09/07/17 2200     09/07/17 2300  azithromycin (ZITHROMAX) 500 mg in sodium chloride 0.9 % 250 mL IVPB  Status:  Discontinued     500 mg 250 mL/hr over 60 Minutes Intravenous Daily at bedtime 09/07/17 2200 09/08/17 1756   09/07/17 2215  cefTRIAXone (ROCEPHIN) 1 g in sodium chloride 0.9 % 100 mL IVPB  Status:  Discontinued     1 g 200 mL/hr over 30 Minutes Intravenous Every 24 hours 09/07/17 2200 09/07/17 2203   09/07/17 1928  vancomycin (VANCOCIN) 500 MG powder    Comments:  Peel, Adrienne   : cabinet override      09/07/17 1928 09/08/17 0729   09/07/17 1900  vancomycin (VANCOCIN) 500 mg in sodium chloride 0.9 % 100 mL IVPB     500 mg 100 mL/hr over  60 Minutes Intravenous  Once 09/07/17 1646 09/07/17 2035   09/07/17 1830  vancomycin (VANCOCIN) 500 mg in sodium chloride 0.9 % 100 mL IVPB  Status:  Discontinued     500 mg 100 mL/hr over 60 Minutes Intravenous  Once 09/07/17 1603 09/07/17 1611   09/07/17 1800  vancomycin (VANCOCIN) IVPB 1000 mg/200 mL premix     1,000 mg 200 mL/hr over 60 Minutes Intravenous  Once 09/07/17 1646 09/07/17 1923   09/07/17 1730  vancomycin (VANCOCIN) IVPB 1000 mg/200 mL premix  Status:  Discontinued     1,000 mg 200 mL/hr over 60 Minutes Intravenous  Once 09/07/17 1603 09/07/17 1611   09/07/17 1700  vancomycin (VANCOCIN) IVPB 1000 mg/200 mL premix     1,000 mg 200 mL/hr over 60 Minutes Intravenous  Once 09/07/17 1646 09/07/17 1758   09/07/17 1630  vancomycin (VANCOCIN) IVPB 1000 mg/200 mL premix  Status:  Discontinued     1,000 mg 200 mL/hr over 60 Minutes Intravenous  Once 09/07/17 1603 09/07/17 1611   09/07/17 1605  ceFEPIme (MAXIPIME) 2 g injection    Comments:   Peel, Adrienne   : cabinet override      09/07/17 1605 09/08/17 0414   09/07/17 1545  vancomycin (VANCOCIN) IVPB 1000 mg/200 mL premix  Status:  Discontinued     1,000 mg 200 mL/hr over 60 Minutes Intravenous  Once 09/07/17 1541 09/07/17 1542   09/07/17 1545  piperacillin-tazobactam (ZOSYN) IVPB 3.375 g  Status:  Discontinued     3.375 g 100 mL/hr over 30 Minutes Intravenous  Once 09/07/17 1541 09/07/17 1542   09/07/17 1545  ceFEPIme (MAXIPIME) 2 g in sodium chloride 0.9 % 100 mL IVPB     2 g 200 mL/hr over 30 Minutes Intravenous  Once 09/07/17 1542 09/07/17 1646   09/07/17 1545  vancomycin (VANCOCIN) IVPB 1000 mg/200 mL premix  Status:  Discontinued     1,000 mg 200 mL/hr over 60 Minutes Intravenous  Once 09/07/17 1542 09/07/17 1602     Subjective: Seen and examined this AM and felt better but didn't like the food. No CP and SOB stable. Has mild abdominal pain. States he hasn't been coughing as much this AM but had rhinorrhea and has been blowing his nose more. No lightheadedness or dizziness and no other complaints currently.   Objective: Vitals:   09/09/17 2006 09/09/17 2349 09/10/17 0346 09/10/17 0837  BP:  (!) 99/54 132/73   Pulse:  88 83   Resp:  (!) 27 20   Temp:  98.6 F (37 C) 98 F (36.7 C)   TempSrc:  Oral Oral   SpO2: 100% 100% 99% 97%  Weight:      Height:        Intake/Output Summary (Last 24 hours) at 09/10/2017 2841 Last data filed at 09/10/2017 0400 Gross per 24 hour  Intake 3185 ml  Output 1375 ml  Net 1810 ml   Filed Weights   09/07/17 1531 09/07/17 2104  Weight: 126.6 kg (279 lb) 127.9 kg (281 lb 15.5 oz)   Examination: Physical Exam:  Constitutional: WN/WD AAM in NAD appears calm and comfortable eating breakfast.  Eyes: Sclerae anicteric. Lids Normal ENMT: External ears and nose appear normal. Grossly normal hearing Neck: Supple with no JVD Respiratory: Diminished to auscultation but breathing is unlabored. No appreciable wheezing but has mild  rales bilaterally. Weaned off of supplemental O2 today and maintaining saturations Cardiovascular: RRR; No appreciable m/r/g. No LE Edema Abdomen: Soft, Mildly tender, ND.  Bowel sounds present GU: Deferred Musculoskeletal: No contractures; No cyanosis Skin: Warm and Dry. No appreciable rashes or lesions on a limited skin eval Neurologic: CN 2-12 grossly intact. No appreciable focal deficits Psychiatric: Normal mood and affect. Intact judgement and insight  Data Reviewed: I have personally reviewed following labs and imaging studies  CBC: Recent Labs  Lab 09/07/17 1544 09/08/17 0057 09/09/17 0520 09/10/17 0355  WBC 17.8* 17.1* 13.7* 9.5  NEUTROABS 15.7* 15.1* 11.3* 6.9  HGB 15.4 13.6 12.4* 12.6*  HCT 43.2 38.9* 35.5* 35.9*  MCV 87.4 88.6 88.8 90.0  PLT 161 143* 145* 102   Basic Metabolic Panel: Recent Labs  Lab 09/07/17 1544 09/08/17 0057 09/09/17 0520 09/10/17 0355  NA 131* 134* 138 138  K 4.2 4.2 3.8 3.9  CL 103 108 110 112*  CO2 17* 17* 18* 19*  GLUCOSE 103* 115* 97 85  BUN 33* 28* 18 16  CREATININE 3.41* 2.37* 1.61* 1.55*  CALCIUM 8.0* 7.5* 7.8* 7.9*  MG  --   --  1.7 1.6*  PHOS  --   --  2.6 2.9   GFR: Estimated Creatinine Clearance: 98 mL/min (A) (by C-G formula based on SCr of 1.55 mg/dL (H)). Liver Function Tests: Recent Labs  Lab 09/07/17 1544 09/08/17 0057 09/09/17 0520 09/10/17 0355  AST 37 26 15 13*  ALT 19 16* 12* 11*  ALKPHOS 71 64 63 65  BILITOT 2.4* 1.5* 0.8 0.6  PROT 8.5* 7.0 6.9 7.2  ALBUMIN 3.2* 2.5* 2.4* 2.4*   No results for input(s): LIPASE, AMYLASE in the last 168 hours. No results for input(s): AMMONIA in the last 168 hours. Coagulation Profile: Recent Labs  Lab 09/07/17 2222  INR 1.55   Cardiac Enzymes: Recent Labs  Lab 09/07/17 1544  TROPONINI <0.03   BNP (last 3 results) No results for input(s): PROBNP in the last 8760 hours. HbA1C: No results for input(s): HGBA1C in the last 72 hours. CBG: Recent Labs  Lab  09/07/17 1542  GLUCAP 92   Lipid Profile: No results for input(s): CHOL, HDL, LDLCALC, TRIG, CHOLHDL, LDLDIRECT in the last 72 hours. Thyroid Function Tests: Recent Labs    09/07/17 2235  TSH 1.773   Anemia Panel: No results for input(s): VITAMINB12, FOLATE, FERRITIN, TIBC, IRON, RETICCTPCT in the last 72 hours. Sepsis Labs: Recent Labs  Lab 09/07/17 1600 09/07/17 1740 09/07/17 2222 09/08/17 0057  PROCALCITON  --   --  29.96  --   LATICACIDVEN 3.68* 3.65* 2.9* 2.2*    Recent Results (from the past 240 hour(s))  Blood Culture (routine x 2)     Status: None (Preliminary result)   Collection Time: 09/07/17  3:40 PM  Result Value Ref Range Status   Specimen Description   Final    BLOOD LEFT ARM Performed at [facility], [address] [place], [place] [address]    Special Requests   Final    BOTTLES DRAWN AEROBIC AND ANAEROBIC Blood Culture adequate volume Performed at [facility], [address] [place], [place] [address]    Culture  Setup Time   Final    GRAM POSITIVE COCCI IN BOTH AEROBIC AND ANAEROBIC BOTTLES CRITICAL RESULT CALLED TO, READ BACK BY AND VERIFIED WITH: Ailene Rud AT 3536 09/08/17 BY L BENFIELD    Culture   Final    GRAM POSITIVE COCCI CULTURE REINCUBATED FOR BETTER GROWTH Performed at [facility], [place] [address] [place], [place] [address]  Report Status PENDING  Incomplete  Blood Culture (routine x 2)     Status: None (Preliminary result)   Collection Time: 09/07/17  3:40 PM  Result Value Ref Range Status   Specimen Description   Final    BLOOD RIGHT HAND Performed at [facility] [address] [place], [place] [address]    Special Requests   Final    BOTTLES DRAWN AEROBIC AND ANAEROBIC Blood Culture adequate volume Performed at [facility], [address] [place], [place] [address]    Culture   Final    NO GROWTH 2 DAYS Performed at [facility], [place] [address],  [place], [place] [address]    Report Status PENDING  Incomplete  Blood Culture ID Panel (Reflexed)     Status: Abnormal   Collection Time: 09/07/17  3:40 PM  Result Value Ref Range Status   Enterococcus species NOT DETECTED NOT DETECTED Final   Listeria monocytogenes NOT DETECTED NOT DETECTED Final   Staphylococcus species DETECTED (A) NOT DETECTED Final    Comment: Methicillin (oxacillin) resistant coagulase negative staphylococcus. Possible blood culture contaminant (unless isolated from more than one blood culture draw or clinical case suggests pathogenicity). No antibiotic treatment is indicated for blood  culture contaminants. CRITICAL RESULT CALLED TO, READ BACK BY AND VERIFIED WITH: M MACCIA,PHARMD AT 1618 09/08/17 BY L BENFIELD    Staphylococcus aureus NOT DETECTED NOT DETECTED Final   Methicillin resistance DETECTED (A) NOT DETECTED Final    Comment: CRITICAL RESULT CALLED TO, READ BACK BY AND VERIFIED WITH: M MACCIA,PHARMD AT 1618 09/08/17 BY L BENFIELD    Streptococcus species NOT DETECTED NOT DETECTED Final   Streptococcus agalactiae NOT DETECTED NOT DETECTED Final   Streptococcus pneumoniae NOT DETECTED NOT DETECTED Final   Streptococcus pyogenes NOT DETECTED NOT DETECTED Final   Acinetobacter baumannii NOT DETECTED NOT DETECTED Final   Enterobacteriaceae species NOT DETECTED NOT DETECTED Final   Enterobacter cloacae complex NOT DETECTED NOT DETECTED Final   Escherichia coli NOT DETECTED NOT DETECTED Final   Klebsiella oxytoca NOT DETECTED NOT DETECTED Final   Klebsiella pneumoniae NOT DETECTED NOT DETECTED Final   Proteus species NOT DETECTED NOT DETECTED Final   Serratia marcescens NOT DETECTED NOT DETECTED Final   Haemophilus influenzae NOT DETECTED NOT DETECTED Final   Neisseria meningitidis NOT DETECTED NOT DETECTED Final   Pseudomonas aeruginosa NOT DETECTED NOT DETECTED Final   Candida albicans NOT DETECTED NOT DETECTED Final   Candida glabrata NOT DETECTED NOT DETECTED  Final   Candida krusei NOT DETECTED NOT DETECTED Final   Candida parapsilosis NOT DETECTED NOT DETECTED Final   Candida tropicalis NOT DETECTED NOT DETECTED Final    Comment: Performed at [facility], [address], [place], [place] [address]  Culture, expectorated sputum-assessment     Status: None   Collection Time: 09/07/17  4:20 PM  Result Value Ref Range Status   Specimen Description SPUTUM  Final   Special Requests   Final    Immunocompromised Performed at [facility] [address] [place], [place] [address]    Sputum evaluation THIS SPECIMEN IS ACCEPTABLE FOR SPUTUM CULTURE  Final   Report Status 09/08/2017 FINAL  Final  Culture, respiratory (NON-Expectorated)     Status: None (Preliminary result)   Collection Time: 09/07/17  4:20 PM  Result Value Ref Range Status   Specimen Description SPUTUM  Final   Special Requests Immunocompromised Reflexed from [id number]  Final   Gram Stain   Final    ABUNDANT WBC PRESENT, PREDOMINANTLY PMN ABUNDANT GRAM POSITIVE COCCI MODERATE GRAM NEGATIVE RODS    Culture   Final    CULTURE REINCUBATED FOR BETTER GROWTH Performed at [facility], [place] [address], [place], [place] [address]    Report Status PENDING  Incomplete  Respiratory Panel by PCR     Status: Abnormal   Collection Time: 09/07/17  6:00 PM  Result Value Ref Range Status   Adenovirus NOT DETECTED NOT DETECTED Final   Coronavirus 229E NOT DETECTED NOT DETECTED Final   Coronavirus HKU1 NOT DETECTED NOT DETECTED Final   Coronavirus NL63 NOT DETECTED NOT DETECTED Final   Coronavirus OC43 NOT DETECTED NOT DETECTED Final   Metapneumovirus DETECTED (A) NOT DETECTED Final   Rhinovirus / Enterovirus NOT DETECTED NOT DETECTED Final   Influenza A NOT DETECTED NOT DETECTED Final   Influenza B NOT DETECTED NOT DETECTED Final   Parainfluenza Virus 1 NOT DETECTED NOT DETECTED Final   Parainfluenza Virus 2 NOT DETECTED NOT DETECTED Final   Parainfluenza Virus 3 NOT  DETECTED NOT DETECTED Final   Parainfluenza Virus 4 NOT DETECTED NOT DETECTED Final   Respiratory Syncytial Virus NOT DETECTED NOT DETECTED Final   Bordetella pertussis NOT DETECTED NOT DETECTED Final   Chlamydophila pneumoniae NOT DETECTED NOT DETECTED Final   Mycoplasma pneumoniae NOT DETECTED NOT DETECTED Final    Comment: Performed at [facility], [place] [address] [place], [place] [address]  MRSA PCR Screening     Status: None   Collection Time: 09/07/17  9:04 PM  Result Value Ref Range Status   MRSA by PCR NEGATIVE NEGATIVE Final    Comment:        The GeneXpert MRSA Assay (FDA approved for NASAL specimens only), is one component of a comprehensive MRSA colonization surveillance program. It is not intended to diagnose MRSA infection nor to guide or monitor treatment for MRSA infections. Performed at [facility], [place] [address], [place], [place] [address]   Culture, blood (Routine X 2) w Reflex to ID Panel     Status: None (Preliminary result)   Collection Time: 09/08/17  6:12 PM  Result Value Ref Range Status   Specimen Description BLOOD RIGHT ANTECUBITAL  Final   Special Requests   Final    BOTTLES DRAWN AEROBIC AND ANAEROBIC Blood Culture adequate volume   Culture   Final    NO GROWTH < 24 HOURS Performed at [facility], [place] [address], [place], [place] [address]    Report Status PENDING  Incomplete  Culture, blood (Routine X 2) w Reflex to ID Panel     Status: None (Preliminary result)   Collection Time: 09/08/17  6:14 PM  Result Value Ref Range Status   Specimen Description BLOOD LEFT HAND  Final   Special Requests   Final    BOTTLES DRAWN AEROBIC ONLY Blood Culture adequate volume   Culture   Final    NO GROWTH < 24 HOURS Performed at [facility], [place] [address] [place], [place] [address]    Report Status PENDING  Incomplete    Radiology Studies: Dg Chest Port 1 View  Result Date: 09/08/2017 CLINICAL DATA:  Shortness of breath.  EXAM: PORTABLE CHEST 1 VIEW COMPARISON:  09/07/2017 FINDINGS: Lungs are adequately inflated with continued bibasilar airspace opacification likely infection. Cannot exclude component of left pleural fluid. Cardiomediastinal silhouette and remainder of the exam  is unchanged. IMPRESSION: Stable bibasilar airspace process likely pneumonia. Possible small amount of left pleural fluid. Electronically Signed   By: Marin Olp M.D.   On: 09/08/2017 10:48   Scheduled Meds: . bictegravir-emtricitabine-tenofovir AF  1 tablet Oral Daily  . guaiFENesin  1,200 mg Oral BID  . heparin injection (subcutaneous)  5,000 Units Subcutaneous Q8H  . ipratropium  0.5 mg Nebulization TID  . levalbuterol  0.63 mg Nebulization TID  . loratadine  10 mg Oral Daily  . mouth rinse  15 mL Mouth Rinse BID   Continuous Infusions: . sodium chloride 125 mL/hr at 09/10/17 0516  . magnesium sulfate 1 - 4 g bolus IVPB    . vancomycin Stopped (09/10/17 0453)    LOS: 3 days   Kerney Elbe, DO [facility] Pager [phone]  If 7PM-7AM, please contact night-coverage [email or web address] Password [facility] 09/10/2017, 9:23 AM

## 2017-09-11 ENCOUNTER — Inpatient Hospital Stay (HOSPITAL_COMMUNITY): Payer: BLUE CROSS/BLUE SHIELD

## 2017-09-11 DIAGNOSIS — R109 Unspecified abdominal pain: Secondary | ICD-10-CM

## 2017-09-11 LAB — CBC WITH DIFFERENTIAL/PLATELET
Basophils Absolute: 0 10*3/uL (ref 0.0–0.1)
Basophils Relative: 0 %
Eosinophils Absolute: 0.4 10*3/uL (ref 0.0–0.7)
Eosinophils Relative: 5 %
HCT: 36 % — ABNORMAL LOW (ref 39.0–52.0)
Hemoglobin: 12.3 g/dL — ABNORMAL LOW (ref 13.0–17.0)
Lymphocytes Relative: 24 %
Lymphs Abs: 1.8 10*3/uL (ref 0.7–4.0)
MCH: 30.4 pg (ref 26.0–34.0)
MCHC: 34.2 g/dL (ref 30.0–36.0)
MCV: 88.9 fL (ref 78.0–100.0)
Monocytes Absolute: 0.5 10*3/uL (ref 0.1–1.0)
Monocytes Relative: 6 %
Neutro Abs: 4.9 10*3/uL (ref 1.7–7.7)
Neutrophils Relative %: 65 %
Platelets: 175 10*3/uL (ref 150–400)
RBC: 4.05 MIL/uL — ABNORMAL LOW (ref 4.22–5.81)
RDW: 13.6 % (ref 11.5–15.5)
WBC: 7.5 10*3/uL (ref 4.0–10.5)

## 2017-09-11 LAB — COMPREHENSIVE METABOLIC PANEL
ALT: 11 U/L — ABNORMAL LOW (ref 17–63)
AST: 14 U/L — ABNORMAL LOW (ref 15–41)
Albumin: 2.4 g/dL — ABNORMAL LOW (ref 3.5–5.0)
Alkaline Phosphatase: 60 U/L (ref 38–126)
Anion gap: 9 (ref 5–15)
BUN: 15 mg/dL (ref 6–20)
CO2: 19 mmol/L — ABNORMAL LOW (ref 22–32)
Calcium: 8 mg/dL — ABNORMAL LOW (ref 8.9–10.3)
Chloride: 109 mmol/L (ref 101–111)
Creatinine, Ser: 1.47 mg/dL — ABNORMAL HIGH (ref 0.61–1.24)
GFR calc Af Amer: >60 mL/min (ref >60)
GFR calc non Af Amer: >60 mL/min (ref >60)
Glucose, Bld: 89 mg/dL (ref 65–99)
Potassium: 3.8 mmol/L (ref 3.5–5.1)
Sodium: 137 mmol/L (ref 135–145)
Total Bilirubin: 0.7 mg/dL (ref 0.3–1.2)
Total Protein: 7.2 g/dL (ref 6.5–8.1)

## 2017-09-11 LAB — PHOSPHORUS: Phosphorus: 3.3 mg/dL (ref 2.5–4.6)

## 2017-09-11 LAB — LEGIONELLA PNEUMOPHILA SEROGP 1 UR AG: L. pneumophila Serogp 1 Ur Ag: NEGATIVE

## 2017-09-11 LAB — MAGNESIUM: Magnesium: 1.8 mg/dL (ref 1.7–2.4)

## 2017-09-11 MED ORDER — MAGNESIUM SULFATE 2 GM/50ML IV SOLN
2.0000 g | Freq: Once | INTRAVENOUS | Status: AC
  Administered 2017-09-11: 2 g via INTRAVENOUS
  Filled 2017-09-11: qty 50

## 2017-09-11 MED ORDER — SENNOSIDES-DOCUSATE SODIUM 8.6-50 MG PO TABS
1.0000 | ORAL_TABLET | Freq: Two times a day (BID) | ORAL | Status: DC
  Administered 2017-09-11 (×2): 1 via ORAL
  Filled 2017-09-11 (×3): qty 1

## 2017-09-11 MED ORDER — CEFTRIAXONE SODIUM 1 G IJ SOLR
1.0000 g | INTRAMUSCULAR | Status: DC
  Administered 2017-09-11 – 2017-09-12 (×2): 1 g via INTRAVENOUS
  Filled 2017-09-11 (×2): qty 10

## 2017-09-11 MED ORDER — POLYETHYLENE GLYCOL 3350 17 G PO PACK
17.0000 g | PACK | Freq: Two times a day (BID) | ORAL | Status: DC
  Administered 2017-09-11: 17 g via ORAL
  Filled 2017-09-11 (×2): qty 1

## 2017-09-11 MED ORDER — BISACODYL 10 MG RE SUPP: 10.0000 mg | Freq: Every day | RECTAL | Status: DC | PRN

## 2017-09-11 MED ORDER — SALINE SPRAY 0.65 % NA SOLN
1.0000 | NASAL | Status: DC | PRN
  Filled 2017-09-11: qty 44

## 2017-09-11 NOTE — Evaluation (Signed)
Occupational Therapy Evaluation Patient Details Name: Lee [name] MRN: [id number] DOB: August 27, 1980 Today's Date: 09/11/2017    History of Present Illness 37 y.o. male admitted on 09/07/2017 with PNA   Clinical Impression   Pt is functioning at a supervision level for safety. Educated in modifying ADL to conserve energy and techniques to gradually increase activity tolerance. Pt verbalizing understanding. No further OT needs.    Follow Up Recommendations  No OT follow up    Equipment Recommendations  None recommended by OT    Recommendations for Other Services       Precautions / Restrictions        Mobility Bed Mobility               General bed mobility comments: received in chair  Transfers Overall transfer level: Modified independent Equipment used: None             General transfer comment: increased time, used UEs to push up    Balance                                           ADL either performed or assessed with clinical judgement   ADL                                         General ADL Comments: Supervision for safety.     Vision Baseline Vision/History: No visual deficits Patient Visual Report: No change from baseline       Perception     Praxis      Pertinent Vitals/Pain Pain Assessment: No/denies pain     Hand Dominance Left   Extremity/Trunk Assessment Upper Extremity Assessment Upper Extremity Assessment: Overall WFL for tasks assessed   Lower Extremity Assessment Lower Extremity Assessment: Defer to PT evaluation   Cervical / Trunk Assessment Cervical / Trunk Assessment: Normal   Communication Communication Communication: No difficulties   Cognition Arousal/Alertness: Awake/alert Behavior During Therapy: WFL for tasks assessed/performed Overall Cognitive Status: Within Functional Limits for tasks assessed                                     General  Comments       Exercises     Shoulder Instructions      Home Living Family/patient expects to be discharged to:: Private residence Living Arrangements: Alone Available Help at Discharge: Family;Available PRN/intermittently Type of Home: House Home Access: Level entry     Home Layout: One level     Bathroom Shower/Tub: Tub/shower unit;Walk-in shower   Bathroom Toilet: Standard     Home Equipment: None          Prior Functioning/Environment Level of Independence: Independent        Comments: works        OT Problem List:        OT Treatment/Interventions:      OT Goals(Current goals can be found in the care plan section) Acute Rehab OT Goals Patient Stated Goal: to breathe a little better  OT Frequency:     Barriers to D/C:            Co-evaluation  AM-PAC PT "6 Clicks" Daily Activity     Outcome Measure Help from another person eating meals?: None Help from another person taking care of personal grooming?: A Little Help from another person toileting, which includes using toliet, bedpan, or urinal?: A Little Help from another person bathing (including washing, rinsing, drying)?: A Little Help from another person to put on and taking off regular upper body clothing?: None Help from another person to put on and taking off regular lower body clothing?: None 6 Click Score: 21   End of Session    Activity Tolerance: Patient tolerated treatment well Patient left: in chair;with call bell/phone within reach  OT Visit Diagnosis: Other (comment)(decreased activity tolerance)                Time: 4818-5631 OT Time Calculation (min): 21 min Charges:  OT General Charges $OT Visit: 1 Visit OT Evaluation $OT Eval [name] Complexity: 1 [name] G-Codes:     10/05/2017 Nestor Lewandowsky, OTR/L Pager: [phone]  Werner Lean Haze Boyden 2017/10/05, 1:57 PM

## 2017-09-11 NOTE — Progress Notes (Signed)
[facility] Infusion Coordinator will follow pt with ID team to support home infusion needs if ordered.  If patient discharges after hours, please call [phone].   Larry Sierras 09/11/2017, 8:12 AM

## 2017-09-11 NOTE — Progress Notes (Signed)
    [facility] for Infectious Disease    Date of Admission:  09/07/2017   Total days of antibiotics 5          ID: Lee [name] is a 37 y.o. 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  Medications:  . bictegravir-emtricitabine-tenofovir AF  1 tablet Oral Daily  . guaiFENesin  1,200 mg Oral BID  . heparin injection (subcutaneous)  5,000 Units Subcutaneous Q8H  . ipratropium  0.5 mg Nebulization TID  . levalbuterol  0.63 mg Nebulization TID  . loratadine  10 mg Oral Daily  . mouth rinse  15 mL Mouth Rinse BID  . polyethylene glycol  17 g Oral BID  . senna-docusate  1 tablet Oral BID    Objective: Vital signs in last 24 hours: Temp:  [97.6 F (36.4 C)-99.5 F (37.5 C)] 97.6 F (36.4 C) (02/19 1150) Pulse Rate:  [63-95] 76 (02/19 1150) Resp:  [15-25] 18 (02/19 1150) BP: (115-143)/(66-92) 141/83 (02/19 1150) SpO2:  [97 %-100 %] 99 % (02/19 1150)   Did not examine  Lab Results Recent Labs    09/10/17 0355 09/11/17 0418  WBC 9.5 7.5  HGB 12.6* 12.3*  HCT 35.9* 36.0*  NA 138 137  K 3.9 3.8  CL 112* 109  CO2 19* 19*  BUN 16 15  CREATININE 1.55* 1.47*   Liver Panel Recent Labs    09/10/17 0355 09/11/17 0418  PROT 7.2 7.2  ALBUMIN 2.4* 2.4*  AST 13* 14*  ALT 11* 11*  ALKPHOS 65 60  BILITOT 0.6 0.7    Microbiology: Strep pneumo ur antigen + Studies/Results: Dg Chest 2 View  Result Date: 09/10/2017 CLINICAL DATA:  Pneumonia EXAM: CHEST  2 VIEW COMPARISON:  09/08/2017 FINDINGS: Interval improvement in the right lower lobe airspace opacity. Continued left lower lobe pneumonia. Small left effusion. Heart is borderline in size. No acute bony abnormality. IMPRESSION: Improving opacity at the right lung base.  Continued left lower lobe pneumonia and minimal infiltrate at the right base. Small left effusion. Electronically Signed   By: Rolm Baptise M.D.   On: 09/10/2017 11:39   Dg Abd 1 View  Result Date: 09/11/2017 CLINICAL DATA:  Abdominal pain.  Fevers. EXAM: ABDOMEN - 1 VIEW COMPARISON:  CT 12/13/2009. FINDINGS: Soft tissue structures are unremarkable. No bowel distention. Stool noted throughout the colon. No free air. Mild left base infiltrate. No acute bony abnormality. IMPRESSION: 1.  Mild left base infiltrate. 2.  No acute intra-abdominal abnormality. Electronically Signed   By: Marcello Moores  Register   On: 09/11/2017 12:06     Assessment/Plan: Strep pneumo - pneumonia = plan to change to cefuroxime 500mg  bid x 4 days. Can stop iv abtx  ckd 2= acute on chronic ckd is now resolved  hiv disease =continue on biktarvy.  [facility] for Infectious Diseases Cell: [phone] Pager: [phone]  09/11/2017, 3:25 PM

## 2017-09-11 NOTE — Progress Notes (Signed)
SATURATION QUALIFICATIONS: (This note is used to comply with regulatory documentation for home oxygen)  Patient Saturations on Room Air at Rest = 96%  Patient Saturations on Room Air while Ambulating = 95%  Patient Saturations on 1 Liters of oxygen while Ambulating = 100%  Please briefly explain why patient needs home oxygen:  Pt did not desat and does not appear to need oxygen while walking

## 2017-09-11 NOTE — Progress Notes (Signed)
Had short visit with patient.  He is feeling much better and hoping to go home soon. Offered encouragement. Conard Novak, Chaplain   09/11/17 1000  Clinical Encounter Type  Visited With Patient  Visit Type Initial;Social support  Referral From Nurse  Consult/Referral To Chaplain  Spiritual Encounters  Spiritual Needs Emotional

## 2017-09-11 NOTE — Evaluation (Signed)
Physical Therapy Evaluation Patient Details Name: Lee [name] MRN: [id number] DOB: 1981/05/05 Today's Date: 09/11/2017   History of Present Illness  37 y.o. male admitted on 09/07/2017 with PNA  Clinical Impression  Orders received for PT evaluation. Patient demonstrates deficits in functional mobility as indicated below. Will benefit from continued skilled PT to address deficits and maximize function. Will see as indicated and progress as tolerated.      Follow Up Recommendations No PT follow up    Equipment Recommendations  None recommended by PT    Recommendations for Other Services       Precautions / Restrictions        Mobility  Bed Mobility               General bed mobility comments: received in chair  Transfers Overall transfer level: Modified independent               General transfer comment: increased time to elevate to standing, reliance on bed rail to push up to standing  Ambulation/Gait Ambulation/Gait assistance: Min guard Ambulation Distance (Feet): 210 Feet Assistive device: (pushing IV pole) Gait Pattern/deviations: Step-through pattern Gait velocity: decreased Gait velocity interpretation: Below normal speed for age/gender General Gait Details: patient with slow gait speed and reliance on UE support for stability due to generalized fatigue   Stairs            Wheelchair Mobility    Modified Rankin (Stroke Patients Only)       Balance                                             Pertinent Vitals/Pain Pain Assessment: No/denies pain    Home Living Family/patient expects to be discharged to:: Private residence Living Arrangements: Alone Available Help at Discharge: Family Type of Home: House Home Access: Level entry     Home Layout: One level Home Equipment: None      Prior Function Level of Independence: Independent               Hand Dominance   Dominant Hand: Right     Extremity/Trunk Assessment   Upper Extremity Assessment Upper Extremity Assessment: Overall WFL for tasks assessed    Lower Extremity Assessment Lower Extremity Assessment: Overall WFL for tasks assessed       Communication   Communication: No difficulties  Cognition Arousal/Alertness: Awake/alert Behavior During Therapy: WFL for tasks assessed/performed Overall Cognitive Status: Within Functional Limits for tasks assessed                                        General Comments      Exercises     Assessment/Plan    PT Assessment Patient needs continued PT services  PT Problem List Decreased balance;Decreased activity tolerance;Decreased mobility       PT Treatment Interventions DME instruction;Gait training;Functional mobility training;Therapeutic activities;Therapeutic exercise;Patient/family education    PT Goals (Current goals can be found in the Care Plan section)  Acute Rehab PT Goals Patient Stated Goal: to breathe a little better PT Goal Formulation: With patient Time For Goal Achievement: 09/25/17 Potential to Achieve Goals: Good    Frequency Min 3X/week   Barriers to discharge        Co-evaluation  AM-PAC PT "6 Clicks" Daily Activity  Outcome Measure Difficulty turning over in bed (including adjusting bedclothes, sheets and blankets)?: A Little Difficulty moving from lying on back to sitting on the side of the bed? : A Little Difficulty sitting down on and standing up from a chair with arms (e.g., wheelchair, bedside commode, etc,.)?: A Little Help needed moving to and from a bed to chair (including a wheelchair)?: A Little Help needed walking in hospital room?: A Little Help needed climbing 3-5 steps with a railing? : A Little 6 Click Score: 18    End of Session   Activity Tolerance: Patient tolerated treatment well;Patient limited by fatigue Patient left: in chair;with call bell/phone within reach;with  family/visitor present Nurse Communication: Mobility status PT Visit Diagnosis: Difficulty in walking, not elsewhere classified (R26.2)    Time: 1211-1227 PT Time Calculation (min) (ACUTE ONLY): 16 min   Charges:   PT Evaluation $PT Eval Moderate Complexity: 1 Mod     PT G Codes:        Lee [name], PT DPT  Board Certified Neurologic Specialist [phone]   Duncan Dull 09/11/2017, 12:33 PM

## 2017-09-11 NOTE — Progress Notes (Signed)
Pharmacy Antibiotic Note  Lee [name] is a 37 y.o. male admitted on 09/07/2017 with PNA.  D#4 abx for PNA. Strep pneumo antigen positive. Blood cx contaminant. CXR BL opacities. Neg flu screen at urgent care. Afebrile, WBC down to wnl. WBC down 13.7. PCT 30. Also with hx HIV, [name] PTA resumed on admit. Last HIV labs 07/26/17, CD4 570, VL < 20.  Plan: Stop vancomycin Start ceftriaxone 1g IV Q24h for 4 more days Monitor clinical picture  Rx will sign off  Height: 6\' 4"  (193 cm) Weight: 281 lb 15.5 oz (127.9 kg) IBW/kg (Calculated) : 86.8  Temp (24hrs), Avg:98.5 F (36.9 C), Min:98 F (36.7 C), Max:99.5 F (37.5 C)  Recent Labs  Lab 09/07/17 1544 09/07/17 1600 09/07/17 1740 09/07/17 2222 09/08/17 0057 09/08/17 1814 09/09/17 0520 09/10/17 0355 09/11/17 0418  WBC 17.8*  --   --   --  17.1*  --  13.7* 9.5 7.5  CREATININE 3.41*  --   --   --  2.37*  --  1.61* 1.55* 1.47*  LATICACIDVEN  --  3.68* 3.65* 2.9* 2.2*  --   --   --   --   VANCORANDOM  --   --   --   --   --  5  --   --   --     Estimated Creatinine Clearance: 103.4 mL/min (A) (by C-G formula based on SCr of 1.47 mg/dL (H)).    Allergies  Allergen Reactions  . Lisinopril Other (See Comments)    Dry cough    . Nickel Rash    Thank you for allowing pharmacy to be a part of this patient's care.  Elenor Quinones, PharmD, BCPS Clinical Pharmacist Pager [phone] 09/11/2017 8:39 AM

## 2017-09-11 NOTE — Progress Notes (Signed)
PROGRESS NOTE    Lee [name]  YYT:[id number] DOB: 1981-02-09 DOA: 09/07/2017 PCP: [name], NP   Brief Narrative:  Lee [name] is a 37 y.o. male with medical history significant of HIV, HTN, syphilis treated and other comorbids who presented with a cough for the past 5 days. Reports having fevers up to 103.3 and chills with diaphoresis. Cough is productive of yellow and thick sputum. Had bleeding from his nose and possibly blood tinged sputum. He presented to urgent care was started on Tamiflu but later on influenza screen came back negative. He had decreased oral intake reports feeling dehydrated and exhausted. Reports taking all of his HIV medications. Last CD4 >400 in January. Was found to be Septic and CXR revealed Bilateral PNA. Sepsis protocol initiated and Started on IV Abx. Case was Discussed with ID who did not feel as if it was PCP Pneumonia. Patient admitted to SDU and is currently being treated. Patient found to be septic from Pneumonia and Metapneumovirus Infection as Bacteria in blood was a contaminant .   Today patient's Abdominal Pain was slightly worse and he stated he was coughing up some mucous with blood in it. He improved however and was stable to transfer to Telemetry.   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[place])   CAP (community acquired pneumonia)   Pneumonia due to human metapneumovirus (hMPV)  Severe Sepsis 2/2 to CAP in the setting of Metapneumovirus, improving -Admitted to SDU and stable to transfer to Telemetry today  -Sepsis Physiology improved -IV Abx changed by Infectious Diseases to IV Vancomycin  -S/p 3 Liters of IVF boluses; Decrease NS from 125 mL/hr to 75 mL/hr -Added Breathing Treatments with Xopenex/Atrovent -C/w Guaifenesin -Blood Cx's on 09/07/17 was Positive for Methicillin Resistance Coag Negative Staphylococcus Species in 1/2 Cx's however 2nd Cx  showed Gram Positive Cocci and was Staph Hominis and is likely a contaminant; Now second Cx showing NGTD at 3 days still -Per ID If Coag Neagtive Staph Cx then it is a contaminant and can focus on treating the patient for Bacterial PNA Superimposed on Metapneumovirus Infection -Repeat Blood Cx 09/08/17 still showing NGTD at 2 days   -Checked Strep Pneumo Urine Ag and was POSITIVE; Also checiking Urine Legionella Ag as well  -ID feels not consistent PCP Pneumonia and recommended changing current Abx Regimen with IV Vancomycin to IV Ceftriaxone while hospitalized and po Doxycycline BID at D/C ; Appreciated ID recc's -Procalcitonin was 29.96 -Lactic Acid Level decreasing from 3.68 -> 3.65 -> 2.9 -> 2.2 -WBC went from 17.8 -> 17.1 -> 13.7 -> 9.5 -> 7.5  -May consider IV Steroids if not improving more  -Checked Gram Stain showed:   ABUNDANT WBC PRESENT, PREDOMINANTLY PMN  ABUNDANT GRAM POSITIVE COCCI  MODERATE GRAM NEGATIVE RODS      Sputum Cx pending and re-incubated for better growth -Repeat CXR yesterday AM showed Improving opacity at the right lung base. Continued left lower lobe pneumonia and minimal infiltrate at the right base. Small left Effusion.  -**Patient now states he has Hemoptysis**; Will monitor very closely  -If worsening will get CT Chest w/o Contrast -ID ordering Transthoracic ECHOCardiogram and done yesterday and showed EF of 55-60% with normal wall motion and normal LVDF parameters  -Repeat CBC in AM  Lactic Acidosis -From Sepsis and Dehydration from poor oral intake  -Improving. Lactic Acid Level decreasing from 3.68 -> 3.65 -> 2.9 -> 2.2 -  C/w IVF Rehydration but at a decreased rate at 75 mL/hr  AKI on CKD Stage 3 -Baseline Cr raging from 1.1-1.5 -BUN/Cr on Admission was 33/3.41 and now improved to 15/1.47 -C/w IVF Rehydration at reduced rate of NS at 75 mL/hr -Avoid Nephrotoxic Medications as possible -Repeat CMP in AM   HIV -Patient states he is compliant with  Medications -Last CD4 Count 07/26/17 was 570; Viral Load was <20 -This Hospitalization CD4 Count was 480 -C/w Bictegravir-Emtricitabine-Tenofovir 50-200-25 mg 1 tab po Daily  -Discussed with ID and Dr. Tommy Medal and appreciated his Evaluation and Recommendations; Dr. Baxter Flattery to follow   Hyperbilirubinemia -Patient's T Bili went from 2.4 -> 1.5 -> 0.8 -> 0.6 -> 0.7  -Continue to Monitor and Repeat CMP in AM  Hyponatremia, improving -Patient's Na+ was 131 and improved to 137  -Continue IVF Rehydration as above and Repeat CMP in AM  Hyperglycemia -Maybe Reactive in the Setting of Sepsis and Infection -Checked HbA1c and was 5.1  -Continue to Monitor BS and if >200 start Sensitive Novolog SSI  Thrombocytopenia -Patient's Platelet Count went from 161 -> 143 -> 145 -> 158 -> 175  -Likely in the Setting of Infection -Continue to Monitor for S/Sx of Bleeding -Repeat CBC in AM  Normocytic Anemia -Patient's Hb/Hct went from 15.4/43.2 -> 13.6/38.9 -> 12.4/35.5 -> 12.6/35.9 0 -> 12.3/36.0  -Likely Dilutional Drop from IVF Rehydration -Continue to monitor for S/Sx of Bleeding very closey as patient stating he now has Hemoptysis -Repeat CBC in AM   Hypomagnesemia -Patient's Mag Level was 1.8 this AM -Replete with IV 2 grams of Mag Sulfate -Continue to Monitor and Replete As Necessary -Repeat Mag Level in AM   Mid Epigastric Abdominal Pain -? Related to Constipation -Obtained KUB and showed Soft tissue structures are unremarkable. No bowel distention. Stool noted throughout the colon. No free air. Mild left base infiltrate. No acute bony abnormality. -Start Bowel Regimen with Senna-Docusate 1 tab po BID, Miralax 17 grams po BID, and added Bisacodyl 10 mg RC Daily PRN -Continue to Monitor   DVT prophylaxis: Heparin 5,000 units sq q8h Code Status: FULL CODE Family Communication: No family present at bedside  Disposition Plan: Transfer to Telemetry; PT Evaluated and recommending no Follow up     Consultants:   Infectious Diseases Dr. Dietrich Pates [facility] was consulted by EDP who felt patient did not meet ICU Criteria.    Procedures:  ECHOCARDIOGRAM ------------------------------------------------------------------- Study Conclusions  - Left ventricle: The cavity size was normal. Systolic function was   normal. The estimated ejection fraction was in the range of 55%   to 60%. Wall motion was normal; there were no regional wall   motion abnormalities. Left ventricular diastolic function   parameters were normal. - Aortic valve: There was no regurgitation. - Aortic root: The aortic root was normal in size. - Mitral valve: There was mild regurgitation. - Left atrium: The atrium was normal in size. - Right ventricle: The cavity size was normal. Wall thickness was   normal. Systolic function was normal. - Right atrium: The atrium was normal in size. - Tricuspid valve: There was mild regurgitation. - Pulmonary arteries: Systolic pressure was within the normal   range. - Inferior vena cava: The vessel was normal in size. - Pericardium, extracardiac: There was no pericardial effusion.  Impressions:  - There was no evidence of a vegetation.   Antimicrobials:  Anti-infectives (From admission, onward)   Start     Dose/Rate Route Frequency Ordered Stop  09/11/17 1000  cefTRIAXone (ROCEPHIN) 1 g in sodium chloride 0.9 % 100 mL IVPB     1 g 200 mL/hr over 30 Minutes Intravenous Every 24 hours 09/11/17 0837 09/15/17 0959   09/09/17 1600  vancomycin (VANCOCIN) IVPB 1000 mg/200 mL premix  Status:  Discontinued     1,000 mg 200 mL/hr over 60 Minutes Intravenous Every 12 hours 09/09/17 0735 09/11/17 0828   09/08/17 2200  vancomycin (VANCOCIN) 1,500 mg in sodium chloride 0.9 % 500 mL IVPB  Status:  Discontinued     1,500 mg 250 mL/hr over 120 Minutes Intravenous Every 24 hours 09/08/17 2058 09/09/17 0735   09/08/17 1800  ceFEPIme (MAXIPIME) 2 g in sodium chloride 0.9 % 100  mL IVPB  Status:  Discontinued     2 g 200 mL/hr over 30 Minutes Intravenous Every 24 hours 09/07/17 1637 09/07/17 1648   09/08/17 1800  cefTRIAXone (ROCEPHIN) 2 g in sodium chloride 0.9 % 100 mL IVPB  Status:  Discontinued     2 g 200 mL/hr over 30 Minutes Intravenous Every 24 hours 09/07/17 1648 09/08/17 1756   09/08/17 1000  bictegravir-emtricitabine-tenofovir AF ([name]) 50-200-25 MG per tablet 1 tablet     1 tablet Oral Daily 09/07/17 2200     09/07/17 2300  azithromycin (ZITHROMAX) 500 mg in sodium chloride 0.9 % 250 mL IVPB  Status:  Discontinued     500 mg 250 mL/hr over 60 Minutes Intravenous Daily at bedtime 09/07/17 2200 09/08/17 1756   09/07/17 2215  cefTRIAXone (ROCEPHIN) 1 g in sodium chloride 0.9 % 100 mL IVPB  Status:  Discontinued     1 g 200 mL/hr over 30 Minutes Intravenous Every 24 hours 09/07/17 2200 09/07/17 2203   09/07/17 1928  vancomycin (VANCOCIN) 500 MG powder    Comments:  Peel, Adrienne   : cabinet override      09/07/17 1928 09/08/17 0729   09/07/17 1900  vancomycin (VANCOCIN) 500 mg in sodium chloride 0.9 % 100 mL IVPB     500 mg 100 mL/hr over 60 Minutes Intravenous  Once 09/07/17 1646 09/07/17 2035   09/07/17 1830  vancomycin (VANCOCIN) 500 mg in sodium chloride 0.9 % 100 mL IVPB  Status:  Discontinued     500 mg 100 mL/hr over 60 Minutes Intravenous  Once 09/07/17 1603 09/07/17 1611   09/07/17 1800  vancomycin (VANCOCIN) IVPB 1000 mg/200 mL premix     1,000 mg 200 mL/hr over 60 Minutes Intravenous  Once 09/07/17 1646 09/07/17 1923   09/07/17 1730  vancomycin (VANCOCIN) IVPB 1000 mg/200 mL premix  Status:  Discontinued     1,000 mg 200 mL/hr over 60 Minutes Intravenous  Once 09/07/17 1603 09/07/17 1611   09/07/17 1700  vancomycin (VANCOCIN) IVPB 1000 mg/200 mL premix     1,000 mg 200 mL/hr over 60 Minutes Intravenous  Once 09/07/17 1646 09/07/17 1758   09/07/17 1630  vancomycin (VANCOCIN) IVPB 1000 mg/200 mL premix  Status:  Discontinued     1,000  mg 200 mL/hr over 60 Minutes Intravenous  Once 09/07/17 1603 09/07/17 1611   09/07/17 1605  ceFEPIme (MAXIPIME) 2 g injection    Comments:  Peel, Adrienne   : cabinet override      09/07/17 1605 09/08/17 0414   09/07/17 1545  vancomycin (VANCOCIN) IVPB 1000 mg/200 mL premix  Status:  Discontinued     1,000 mg 200 mL/hr over 60 Minutes Intravenous  Once 09/07/17 1541 09/07/17 1542   09/07/17 1545  piperacillin-tazobactam (ZOSYN) IVPB 3.375 g  Status:  Discontinued     3.375 g 100 mL/hr over 30 Minutes Intravenous  Once 09/07/17 1541 09/07/17 1542   09/07/17 1545  ceFEPIme (MAXIPIME) 2 g in sodium chloride 0.9 % 100 mL IVPB     2 g 200 mL/hr over 30 Minutes Intravenous  Once 09/07/17 1542 09/07/17 1646   09/07/17 1545  vancomycin (VANCOCIN) IVPB 1000 mg/200 mL premix  Status:  Discontinued     1,000 mg 200 mL/hr over 60 Minutes Intravenous  Once 09/07/17 1542 09/07/17 1602     Subjective: Seen and examined this AM and stated breathing was better but now states that he is coughing up some blood tinged sputum. Also complaining of some abdominal pain now and states he has not had a bowel movement since Saturday.   Objective: Vitals:   09/10/17 2300 09/11/17 0300 09/11/17 0700 09/11/17 0802  BP: 132/77 115/70    Pulse: 81 63    Resp: (!) 24 17    Temp: 98.3 F (36.8 C) 98 F (36.7 C) 99.5 F (37.5 C)   TempSrc: Oral Oral Oral   SpO2: 99% 99%  97%  Weight:      Height:        Intake/Output Summary (Last 24 hours) at 09/11/2017 0858 Last data filed at 09/11/2017 0600 Gross per 24 hour  Intake 2737.08 ml  Output 2350 ml  Net 387.08 ml   Filed Weights   09/07/17 1531 09/07/17 2104  Weight: 126.6 kg (279 lb) 127.9 kg (281 lb 15.5 oz)   Examination: Physical Exam:  Constitutional: WN/WD AAM in NAD appears calm.  Eyes: Sclerae anicteric. Lids normal ENMT: External Eaers and nose appear normal. Grossl Neck: Supple with No JVD Respiratory: Diminished to auscultation; Not  wearing any Supplemental O2. Had diminished breath sounds and mild rhonchi in Left Lower Lobe.  Cardiovascular: RRR; No appreciable m/r/g.No LE Edema Abdomen: Soft, Tender to palpate. ND GU: Deferred Musculoskeletal: No contractures; No cyanosis Skin: Warm and Dry; No appreciable rashes or lesions on a limited skin eval Neurologic: CN 2-12 grossly intact. No appreciable focal deficits Psychiatric: Normal mood and affect. Intact judgement and insight  Data Reviewed: I have personally reviewed following labs and imaging studies  CBC: Recent Labs  Lab 09/07/17 1544 09/08/17 0057 09/09/17 0520 09/10/17 0355 09/11/17 0418  WBC 17.8* 17.1* 13.7* 9.5 7.5  NEUTROABS 15.7* 15.1* 11.3* 6.9 4.9  HGB 15.4 13.6 12.4* 12.6* 12.3*  HCT 43.2 38.9* 35.5* 35.9* 36.0*  MCV 87.4 88.6 88.8 90.0 88.9  PLT 161 143* 145* 158 989   Basic Metabolic Panel: Recent Labs  Lab 09/07/17 1544 09/08/17 0057 09/09/17 0520 09/10/17 0355 09/11/17 0418  NA 131* 134* 138 138 137  K 4.2 4.2 3.8 3.9 3.8  CL 103 108 110 112* 109  CO2 17* 17* 18* 19* 19*  GLUCOSE 103* 115* 97 85 89  BUN 33* 28* 18 16 15   CREATININE 3.41* 2.37* 1.61* 1.55* 1.47*  CALCIUM 8.0* 7.5* 7.8* 7.9* 8.0*  MG  --   --  1.7 1.6* 1.8  PHOS  --   --  2.6 2.9 3.3   GFR: Estimated Creatinine Clearance: 103.4 mL/min (A) (by C-G formula based on SCr of 1.47 mg/dL (H)). Liver Function Tests: Recent Labs  Lab 09/07/17 1544 09/08/17 0057 09/09/17 0520 09/10/17 0355 09/11/17 0418  AST 37 26 15 13* 14*  ALT 19 16* 12* 11* 11*  ALKPHOS 71 64 63 65 60  BILITOT  2.4* 1.5* 0.8 0.6 0.7  PROT 8.5* 7.0 6.9 7.2 7.2  ALBUMIN 3.2* 2.5* 2.4* 2.4* 2.4*   No results for input(s): LIPASE, AMYLASE in the last 168 hours. No results for input(s): AMMONIA in the last 168 hours. Coagulation Profile: Recent Labs  Lab 09/07/17 2222  INR 1.55   Cardiac Enzymes: Recent Labs  Lab 09/07/17 1544  TROPONINI <0.03   BNP (last 3 results) No results  for input(s): PROBNP in the last 8760 hours. HbA1C: Recent Labs    09/10/17 0355  HGBA1C 5.1   CBG: Recent Labs  Lab 09/07/17 1542  GLUCAP 92   Lipid Profile: No results for input(s): CHOL, HDL, LDLCALC, TRIG, CHOLHDL, LDLDIRECT in the last 72 hours. Thyroid Function Tests: No results for input(s): TSH, T4TOTAL, FREET4, T3FREE, THYROIDAB in the last 72 hours. Anemia Panel: No results for input(s): VITAMINB12, FOLATE, FERRITIN, TIBC, IRON, RETICCTPCT in the last 72 hours. Sepsis Labs: Recent Labs  Lab 09/07/17 1600 09/07/17 1740 09/07/17 2222 09/08/17 0057  PROCALCITON  --   --  29.96  --   LATICACIDVEN 3.68* 3.65* 2.9* 2.2*    Recent Results (from the past 240 hour(s))  Blood Culture (routine x 2)     Status: Abnormal   Collection Time: 09/07/17  3:40 PM  Result Value Ref Range Status   Specimen Description   Final    BLOOD LEFT ARM Performed at [facility], [address] [place], [place] [address]    Special Requests   Final    BOTTLES DRAWN AEROBIC AND ANAEROBIC Blood Culture adequate volume Performed at [facility], [address] [place], [place] [address]    Culture  Setup Time   Final    GRAM POSITIVE COCCI IN BOTH AEROBIC AND ANAEROBIC BOTTLES CRITICAL RESULT CALLED TO, READ BACK BY AND VERIFIED WITH: M MACCIA,PHARMD AT 0175 09/08/17 BY L BENFIELD    Culture (A)  Final    STAPHYLOCOCCUS SPECIES (COAGULASE NEGATIVE) THE SIGNIFICANCE OF ISOLATING THIS ORGANISM FROM A SINGLE SET OF BLOOD CULTURES WHEN MULTIPLE SETS ARE DRAWN IS UNCERTAIN. PLEASE NOTIFY THE MICROBIOLOGY DEPARTMENT WITHIN ONE WEEK IF SPECIATION AND SENSITIVITIES ARE REQUIRED. Performed at [facility], [place] [address] [place], [place] [address]    Report Status 09/10/2017 FINAL  Final  Blood Culture (routine x 2)     Status: None (Preliminary result)   Collection Time: 09/07/17  3:40 PM  Result Value Ref Range Status   Specimen Description   Final    BLOOD  RIGHT HAND Performed at [facility], [address] [place], [place] [address]    Special Requests   Final    BOTTLES DRAWN AEROBIC AND ANAEROBIC Blood Culture adequate volume Performed at [facility], [address] [place], [place] [address]    Culture   Final    NO GROWTH 3 DAYS Performed at [facility], [place] [address] [place], [place] [address]    Report Status PENDING  Incomplete  Blood Culture ID Panel (Reflexed)     Status: Abnormal   Collection Time: 09/07/17  3:40 PM  Result Value Ref Range Status   Enterococcus species NOT DETECTED NOT DETECTED Final   Listeria monocytogenes NOT DETECTED NOT DETECTED Final   Staphylococcus species DETECTED (A) NOT DETECTED Final    Comment: Methicillin (oxacillin) resistant coagulase negative staphylococcus. Possible blood culture contaminant (unless isolated from more than one blood culture draw or  clinical case suggests pathogenicity). No antibiotic treatment is indicated for blood  culture contaminants. CRITICAL RESULT CALLED TO, READ BACK BY AND VERIFIED WITH: M MACCIA,PHARMD AT 1618 09/08/17 BY L BENFIELD    Staphylococcus aureus NOT DETECTED NOT DETECTED Final   Methicillin resistance DETECTED (A) NOT DETECTED Final    Comment: CRITICAL RESULT CALLED TO, READ BACK BY AND VERIFIED WITH: M MACCIA,PHARMD AT 1618 09/08/17 BY L BENFIELD    Streptococcus species NOT DETECTED NOT DETECTED Final   Streptococcus agalactiae NOT DETECTED NOT DETECTED Final   Streptococcus pneumoniae NOT DETECTED NOT DETECTED Final   Streptococcus pyogenes NOT DETECTED NOT DETECTED Final   Acinetobacter baumannii NOT DETECTED NOT DETECTED Final   Enterobacteriaceae species NOT DETECTED NOT DETECTED Final   Enterobacter cloacae complex NOT DETECTED NOT DETECTED Final   Escherichia coli NOT DETECTED NOT DETECTED Final   Klebsiella oxytoca NOT DETECTED NOT DETECTED Final   Klebsiella pneumoniae NOT DETECTED NOT DETECTED Final    Proteus species NOT DETECTED NOT DETECTED Final   Serratia marcescens NOT DETECTED NOT DETECTED Final   Haemophilus influenzae NOT DETECTED NOT DETECTED Final   Neisseria meningitidis NOT DETECTED NOT DETECTED Final   Pseudomonas aeruginosa NOT DETECTED NOT DETECTED Final   Candida albicans NOT DETECTED NOT DETECTED Final   Candida glabrata NOT DETECTED NOT DETECTED Final   Candida krusei NOT DETECTED NOT DETECTED Final   Candida parapsilosis NOT DETECTED NOT DETECTED Final   Candida tropicalis NOT DETECTED NOT DETECTED Final    Comment: Performed at [facility], [address] [place], [place] [address]  Culture, expectorated sputum-assessment     Status: None   Collection Time: 09/07/17  4:20 PM  Result Value Ref Range Status   Specimen Description SPUTUM  Final   Special Requests   Final    Immunocompromised Performed at [facility], [address] [place], [place] [address]    Sputum evaluation THIS SPECIMEN IS ACCEPTABLE FOR SPUTUM CULTURE  Final   Report Status 09/08/2017 FINAL  Final  Culture, respiratory (NON-Expectorated)     Status: None   Collection Time: 09/07/17  4:20 PM  Result Value Ref Range Status   Specimen Description SPUTUM  Final   Special Requests Immunocompromised Reflexed from [id number]  Final   Gram Stain   Final    ABUNDANT WBC PRESENT, PREDOMINANTLY PMN ABUNDANT GRAM POSITIVE COCCI MODERATE GRAM NEGATIVE RODS    Culture   Final    Consistent with normal respiratory flora. Performed at [facility], [place] [address], [place], [place] [address]    Report Status 09/10/2017 FINAL  Final  Respiratory Panel by PCR     Status: Abnormal   Collection Time: 09/07/17  6:00 PM  Result Value Ref Range Status   Adenovirus NOT DETECTED NOT DETECTED Final   Coronavirus 229E NOT DETECTED NOT DETECTED Final   Coronavirus HKU1 NOT DETECTED NOT DETECTED Final   Coronavirus NL63 NOT DETECTED NOT DETECTED Final   Coronavirus OC43 NOT DETECTED NOT  DETECTED Final   Metapneumovirus DETECTED (A) NOT DETECTED Final   Rhinovirus / Enterovirus NOT DETECTED NOT DETECTED Final   Influenza A NOT DETECTED NOT DETECTED Final   Influenza B NOT DETECTED NOT DETECTED Final   Parainfluenza Virus 1 NOT DETECTED NOT DETECTED Final   Parainfluenza Virus 2 NOT DETECTED NOT DETECTED Final   Parainfluenza Virus 3 NOT DETECTED NOT DETECTED Final   Parainfluenza Virus 4 NOT DETECTED NOT DETECTED Final  Respiratory Syncytial Virus NOT DETECTED NOT DETECTED Final   Bordetella pertussis NOT DETECTED NOT DETECTED Final   Chlamydophila pneumoniae NOT DETECTED NOT DETECTED Final   Mycoplasma pneumoniae NOT DETECTED NOT DETECTED Final    Comment: Performed at [facility], [place] [address], [place], [place] [address]  MRSA PCR Screening     Status: None   Collection Time: 09/07/17  9:04 PM  Result Value Ref Range Status   MRSA by PCR NEGATIVE NEGATIVE Final    Comment:        The GeneXpert MRSA Assay (FDA approved for NASAL specimens only), is one component of a comprehensive MRSA colonization surveillance program. It is not intended to diagnose MRSA infection nor to guide or monitor treatment for MRSA infections. Performed at [facility], [place] [address] [place], [place] [address]   Culture, blood (Routine X 2) w Reflex to ID Panel     Status: None (Preliminary result)   Collection Time: 09/08/17  6:12 PM  Result Value Ref Range Status   Specimen Description BLOOD RIGHT ANTECUBITAL  Final   Special Requests   Final    BOTTLES DRAWN AEROBIC AND ANAEROBIC Blood Culture adequate volume   Culture   Final    NO GROWTH 2 DAYS Performed at [facility], [place] [address], [place], [place] [address]    Report Status PENDING  Incomplete  Culture, blood (Routine X 2) w Reflex to ID Panel     Status: None (Preliminary result)   Collection Time: 09/08/17  6:14 PM  Result Value Ref Range Status   Specimen Description BLOOD LEFT HAND  Final    Special Requests   Final    BOTTLES DRAWN AEROBIC ONLY Blood Culture adequate volume   Culture   Final    NO GROWTH 2 DAYS Performed at [facility], [place] [address], [place], [place] [address]    Report Status PENDING  Incomplete    Radiology Studies: Dg Chest 2 View  Result Date: 09/10/2017 CLINICAL DATA:  Pneumonia EXAM: CHEST  2 VIEW COMPARISON:  09/08/2017 FINDINGS: Interval improvement in the right lower lobe airspace opacity. Continued left lower lobe pneumonia. Small left effusion. Heart is borderline in size. No acute bony abnormality. IMPRESSION: Improving opacity at the right lung base. Continued left lower lobe pneumonia and minimal infiltrate at the right base. Small left effusion. Electronically Signed   By: Rolm Baptise M.D.   On: 09/10/2017 11:39   Scheduled Meds: . bictegravir-emtricitabine-tenofovir AF  1 tablet Oral Daily  . guaiFENesin  1,200 mg Oral BID  . heparin injection (subcutaneous)  5,000 Units Subcutaneous Q8H  . ipratropium  0.5 mg Nebulization TID  . levalbuterol  0.63 mg Nebulization TID  . loratadine  10 mg Oral Daily  . mouth rinse  15 mL Mouth Rinse BID  . polyethylene glycol  17 g Oral BID  . senna-docusate  1 tablet Oral BID   Continuous Infusions: . sodium chloride 75 mL/hr at 09/10/17 2121  . cefTRIAXone (ROCEPHIN)  IV    . magnesium sulfate 1 - 4 g bolus IVPB      LOS: 4 days   Kerney Elbe, DO [facility] Pager [phone]  If 7PM-7AM, please contact night-coverage [email or web address] Password [facility] 09/11/2017, 8:58 AM

## 2017-09-11 NOTE — Care Management Note (Signed)
Case Management Note  Patient Details  Name: Lee [name] MRN: [id number] Date of Birth: 04-17-1981  Subjective/Objective:  From home alone, presents with Sepsis, Pna due to metapneumovirus (hmpv), 042, CAP.  He has PCP and medication coverage.                Action/Plan: NCM will follow for dc needs.   Expected Discharge Date:                  Expected Discharge Plan:  Home/Self Care  In-House Referral:     Discharge planning Services  CM Consult  Post Acute Care Choice:    Choice offered to:     DME Arranged:    DME Agency:     HH Arranged:    HH Agency:     Status of Service:  In process, will continue to follow  If discussed at Long Length of Stay Meetings, dates discussed:    Additional Comments:  Zenon Mayo, RN 09/11/2017, 12:30 PM

## 2017-09-12 DIAGNOSIS — A419 Sepsis, unspecified organism: Secondary | ICD-10-CM

## 2017-09-12 DIAGNOSIS — R0602 Shortness of breath: Secondary | ICD-10-CM

## 2017-09-12 DIAGNOSIS — B20 Human immunodeficiency virus [HIV] disease: Principal | ICD-10-CM

## 2017-09-12 DIAGNOSIS — A403 Sepsis due to Streptococcus pneumoniae: Secondary | ICD-10-CM

## 2017-09-12 DIAGNOSIS — R6520 Severe sepsis without septic shock: Secondary | ICD-10-CM

## 2017-09-12 DIAGNOSIS — I10 Essential (primary) hypertension: Secondary | ICD-10-CM

## 2017-09-12 DIAGNOSIS — E872 Acidosis: Secondary | ICD-10-CM

## 2017-09-12 LAB — COMPREHENSIVE METABOLIC PANEL
ALT: 11 U/L — ABNORMAL LOW (ref 17–63)
AST: 13 U/L — ABNORMAL LOW (ref 15–41)
Albumin: 2.5 g/dL — ABNORMAL LOW (ref 3.5–5.0)
Alkaline Phosphatase: 68 U/L (ref 38–126)
Anion gap: 9 (ref 5–15)
BUN: 14 mg/dL (ref 6–20)
CO2: 20 mmol/L — ABNORMAL LOW (ref 22–32)
Calcium: 8.4 mg/dL — ABNORMAL LOW (ref 8.9–10.3)
Chloride: 108 mmol/L (ref 101–111)
Creatinine, Ser: 1.48 mg/dL — ABNORMAL HIGH (ref 0.61–1.24)
GFR calc Af Amer: >60 mL/min (ref >60)
GFR calc non Af Amer: >60 mL/min (ref >60)
Glucose, Bld: 89 mg/dL (ref 65–99)
Potassium: 3.8 mmol/L (ref 3.5–5.1)
Sodium: 137 mmol/L (ref 135–145)
Total Bilirubin: 0.7 mg/dL (ref 0.3–1.2)
Total Protein: 7.8 g/dL (ref 6.5–8.1)

## 2017-09-12 LAB — CBC WITH DIFFERENTIAL/PLATELET
Basophils Absolute: 0 10*3/uL (ref 0.0–0.1)
Basophils Relative: 0 %
Eosinophils Absolute: 0.3 10*3/uL (ref 0.0–0.7)
Eosinophils Relative: 3 %
HCT: 37.7 % — ABNORMAL LOW (ref 39.0–52.0)
Hemoglobin: 13.3 g/dL (ref 13.0–17.0)
Lymphocytes Relative: 18 %
Lymphs Abs: 1.6 10*3/uL (ref 0.7–4.0)
MCH: 31.1 pg (ref 26.0–34.0)
MCHC: 35.3 g/dL (ref 30.0–36.0)
MCV: 88.1 fL (ref 78.0–100.0)
Monocytes Absolute: 1.1 10*3/uL — ABNORMAL HIGH (ref 0.1–1.0)
Monocytes Relative: 12 %
Neutro Abs: 5.8 10*3/uL (ref 1.7–7.7)
Neutrophils Relative %: 67 %
Platelets: 216 10*3/uL (ref 150–400)
RBC: 4.28 MIL/uL (ref 4.22–5.81)
RDW: 13.2 % (ref 11.5–15.5)
WBC: 8.8 10*3/uL (ref 4.0–10.5)

## 2017-09-12 LAB — CULTURE, BLOOD (ROUTINE X 2)
Culture: NO GROWTH
Special Requests: ADEQUATE

## 2017-09-12 LAB — PHOSPHORUS: Phosphorus: 3.5 mg/dL (ref 2.5–4.6)

## 2017-09-12 LAB — MAGNESIUM: Magnesium: 1.8 mg/dL (ref 1.7–2.4)

## 2017-09-12 MED ORDER — CEFUROXIME AXETIL 500 MG PO TABS: 500.0000 mg | ORAL_TABLET | Freq: Two times a day (BID) | ORAL | 0 refills | Status: DC

## 2017-09-12 MED ORDER — LEVALBUTEROL HCL 0.63 MG/3ML IN NEBU: 0.6300 mg | INHALATION_SOLUTION | Freq: Four times a day (QID) | RESPIRATORY_TRACT | Status: DC | PRN

## 2017-09-12 MED ORDER — LEVALBUTEROL HCL 0.63 MG/3ML IN NEBU: 0.6300 mg | INHALATION_SOLUTION | Freq: Four times a day (QID) | RESPIRATORY_TRACT | Status: DC

## 2017-09-12 MED ORDER — GUAIFENESIN ER 600 MG PO TB12: 600.0000 mg | ORAL_TABLET | Freq: Two times a day (BID) | ORAL | 0 refills | Status: DC | PRN

## 2017-09-12 MED ORDER — BENZONATATE 100 MG PO CAPS: 100.0000 mg | ORAL_CAPSULE | Freq: Three times a day (TID) | ORAL | 0 refills | Status: DC | PRN

## 2017-09-12 MED ORDER — SALINE SPRAY 0.65 % NA SOLN: 1.0000 | NASAL | 0 refills | Status: DC | PRN

## 2017-09-12 MED ORDER — IPRATROPIUM BROMIDE 0.02 % IN SOLN: 0.5000 mg | Freq: Four times a day (QID) | RESPIRATORY_TRACT | Status: DC | PRN

## 2017-09-12 NOTE — Progress Notes (Addendum)
D/c summary, scripts, instructions reviewed w/ pt. Pt states no questions or concerns. This RN noted pharmacy where scripts originally sent is out of town.  Pt provided name of local pharmacy he uses which was relayed to MD. Per MD, scripts sent to [facility] on [address]. Pt provided w/ unit # and instructed to contact unit if any issues getting prescriptions. Pt states understanding. Pt taken downstairs by wheelchair.

## 2017-09-12 NOTE — Progress Notes (Signed)
Pt w/ low grade temp (see vitals flowsheet). Per MD, ok to d/c, will be on PO antibiotics.

## 2017-09-12 NOTE — Discharge Summary (Signed)
Physician Discharge Summary  Lee [name] WNU:[id number] DOB: [birth year] DOA: 09/07/2017  PCP: Lee Peng, NP  Admit date: 09/07/2017 Discharge date: 09/12/2017  Admitted From: home Disposition:  home   Discharge Condition: Stable CODE STATUS: Full code Consultations:  Infectious disease   Discharge Diagnoses:  Principal Problem:   Sepsis   Active Problems:   CAP due to streptococcal pneumonia and human metapneumovirus (hMPV)   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[facility])   Subjective: The patient has no complaints.  Brief Summary: Lee [name] a 37 y.o.malewith medical history significant of HIV, HTN, syphilis (treated), CKD 3 who presented with acough for the past 5 days.Reports having feversup to 103.3and chills with diaphoresis and cough which is productive of yellow andthick sputum.Had bleeding from his nose and possibly blood tinged sputum. He presented to urgent care & was started on Tamiflu but influenza screen was later found to be negative. He had decreased oral intake reports feeling dehydrated and exhausted.Reports taking all of his HIV medications. Last CD4 >400in January.  He was found to be Septic. CXR revealed Bilateral PNA.     Hospital Course:  Severe Sepsis due to Strep pneumo CAP and Metapneumovirus Hemoptysis due to above - resp virus panel + for metapneumovirus - Urine Strep Pneumo Urine Ag positive -Chest x-ray revealed bilateral pulmonary opacities left greater than right -Initially treated with ceftriaxone and vancomycin-vancomycin subsequently discontinued -Procalcitonin was 29.96 -Blood Cx's on 09/07/17 was Positive for Methicillin Resistance Coag Negative Staphylococcus Species in 1/2 Cx's however 2nd Cx showed Gram Positive Cocci and was Staph Hominis which is likely a contaminant - repeat Culture showing NGTD   -WBC noted to improve from 17.8 -> 17.1 -> 13.7 -> 9.5 -> 7.5  - ID consult requested and recommended on  2/19 to treat with Cefuroxime 500 BID x 4 more days -He is quite stable on room air at this time  Lactic Acidosis -From Sepsis and Dehydration  - Lactic Acid Level has improved with treatment as above   AKI on CKD Stage 3 -Baseline Cr raging from 1.1-1.5 -BUN/Cr on Admission was 33/3.41 and now improved to 14/1.48 with IV hydration   HIV -Patient states he is compliant with Medications -Last CD4 Count 07/26/17 was 570; Viral Load was <20 -This Hospitalization CD4 Count was 480 -C/w Bictegravir-Emtricitabine-Tenofovir 50-200-25 mg 1 tab po Daily   Hypertension -Stable on current medications  Hyperbilirubinemia - T Biliwas 2.4  On admission and has improved to 0.7   Hyponatremia due to dehydration -Patient's Na+ was 131 and improved to 137 with IV fluids   Thrombocytopenia -Patient's Platelet Count dropped to 143 but has improved to 175 - drop may have been related to sepsis  Hypomagnesemia - repleated     Discharge Exam: Vitals:   09/12/17 0300 09/12/17 0859  BP: 133/80 125/67  Pulse:  90  Resp: 16 18  Temp: 97.8 F (36.6 C) 98.8 F (37.1 C)  SpO2:  97%   Vitals:   09/11/17 1937 09/12/17 0004 09/12/17 0300 09/12/17 0859  BP:  137/85 133/80 125/67  Pulse: 83 89  90  Resp: 18 16 16 18   Temp: 98.8 F (37.1 C) 98.9 F (37.2 C) 97.8 F (36.6 C) 98.8 F (37.1 C)  TempSrc: Oral Oral Oral Oral  SpO2: 96% 98%  97%  Weight:      Height:        General: Pt is alert, awake, not in acute distress Cardiovascular: RRR, S1/S2 +, no rubs, no gallops Respiratory:  CTA bilaterally, no wheezing, no rhonchi Abdominal: Soft, NT, ND, bowel sounds + Extremities: no edema, no cyanosis   Discharge Instructions  Discharge Instructions    Diet - low sodium heart healthy   Complete by:  As directed    Increase activity slowly   Complete by:  As directed      Allergies as of 09/12/2017      Reactions   Lisinopril Other (See Comments)   Dry cough    Nickel Rash       Medication List    STOP taking these medications   oseltamivir 75 MG capsule Commonly known as:  TAMIFLU     TAKE these medications   amLODipine 10 MG tablet Commonly known as:  NORVASC Take 1 tablet (10 mg total) by mouth daily.   benzonatate 100 MG capsule Commonly known as:  TESSALON Take 1 capsule (100 mg total) by mouth 3 (three) times daily as needed for cough.   bictegravir-emtricitabine-tenofovir AF 50-200-25 MG Tabs tablet Commonly known as:  [name] Take 1 tablet by mouth daily.   cefUROXime 500 MG tablet Commonly known as:  CEFTIN Take 1 tablet (500 mg total) by mouth 2 (two) times daily for 10 days.   guaiFENesin 600 MG 12 hr tablet Commonly known as:  MUCINEX Take 1 tablet (600 mg total) by mouth 2 (two) times daily as needed.   losartan 100 MG tablet Commonly known as:  COZAAR Take 1 tablet (100 mg total) by mouth daily.   naproxen sodium 220 MG tablet Commonly known as:  ALEVE Take 220-440 mg by mouth 2 (two) times daily as needed (for fever-reducing purposes).   sodium chloride 0.65 % Soln nasal spray Commonly known as:  OCEAN Place 1 spray into both nostrils as needed for congestion.       Allergies  Allergen Reactions  . Lisinopril Other (See Comments)    Dry cough    . Nickel Rash     Procedures/Studies:   Dg Chest 2 View  Result Date: 09/10/2017 CLINICAL DATA:  Pneumonia EXAM: CHEST  2 VIEW COMPARISON:  09/08/2017 FINDINGS: Interval improvement in the right lower lobe airspace opacity. Continued left lower lobe pneumonia. Small left effusion. Heart is borderline in size. No acute bony abnormality. IMPRESSION: Improving opacity at the right lung base. Continued left lower lobe pneumonia and minimal infiltrate at the right base. Small left effusion. Electronically Signed   By: Rolm Baptise M.D.   On: 09/10/2017 11:39   Dg Abd 1 View  Result Date: 09/11/2017 CLINICAL DATA:  Abdominal pain.  Fevers. EXAM: ABDOMEN - 1 VIEW COMPARISON:   CT 12/13/2009. FINDINGS: Soft tissue structures are unremarkable. No bowel distention. Stool noted throughout the colon. No free air. Mild left base infiltrate. No acute bony abnormality. IMPRESSION: 1.  Mild left base infiltrate. 2.  No acute intra-abdominal abnormality. Electronically Signed   By: Marcello Moores  Register   On: 09/11/2017 12:06   Dg Chest Port 1 View  Result Date: 09/08/2017 CLINICAL DATA:  Shortness of breath. EXAM: PORTABLE CHEST 1 VIEW COMPARISON:  09/07/2017 FINDINGS: Lungs are adequately inflated with continued bibasilar airspace opacification likely infection. Cannot exclude component of left pleural fluid. Cardiomediastinal silhouette and remainder of the exam is unchanged. IMPRESSION: Stable bibasilar airspace process likely pneumonia. Possible small amount of left pleural fluid. Electronically Signed   By: Marin Olp M.D.   On: 09/08/2017 10:48   Dg Chest Port 1 View  Result Date: 09/07/2017 CLINICAL DATA:  Fever for 4-5  days.  Weakness.  HIV. EXAM: PORTABLE CHEST 1 VIEW COMPARISON:  02/27/2008. FINDINGS: Normal cardiomediastinal silhouette. BILATERAL pulmonary opacities, LEFT greater than RIGHT consistent with acute pneumonia. There may be a small LEFT effusion. No pneumothorax. Bones unremarkable. Worsening aeration from priors. IMPRESSION: BILATERAL pulmonary opacities, LEFT greater than RIGHT consistent with acute pneumonia. Given that the patient is HIV positive, it is possible that the observed infiltrates could be either a typical or opportunistic infection. Electronically Signed   By: Staci Righter M.D.   On: 09/07/2017 15:58     The results of significant diagnostics from this hospitalization (including imaging, microbiology, ancillary and laboratory) are listed below for reference.     Microbiology: Recent Results (from the past 240 hour(s))  Blood Culture (routine x 2)     Status: Abnormal   Collection Time: 09/07/17  3:40 PM  Result Value Ref Range Status    Specimen Description   Final    BLOOD LEFT ARM Performed at [facility], [address] [place], [place] [address]    Special Requests   Final    BOTTLES DRAWN AEROBIC AND ANAEROBIC Blood Culture adequate volume Performed at [facility], [address] [place], [place] [address]    Culture  Setup Time   Final    GRAM POSITIVE COCCI IN BOTH AEROBIC AND ANAEROBIC BOTTLES CRITICAL RESULT CALLED TO, READ BACK BY AND VERIFIED WITH: M MACCIA,PHARMD AT 5102 09/08/17 BY L BENFIELD    Culture (A)  Final    STAPHYLOCOCCUS SPECIES (COAGULASE NEGATIVE) THE SIGNIFICANCE OF ISOLATING THIS ORGANISM FROM A SINGLE SET OF BLOOD CULTURES WHEN MULTIPLE SETS ARE DRAWN IS UNCERTAIN. PLEASE NOTIFY THE MICROBIOLOGY DEPARTMENT WITHIN ONE WEEK IF SPECIATION AND SENSITIVITIES ARE REQUIRED. Performed at [facility], [place] [address] [place], [place] [address]    Report Status 09/10/2017 FINAL  Final  Blood Culture (routine x 2)     Status: None (Preliminary result)   Collection Time: 09/07/17  3:40 PM  Result Value Ref Range Status   Specimen Description   Final    BLOOD RIGHT HAND Performed at [facility], [address] [place], [place] [address]    Special Requests   Final    BOTTLES DRAWN AEROBIC AND ANAEROBIC Blood Culture adequate volume Performed at [facility], [address], [name], [place] [address]    Culture   Final    NO GROWTH 4 DAYS Performed at [facility], [place] [address] [place], [place] [address]    Report Status PENDING  Incomplete  Blood Culture ID Panel (Reflexed)     Status: Abnormal   Collection Time: 09/07/17  3:40 PM  Result Value Ref Range Status   Enterococcus species NOT DETECTED NOT DETECTED Final   Listeria monocytogenes NOT DETECTED NOT DETECTED Final   Staphylococcus species DETECTED (A) NOT DETECTED Final    Comment: Methicillin (oxacillin) resistant coagulase negative staphylococcus. Possible blood  culture contaminant (unless isolated from more than one blood culture draw or clinical case suggests pathogenicity). No antibiotic treatment is indicated for blood  culture contaminants. CRITICAL RESULT CALLED TO, READ BACK BY AND VERIFIED WITH: M MACCIA,PHARMD AT 4008 09/08/17 BY L BENFIELD    Staphylococcus aureus NOT DETECTED NOT DETECTED Final   Methicillin resistance DETECTED (A) NOT DETECTED Final    Comment: CRITICAL RESULT CALLED TO, READ BACK BY AND VERIFIED WITH: M MACCIA,PHARMD AT 1618 09/08/17 BY L BENFIELD  Streptococcus species NOT DETECTED NOT DETECTED Final   Streptococcus agalactiae NOT DETECTED NOT DETECTED Final   Streptococcus pneumoniae NOT DETECTED NOT DETECTED Final   Streptococcus pyogenes NOT DETECTED NOT DETECTED Final   Acinetobacter baumannii NOT DETECTED NOT DETECTED Final   Enterobacteriaceae species NOT DETECTED NOT DETECTED Final   Enterobacter cloacae complex NOT DETECTED NOT DETECTED Final   Escherichia coli NOT DETECTED NOT DETECTED Final   Klebsiella oxytoca NOT DETECTED NOT DETECTED Final   Klebsiella pneumoniae NOT DETECTED NOT DETECTED Final   Proteus species NOT DETECTED NOT DETECTED Final   Serratia marcescens NOT DETECTED NOT DETECTED Final   Haemophilus influenzae NOT DETECTED NOT DETECTED Final   Neisseria meningitidis NOT DETECTED NOT DETECTED Final   Pseudomonas aeruginosa NOT DETECTED NOT DETECTED Final   Candida albicans NOT DETECTED NOT DETECTED Final   Candida glabrata NOT DETECTED NOT DETECTED Final   Candida krusei NOT DETECTED NOT DETECTED Final   Candida parapsilosis NOT DETECTED NOT DETECTED Final   Candida tropicalis NOT DETECTED NOT DETECTED Final    Comment: Performed at [facility], [place] [address] [place], [place] [address]  Culture, expectorated sputum-assessment     Status: None   Collection Time: 09/07/17  4:20 PM  Result Value Ref Range Status   Specimen Description SPUTUM  Final   Special Requests   Final     Immunocompromised Performed at [facility], [address] [place], [place] [address]    Sputum evaluation THIS SPECIMEN IS ACCEPTABLE FOR SPUTUM CULTURE  Final   Report Status 09/08/2017 FINAL  Final  Culture, respiratory (NON-Expectorated)     Status: None   Collection Time: 09/07/17  4:20 PM  Result Value Ref Range Status   Specimen Description SPUTUM  Final   Special Requests Immunocompromised Reflexed from [id number]  Final   Gram Stain   Final    ABUNDANT WBC PRESENT, PREDOMINANTLY PMN ABUNDANT GRAM POSITIVE COCCI MODERATE GRAM NEGATIVE RODS    Culture   Final    Consistent with normal respiratory flora. Performed at [facility], [place] [address], [place], [place] [address]    Report Status 09/10/2017 FINAL  Final  Respiratory Panel by PCR     Status: Abnormal   Collection Time: 09/07/17  6:00 PM  Result Value Ref Range Status   Adenovirus NOT DETECTED NOT DETECTED Final   Coronavirus 229E NOT DETECTED NOT DETECTED Final   Coronavirus HKU1 NOT DETECTED NOT DETECTED Final   Coronavirus NL63 NOT DETECTED NOT DETECTED Final   Coronavirus OC43 NOT DETECTED NOT DETECTED Final   Metapneumovirus DETECTED (A) NOT DETECTED Final   Rhinovirus / Enterovirus NOT DETECTED NOT DETECTED Final   Influenza A NOT DETECTED NOT DETECTED Final   Influenza B NOT DETECTED NOT DETECTED Final   Parainfluenza Virus 1 NOT DETECTED NOT DETECTED Final   Parainfluenza Virus 2 NOT DETECTED NOT DETECTED Final   Parainfluenza Virus 3 NOT DETECTED NOT DETECTED Final   Parainfluenza Virus 4 NOT DETECTED NOT DETECTED Final   Respiratory Syncytial Virus NOT DETECTED NOT DETECTED Final   Bordetella pertussis NOT DETECTED NOT DETECTED Final   Chlamydophila pneumoniae NOT DETECTED NOT DETECTED Final   Mycoplasma pneumoniae NOT DETECTED NOT DETECTED Final    Comment: Performed at [facility], [place] [address] [place], [place] [address]  MRSA PCR Screening     Status: None   Collection  Time: 09/07/17  9:04 PM  Result Value Ref Range Status  MRSA by PCR NEGATIVE NEGATIVE Final    Comment:        The GeneXpert MRSA Assay (FDA approved for NASAL specimens only), is one component of a comprehensive MRSA colonization surveillance program. It is not intended to diagnose MRSA infection nor to guide or monitor treatment for MRSA infections. Performed at [facility], [place] [address], [place], [place] [address]   Culture, blood (Routine X 2) w Reflex to ID Panel     Status: None (Preliminary result)   Collection Time: 09/08/17  6:12 PM  Result Value Ref Range Status   Specimen Description BLOOD RIGHT ANTECUBITAL  Final   Special Requests   Final    BOTTLES DRAWN AEROBIC AND ANAEROBIC Blood Culture adequate volume   Culture   Final    NO GROWTH 3 DAYS Performed at [facility], [place] [address], [place], [place] [address]    Report Status PENDING  Incomplete  Culture, blood (Routine X 2) w Reflex to ID Panel     Status: None (Preliminary result)   Collection Time: 09/08/17  6:14 PM  Result Value Ref Range Status   Specimen Description BLOOD LEFT HAND  Final   Special Requests   Final    BOTTLES DRAWN AEROBIC ONLY Blood Culture adequate volume   Culture   Final    NO GROWTH 3 DAYS Performed at [facility], [place] [address] [place], [place] [address]    Report Status PENDING  Incomplete     Labs: BNP (last 3 results) No results for input(s): BNP in the last 8760 hours. Basic Metabolic Panel: Recent Labs  Lab 09/08/17 0057 09/09/17 0520 09/10/17 0355 09/11/17 0418 09/12/17 0259  NA 134* 138 138 137 137  K 4.2 3.8 3.9 3.8 3.8  CL 108 110 112* 109 108  CO2 17* 18* 19* 19* 20*  GLUCOSE 115* 97 85 89 89  BUN 28* 18 16 15 14   CREATININE 2.37* 1.61* 1.55* 1.47* 1.48*  CALCIUM 7.5* 7.8* 7.9* 8.0* 8.4*  MG  --  1.7 1.6* 1.8 1.8  PHOS  --  2.6 2.9 3.3 3.5   Liver Function Tests: Recent Labs  Lab 09/08/17 0057 09/09/17 0520 09/10/17 0355  09/11/17 0418 09/12/17 0259  AST 26 15 13* 14* 13*  ALT 16* 12* 11* 11* 11*  ALKPHOS 64 63 65 60 68  BILITOT 1.5* 0.8 0.6 0.7 0.7  PROT 7.0 6.9 7.2 7.2 7.8  ALBUMIN 2.5* 2.4* 2.4* 2.4* 2.5*   No results for input(s): LIPASE, AMYLASE in the last 168 hours. No results for input(s): AMMONIA in the last 168 hours. CBC: Recent Labs  Lab 09/08/17 0057 09/09/17 0520 09/10/17 0355 09/11/17 0418 09/12/17 0259  WBC 17.1* 13.7* 9.5 7.5 8.8  NEUTROABS 15.1* 11.3* 6.9 4.9 5.8  HGB 13.6 12.4* 12.6* 12.3* 13.3  HCT 38.9* 35.5* 35.9* 36.0* 37.7*  MCV 88.6 88.8 90.0 88.9 88.1  PLT 143* 145* 158 175 216   Cardiac Enzymes: Recent Labs  Lab 09/07/17 1544  TROPONINI <0.03   BNP: Invalid input(s): POCBNP CBG: Recent Labs  Lab 09/07/17 1542  GLUCAP 92   D-Dimer No results for input(s): DDIMER in the last 72 hours. Hgb A1c Recent Labs    09/10/17 0355  HGBA1C 5.1   Lipid Profile No results for input(s): CHOL, HDL, LDLCALC, TRIG, CHOLHDL, LDLDIRECT in the last 72 hours. Thyroid function studies No results for input(s): TSH, T4TOTAL, T3FREE, THYROIDAB in the last 72 hours.  Invalid  input(s): FREET3 Anemia work up No results for input(s): VITAMINB12, FOLATE, FERRITIN, TIBC, IRON, RETICCTPCT in the last 72 hours. Urinalysis    Component Value Date/Time   COLORURINE YELLOW 09/07/2017 1832   APPEARANCEUR CLEAR 09/07/2017 1832   LABSPEC 1.015 09/07/2017 1832   PHURINE 6.0 09/07/2017 1832   GLUCOSEU NEGATIVE 09/07/2017 1832   HGBUR TRACE (A) 09/07/2017 1832   BILIRUBINUR NEGATIVE 09/07/2017 1832   KETONESUR NEGATIVE 09/07/2017 1832   PROTEINUR NEGATIVE 09/07/2017 1832   UROBILINOGEN 0.2 12/12/2009 1811   NITRITE NEGATIVE 09/07/2017 1832   LEUKOCYTESUR NEGATIVE 09/07/2017 1832   Sepsis Labs Invalid input(s): PROCALCITONIN,  WBC,  LACTICIDVEN Microbiology Recent Results (from the past 240 hour(s))  Blood Culture (routine x 2)     Status: Abnormal   Collection Time:  09/07/17  3:40 PM  Result Value Ref Range Status   Specimen Description   Final    BLOOD LEFT ARM Performed at [facility], [address] [place], [place] [address]    Special Requests   Final    BOTTLES DRAWN AEROBIC AND ANAEROBIC Blood Culture adequate volume Performed at [facility], [address] [place], [place] [address]    Culture  Setup Time   Final    GRAM POSITIVE COCCI IN BOTH AEROBIC AND ANAEROBIC BOTTLES CRITICAL RESULT CALLED TO, READ BACK BY AND VERIFIED WITH: M MACCIA,PHARMD AT 5427 09/08/17 BY L BENFIELD    Culture (A)  Final    STAPHYLOCOCCUS SPECIES (COAGULASE NEGATIVE) THE SIGNIFICANCE OF ISOLATING THIS ORGANISM FROM A SINGLE SET OF BLOOD CULTURES WHEN MULTIPLE SETS ARE DRAWN IS UNCERTAIN. PLEASE NOTIFY THE MICROBIOLOGY DEPARTMENT WITHIN ONE WEEK IF SPECIATION AND SENSITIVITIES ARE REQUIRED. Performed at [facility], [place] [address], [place], [place] [address]    Report Status 09/10/2017 FINAL  Final  Blood Culture (routine x 2)     Status: None (Preliminary result)   Collection Time: 09/07/17  3:40 PM  Result Value Ref Range Status   Specimen Description   Final    BLOOD RIGHT HAND Performed at [facility], [address] [place], [place] [address]    Special Requests   Final    BOTTLES DRAWN AEROBIC AND ANAEROBIC Blood Culture adequate volume Performed at [facility], [address], [place], [place] [address]    Culture   Final    NO GROWTH 4 DAYS Performed at [facility], [place] [address], [place], [place] [address]    Report Status PENDING  Incomplete  Blood Culture ID Panel (Reflexed)     Status: Abnormal   Collection Time: 09/07/17  3:40 PM  Result Value Ref Range Status   Enterococcus species NOT DETECTED NOT DETECTED Final   Listeria monocytogenes NOT DETECTED NOT DETECTED Final   Staphylococcus species DETECTED (A) NOT DETECTED Final    Comment: Methicillin (oxacillin) resistant  coagulase negative staphylococcus. Possible blood culture contaminant (unless isolated from more than one blood culture draw or clinical case suggests pathogenicity). No antibiotic treatment is indicated for blood  culture contaminants. CRITICAL RESULT CALLED TO, READ BACK BY AND VERIFIED WITH: M MACCIA,PHARMD AT 1062 09/08/17 BY L BENFIELD    Staphylococcus aureus NOT DETECTED NOT DETECTED Final   Methicillin resistance DETECTED (A) NOT DETECTED Final    Comment: CRITICAL RESULT CALLED TO, READ BACK BY AND VERIFIED WITH: M MACCIA,PHARMD AT 1618 09/08/17 BY L BENFIELD    Streptococcus species NOT DETECTED NOT  DETECTED Final   Streptococcus agalactiae NOT DETECTED NOT DETECTED Final   Streptococcus pneumoniae NOT DETECTED NOT DETECTED Final   Streptococcus pyogenes NOT DETECTED NOT DETECTED Final   Acinetobacter baumannii NOT DETECTED NOT DETECTED Final   Enterobacteriaceae species NOT DETECTED NOT DETECTED Final   Enterobacter cloacae complex NOT DETECTED NOT DETECTED Final   Escherichia coli NOT DETECTED NOT DETECTED Final   Klebsiella oxytoca NOT DETECTED NOT DETECTED Final   Klebsiella pneumoniae NOT DETECTED NOT DETECTED Final   Proteus species NOT DETECTED NOT DETECTED Final   Serratia marcescens NOT DETECTED NOT DETECTED Final   Haemophilus influenzae NOT DETECTED NOT DETECTED Final   Neisseria meningitidis NOT DETECTED NOT DETECTED Final   Pseudomonas aeruginosa NOT DETECTED NOT DETECTED Final   Candida albicans NOT DETECTED NOT DETECTED Final   Candida glabrata NOT DETECTED NOT DETECTED Final   Candida krusei NOT DETECTED NOT DETECTED Final   Candida parapsilosis NOT DETECTED NOT DETECTED Final   Candida tropicalis NOT DETECTED NOT DETECTED Final    Comment: Performed at [facility], [place] [address] [place], [place] [address]  Culture, expectorated sputum-assessment     Status: None   Collection Time: 09/07/17  4:20 PM  Result Value Ref Range Status   Specimen  Description SPUTUM  Final   Special Requests   Final    Immunocompromised Performed at [facility], [address] [place], [place] [address]    Sputum evaluation THIS SPECIMEN IS ACCEPTABLE FOR SPUTUM CULTURE  Final   Report Status 09/08/2017 FINAL  Final  Culture, respiratory (NON-Expectorated)     Status: None   Collection Time: 09/07/17  4:20 PM  Result Value Ref Range Status   Specimen Description SPUTUM  Final   Special Requests Immunocompromised Reflexed from [id number]  Final   Gram Stain   Final    ABUNDANT WBC PRESENT, PREDOMINANTLY PMN ABUNDANT GRAM POSITIVE COCCI MODERATE GRAM NEGATIVE RODS    Culture   Final    Consistent with normal respiratory flora. Performed at [facility], [place] [address], [place], [place] [address]    Report Status 09/10/2017 FINAL  Final  Respiratory Panel by PCR     Status: Abnormal   Collection Time: 09/07/17  6:00 PM  Result Value Ref Range Status   Adenovirus NOT DETECTED NOT DETECTED Final   Coronavirus 229E NOT DETECTED NOT DETECTED Final   Coronavirus HKU1 NOT DETECTED NOT DETECTED Final   Coronavirus NL63 NOT DETECTED NOT DETECTED Final   Coronavirus OC43 NOT DETECTED NOT DETECTED Final   Metapneumovirus DETECTED (A) NOT DETECTED Final   Rhinovirus / Enterovirus NOT DETECTED NOT DETECTED Final   Influenza A NOT DETECTED NOT DETECTED Final   Influenza B NOT DETECTED NOT DETECTED Final   Parainfluenza Virus 1 NOT DETECTED NOT DETECTED Final   Parainfluenza Virus 2 NOT DETECTED NOT DETECTED Final   Parainfluenza Virus 3 NOT DETECTED NOT DETECTED Final   Parainfluenza Virus 4 NOT DETECTED NOT DETECTED Final   Respiratory Syncytial Virus NOT DETECTED NOT DETECTED Final   Bordetella pertussis NOT DETECTED NOT DETECTED Final   Chlamydophila pneumoniae NOT DETECTED NOT DETECTED Final   Mycoplasma pneumoniae NOT DETECTED NOT DETECTED Final    Comment: Performed at [facility], [place] [address] [place], [place]  [address]  MRSA PCR Screening     Status: None   Collection Time: 09/07/17  9:04 PM  Result Value Ref Range Status   MRSA by PCR  NEGATIVE NEGATIVE Final    Comment:        The GeneXpert MRSA Assay (FDA approved for NASAL specimens only), is one component of a comprehensive MRSA colonization surveillance program. It is not intended to diagnose MRSA infection nor to guide or monitor treatment for MRSA infections. Performed at [facility], [place] [address], [place], [place] [address]   Culture, blood (Routine X 2) w Reflex to ID Panel     Status: None (Preliminary result)   Collection Time: 09/08/17  6:12 PM  Result Value Ref Range Status   Specimen Description BLOOD RIGHT ANTECUBITAL  Final   Special Requests   Final    BOTTLES DRAWN AEROBIC AND ANAEROBIC Blood Culture adequate volume   Culture   Final    NO GROWTH 3 DAYS Performed at [facility], [place] [address] [place], [place] [address]    Report Status PENDING  Incomplete  Culture, blood (Routine X 2) w Reflex to ID Panel     Status: None (Preliminary result)   Collection Time: 09/08/17  6:14 PM  Result Value Ref Range Status   Specimen Description BLOOD LEFT HAND  Final   Special Requests   Final    BOTTLES DRAWN AEROBIC ONLY Blood Culture adequate volume   Culture   Final    NO GROWTH 3 DAYS Performed at [facility], [place] [address], [place], [place] [address]    Report Status PENDING  Incomplete     Time coordinating discharge: Over 30 minutes  SIGNED:   Debbe Odea, MD  [facility] 09/12/2017, 12:57 PM Pager   If 7PM-7AM, please contact night-coverage [email or web address] Password [email or web address]

## 2017-09-13 LAB — CULTURE, BLOOD (ROUTINE X 2)
Culture: NO GROWTH
Culture: NO GROWTH
Special Requests: ADEQUATE
Special Requests: ADEQUATE

## 2017-09-14 ENCOUNTER — Ambulatory Visit (INDEPENDENT_AMBULATORY_CARE_PROVIDER_SITE_OTHER): Payer: BLUE CROSS/BLUE SHIELD | Admitting: Adult Health

## 2017-09-14 ENCOUNTER — Encounter: Payer: Self-pay | Admitting: Adult Health

## 2017-09-14 VITALS — BP 120/74 | HR 93 | Temp 98.3°F | Wt 274.0 lb

## 2017-09-14 DIAGNOSIS — J123 Human metapneumovirus pneumonia: Secondary | ICD-10-CM | POA: Diagnosis not present

## 2017-09-14 LAB — CBC WITH DIFFERENTIAL/PLATELET
Basophils Absolute: 0 10*3/uL (ref 0.0–0.1)
Basophils Relative: 0.7 % (ref 0.0–3.0)
Eosinophils Absolute: 0.4 10*3/uL (ref 0.0–0.7)
Eosinophils Relative: 5.9 % — ABNORMAL HIGH (ref 0.0–5.0)
HCT: 44 % (ref 39.0–52.0)
Hemoglobin: 15.3 g/dL (ref 13.0–17.0)
Lymphocytes Relative: 27.7 % (ref 12.0–46.0)
Lymphs Abs: 1.8 10*3/uL (ref 0.7–4.0)
MCHC: 34.8 g/dL (ref 30.0–36.0)
MCV: 90.3 fl (ref 78.0–100.0)
Monocytes Absolute: 0.6 10*3/uL (ref 0.1–1.0)
Monocytes Relative: 9.2 % (ref 3.0–12.0)
Neutro Abs: 3.7 10*3/uL (ref 1.4–7.7)
Neutrophils Relative %: 56.5 % (ref 43.0–77.0)
Platelets: 373 10*3/uL (ref 150.0–400.0)
RBC: 4.87 Mil/uL (ref 4.22–5.81)
RDW: 14 % (ref 11.5–15.5)
WBC: 6.5 10*3/uL (ref 4.0–10.5)

## 2017-09-14 LAB — BASIC METABOLIC PANEL
BUN: 22 mg/dL (ref 6–23)
CO2: 23 mEq/L (ref 19–32)
Calcium: 8.9 mg/dL (ref 8.4–10.5)
Chloride: 107 mEq/L (ref 96–112)
Creatinine, Ser: 1.44 mg/dL (ref 0.40–1.50)
GFR: 59.48 mL/min — ABNORMAL LOW (ref >60.00)
Glucose, Bld: 75 mg/dL (ref 70–99)
Potassium: 4.6 mEq/L (ref 3.5–5.1)
Sodium: 138 mEq/L (ref 135–145)

## 2017-09-14 MED ORDER — CEFUROXIME AXETIL 500 MG PO TABS: 500.0000 mg | ORAL_TABLET | Freq: Two times a day (BID) | ORAL | 0 refills | Status: AC

## 2017-09-14 MED ORDER — ALBUTEROL SULFATE HFA 108 (90 BASE) MCG/ACT IN AERS: 2.0000 | INHALATION_SPRAY | Freq: Four times a day (QID) | RESPIRATORY_TRACT | 1 refills | Status: DC | PRN

## 2017-09-14 NOTE — Progress Notes (Signed)
Subjective:    Patient ID: Lee [name], male    DOB: 19-Nov-1980, 37 y.o.   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  Presents to the office today for TCM visit  He was admitted on 07/07/2018 Was discharged on 09/12/2017  He presented to the emergency room with the complaint of a cough for 5 days prior to admission.  He also reported having fevers up to 103.3 with associated chills and diaphoresis.  His cough was productive of yellow and thick sputum.  Does report having some bleeding from his nose and possibly blood in 1 tinged sputum.  To urgent care and was started on Tamiflu but influenza screen was later found to be negative.  In the emergency room he was found to be septic and his chest x-ray revealed bilateral pneumonia  Respiratory viral panel was positive for Metapneumovirus  Urine strep pneumo urine Ag positive  Chest x-ray revealed bilateral pulmonary opacities left greater than right.  Initially treated with ceftriaxone and vancomycin, vancomycin was subsequently discontinued White blood cell noted to improve from 17.8>17.1>13.1>9.5>7.5 upon discharge  He was discharged on Ceftin 500 mg BID for 10 days but was only prescribed 8 pills.    He reports that since being discharged from the hospital he continues to feel fatigued but is recovering. He feels short of breath at times and has a semi productive cough. He does not have any fevers or chills but is experiencing diaphoresis at night    Review of Systems  Constitutional: Positive for activity change, appetite change, diaphoresis and fatigue. Negative for chills and fever.  HENT: Negative.   Eyes: Negative.   Respiratory: Positive for cough and shortness of breath. Negative for chest tightness and wheezing.   Cardiovascular:  Negative.   Gastrointestinal: Negative.   Genitourinary: Negative.   Musculoskeletal: Negative.   Allergic/Immunologic: Negative.   Neurological: Negative.   Hematological: Negative.   Psychiatric/Behavioral: Negative.   All other systems reviewed and are negative.  Past Medical History:  Diagnosis Date  . Eczema   . Environmental allergies   . Essential hypertension   . HIV test positive ([place])   . 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[place]) 07/24/2016  . Shingles     Social History   Socioeconomic History  . Marital status: Single    Spouse name: Not on file  . Number of children: Not on file  . Years of education: Not on file  . Highest education level: Not on file  Social Needs  . Financial resource strain: Not on file  . Food insecurity - worry: Not on file  . Food insecurity - inability: Not on file  . Transportation needs - medical: Not on file  . Transportation needs - non-medical: Not on file  Occupational History  . Not on file  Tobacco Use  . Smoking status: Never Smoker  . Smokeless tobacco: Never Used  Substance and Sexual Activity  . Alcohol use: No  . Drug use: No  . Sexual activity: Not Currently    Partners: Male    Birth control/protection: Condom    Comment: Given Condoms  Other Topics Concern  . Not on file  Social History Narrative   Works as Nurse, 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    Past Surgical History:  Procedure Laterality Date  . TYMPANOSTOMY TUBE PLACEMENT    .  WISDOM TOOTH EXTRACTION    . WRIST SURGERY      Family History  Problem Relation Age of Onset  . Hypercalcemia Mother   . Hypertension Mother   . Hypertension Father     Allergies  Allergen Reactions  . Lisinopril Other (See Comments)    Dry cough    . Nickel Rash    Current Outpatient Medications on File Prior to Visit  Medication Sig Dispense Refill  . amLODipine (NORVASC) 10 MG tablet Take 1 tablet (10 mg total) by mouth daily. 90 tablet 3  . benzonatate (TESSALON)  100 MG capsule Take 1 capsule (100 mg total) by mouth 3 (three) times daily as needed for cough. 20 capsule 0  . bictegravir-emtricitabine-tenofovir AF ([name]) 50-200-25 MG TABS tablet Take 1 tablet by mouth daily. 30 tablet 6  . cefUROXime (CEFTIN) 500 MG tablet Take 1 tablet (500 mg total) by mouth 2 (two) times daily for 10 days. 8 tablet 0  . guaiFENesin (MUCINEX) 600 MG 12 hr tablet Take 1 tablet (600 mg total) by mouth 2 (two) times daily as needed. 30 tablet 0  . losartan (COZAAR) 100 MG tablet Take 1 tablet (100 mg total) by mouth daily. 90 tablet 3  . naproxen sodium (ALEVE) 220 MG tablet Take 220-440 mg by mouth 2 (two) times daily as needed (for fever-reducing purposes).     . sodium chloride (OCEAN) 0.65 % SOLN nasal spray Place 1 spray into both nostrils as needed for congestion. 30 mL 0   No current facility-administered medications on file prior to visit.     BP 120/74 (BP Location: Left Arm)   Pulse 93   Temp 98.3 F (36.8 C) (Oral)   Wt 274 lb (124.3 kg)   SpO2 98%   BMI 33.35 kg/m       Objective:   Physical Exam  Constitutional: He is oriented to person, place, and time. He appears well-developed and well-nourished. No distress.  Neck: Normal range of motion. Neck supple.  Cardiovascular: Normal rate, regular rhythm, normal heart sounds and intact distal pulses. Exam reveals no gallop and no friction rub.  No murmur heard. Pulmonary/Chest: Effort normal. He has no decreased breath sounds. He has wheezes in the left middle field. He has rhonchi in the left middle field. He has no rales.  Lymphadenopathy:    He has no cervical adenopathy.  Neurological: He is alert and oriented to person, place, and time.  Skin: Skin is warm and dry. No rash noted. He is not diaphoretic. No erythema. No pallor.  Psychiatric: He has a normal mood and affect. His behavior is normal. Judgment and thought content normal.  Nursing note and vitals reviewed.     Assessment & Plan:    1. Pneumonia due to human metapneumovirus (hMPV) - Will send in 6 more days of Ceftin and prescribe Albuterol inhaler to use as needed. Will get repeat chest x ray in 3 weeks.  - Reviewed imaging and lab results with patient, all questions answered.  - albuterol (PROVENTIL HFA;VENTOLIN HFA) 108 (90 Base) MCG/ACT inhaler; Inhale 2 puffs into the lungs every 6 (six) hours as needed for wheezing or shortness of breath.  Dispense: 1 Inhaler; Refill: 1 - cefUROXime (CEFTIN) 500 MG tablet; Take 1 tablet (500 mg total) by mouth 2 (two) times daily for 6 days.  Dispense: 12 tablet; Refill: 0 - DG Chest 2 View; Future - CBC with Differential/Platelet - Basic metabolic panel; Future - return precautions given  [name], NP

## 2017-09-14 NOTE — Addendum Note (Signed)
Addended by: Elmer Picker on: 09/14/2017 01:47 PM   Modules accepted: Orders

## 2017-09-17 ENCOUNTER — Encounter: Payer: Self-pay | Admitting: Family

## 2017-09-17 ENCOUNTER — Ambulatory Visit (INDEPENDENT_AMBULATORY_CARE_PROVIDER_SITE_OTHER): Payer: BLUE CROSS/BLUE SHIELD | Admitting: Family

## 2017-09-17 VITALS — BP 110/72 | HR 73 | Temp 97.8°F | Resp 18 | Ht 76.0 in | Wt 272.5 lb

## 2017-09-17 DIAGNOSIS — B20 Human immunodeficiency virus [HIV] disease: Secondary | ICD-10-CM | POA: Diagnosis not present

## 2017-09-17 DIAGNOSIS — J123 Human metapneumovirus pneumonia: Secondary | ICD-10-CM

## 2017-09-17 NOTE — Assessment & Plan Note (Addendum)
Lee [name] continues to improve and per his report is about 75% recovered. He continues to have a cough and occasional shortness of breath that is relieved by his inhaler. He has had no additional fevers. Does not need the remaining antibiotics as he completed the recommended 4 day course of the hospital. Continue current dosage of albuterol as needed for symptom management as needed for comfort. Currently on FMLA. Note written for him to return to work on 3/4. No additional follow up for pneumonia needed.

## 2017-09-17 NOTE — Progress Notes (Signed)
Subjective:    Patient ID: Lee [name], male    DOB: 02/10/1981, 37 y.o.   MRN: [id number]  Chief Complaint  Patient presents with  . Hospitalization Follow-up    still periods of SOB using inhaler with relief     HPI:  Lee [name] is a 37 y.o. male who presents today for a hospitalization follow up.  Lee [name] was recently evaluated in the emergency department and admitted to the hospital chief complaint shortness of breath and weakness starting approximately 5 days prior to presentation. He was initially seen at an urgent care center and started on Tamiflu with concern for flu, with testing of the time being negative. Cough is described as productive with thick yellow and white phlegm. He is HIV positive, however well controlled with most recent CD4 count of and a viral load that is undetectable. On physical exam he is noted to have tachypnea and diffuse rhonchi. White blood cell count was 17.8 and a GFR of 22. His EKG showed sinus tachycardia. Chest x-ray showed bilateral pulmonary opacities left greater than right consistent with acute pneumonia. In the ED he did have a lactic acid level III.10 was diagnosed with sepsis. He was initially hypotensive but responded to a fluid bolus. He was started on cefepime and vancomycin. His respiratory panel was positive for metapneumovirus. He was continued on vancomycin while in the hospital and changed to Cefuroxime for 4 days. Following discharge he was seen by his PCP with noted improvement and continued fatigue. All hospital and previous office visit notes, labs and imaging were reviewed in detail.   He reports completing the Ceftin as prescribed no adverse side effects. Describes feeling fatigued but about 75% recovered and continues to progress on a daily basis. Continues to have cough on occasion. Diarrhea has resolved. Using an inhaler 1-2 times per day. He has plans to return to work on Monday and will need a note.   Allergies    Allergen Reactions  . Lisinopril Other (See Comments)    Dry cough    . Nickel Rash      Outpatient Medications Prior to Visit  Medication Sig Dispense Refill  . albuterol (PROVENTIL HFA;VENTOLIN HFA) 108 (90 Base) MCG/ACT inhaler Inhale 2 puffs into the lungs every 6 (six) hours as needed for wheezing or shortness of breath. 1 Inhaler 1  . amLODipine (NORVASC) 10 MG tablet Take 1 tablet (10 mg total) by mouth daily. 90 tablet 3  . benzonatate (TESSALON) 100 MG capsule Take 1 capsule (100 mg total) by mouth 3 (three) times daily as needed for cough. 20 capsule 0  . bictegravir-emtricitabine-tenofovir AF ([name]) 50-200-25 MG TABS tablet Take 1 tablet by mouth daily. 30 tablet 6  . cefUROXime (CEFTIN) 500 MG tablet Take 1 tablet (500 mg total) by mouth 2 (two) times daily for 6 days. 12 tablet 0  . guaiFENesin (MUCINEX) 600 MG 12 hr tablet Take 1 tablet (600 mg total) by mouth 2 (two) times daily as needed. 30 tablet 0  . losartan (COZAAR) 100 MG tablet Take 1 tablet (100 mg total) by mouth daily. 90 tablet 3  . naproxen sodium (ALEVE) 220 MG tablet Take 220-440 mg by mouth 2 (two) times daily as needed (for fever-reducing purposes).     . sodium chloride (OCEAN) 0.65 % SOLN nasal spray Place 1 spray into both nostrils as needed for congestion. 30 mL 0   No facility-administered medications prior to visit.      Past  Medical History:  Diagnosis Date  . Eczema   . Environmental allergies   . Essential hypertension   . HIV test positive ([place])   . 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[place]) 07/24/2016  . Shingles      Past Surgical History:  Procedure Laterality Date  . TYMPANOSTOMY TUBE PLACEMENT    . WISDOM TOOTH EXTRACTION    . WRIST SURGERY         Review of Systems  Constitutional: Positive for fatigue. Negative for chills, diaphoresis, fever and unexpected weight change.  HENT: Negative for congestion, ear pain, postnasal drip, rhinorrhea, sinus pressure, sinus  pain, sneezing, sore throat, trouble swallowing and voice change.   Respiratory: Positive for cough and shortness of breath. Negative for choking, chest tightness and wheezing.   Cardiovascular: Negative for chest pain, palpitations and leg swelling.  Neurological: Negative for dizziness and weakness.      Objective:    BP 110/72 (BP Location: Right Arm, Patient Position: Sitting, Cuff Size: Large)   Pulse 73   Temp 97.8 F (36.6 C) (Oral)   Resp 18   Ht 6\' 4"  (1.93 m)   Wt 272 lb 8 oz (123.6 kg)   SpO2 98% Comment: room air  BMI 33.17 kg/m  Nursing note and vital signs reviewed.  Physical Exam  Constitutional: He is oriented to person, place, and time. He appears well-developed and well-nourished. No distress.  Seated in the chair; pleasant.   HENT:  Mouth/Throat: Oropharynx is clear and moist.  Neck: Neck supple.  Cardiovascular: Normal rate, regular rhythm, normal heart sounds and intact distal pulses. Exam reveals no gallop and no friction rub.  No murmur heard. Pulmonary/Chest: Effort normal. No respiratory distress. He has no wheezes. He has rales. He exhibits no tenderness.  Lymphadenopathy:    He has no cervical adenopathy.  Neurological: He is alert and oriented to person, place, and time.  Skin: Skin is warm and dry.  Psychiatric: He has a normal mood and affect. His behavior is normal. Judgment and thought content normal.       Assessment & Plan:   Problem List Items Addressed This Visit      Respiratory   Pneumonia due to human metapneumovirus (hMPV) - Primary    Lee [name] continues to improve and per his report is about 75% recovered. He continues to have a cough and occasional shortness of breath that is relieved by his inhaler. He has had no additional fevers. Does not need the remaining antibiotics as he completed the recommended 4 day course of the hospital. Continue current dosage of albuterol as needed for symptom management as needed for comfort.  Currently on FMLA. Note written for him to return to work on 3/4. No additional follow up for pneumonia needed.        Other   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[place])    Stable with current [name]. Continue follow up with Dr. Johnnye Sima as scheduled.           I am having Lee [name] maintain his amLODipine, bictegravir-emtricitabine-tenofovir AF, losartan, naproxen sodium, sodium chloride, benzonatate, guaiFENesin, albuterol, and cefUROXime.   Follow-up: Return if symptoms worsen or fail to improve.   Terri Piedra, MSN, [facility]

## 2017-09-17 NOTE — Assessment & Plan Note (Signed)
Stable with current [name]. Continue follow up with Dr. Johnnye Sima as scheduled.

## 2017-09-17 NOTE — Patient Instructions (Signed)
Nice to meet you.  You may stop the antibiotic.  Continue to use the inhaler as needed.  We will write a letter for return to work.   Continue follow up with Dr. Johnnye Sima as scheduled.

## 2017-11-20 ENCOUNTER — Other Ambulatory Visit: Payer: Self-pay | Admitting: Infectious Diseases

## 2017-11-20 DIAGNOSIS — I10 Essential (primary) hypertension: Secondary | ICD-10-CM

## 2017-11-23 DIAGNOSIS — T7840XA Allergy, unspecified, initial encounter: Secondary | ICD-10-CM | POA: Insufficient documentation

## 2017-11-23 DIAGNOSIS — H6983 Other specified disorders of Eustachian tube, bilateral: Secondary | ICD-10-CM | POA: Diagnosis not present

## 2017-11-23 DIAGNOSIS — H7292 Unspecified perforation of tympanic membrane, left ear: Secondary | ICD-10-CM | POA: Diagnosis not present

## 2017-11-23 DIAGNOSIS — Z9622 Myringotomy tube(s) status: Secondary | ICD-10-CM | POA: Diagnosis not present

## 2017-11-28 ENCOUNTER — Other Ambulatory Visit: Payer: Self-pay

## 2017-11-28 DIAGNOSIS — I10 Essential (primary) hypertension: Secondary | ICD-10-CM

## 2017-11-28 MED ORDER — LOSARTAN POTASSIUM 100 MG PO TABS: 100.0000 mg | ORAL_TABLET | Freq: Every day | ORAL | 3 refills | Status: DC

## 2017-12-27 ENCOUNTER — Other Ambulatory Visit: Payer: Self-pay | Admitting: Behavioral Health

## 2017-12-27 DIAGNOSIS — I10 Essential (primary) hypertension: Secondary | ICD-10-CM

## 2017-12-27 MED ORDER — LOSARTAN POTASSIUM 100 MG PO TABS: 100.0000 mg | ORAL_TABLET | Freq: Every day | ORAL | 3 refills | Status: DC

## 2018-01-23 ENCOUNTER — Ambulatory Visit: Payer: BLUE CROSS/BLUE SHIELD | Admitting: Infectious Diseases

## 2018-02-06 ENCOUNTER — Other Ambulatory Visit: Payer: Self-pay | Admitting: Pharmacist

## 2018-02-06 DIAGNOSIS — B20 Human immunodeficiency virus [HIV] disease: Secondary | ICD-10-CM

## 2018-02-06 MED ORDER — BICTEGRAVIR-EMTRICITAB-TENOFOV 50-200-25 MG PO TABS: 1.0000 | ORAL_TABLET | Freq: Every day | ORAL | 3 refills | Status: DC

## 2018-02-15 ENCOUNTER — Other Ambulatory Visit: Payer: Self-pay | Admitting: Infectious Diseases

## 2018-02-15 DIAGNOSIS — B20 Human immunodeficiency virus [HIV] disease: Secondary | ICD-10-CM

## 2018-02-18 ENCOUNTER — Other Ambulatory Visit: Payer: Self-pay | Admitting: *Deleted

## 2018-02-18 NOTE — Telephone Encounter (Signed)
Patient has appointment with Dr. Johnnye Sima this Thursday, August 1.  RX refilled for 1 month already today.

## 2018-02-21 ENCOUNTER — Ambulatory Visit (INDEPENDENT_AMBULATORY_CARE_PROVIDER_SITE_OTHER): Payer: BLUE CROSS/BLUE SHIELD | Admitting: Infectious Diseases

## 2018-02-21 ENCOUNTER — Encounter: Payer: Self-pay | Admitting: Infectious Diseases

## 2018-02-21 ENCOUNTER — Other Ambulatory Visit (HOSPITAL_COMMUNITY)
Admission: RE | Admit: 2018-02-21 | Discharge: 2018-02-21 | Disposition: A | Discharge status: Home / Self Care | Payer: BLUE CROSS/BLUE SHIELD | Source: Ambulatory Visit | Attending: Infectious Diseases | Admitting: Infectious Diseases

## 2018-02-21 VITALS — BP 118/80 | HR 82 | Temp 98.5°F | Wt 296.0 lb

## 2018-02-21 DIAGNOSIS — A539 Syphilis, unspecified: Secondary | ICD-10-CM

## 2018-02-21 DIAGNOSIS — Z113 Encounter for screening for infections with a predominantly sexual mode of transmission: Secondary | ICD-10-CM | POA: Insufficient documentation

## 2018-02-21 DIAGNOSIS — B20 Human immunodeficiency virus [HIV] disease: Secondary | ICD-10-CM | POA: Diagnosis not present

## 2018-02-21 DIAGNOSIS — I10 Essential (primary) hypertension: Secondary | ICD-10-CM | POA: Diagnosis not present

## 2018-02-21 MED ORDER — AMLODIPINE BESYLATE 10 MG PO TABS: 10.0000 mg | ORAL_TABLET | Freq: Every day | ORAL | 3 refills | Status: DC

## 2018-02-21 NOTE — Assessment & Plan Note (Signed)
Will repeat his RPR

## 2018-02-21 NOTE — Assessment & Plan Note (Signed)
refil lnorvasc Well controlled (has missed only 1 dose)

## 2018-02-21 NOTE — Progress Notes (Signed)
   Subjective:    Patient ID: Erick Alley, male    DOB: 1981/05/22, 37 y.o.   MRN: [id number]  HPI 37 yo M with hx of HTN and HIV+ dx 08-03-16.  He had first visit 2-15 and was also found to have syphilis. He received IM PEN G on 2-22.  His genotype is naive. He is HLA negative.  Has been on triumeq but at 5-31 he had increased LFTs and Cr on labs. He had repeat labs done on 6-8 which confirmed this. He was seen in f/u with pharm 02-2017 and was changed to biktarvy.   He was in hospital 08-2017 with HMPV (metapneumovirus) pneumonia  Has been feeling well. Out of amlodipine. Denies missed ART. Has gained 24#.  Has been exercising more. Has been eating less.   HIV 1 RNA Quant (copies/mL)  Date Value  07/26/2017 <20 NOT DETECTED  03/09/2017 23 (H)  12/21/2016 26 (H)   CD4 T Cell Abs (/uL)  Date Value  07/26/2017 570  12/21/2016 430  09/07/2016 280 (L)     Review of Systems  Constitutional: Positive for unexpected weight change. Negative for appetite change.  HENT: Positive for postnasal drip.   Respiratory: Negative for shortness of breath.   Cardiovascular: Negative for chest pain.  Gastrointestinal: Negative for constipation and diarrhea.  Genitourinary: Negative for difficulty urinating.  Neurological: Negative for headaches.  Please see HPI. All other systems reviewed and negative.      Objective:   Physical Exam  Constitutional: He is oriented to person, place, and time. He appears well-developed and well-nourished.  HENT:  Head: Normocephalic and atraumatic.  Mouth/Throat: No oropharyngeal exudate.  Eyes: Pupils are equal, round, and reactive to light. EOM are normal.  Neck: Normal range of motion. Neck supple.  Cardiovascular: Normal rate, regular rhythm and normal heart sounds.  Pulmonary/Chest: Effort normal and breath sounds normal.  Abdominal: Soft. Bowel sounds are normal. He exhibits no distension. There is no tenderness.  Musculoskeletal: Normal  range of motion. He exhibits no edema.  Lymphadenopathy:    He has no cervical adenopathy.  Neurological: He is alert and oriented to person, place, and time.  Psychiatric: He has a normal mood and affect.          Assessment & Plan:

## 2018-02-21 NOTE — Assessment & Plan Note (Signed)
Given condoms Defers PCV Continue biktarvy Is in relationship with HIV+ M, not having sex.  rtc in 9 months

## 2018-02-22 LAB — CBC
HCT: 47 % (ref 38.5–50.0)
Hemoglobin: 16.2 g/dL (ref 13.2–17.1)
MCH: 31.3 pg (ref 27.0–33.0)
MCHC: 34.5 g/dL (ref 32.0–36.0)
MCV: 90.9 fL (ref 80.0–100.0)
MPV: 9.8 fL (ref 7.5–12.5)
Platelets: 241 10*3/uL (ref 140–400)
RBC: 5.17 10*6/uL (ref 4.20–5.80)
RDW: 13.5 % (ref 11.0–15.0)
WBC: 6.5 10*3/uL (ref 3.8–10.8)

## 2018-02-22 LAB — COMPREHENSIVE METABOLIC PANEL
AG Ratio: 0.9 (calc) — ABNORMAL LOW (ref 1.0–2.5)
ALT: 91 U/L — ABNORMAL HIGH (ref 9–46)
AST: 58 U/L — ABNORMAL HIGH (ref 10–40)
Albumin: 4.1 g/dL (ref 3.6–5.1)
Alkaline phosphatase (APISO): 79 U/L (ref 40–115)
BUN/Creatinine Ratio: 12 (calc) (ref 6–22)
BUN: 24 mg/dL (ref 7–25)
CO2: 24 mmol/L (ref 20–32)
Calcium: 8.9 mg/dL (ref 8.6–10.3)
Chloride: 104 mmol/L (ref 98–110)
Creat: 2.01 mg/dL — ABNORMAL HIGH (ref 0.60–1.35)
Globulin: 4.5 g/dL (calc) — ABNORMAL HIGH (ref 1.9–3.7)
Glucose, Bld: 90 mg/dL (ref 65–99)
Potassium: 3.8 mmol/L (ref 3.5–5.3)
Sodium: 137 mmol/L (ref 135–146)
Total Bilirubin: 0.9 mg/dL (ref 0.2–1.2)
Total Protein: 8.6 g/dL — ABNORMAL HIGH (ref 6.1–8.1)

## 2018-02-22 LAB — URINE CYTOLOGY ANCILLARY ONLY
Chlamydia: NEGATIVE
Neisseria Gonorrhea: NEGATIVE

## 2018-02-22 LAB — RPR: RPR Ser Ql: REACTIVE — AB

## 2018-02-22 LAB — RPR TITER: RPR Titer: 1:16 {titer} — ABNORMAL HIGH

## 2018-02-22 LAB — T-HELPER CELL (CD4) - (RCID CLINIC ONLY)
CD4 % Helper T Cell: 26 % — ABNORMAL LOW (ref 33–55)
CD4 T Cell Abs: 600 /uL (ref 400–2700)

## 2018-02-22 LAB — FLUORESCENT TREPONEMAL AB(FTA)-IGG-BLD: Fluorescent Treponemal ABS: REACTIVE — AB

## 2018-02-23 LAB — HIV-1 RNA QUANT-NO REFLEX-BLD
HIV 1 RNA Quant: 28 copies/mL — ABNORMAL HIGH
HIV-1 RNA Quant, Log: 1.45 Log copies/mL — ABNORMAL HIGH

## 2018-03-15 DIAGNOSIS — H9212 Otorrhea, left ear: Secondary | ICD-10-CM | POA: Diagnosis not present

## 2018-03-15 DIAGNOSIS — H6983 Other specified disorders of Eustachian tube, bilateral: Secondary | ICD-10-CM | POA: Diagnosis not present

## 2018-03-15 DIAGNOSIS — Z9622 Myringotomy tube(s) status: Secondary | ICD-10-CM | POA: Diagnosis not present

## 2018-03-15 DIAGNOSIS — H7292 Unspecified perforation of tympanic membrane, left ear: Secondary | ICD-10-CM | POA: Diagnosis not present

## 2018-04-22 ENCOUNTER — Other Ambulatory Visit: Payer: Self-pay | Admitting: *Deleted

## 2018-04-22 ENCOUNTER — Other Ambulatory Visit: Payer: Self-pay | Admitting: Pharmacist

## 2018-04-22 DIAGNOSIS — B20 Human immunodeficiency virus [HIV] disease: Secondary | ICD-10-CM

## 2018-04-22 MED ORDER — BICTEGRAVIR-EMTRICITAB-TENOFOV 50-200-25 MG PO TABS: 1.0000 | ORAL_TABLET | Freq: Every day | ORAL | 8 refills | Status: DC

## 2018-05-10 DIAGNOSIS — J301 Allergic rhinitis due to pollen: Secondary | ICD-10-CM | POA: Diagnosis not present

## 2018-05-10 DIAGNOSIS — J3089 Other allergic rhinitis: Secondary | ICD-10-CM | POA: Diagnosis not present

## 2018-05-10 DIAGNOSIS — R05 Cough: Secondary | ICD-10-CM | POA: Diagnosis not present

## 2018-05-10 DIAGNOSIS — J309 Allergic rhinitis, unspecified: Secondary | ICD-10-CM | POA: Diagnosis not present

## 2018-05-16 ENCOUNTER — Encounter: Payer: Self-pay | Admitting: Family Medicine

## 2018-05-20 DIAGNOSIS — J301 Allergic rhinitis due to pollen: Secondary | ICD-10-CM | POA: Diagnosis not present

## 2018-05-21 DIAGNOSIS — J3089 Other allergic rhinitis: Secondary | ICD-10-CM | POA: Diagnosis not present

## 2018-05-21 DIAGNOSIS — J3081 Allergic rhinitis due to animal (cat) (dog) hair and dander: Secondary | ICD-10-CM | POA: Diagnosis not present

## 2018-05-24 DIAGNOSIS — J3081 Allergic rhinitis due to animal (cat) (dog) hair and dander: Secondary | ICD-10-CM | POA: Diagnosis not present

## 2018-05-24 DIAGNOSIS — J301 Allergic rhinitis due to pollen: Secondary | ICD-10-CM | POA: Diagnosis not present

## 2018-05-24 DIAGNOSIS — J3089 Other allergic rhinitis: Secondary | ICD-10-CM | POA: Diagnosis not present

## 2018-05-29 DIAGNOSIS — J3081 Allergic rhinitis due to animal (cat) (dog) hair and dander: Secondary | ICD-10-CM | POA: Diagnosis not present

## 2018-05-29 DIAGNOSIS — J3089 Other allergic rhinitis: Secondary | ICD-10-CM | POA: Diagnosis not present

## 2018-05-29 DIAGNOSIS — J301 Allergic rhinitis due to pollen: Secondary | ICD-10-CM | POA: Diagnosis not present

## 2018-05-31 ENCOUNTER — Other Ambulatory Visit: Payer: Self-pay

## 2018-05-31 DIAGNOSIS — B20 Human immunodeficiency virus [HIV] disease: Secondary | ICD-10-CM

## 2018-05-31 MED ORDER — BICTEGRAVIR-EMTRICITAB-TENOFOV 50-200-25 MG PO TABS: 1.0000 | ORAL_TABLET | Freq: Every day | ORAL | 8 refills | Status: DC

## 2018-05-31 NOTE — Progress Notes (Signed)
Received a fax from [facility] requesting refill on [name]. Original prescription was not received on 9/30. Spoke with pharmacist who was able to take a prescription over the phone. Pharmacist was able to confirm medication, dosage, quantity and refills.  [facility]  Rx: [phone]. [place]

## 2018-06-05 DIAGNOSIS — J301 Allergic rhinitis due to pollen: Secondary | ICD-10-CM | POA: Diagnosis not present

## 2018-06-05 DIAGNOSIS — J3089 Other allergic rhinitis: Secondary | ICD-10-CM | POA: Diagnosis not present

## 2018-06-05 DIAGNOSIS — J3081 Allergic rhinitis due to animal (cat) (dog) hair and dander: Secondary | ICD-10-CM | POA: Diagnosis not present

## 2018-06-12 DIAGNOSIS — J3081 Allergic rhinitis due to animal (cat) (dog) hair and dander: Secondary | ICD-10-CM | POA: Diagnosis not present

## 2018-06-12 DIAGNOSIS — J301 Allergic rhinitis due to pollen: Secondary | ICD-10-CM | POA: Diagnosis not present

## 2018-06-12 DIAGNOSIS — J3089 Other allergic rhinitis: Secondary | ICD-10-CM | POA: Diagnosis not present

## 2018-06-26 DIAGNOSIS — J3081 Allergic rhinitis due to animal (cat) (dog) hair and dander: Secondary | ICD-10-CM | POA: Diagnosis not present

## 2018-06-26 DIAGNOSIS — J3089 Other allergic rhinitis: Secondary | ICD-10-CM | POA: Diagnosis not present

## 2018-06-26 DIAGNOSIS — J301 Allergic rhinitis due to pollen: Secondary | ICD-10-CM | POA: Diagnosis not present

## 2018-07-03 DIAGNOSIS — J301 Allergic rhinitis due to pollen: Secondary | ICD-10-CM | POA: Diagnosis not present

## 2018-07-03 DIAGNOSIS — J3081 Allergic rhinitis due to animal (cat) (dog) hair and dander: Secondary | ICD-10-CM | POA: Diagnosis not present

## 2018-07-03 DIAGNOSIS — J3089 Other allergic rhinitis: Secondary | ICD-10-CM | POA: Diagnosis not present

## 2018-07-15 ENCOUNTER — Other Ambulatory Visit: Payer: Self-pay | Admitting: Infectious Diseases

## 2018-07-15 DIAGNOSIS — I10 Essential (primary) hypertension: Secondary | ICD-10-CM

## 2018-07-16 DIAGNOSIS — J3089 Other allergic rhinitis: Secondary | ICD-10-CM | POA: Diagnosis not present

## 2018-07-16 DIAGNOSIS — J301 Allergic rhinitis due to pollen: Secondary | ICD-10-CM | POA: Diagnosis not present

## 2018-07-16 DIAGNOSIS — J3081 Allergic rhinitis due to animal (cat) (dog) hair and dander: Secondary | ICD-10-CM | POA: Diagnosis not present

## 2018-08-09 DIAGNOSIS — R05 Cough: Secondary | ICD-10-CM | POA: Diagnosis not present

## 2018-08-09 DIAGNOSIS — J3089 Other allergic rhinitis: Secondary | ICD-10-CM | POA: Diagnosis not present

## 2018-08-09 DIAGNOSIS — J301 Allergic rhinitis due to pollen: Secondary | ICD-10-CM | POA: Diagnosis not present

## 2018-08-09 DIAGNOSIS — J3081 Allergic rhinitis due to animal (cat) (dog) hair and dander: Secondary | ICD-10-CM | POA: Diagnosis not present

## 2018-08-23 DIAGNOSIS — J301 Allergic rhinitis due to pollen: Secondary | ICD-10-CM | POA: Diagnosis not present

## 2018-08-23 DIAGNOSIS — J3089 Other allergic rhinitis: Secondary | ICD-10-CM | POA: Diagnosis not present

## 2018-08-23 DIAGNOSIS — J3081 Allergic rhinitis due to animal (cat) (dog) hair and dander: Secondary | ICD-10-CM | POA: Diagnosis not present

## 2018-08-28 DIAGNOSIS — J3089 Other allergic rhinitis: Secondary | ICD-10-CM | POA: Diagnosis not present

## 2018-08-28 DIAGNOSIS — J301 Allergic rhinitis due to pollen: Secondary | ICD-10-CM | POA: Diagnosis not present

## 2018-08-28 DIAGNOSIS — J3081 Allergic rhinitis due to animal (cat) (dog) hair and dander: Secondary | ICD-10-CM | POA: Diagnosis not present

## 2018-09-06 DIAGNOSIS — J3081 Allergic rhinitis due to animal (cat) (dog) hair and dander: Secondary | ICD-10-CM | POA: Diagnosis not present

## 2018-09-06 DIAGNOSIS — J301 Allergic rhinitis due to pollen: Secondary | ICD-10-CM | POA: Diagnosis not present

## 2018-09-06 DIAGNOSIS — J3089 Other allergic rhinitis: Secondary | ICD-10-CM | POA: Diagnosis not present

## 2018-09-20 DIAGNOSIS — J3089 Other allergic rhinitis: Secondary | ICD-10-CM | POA: Diagnosis not present

## 2018-09-20 DIAGNOSIS — J301 Allergic rhinitis due to pollen: Secondary | ICD-10-CM | POA: Diagnosis not present

## 2018-09-20 DIAGNOSIS — J3081 Allergic rhinitis due to animal (cat) (dog) hair and dander: Secondary | ICD-10-CM | POA: Diagnosis not present

## 2018-09-25 DIAGNOSIS — J3089 Other allergic rhinitis: Secondary | ICD-10-CM | POA: Diagnosis not present

## 2018-09-25 DIAGNOSIS — J3081 Allergic rhinitis due to animal (cat) (dog) hair and dander: Secondary | ICD-10-CM | POA: Diagnosis not present

## 2018-09-25 DIAGNOSIS — J301 Allergic rhinitis due to pollen: Secondary | ICD-10-CM | POA: Diagnosis not present

## 2018-09-27 DIAGNOSIS — J301 Allergic rhinitis due to pollen: Secondary | ICD-10-CM | POA: Diagnosis not present

## 2018-09-27 DIAGNOSIS — J3081 Allergic rhinitis due to animal (cat) (dog) hair and dander: Secondary | ICD-10-CM | POA: Diagnosis not present

## 2018-09-27 DIAGNOSIS — J3089 Other allergic rhinitis: Secondary | ICD-10-CM | POA: Diagnosis not present

## 2018-10-02 DIAGNOSIS — J301 Allergic rhinitis due to pollen: Secondary | ICD-10-CM | POA: Diagnosis not present

## 2018-10-02 DIAGNOSIS — J3081 Allergic rhinitis due to animal (cat) (dog) hair and dander: Secondary | ICD-10-CM | POA: Diagnosis not present

## 2018-10-02 DIAGNOSIS — J3089 Other allergic rhinitis: Secondary | ICD-10-CM | POA: Diagnosis not present

## 2018-10-04 DIAGNOSIS — J301 Allergic rhinitis due to pollen: Secondary | ICD-10-CM | POA: Diagnosis not present

## 2018-10-04 DIAGNOSIS — J3081 Allergic rhinitis due to animal (cat) (dog) hair and dander: Secondary | ICD-10-CM | POA: Diagnosis not present

## 2018-10-04 DIAGNOSIS — J3089 Other allergic rhinitis: Secondary | ICD-10-CM | POA: Diagnosis not present

## 2018-10-07 DIAGNOSIS — J3089 Other allergic rhinitis: Secondary | ICD-10-CM | POA: Diagnosis not present

## 2018-10-07 DIAGNOSIS — J301 Allergic rhinitis due to pollen: Secondary | ICD-10-CM | POA: Diagnosis not present

## 2018-10-07 DIAGNOSIS — J3081 Allergic rhinitis due to animal (cat) (dog) hair and dander: Secondary | ICD-10-CM | POA: Diagnosis not present

## 2018-10-09 DIAGNOSIS — J301 Allergic rhinitis due to pollen: Secondary | ICD-10-CM | POA: Diagnosis not present

## 2018-10-09 DIAGNOSIS — J3089 Other allergic rhinitis: Secondary | ICD-10-CM | POA: Diagnosis not present

## 2018-10-09 DIAGNOSIS — J3081 Allergic rhinitis due to animal (cat) (dog) hair and dander: Secondary | ICD-10-CM | POA: Diagnosis not present

## 2018-10-11 DIAGNOSIS — J3089 Other allergic rhinitis: Secondary | ICD-10-CM | POA: Diagnosis not present

## 2018-10-11 DIAGNOSIS — J3081 Allergic rhinitis due to animal (cat) (dog) hair and dander: Secondary | ICD-10-CM | POA: Diagnosis not present

## 2018-10-11 DIAGNOSIS — J301 Allergic rhinitis due to pollen: Secondary | ICD-10-CM | POA: Diagnosis not present

## 2018-10-16 DIAGNOSIS — J3081 Allergic rhinitis due to animal (cat) (dog) hair and dander: Secondary | ICD-10-CM | POA: Diagnosis not present

## 2018-10-16 DIAGNOSIS — J301 Allergic rhinitis due to pollen: Secondary | ICD-10-CM | POA: Diagnosis not present

## 2018-10-16 DIAGNOSIS — J3089 Other allergic rhinitis: Secondary | ICD-10-CM | POA: Diagnosis not present

## 2018-10-23 DIAGNOSIS — J301 Allergic rhinitis due to pollen: Secondary | ICD-10-CM | POA: Diagnosis not present

## 2018-10-23 DIAGNOSIS — J3089 Other allergic rhinitis: Secondary | ICD-10-CM | POA: Diagnosis not present

## 2018-10-23 DIAGNOSIS — J3081 Allergic rhinitis due to animal (cat) (dog) hair and dander: Secondary | ICD-10-CM | POA: Diagnosis not present

## 2018-10-25 DIAGNOSIS — J3089 Other allergic rhinitis: Secondary | ICD-10-CM | POA: Diagnosis not present

## 2018-10-25 DIAGNOSIS — J3081 Allergic rhinitis due to animal (cat) (dog) hair and dander: Secondary | ICD-10-CM | POA: Diagnosis not present

## 2018-10-25 DIAGNOSIS — J301 Allergic rhinitis due to pollen: Secondary | ICD-10-CM | POA: Diagnosis not present

## 2018-10-30 DIAGNOSIS — J3089 Other allergic rhinitis: Secondary | ICD-10-CM | POA: Diagnosis not present

## 2018-10-30 DIAGNOSIS — J301 Allergic rhinitis due to pollen: Secondary | ICD-10-CM | POA: Diagnosis not present

## 2018-10-30 DIAGNOSIS — J3081 Allergic rhinitis due to animal (cat) (dog) hair and dander: Secondary | ICD-10-CM | POA: Diagnosis not present

## 2018-11-06 DIAGNOSIS — J301 Allergic rhinitis due to pollen: Secondary | ICD-10-CM | POA: Diagnosis not present

## 2018-11-06 DIAGNOSIS — J3089 Other allergic rhinitis: Secondary | ICD-10-CM | POA: Diagnosis not present

## 2018-11-06 DIAGNOSIS — J3081 Allergic rhinitis due to animal (cat) (dog) hair and dander: Secondary | ICD-10-CM | POA: Diagnosis not present

## 2018-11-13 DIAGNOSIS — J3089 Other allergic rhinitis: Secondary | ICD-10-CM | POA: Diagnosis not present

## 2018-11-13 DIAGNOSIS — J3081 Allergic rhinitis due to animal (cat) (dog) hair and dander: Secondary | ICD-10-CM | POA: Diagnosis not present

## 2018-11-13 DIAGNOSIS — J301 Allergic rhinitis due to pollen: Secondary | ICD-10-CM | POA: Diagnosis not present

## 2018-11-15 DIAGNOSIS — J3089 Other allergic rhinitis: Secondary | ICD-10-CM | POA: Diagnosis not present

## 2018-11-15 DIAGNOSIS — J3081 Allergic rhinitis due to animal (cat) (dog) hair and dander: Secondary | ICD-10-CM | POA: Diagnosis not present

## 2018-11-15 DIAGNOSIS — J301 Allergic rhinitis due to pollen: Secondary | ICD-10-CM | POA: Diagnosis not present

## 2018-11-20 DIAGNOSIS — J301 Allergic rhinitis due to pollen: Secondary | ICD-10-CM | POA: Diagnosis not present

## 2018-11-20 DIAGNOSIS — J3081 Allergic rhinitis due to animal (cat) (dog) hair and dander: Secondary | ICD-10-CM | POA: Diagnosis not present

## 2018-11-20 DIAGNOSIS — J3089 Other allergic rhinitis: Secondary | ICD-10-CM | POA: Diagnosis not present

## 2018-11-22 DIAGNOSIS — J3089 Other allergic rhinitis: Secondary | ICD-10-CM | POA: Diagnosis not present

## 2018-11-22 DIAGNOSIS — J3081 Allergic rhinitis due to animal (cat) (dog) hair and dander: Secondary | ICD-10-CM | POA: Diagnosis not present

## 2018-11-22 DIAGNOSIS — J301 Allergic rhinitis due to pollen: Secondary | ICD-10-CM | POA: Diagnosis not present

## 2018-11-29 DIAGNOSIS — J3089 Other allergic rhinitis: Secondary | ICD-10-CM | POA: Diagnosis not present

## 2018-11-29 DIAGNOSIS — J301 Allergic rhinitis due to pollen: Secondary | ICD-10-CM | POA: Diagnosis not present

## 2018-11-29 DIAGNOSIS — J3081 Allergic rhinitis due to animal (cat) (dog) hair and dander: Secondary | ICD-10-CM | POA: Diagnosis not present

## 2018-12-06 ENCOUNTER — Ambulatory Visit: Payer: BLUE CROSS/BLUE SHIELD | Admitting: Infectious Diseases

## 2018-12-06 DIAGNOSIS — J301 Allergic rhinitis due to pollen: Secondary | ICD-10-CM | POA: Diagnosis not present

## 2018-12-06 DIAGNOSIS — J3081 Allergic rhinitis due to animal (cat) (dog) hair and dander: Secondary | ICD-10-CM | POA: Diagnosis not present

## 2018-12-06 DIAGNOSIS — J3089 Other allergic rhinitis: Secondary | ICD-10-CM | POA: Diagnosis not present

## 2018-12-08 DIAGNOSIS — E291 Testicular hypofunction: Secondary | ICD-10-CM | POA: Diagnosis not present

## 2018-12-08 DIAGNOSIS — E559 Vitamin D deficiency, unspecified: Secondary | ICD-10-CM | POA: Diagnosis not present

## 2018-12-08 DIAGNOSIS — R350 Frequency of micturition: Secondary | ICD-10-CM | POA: Diagnosis not present

## 2018-12-08 DIAGNOSIS — R0602 Shortness of breath: Secondary | ICD-10-CM | POA: Diagnosis not present

## 2018-12-08 DIAGNOSIS — Z79899 Other long term (current) drug therapy: Secondary | ICD-10-CM | POA: Diagnosis not present

## 2018-12-08 DIAGNOSIS — R5383 Other fatigue: Secondary | ICD-10-CM | POA: Diagnosis not present

## 2018-12-08 DIAGNOSIS — E8881 Metabolic syndrome: Secondary | ICD-10-CM | POA: Diagnosis not present

## 2018-12-08 DIAGNOSIS — Z131 Encounter for screening for diabetes mellitus: Secondary | ICD-10-CM | POA: Diagnosis not present

## 2018-12-08 DIAGNOSIS — R635 Abnormal weight gain: Secondary | ICD-10-CM | POA: Diagnosis not present

## 2018-12-08 DIAGNOSIS — E7800 Pure hypercholesterolemia, unspecified: Secondary | ICD-10-CM | POA: Diagnosis not present

## 2018-12-12 ENCOUNTER — Ambulatory Visit: Payer: BLUE CROSS/BLUE SHIELD | Admitting: Infectious Diseases

## 2018-12-13 ENCOUNTER — Ambulatory Visit: Payer: BLUE CROSS/BLUE SHIELD | Admitting: Infectious Diseases

## 2018-12-13 DIAGNOSIS — J301 Allergic rhinitis due to pollen: Secondary | ICD-10-CM | POA: Diagnosis not present

## 2018-12-13 DIAGNOSIS — J3081 Allergic rhinitis due to animal (cat) (dog) hair and dander: Secondary | ICD-10-CM | POA: Diagnosis not present

## 2018-12-13 DIAGNOSIS — J3089 Other allergic rhinitis: Secondary | ICD-10-CM | POA: Diagnosis not present

## 2018-12-15 ENCOUNTER — Other Ambulatory Visit: Payer: Self-pay | Admitting: Infectious Diseases

## 2018-12-15 DIAGNOSIS — I10 Essential (primary) hypertension: Secondary | ICD-10-CM

## 2018-12-27 DIAGNOSIS — J301 Allergic rhinitis due to pollen: Secondary | ICD-10-CM | POA: Diagnosis not present

## 2018-12-27 DIAGNOSIS — J3089 Other allergic rhinitis: Secondary | ICD-10-CM | POA: Diagnosis not present

## 2018-12-27 DIAGNOSIS — J3081 Allergic rhinitis due to animal (cat) (dog) hair and dander: Secondary | ICD-10-CM | POA: Diagnosis not present

## 2019-01-03 DIAGNOSIS — J301 Allergic rhinitis due to pollen: Secondary | ICD-10-CM | POA: Diagnosis not present

## 2019-01-03 DIAGNOSIS — J3081 Allergic rhinitis due to animal (cat) (dog) hair and dander: Secondary | ICD-10-CM | POA: Diagnosis not present

## 2019-01-03 DIAGNOSIS — J3089 Other allergic rhinitis: Secondary | ICD-10-CM | POA: Diagnosis not present

## 2019-01-06 DIAGNOSIS — J3089 Other allergic rhinitis: Secondary | ICD-10-CM | POA: Diagnosis not present

## 2019-01-06 DIAGNOSIS — J3081 Allergic rhinitis due to animal (cat) (dog) hair and dander: Secondary | ICD-10-CM | POA: Diagnosis not present

## 2019-01-10 DIAGNOSIS — E559 Vitamin D deficiency, unspecified: Secondary | ICD-10-CM | POA: Diagnosis not present

## 2019-01-10 DIAGNOSIS — J3089 Other allergic rhinitis: Secondary | ICD-10-CM | POA: Diagnosis not present

## 2019-01-10 DIAGNOSIS — Z1159 Encounter for screening for other viral diseases: Secondary | ICD-10-CM | POA: Diagnosis not present

## 2019-01-10 DIAGNOSIS — E7800 Pure hypercholesterolemia, unspecified: Secondary | ICD-10-CM | POA: Diagnosis not present

## 2019-01-10 DIAGNOSIS — J301 Allergic rhinitis due to pollen: Secondary | ICD-10-CM | POA: Diagnosis not present

## 2019-01-10 DIAGNOSIS — J3081 Allergic rhinitis due to animal (cat) (dog) hair and dander: Secondary | ICD-10-CM | POA: Diagnosis not present

## 2019-01-10 DIAGNOSIS — R635 Abnormal weight gain: Secondary | ICD-10-CM | POA: Diagnosis not present

## 2019-01-15 DIAGNOSIS — J301 Allergic rhinitis due to pollen: Secondary | ICD-10-CM | POA: Diagnosis not present

## 2019-01-15 DIAGNOSIS — J3081 Allergic rhinitis due to animal (cat) (dog) hair and dander: Secondary | ICD-10-CM | POA: Diagnosis not present

## 2019-01-15 DIAGNOSIS — J3089 Other allergic rhinitis: Secondary | ICD-10-CM | POA: Diagnosis not present

## 2019-01-17 DIAGNOSIS — J301 Allergic rhinitis due to pollen: Secondary | ICD-10-CM | POA: Diagnosis not present

## 2019-01-17 DIAGNOSIS — J3089 Other allergic rhinitis: Secondary | ICD-10-CM | POA: Diagnosis not present

## 2019-01-17 DIAGNOSIS — J3081 Allergic rhinitis due to animal (cat) (dog) hair and dander: Secondary | ICD-10-CM | POA: Diagnosis not present

## 2019-01-22 DIAGNOSIS — J3089 Other allergic rhinitis: Secondary | ICD-10-CM | POA: Diagnosis not present

## 2019-01-22 DIAGNOSIS — J301 Allergic rhinitis due to pollen: Secondary | ICD-10-CM | POA: Diagnosis not present

## 2019-01-22 DIAGNOSIS — J3081 Allergic rhinitis due to animal (cat) (dog) hair and dander: Secondary | ICD-10-CM | POA: Diagnosis not present

## 2019-01-29 DIAGNOSIS — J3081 Allergic rhinitis due to animal (cat) (dog) hair and dander: Secondary | ICD-10-CM | POA: Diagnosis not present

## 2019-01-29 DIAGNOSIS — J301 Allergic rhinitis due to pollen: Secondary | ICD-10-CM | POA: Diagnosis not present

## 2019-01-29 DIAGNOSIS — J3089 Other allergic rhinitis: Secondary | ICD-10-CM | POA: Diagnosis not present

## 2019-01-31 DIAGNOSIS — J3081 Allergic rhinitis due to animal (cat) (dog) hair and dander: Secondary | ICD-10-CM | POA: Diagnosis not present

## 2019-01-31 DIAGNOSIS — J301 Allergic rhinitis due to pollen: Secondary | ICD-10-CM | POA: Diagnosis not present

## 2019-01-31 DIAGNOSIS — J3089 Other allergic rhinitis: Secondary | ICD-10-CM | POA: Diagnosis not present

## 2019-02-05 DIAGNOSIS — J3089 Other allergic rhinitis: Secondary | ICD-10-CM | POA: Diagnosis not present

## 2019-02-05 DIAGNOSIS — J301 Allergic rhinitis due to pollen: Secondary | ICD-10-CM | POA: Diagnosis not present

## 2019-02-05 DIAGNOSIS — J3081 Allergic rhinitis due to animal (cat) (dog) hair and dander: Secondary | ICD-10-CM | POA: Diagnosis not present

## 2019-02-07 DIAGNOSIS — E559 Vitamin D deficiency, unspecified: Secondary | ICD-10-CM | POA: Diagnosis not present

## 2019-02-07 DIAGNOSIS — E7800 Pure hypercholesterolemia, unspecified: Secondary | ICD-10-CM | POA: Diagnosis not present

## 2019-02-07 DIAGNOSIS — R635 Abnormal weight gain: Secondary | ICD-10-CM | POA: Diagnosis not present

## 2019-02-12 DIAGNOSIS — J301 Allergic rhinitis due to pollen: Secondary | ICD-10-CM | POA: Diagnosis not present

## 2019-02-12 DIAGNOSIS — J3081 Allergic rhinitis due to animal (cat) (dog) hair and dander: Secondary | ICD-10-CM | POA: Diagnosis not present

## 2019-02-12 DIAGNOSIS — J3089 Other allergic rhinitis: Secondary | ICD-10-CM | POA: Diagnosis not present

## 2019-02-19 DIAGNOSIS — J301 Allergic rhinitis due to pollen: Secondary | ICD-10-CM | POA: Diagnosis not present

## 2019-02-19 DIAGNOSIS — J3089 Other allergic rhinitis: Secondary | ICD-10-CM | POA: Diagnosis not present

## 2019-02-19 DIAGNOSIS — J3081 Allergic rhinitis due to animal (cat) (dog) hair and dander: Secondary | ICD-10-CM | POA: Diagnosis not present

## 2019-02-20 ENCOUNTER — Other Ambulatory Visit: Payer: Self-pay

## 2019-02-20 ENCOUNTER — Other Ambulatory Visit (HOSPITAL_COMMUNITY)
Admission: RE | Admit: 2019-02-20 | Discharge: 2019-02-20 | Disposition: A | Discharge status: Home / Self Care | Payer: BC Managed Care – PPO | Source: Ambulatory Visit | Attending: Infectious Diseases | Admitting: Infectious Diseases

## 2019-02-20 ENCOUNTER — Encounter: Payer: Self-pay | Admitting: Infectious Diseases

## 2019-02-20 ENCOUNTER — Ambulatory Visit (INDEPENDENT_AMBULATORY_CARE_PROVIDER_SITE_OTHER): Payer: BC Managed Care – PPO | Admitting: Infectious Diseases

## 2019-02-20 VITALS — BP 104/68 | HR 120 | Temp 99.1°F

## 2019-02-20 DIAGNOSIS — H6693 Otitis media, unspecified, bilateral: Secondary | ICD-10-CM

## 2019-02-20 DIAGNOSIS — Z23 Encounter for immunization: Secondary | ICD-10-CM | POA: Diagnosis not present

## 2019-02-20 DIAGNOSIS — Z79899 Other long term (current) drug therapy: Secondary | ICD-10-CM

## 2019-02-20 DIAGNOSIS — N189 Chronic kidney disease, unspecified: Secondary | ICD-10-CM | POA: Insufficient documentation

## 2019-02-20 DIAGNOSIS — Z21 Asymptomatic human immunodeficiency virus [HIV] infection status: Secondary | ICD-10-CM

## 2019-02-20 DIAGNOSIS — N1831 Chronic kidney disease, stage 3a: Secondary | ICD-10-CM | POA: Insufficient documentation

## 2019-02-20 DIAGNOSIS — B20 Human immunodeficiency virus [HIV] disease: Secondary | ICD-10-CM

## 2019-02-20 DIAGNOSIS — H6690 Otitis media, unspecified, unspecified ear: Secondary | ICD-10-CM | POA: Insufficient documentation

## 2019-02-20 DIAGNOSIS — I10 Essential (primary) hypertension: Secondary | ICD-10-CM | POA: Diagnosis not present

## 2019-02-20 DIAGNOSIS — N183 Chronic kidney disease, stage 3 unspecified: Secondary | ICD-10-CM

## 2019-02-20 DIAGNOSIS — Z113 Encounter for screening for infections with a predominantly sexual mode of transmission: Secondary | ICD-10-CM

## 2019-02-20 DIAGNOSIS — N182 Chronic kidney disease, stage 2 (mild): Secondary | ICD-10-CM

## 2019-02-20 HISTORY — DX: Chronic kidney disease, stage 3 unspecified: N18.30

## 2019-02-20 MED ORDER — PHENTERMINE HCL 30 MG PO CAPS: 30.0000 mg | ORAL_CAPSULE | ORAL | Status: DC

## 2019-02-20 NOTE — Progress Notes (Signed)
   Subjective:    Patient ID: Lee [name], male    DOB: 03-23-1981, 38 y.o.   MRN: [id number]  HPI 38yo M with hx of HTN and HIV+ dx 08-03-16.  He had first visit 2-15 and was also found to have syphilis. He received IM PEN G on 09-14-16.  His genotype is naive. He is HLA negative.  Has been on triumeq but at 5-31 he had increased LFTs and Cr on labs. He had repeat labs done on 6-8 which confirmed this. He was seen in f/u with pharm 02-2017 and was changed to biktarvy.   Has been feeling well.  Denies missed ART. No problems with ART. Was seen by MD for wt loss ( now on phentiramine) after getting up to 328. now at 301. Has not gone back to gym yet.   HIV 1 RNA Quant (copies/mL)  Date Value  02/21/2018 28 (H)  07/26/2017 <20 NOT DETECTED  03/09/2017 23 (H)   CD4 T Cell Abs (/uL)  Date Value  02/21/2018 600  07/26/2017 570  12/21/2016 430     Review of Systems  Constitutional: Negative for appetite change and unexpected weight change.  HENT: Positive for ear discharge. Negative for hearing loss.   Respiratory: Negative for cough and shortness of breath.   Gastrointestinal: Negative for constipation and diarrhea.  Genitourinary: Negative for difficulty urinating.  Psychiatric/Behavioral: Negative for sleep disturbance.  wears seat belt.  Please see HPI. All other systems reviewed and negative.      Objective:   Physical Exam Constitutional:      Appearance: Normal appearance.  HENT:     Mouth/Throat:     Mouth: Mucous membranes are moist.     Pharynx: No oropharyngeal exudate.  Eyes:     Extraocular Movements: Extraocular movements intact.     Pupils: Pupils are equal, round, and reactive to light.  Neck:     Musculoskeletal: Normal range of motion and neck supple.  Cardiovascular:     Rate and Rhythm: Rhythm irregular.  Pulmonary:     Effort: Pulmonary effort is normal.     Breath sounds: Normal breath sounds.  Abdominal:     General: Bowel sounds are  normal.     Palpations: Abdomen is soft.  Musculoskeletal:     Right lower leg: No edema.     Left lower leg: No edema.  Neurological:     General: No focal deficit present.     Mental Status: He is alert and oriented to person, place, and time.  Psychiatric:        Mood and Affect: Mood normal.        Assessment & Plan:

## 2019-02-20 NOTE — Assessment & Plan Note (Signed)
Continues on phentiramine by specialist.  Encouraged him!

## 2019-02-20 NOTE — Assessment & Plan Note (Signed)
Will f/u with todays labs.  May need renal eval Needs to see PCP

## 2019-02-20 NOTE — Assessment & Plan Note (Signed)
He is doing well  Not sexually active.  Given condoms Continue biktarvy Check labs today.  pcv 13 today tdap today I retook his pulse, regular with occas pause.  rtc in 9 months.

## 2019-02-20 NOTE — Assessment & Plan Note (Signed)
States related to allergies.  He is getting allergy injections and has somewhat improved He is going to f/u with ENT.

## 2019-02-20 NOTE — Assessment & Plan Note (Signed)
Well controlled on dual rx.  Will continue to watch Check Cr today.

## 2019-02-20 NOTE — Addendum Note (Signed)
Addended by: Lenore Cordia on: 02/20/2019 04:52 PM   Modules accepted: Orders

## 2019-02-21 LAB — T-HELPER CELL (CD4) - (RCID CLINIC ONLY)
CD4 % Helper T Cell: 35 % (ref 33–65)
CD4 T Cell Abs: 524 /uL (ref 400–1790)

## 2019-02-22 LAB — URINE CYTOLOGY ANCILLARY ONLY
Chlamydia: NEGATIVE
Neisseria Gonorrhea: NEGATIVE

## 2019-02-24 ENCOUNTER — Other Ambulatory Visit: Payer: Self-pay | Admitting: Infectious Diseases

## 2019-02-24 DIAGNOSIS — B20 Human immunodeficiency virus [HIV] disease: Secondary | ICD-10-CM

## 2019-02-26 DIAGNOSIS — J3081 Allergic rhinitis due to animal (cat) (dog) hair and dander: Secondary | ICD-10-CM | POA: Diagnosis not present

## 2019-02-26 DIAGNOSIS — J301 Allergic rhinitis due to pollen: Secondary | ICD-10-CM | POA: Diagnosis not present

## 2019-02-26 DIAGNOSIS — J3089 Other allergic rhinitis: Secondary | ICD-10-CM | POA: Diagnosis not present

## 2019-02-26 IMAGING — US US ABDOMEN COMPLETE
1 series · 13 of 25 positions shown · non-contrast
Comparison: CT abdomen and pelvis December 13, 2009

CLINICAL DATA: Hepatitis.  Increase cm creatinine.

EXAM:
ABDOMEN ULTRASOUND COMPLETE

[Series 1: us abdomen complete · 0.23mm/px · 13 of 113 slices shown]
[im 1/113]
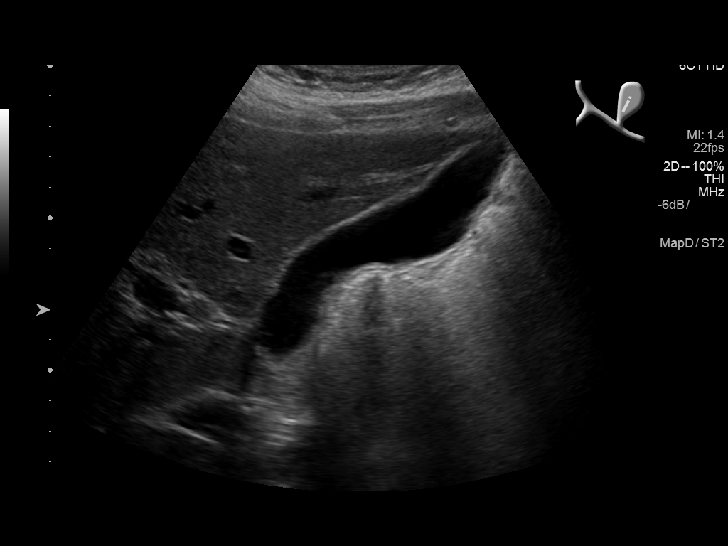
[im 10/113]
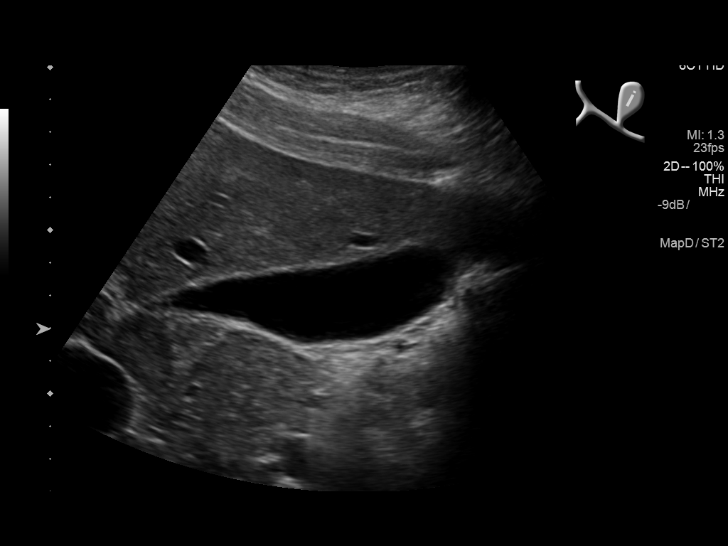
[im 19/113]
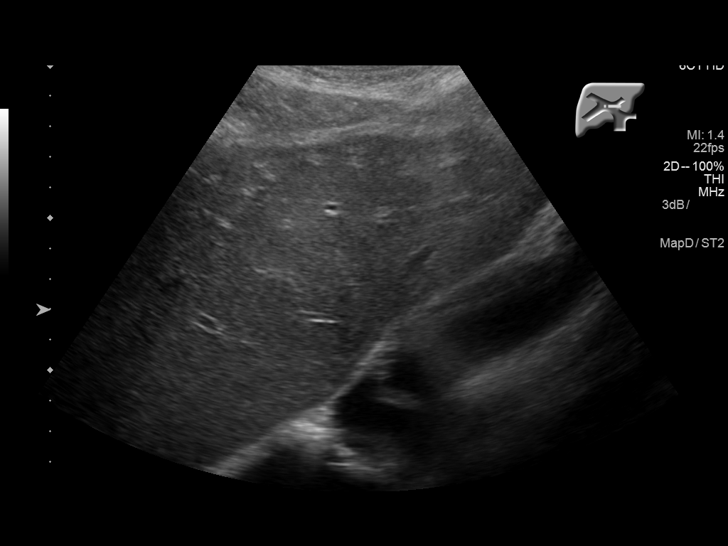
[im 29/113]
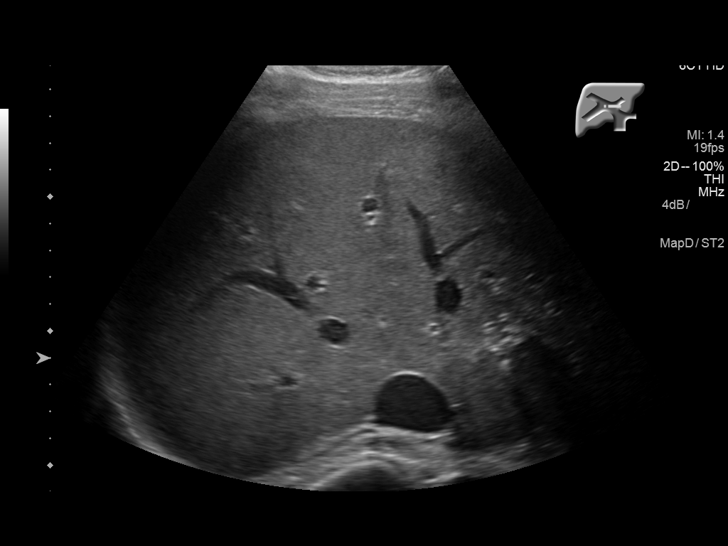
[im 38/113]
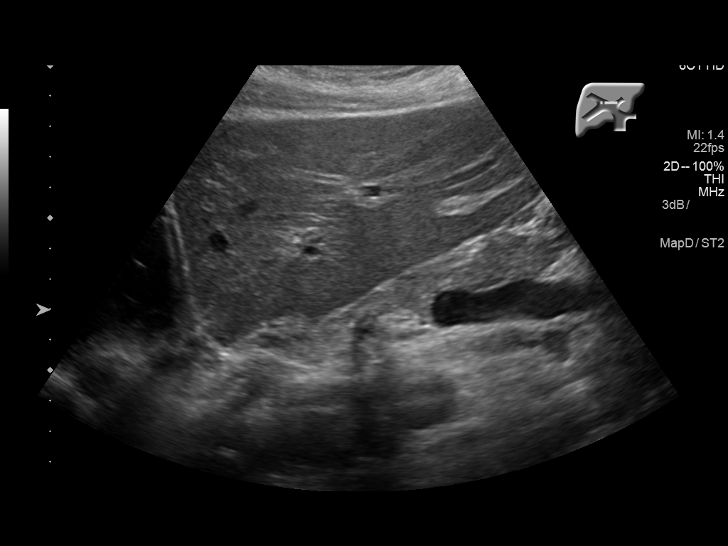
[im 47/113]
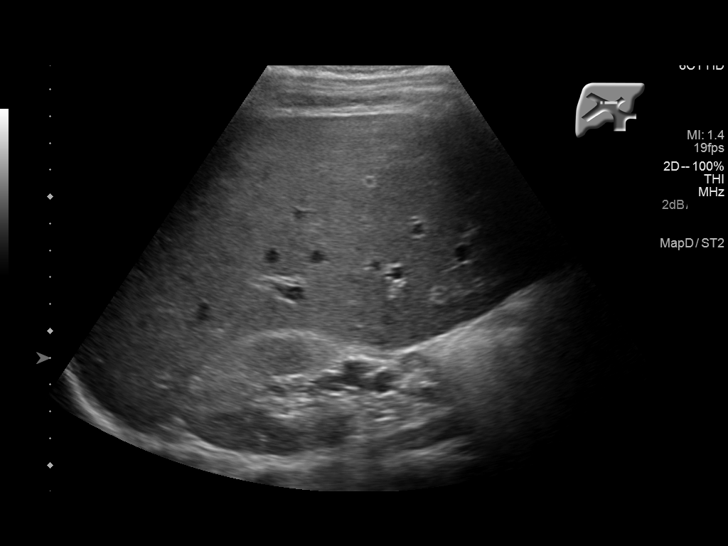
[im 57/113]
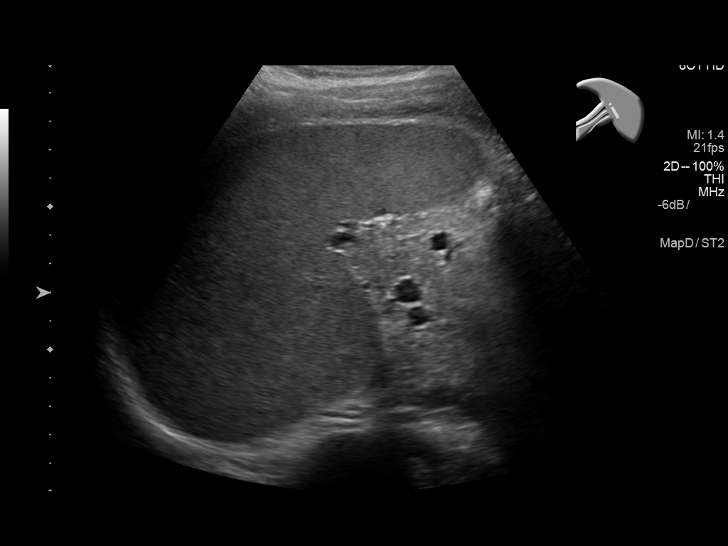
[im 66/113]
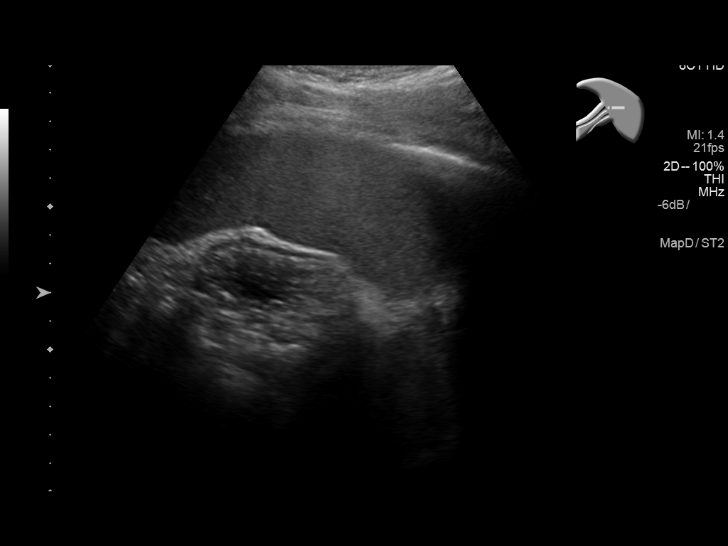
[im 75/113]
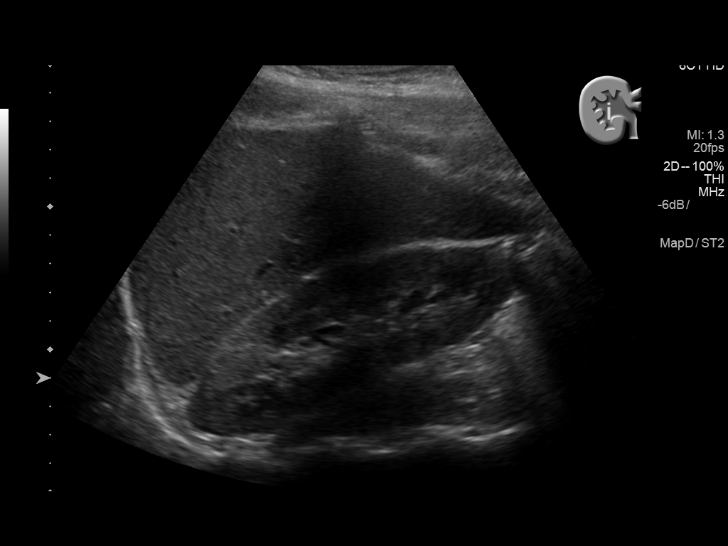
[im 85/113]
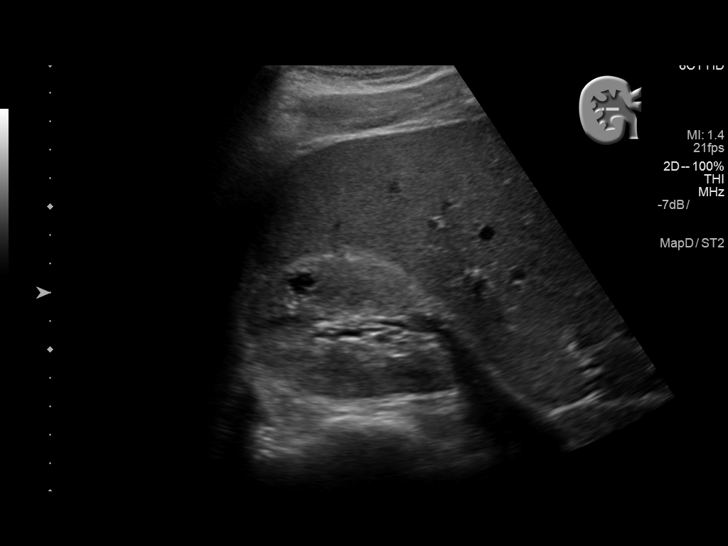
[im 94/113]
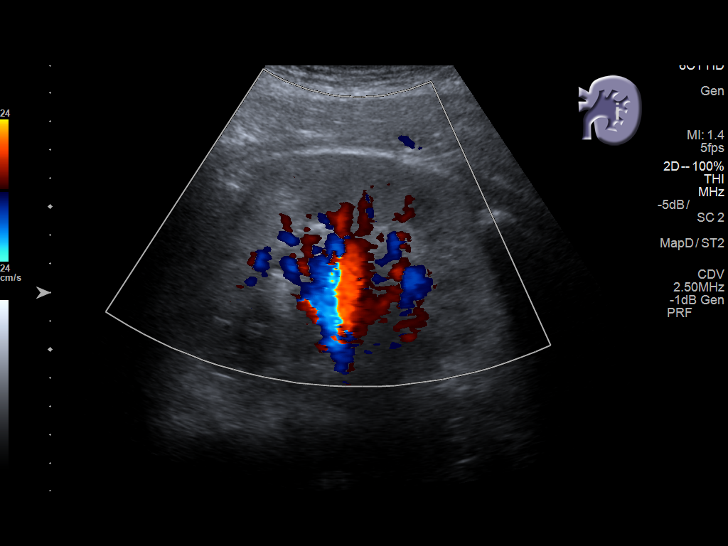
[im 103/113]
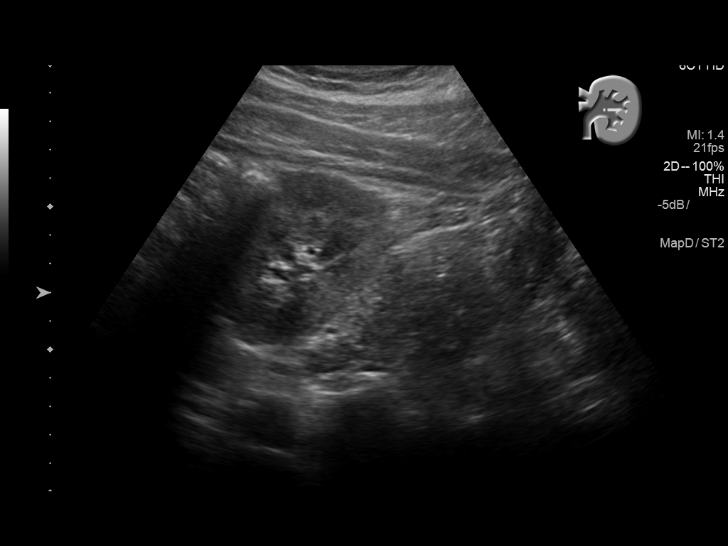
[im 113/113]
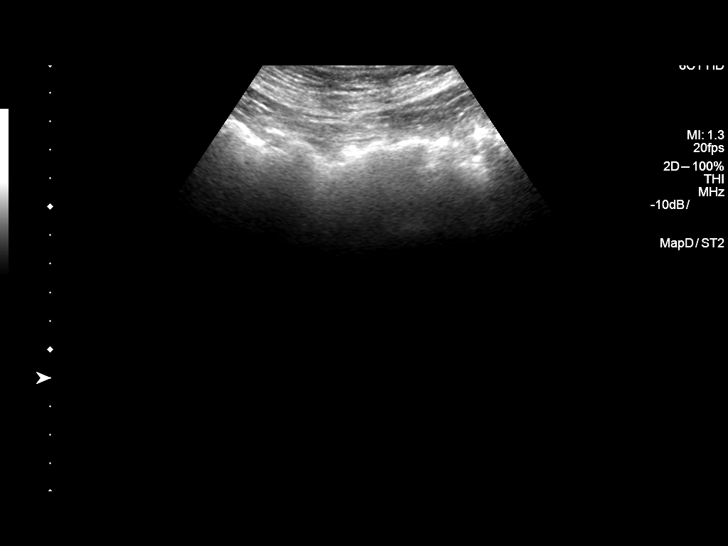

[13 of 25 positions shown; findings below may reference images not displayed]

FINDINGS: Gallbladder: No gallstones or wall thickening visualized. There is
no pericholecystic fluid. No sonographic Murphy sign noted by
sonographer.

Common bile duct: Diameter: 3 mm. There is no intrahepatic, common
hepatic, or common bile duct dilatation.

Liver: No focal lesion identified. Within normal limits in
parenchymal echogenicity.

IVC: No abnormality visualized.

Pancreas: No pancreatic mass or inflammatory focus.

Spleen: Spleen measures 13.2 x 9.7 x 14.5 cm with a measured splenic
volume of 975 cubic cm. No focal splenic lesions are evident.

Right Kidney: Length: 12.6 cm. Echogenicity is increased. Renal
cortical thickness within normal limits. No perinephric fluid or
hydronephrosis visualized. There is a cyst in the upper pole region
measuring 0.9 x 0.8 x 1.0 cm.

Left Kidney: Length: 12.5 cm. Echogenicity is increased. Renal
cortical thickness within normal limits. No mass, perinephric fluid,
or hydronephrosis visualized.

Abdominal aorta: No aneurysm appreciable in visualized regions.
Portions of the distal aorta obscured by gas.

Other findings: No appreciable ascites.
IMPRESSION: 1.  Splenomegaly.  No focal splenic lesions.

2. Each kidney shows increased renal echogenicity, a finding likely
indicative of medical renal disease. No obstructing focus in either
kidney. Renal cortical thickness normal bilaterally.

3.  Small right renal cyst.

4. Portions of abdominal aorta obscured by gas. Visualized portions
of aorta within normal limits.

5.  Study otherwise unremarkable.

## 2019-02-28 LAB — COMPREHENSIVE METABOLIC PANEL
AG Ratio: 1.1 (calc) (ref 1.0–2.5)
ALT: 52 U/L — ABNORMAL HIGH (ref 9–46)
AST: 37 U/L (ref 10–40)
Albumin: 4.3 g/dL (ref 3.6–5.1)
Alkaline phosphatase (APISO): 71 U/L (ref 36–130)
BUN/Creatinine Ratio: 13 (calc) (ref 6–22)
BUN: 23 mg/dL (ref 7–25)
CO2: 24 mmol/L (ref 20–32)
Calcium: 9.8 mg/dL (ref 8.6–10.3)
Chloride: 107 mmol/L (ref 98–110)
Creat: 1.75 mg/dL — ABNORMAL HIGH (ref 0.60–1.35)
Globulin: 3.9 g/dL (calc) — ABNORMAL HIGH (ref 1.9–3.7)
Glucose, Bld: 88 mg/dL (ref 65–99)
Potassium: 4.1 mmol/L (ref 3.5–5.3)
Sodium: 138 mmol/L (ref 135–146)
Total Bilirubin: 1.1 mg/dL (ref 0.2–1.2)
Total Protein: 8.2 g/dL — ABNORMAL HIGH (ref 6.1–8.1)

## 2019-02-28 LAB — CBC
HCT: 46.7 % (ref 38.5–50.0)
Hemoglobin: 16.5 g/dL (ref 13.2–17.1)
MCH: 31.5 pg (ref 27.0–33.0)
MCHC: 35.3 g/dL (ref 32.0–36.0)
MCV: 89.3 fL (ref 80.0–100.0)
MPV: 9.5 fL (ref 7.5–12.5)
Platelets: 229 10*3/uL (ref 140–400)
RBC: 5.23 10*6/uL (ref 4.20–5.80)
RDW: 12.8 % (ref 11.0–15.0)
WBC: 7.1 10*3/uL (ref 3.8–10.8)

## 2019-02-28 LAB — LIPID PANEL
Cholesterol: 193 mg/dL (ref <200)
HDL: 35 mg/dL — ABNORMAL LOW (ref >=40)
LDL Cholesterol (Calc): 137 mg/dL (calc) — ABNORMAL HIGH
Non-HDL Cholesterol (Calc): 158 mg/dL (calc) — ABNORMAL HIGH (ref <130)
Total CHOL/HDL Ratio: 5.5 (calc) — ABNORMAL HIGH (ref <5.0)
Triglycerides: 100 mg/dL (ref <150)

## 2019-02-28 LAB — RPR: RPR Ser Ql: REACTIVE — AB

## 2019-02-28 LAB — RPR TITER: RPR Titer: 1:8 {titer} — ABNORMAL HIGH

## 2019-02-28 LAB — HIV-1 RNA QUANT-NO REFLEX-BLD
HIV 1 RNA Quant: <20 copies/mL — AB
HIV-1 RNA Quant, Log: <1.3 Log copies/mL — AB

## 2019-02-28 LAB — FLUORESCENT TREPONEMAL AB(FTA)-IGG-BLD: Fluorescent Treponemal ABS: REACTIVE — AB

## 2019-03-05 DIAGNOSIS — J3081 Allergic rhinitis due to animal (cat) (dog) hair and dander: Secondary | ICD-10-CM | POA: Diagnosis not present

## 2019-03-05 DIAGNOSIS — J3089 Other allergic rhinitis: Secondary | ICD-10-CM | POA: Diagnosis not present

## 2019-03-05 DIAGNOSIS — J301 Allergic rhinitis due to pollen: Secondary | ICD-10-CM | POA: Diagnosis not present

## 2019-03-08 ENCOUNTER — Other Ambulatory Visit: Payer: Self-pay | Admitting: Infectious Diseases

## 2019-03-08 DIAGNOSIS — I10 Essential (primary) hypertension: Secondary | ICD-10-CM

## 2019-03-10 ENCOUNTER — Telehealth: Payer: Self-pay | Admitting: *Deleted

## 2019-03-10 ENCOUNTER — Other Ambulatory Visit: Payer: Self-pay | Admitting: Infectious Diseases

## 2019-03-10 DIAGNOSIS — N182 Chronic kidney disease, stage 2 (mild): Secondary | ICD-10-CM

## 2019-03-10 NOTE — Telephone Encounter (Signed)
-----   Message from Campbell Riches, MD sent at 03/10/2019  2:04 PM EDT ----- Needs renal eval Order placed thanks ----- Message ----- From: [facility] Lab Results In Sent: 02/21/2019   2:36 AM EDT To: Campbell Riches, MD

## 2019-03-10 NOTE — Telephone Encounter (Signed)
Will forward to Margaret's attention. Landis Gandy, RN

## 2019-03-11 NOTE — Telephone Encounter (Signed)
Thanks everyone

## 2019-03-12 DIAGNOSIS — J301 Allergic rhinitis due to pollen: Secondary | ICD-10-CM | POA: Diagnosis not present

## 2019-03-12 DIAGNOSIS — J3089 Other allergic rhinitis: Secondary | ICD-10-CM | POA: Diagnosis not present

## 2019-03-12 DIAGNOSIS — J3081 Allergic rhinitis due to animal (cat) (dog) hair and dander: Secondary | ICD-10-CM | POA: Diagnosis not present

## 2019-03-14 ENCOUNTER — Ambulatory Visit: Payer: BLUE CROSS/BLUE SHIELD | Admitting: Infectious Diseases

## 2019-03-14 DIAGNOSIS — R5383 Other fatigue: Secondary | ICD-10-CM | POA: Diagnosis not present

## 2019-03-14 DIAGNOSIS — Z131 Encounter for screening for diabetes mellitus: Secondary | ICD-10-CM | POA: Diagnosis not present

## 2019-03-14 DIAGNOSIS — E559 Vitamin D deficiency, unspecified: Secondary | ICD-10-CM | POA: Diagnosis not present

## 2019-03-14 DIAGNOSIS — Z0184 Encounter for antibody response examination: Secondary | ICD-10-CM | POA: Diagnosis not present

## 2019-03-14 DIAGNOSIS — R635 Abnormal weight gain: Secondary | ICD-10-CM | POA: Diagnosis not present

## 2019-03-14 DIAGNOSIS — E7800 Pure hypercholesterolemia, unspecified: Secondary | ICD-10-CM | POA: Diagnosis not present

## 2019-03-19 DIAGNOSIS — J301 Allergic rhinitis due to pollen: Secondary | ICD-10-CM | POA: Diagnosis not present

## 2019-03-19 DIAGNOSIS — J3089 Other allergic rhinitis: Secondary | ICD-10-CM | POA: Diagnosis not present

## 2019-03-19 DIAGNOSIS — J3081 Allergic rhinitis due to animal (cat) (dog) hair and dander: Secondary | ICD-10-CM | POA: Diagnosis not present

## 2019-03-26 DIAGNOSIS — J3089 Other allergic rhinitis: Secondary | ICD-10-CM | POA: Diagnosis not present

## 2019-03-26 DIAGNOSIS — J301 Allergic rhinitis due to pollen: Secondary | ICD-10-CM | POA: Diagnosis not present

## 2019-03-26 DIAGNOSIS — J3081 Allergic rhinitis due to animal (cat) (dog) hair and dander: Secondary | ICD-10-CM | POA: Diagnosis not present

## 2019-04-02 DIAGNOSIS — J301 Allergic rhinitis due to pollen: Secondary | ICD-10-CM | POA: Diagnosis not present

## 2019-04-02 DIAGNOSIS — J3081 Allergic rhinitis due to animal (cat) (dog) hair and dander: Secondary | ICD-10-CM | POA: Diagnosis not present

## 2019-04-02 DIAGNOSIS — J3089 Other allergic rhinitis: Secondary | ICD-10-CM | POA: Diagnosis not present

## 2019-04-04 DIAGNOSIS — N189 Chronic kidney disease, unspecified: Secondary | ICD-10-CM | POA: Diagnosis not present

## 2019-04-04 DIAGNOSIS — I129 Hypertensive chronic kidney disease with stage 1 through stage 4 chronic kidney disease, or unspecified chronic kidney disease: Secondary | ICD-10-CM | POA: Diagnosis not present

## 2019-04-04 DIAGNOSIS — N183 Chronic kidney disease, stage 3 (moderate): Secondary | ICD-10-CM | POA: Diagnosis not present

## 2019-04-04 DIAGNOSIS — D631 Anemia in chronic kidney disease: Secondary | ICD-10-CM | POA: Diagnosis not present

## 2019-04-04 DIAGNOSIS — N2581 Secondary hyperparathyroidism of renal origin: Secondary | ICD-10-CM | POA: Diagnosis not present

## 2019-04-09 DIAGNOSIS — J3089 Other allergic rhinitis: Secondary | ICD-10-CM | POA: Diagnosis not present

## 2019-04-09 DIAGNOSIS — J301 Allergic rhinitis due to pollen: Secondary | ICD-10-CM | POA: Diagnosis not present

## 2019-04-09 DIAGNOSIS — J3081 Allergic rhinitis due to animal (cat) (dog) hair and dander: Secondary | ICD-10-CM | POA: Diagnosis not present

## 2019-04-11 DIAGNOSIS — Z6841 Body Mass Index (BMI) 40.0 and over, adult: Secondary | ICD-10-CM | POA: Diagnosis not present

## 2019-04-11 DIAGNOSIS — E7800 Pure hypercholesterolemia, unspecified: Secondary | ICD-10-CM | POA: Diagnosis not present

## 2019-04-11 DIAGNOSIS — R635 Abnormal weight gain: Secondary | ICD-10-CM | POA: Diagnosis not present

## 2019-04-11 DIAGNOSIS — E559 Vitamin D deficiency, unspecified: Secondary | ICD-10-CM | POA: Diagnosis not present

## 2019-04-16 DIAGNOSIS — J301 Allergic rhinitis due to pollen: Secondary | ICD-10-CM | POA: Diagnosis not present

## 2019-04-16 DIAGNOSIS — J3089 Other allergic rhinitis: Secondary | ICD-10-CM | POA: Diagnosis not present

## 2019-04-16 DIAGNOSIS — J3081 Allergic rhinitis due to animal (cat) (dog) hair and dander: Secondary | ICD-10-CM | POA: Diagnosis not present

## 2019-04-23 DIAGNOSIS — J3081 Allergic rhinitis due to animal (cat) (dog) hair and dander: Secondary | ICD-10-CM | POA: Diagnosis not present

## 2019-04-23 DIAGNOSIS — J301 Allergic rhinitis due to pollen: Secondary | ICD-10-CM | POA: Diagnosis not present

## 2019-04-23 DIAGNOSIS — J3089 Other allergic rhinitis: Secondary | ICD-10-CM | POA: Diagnosis not present

## 2019-04-30 DIAGNOSIS — J301 Allergic rhinitis due to pollen: Secondary | ICD-10-CM | POA: Diagnosis not present

## 2019-04-30 DIAGNOSIS — J3089 Other allergic rhinitis: Secondary | ICD-10-CM | POA: Diagnosis not present

## 2019-04-30 DIAGNOSIS — J3081 Allergic rhinitis due to animal (cat) (dog) hair and dander: Secondary | ICD-10-CM | POA: Diagnosis not present

## 2019-05-07 DIAGNOSIS — J3089 Other allergic rhinitis: Secondary | ICD-10-CM | POA: Diagnosis not present

## 2019-05-07 DIAGNOSIS — J301 Allergic rhinitis due to pollen: Secondary | ICD-10-CM | POA: Diagnosis not present

## 2019-05-07 DIAGNOSIS — J3081 Allergic rhinitis due to animal (cat) (dog) hair and dander: Secondary | ICD-10-CM | POA: Diagnosis not present

## 2019-05-09 DIAGNOSIS — E7800 Pure hypercholesterolemia, unspecified: Secondary | ICD-10-CM | POA: Diagnosis not present

## 2019-05-09 DIAGNOSIS — Z7189 Other specified counseling: Secondary | ICD-10-CM | POA: Diagnosis not present

## 2019-05-09 DIAGNOSIS — R635 Abnormal weight gain: Secondary | ICD-10-CM | POA: Diagnosis not present

## 2019-05-09 DIAGNOSIS — E559 Vitamin D deficiency, unspecified: Secondary | ICD-10-CM | POA: Diagnosis not present

## 2019-05-14 DIAGNOSIS — J3089 Other allergic rhinitis: Secondary | ICD-10-CM | POA: Diagnosis not present

## 2019-05-14 DIAGNOSIS — J301 Allergic rhinitis due to pollen: Secondary | ICD-10-CM | POA: Diagnosis not present

## 2019-05-14 DIAGNOSIS — J3081 Allergic rhinitis due to animal (cat) (dog) hair and dander: Secondary | ICD-10-CM | POA: Diagnosis not present

## 2019-05-16 DIAGNOSIS — I129 Hypertensive chronic kidney disease with stage 1 through stage 4 chronic kidney disease, or unspecified chronic kidney disease: Secondary | ICD-10-CM | POA: Diagnosis not present

## 2019-05-16 DIAGNOSIS — B20 Human immunodeficiency virus [HIV] disease: Secondary | ICD-10-CM | POA: Diagnosis not present

## 2019-05-16 DIAGNOSIS — N183 Chronic kidney disease, stage 3 unspecified: Secondary | ICD-10-CM | POA: Diagnosis not present

## 2019-05-16 DIAGNOSIS — N2581 Secondary hyperparathyroidism of renal origin: Secondary | ICD-10-CM | POA: Diagnosis not present

## 2019-05-16 DIAGNOSIS — R3121 Asymptomatic microscopic hematuria: Secondary | ICD-10-CM | POA: Diagnosis not present

## 2019-05-21 DIAGNOSIS — J3089 Other allergic rhinitis: Secondary | ICD-10-CM | POA: Diagnosis not present

## 2019-05-21 DIAGNOSIS — J301 Allergic rhinitis due to pollen: Secondary | ICD-10-CM | POA: Diagnosis not present

## 2019-05-21 DIAGNOSIS — J3081 Allergic rhinitis due to animal (cat) (dog) hair and dander: Secondary | ICD-10-CM | POA: Diagnosis not present

## 2019-05-28 DIAGNOSIS — J3089 Other allergic rhinitis: Secondary | ICD-10-CM | POA: Diagnosis not present

## 2019-05-28 DIAGNOSIS — J301 Allergic rhinitis due to pollen: Secondary | ICD-10-CM | POA: Diagnosis not present

## 2019-05-28 DIAGNOSIS — J3081 Allergic rhinitis due to animal (cat) (dog) hair and dander: Secondary | ICD-10-CM | POA: Diagnosis not present

## 2019-05-29 DIAGNOSIS — J3089 Other allergic rhinitis: Secondary | ICD-10-CM | POA: Diagnosis not present

## 2019-05-29 DIAGNOSIS — J3081 Allergic rhinitis due to animal (cat) (dog) hair and dander: Secondary | ICD-10-CM | POA: Diagnosis not present

## 2019-05-29 DIAGNOSIS — J301 Allergic rhinitis due to pollen: Secondary | ICD-10-CM | POA: Diagnosis not present

## 2019-05-31 ENCOUNTER — Other Ambulatory Visit: Payer: Self-pay | Admitting: Infectious Diseases

## 2019-05-31 DIAGNOSIS — I10 Essential (primary) hypertension: Secondary | ICD-10-CM

## 2019-06-04 DIAGNOSIS — J3089 Other allergic rhinitis: Secondary | ICD-10-CM | POA: Diagnosis not present

## 2019-06-04 DIAGNOSIS — J301 Allergic rhinitis due to pollen: Secondary | ICD-10-CM | POA: Diagnosis not present

## 2019-06-04 DIAGNOSIS — J3081 Allergic rhinitis due to animal (cat) (dog) hair and dander: Secondary | ICD-10-CM | POA: Diagnosis not present

## 2019-06-06 DIAGNOSIS — E559 Vitamin D deficiency, unspecified: Secondary | ICD-10-CM | POA: Diagnosis not present

## 2019-06-06 DIAGNOSIS — R635 Abnormal weight gain: Secondary | ICD-10-CM | POA: Diagnosis not present

## 2019-06-06 DIAGNOSIS — E7800 Pure hypercholesterolemia, unspecified: Secondary | ICD-10-CM | POA: Diagnosis not present

## 2019-06-06 DIAGNOSIS — Z6841 Body Mass Index (BMI) 40.0 and over, adult: Secondary | ICD-10-CM | POA: Diagnosis not present

## 2019-06-11 DIAGNOSIS — J3089 Other allergic rhinitis: Secondary | ICD-10-CM | POA: Diagnosis not present

## 2019-06-11 DIAGNOSIS — J3081 Allergic rhinitis due to animal (cat) (dog) hair and dander: Secondary | ICD-10-CM | POA: Diagnosis not present

## 2019-06-11 DIAGNOSIS — J301 Allergic rhinitis due to pollen: Secondary | ICD-10-CM | POA: Diagnosis not present

## 2019-06-13 DIAGNOSIS — J301 Allergic rhinitis due to pollen: Secondary | ICD-10-CM | POA: Diagnosis not present

## 2019-06-13 DIAGNOSIS — J3089 Other allergic rhinitis: Secondary | ICD-10-CM | POA: Diagnosis not present

## 2019-06-13 DIAGNOSIS — J3081 Allergic rhinitis due to animal (cat) (dog) hair and dander: Secondary | ICD-10-CM | POA: Diagnosis not present

## 2019-06-16 ENCOUNTER — Other Ambulatory Visit: Payer: Self-pay | Admitting: Infectious Diseases

## 2019-06-16 DIAGNOSIS — I10 Essential (primary) hypertension: Secondary | ICD-10-CM

## 2019-06-25 DIAGNOSIS — J301 Allergic rhinitis due to pollen: Secondary | ICD-10-CM | POA: Diagnosis not present

## 2019-06-25 DIAGNOSIS — J3089 Other allergic rhinitis: Secondary | ICD-10-CM | POA: Diagnosis not present

## 2019-06-25 DIAGNOSIS — J3081 Allergic rhinitis due to animal (cat) (dog) hair and dander: Secondary | ICD-10-CM | POA: Diagnosis not present

## 2019-07-02 DIAGNOSIS — J3089 Other allergic rhinitis: Secondary | ICD-10-CM | POA: Diagnosis not present

## 2019-07-02 DIAGNOSIS — J3081 Allergic rhinitis due to animal (cat) (dog) hair and dander: Secondary | ICD-10-CM | POA: Diagnosis not present

## 2019-07-02 DIAGNOSIS — J301 Allergic rhinitis due to pollen: Secondary | ICD-10-CM | POA: Diagnosis not present

## 2019-07-04 ENCOUNTER — Other Ambulatory Visit: Payer: Self-pay

## 2019-07-04 DIAGNOSIS — I10 Essential (primary) hypertension: Secondary | ICD-10-CM

## 2019-07-04 DIAGNOSIS — R635 Abnormal weight gain: Secondary | ICD-10-CM | POA: Diagnosis not present

## 2019-07-04 DIAGNOSIS — E669 Obesity, unspecified: Secondary | ICD-10-CM | POA: Diagnosis not present

## 2019-07-04 DIAGNOSIS — E7800 Pure hypercholesterolemia, unspecified: Secondary | ICD-10-CM | POA: Diagnosis not present

## 2019-07-04 DIAGNOSIS — E559 Vitamin D deficiency, unspecified: Secondary | ICD-10-CM | POA: Diagnosis not present

## 2019-07-04 MED ORDER — LOSARTAN POTASSIUM 100 MG PO TABS: 100.0000 mg | ORAL_TABLET | Freq: Every day | ORAL | 0 refills | Status: DC

## 2019-07-04 MED ORDER — AMLODIPINE BESYLATE 10 MG PO TABS: 10.0000 mg | ORAL_TABLET | Freq: Every day | ORAL | 0 refills | Status: DC

## 2019-07-04 MED ORDER — PHENTERMINE HCL 30 MG PO CAPS: 30.0000 mg | ORAL_CAPSULE | ORAL | 2 refills | Status: DC

## 2019-07-16 DIAGNOSIS — J3089 Other allergic rhinitis: Secondary | ICD-10-CM | POA: Diagnosis not present

## 2019-07-16 DIAGNOSIS — J301 Allergic rhinitis due to pollen: Secondary | ICD-10-CM | POA: Diagnosis not present

## 2019-07-16 DIAGNOSIS — J3081 Allergic rhinitis due to animal (cat) (dog) hair and dander: Secondary | ICD-10-CM | POA: Diagnosis not present

## 2019-08-23 ENCOUNTER — Other Ambulatory Visit: Payer: Self-pay | Admitting: Infectious Diseases

## 2019-08-23 DIAGNOSIS — I10 Essential (primary) hypertension: Secondary | ICD-10-CM

## 2019-08-25 ENCOUNTER — Other Ambulatory Visit: Payer: Self-pay

## 2019-08-25 DIAGNOSIS — I10 Essential (primary) hypertension: Secondary | ICD-10-CM

## 2019-08-25 MED ORDER — AMLODIPINE BESYLATE 10 MG PO TABS: 10.0000 mg | ORAL_TABLET | Freq: Every day | ORAL | 0 refills | Status: DC

## 2019-08-25 MED ORDER — LOSARTAN POTASSIUM 100 MG PO TABS: 100.0000 mg | ORAL_TABLET | Freq: Every day | ORAL | 0 refills | Status: DC

## 2019-09-17 ENCOUNTER — Other Ambulatory Visit: Payer: Self-pay

## 2019-09-17 DIAGNOSIS — B20 Human immunodeficiency virus [HIV] disease: Secondary | ICD-10-CM

## 2019-09-17 MED ORDER — BIKTARVY 50-200-25 MG PO TABS: 1.0000 | ORAL_TABLET | Freq: Every day | ORAL | 2 refills | Status: DC

## 2019-10-15 ENCOUNTER — Other Ambulatory Visit: Payer: Self-pay

## 2019-10-15 DIAGNOSIS — I10 Essential (primary) hypertension: Secondary | ICD-10-CM

## 2019-10-16 MED ORDER — LOSARTAN POTASSIUM 100 MG PO TABS: 100.0000 mg | ORAL_TABLET | Freq: Every day | ORAL | 0 refills | Status: DC

## 2019-11-06 ENCOUNTER — Other Ambulatory Visit: Payer: Self-pay

## 2019-11-06 DIAGNOSIS — I10 Essential (primary) hypertension: Secondary | ICD-10-CM

## 2019-11-06 MED ORDER — AMLODIPINE BESYLATE 10 MG PO TABS: 10.0000 mg | ORAL_TABLET | Freq: Every day | ORAL | 0 refills | Status: DC

## 2019-11-06 MED ORDER — LOSARTAN POTASSIUM 100 MG PO TABS: 100.0000 mg | ORAL_TABLET | Freq: Every day | ORAL | 0 refills | Status: DC

## 2019-11-17 ENCOUNTER — Other Ambulatory Visit: Payer: Self-pay | Admitting: Infectious Diseases

## 2019-11-17 DIAGNOSIS — I10 Essential (primary) hypertension: Secondary | ICD-10-CM

## 2019-11-25 ENCOUNTER — Other Ambulatory Visit: Payer: Self-pay

## 2019-11-25 DIAGNOSIS — B20 Human immunodeficiency virus [HIV] disease: Secondary | ICD-10-CM

## 2019-11-25 MED ORDER — BIKTARVY 50-200-25 MG PO TABS: 1.0000 | ORAL_TABLET | Freq: Every day | ORAL | 0 refills | Status: DC

## 2019-12-05 ENCOUNTER — Other Ambulatory Visit: Payer: Self-pay

## 2019-12-05 ENCOUNTER — Ambulatory Visit (INDEPENDENT_AMBULATORY_CARE_PROVIDER_SITE_OTHER): Payer: BC Managed Care – PPO | Admitting: Infectious Diseases

## 2019-12-05 ENCOUNTER — Encounter: Payer: Self-pay | Admitting: Infectious Diseases

## 2019-12-05 ENCOUNTER — Other Ambulatory Visit (HOSPITAL_COMMUNITY)
Admission: RE | Admit: 2019-12-05 | Discharge: 2019-12-05 | Disposition: A | Discharge status: Home / Self Care | Payer: BC Managed Care – PPO | Source: Ambulatory Visit | Attending: Infectious Diseases | Admitting: Infectious Diseases

## 2019-12-05 VITALS — BP 127/73 | HR 79 | Temp 98.0°F

## 2019-12-05 DIAGNOSIS — Z79899 Other long term (current) drug therapy: Secondary | ICD-10-CM

## 2019-12-05 DIAGNOSIS — Z113 Encounter for screening for infections with a predominantly sexual mode of transmission: Secondary | ICD-10-CM | POA: Diagnosis present

## 2019-12-05 DIAGNOSIS — B20 Human immunodeficiency virus [HIV] disease: Secondary | ICD-10-CM

## 2019-12-05 DIAGNOSIS — I10 Essential (primary) hypertension: Secondary | ICD-10-CM

## 2019-12-05 DIAGNOSIS — N182 Chronic kidney disease, stage 2 (mild): Secondary | ICD-10-CM

## 2019-12-05 DIAGNOSIS — A539 Syphilis, unspecified: Secondary | ICD-10-CM

## 2019-12-05 LAB — T-HELPER CELL (CD4) - (RCID CLINIC ONLY)
CD4 % Helper T Cell: 38 % (ref 33–65)
CD4 T Cell Abs: 538 /uL (ref 400–1790)

## 2019-12-05 NOTE — Assessment & Plan Note (Signed)
He is doing well,  Has been off losartan for 2 months and his BP today is well controlled.  Will stop.  Attribute to wt loss.

## 2019-12-05 NOTE — Assessment & Plan Note (Signed)
He is doing very well Check his labs today Given condoms rtc in 9 months, labs entered.  He has gotten COVID vax.

## 2019-12-05 NOTE — Assessment & Plan Note (Signed)
This is significantly improved, greatly appreciate his primary f/u.

## 2019-12-05 NOTE — Assessment & Plan Note (Signed)
Repeat RPR today.

## 2019-12-05 NOTE — Assessment & Plan Note (Signed)
Will recheck his Cr today.  Again, off ARB.

## 2019-12-05 NOTE — Progress Notes (Signed)
   Subjective:    Patient ID: Lee [name], male    DOB: 1981/02/13, 39 y.o.   MRN: [id number]  HPI 39yo M with hx of HTN and HIV+ dx 08-03-16.  He had first visit 2-15 and was also found to have syphilis. He received IM PEN G on 09-14-16.  His genotype is naive. He is HLA negative.  Has been on triumeq but at 5-31 he had increased LFTs and Cr on labs. He had repeat labs done on 6-8 which confirmed this. He was seen in f/u with pharm 02-2017 and was changed to biktarvy. No probs with ART.   He has been seen at [facility] loss clinic, was prev on phentiramine then onto semeglutide.  Has been back to gym. Wt down to 256. Has personal trainer. Goal is 230s All is well. Working, started own business.  "feel great" Has gotten covid vax.   HIV 1 RNA Quant (copies/mL)  Date Value  02/20/2019 <20 DETECTED (A)  02/21/2018 28 (H)  07/26/2017 <20 NOT DETECTED   CD4 T Cell Abs (/uL)  Date Value  02/20/2019 524  02/21/2018 600  07/26/2017 570    Review of Systems  Constitutional: Negative for appetite change.  Respiratory: Negative for cough and shortness of breath.   Gastrointestinal: Negative for constipation and diarrhea.  Genitourinary: Negative for difficulty urinating.  Psychiatric/Behavioral: Negative for sleep disturbance.       Objective:   Physical Exam Constitutional:      General: He is not in acute distress.    Appearance: Normal appearance. He is not ill-appearing.  HENT:     Mouth/Throat:     Mouth: Mucous membranes are moist.     Pharynx: No oropharyngeal exudate.  Eyes:     Extraocular Movements: Extraocular movements intact.     Pupils: Pupils are equal, round, and reactive to light.  Cardiovascular:     Rate and Rhythm: Normal rate and regular rhythm.  Pulmonary:     Effort: Pulmonary effort is normal.     Breath sounds: Normal breath sounds.  Abdominal:     General: Bowel sounds are normal. There is no distension.     Palpations: Abdomen is soft.   Tenderness: There is no abdominal tenderness.  Musculoskeletal:     Cervical back: Normal range of motion and neck supple.     Right lower leg: No edema.     Left lower leg: No edema.  Neurological:     General: No focal deficit present.     Mental Status: He is alert.  Psychiatric:        Mood and Affect: Mood normal.           Assessment & Plan:

## 2019-12-08 LAB — RPR: RPR Ser Ql: REACTIVE — AB

## 2019-12-08 LAB — CBC
HCT: 47.5 % (ref 38.5–50.0)
Hemoglobin: 16.5 g/dL (ref 13.2–17.1)
MCH: 31.4 pg (ref 27.0–33.0)
MCHC: 34.7 g/dL (ref 32.0–36.0)
MCV: 90.3 fL (ref 80.0–100.0)
MPV: 9.6 fL (ref 7.5–12.5)
Platelets: 178 10*3/uL (ref 140–400)
RBC: 5.26 10*6/uL (ref 4.20–5.80)
RDW: 13.5 % (ref 11.0–15.0)
WBC: 5.3 10*3/uL (ref 3.8–10.8)

## 2019-12-08 LAB — COMPREHENSIVE METABOLIC PANEL
AG Ratio: 1.4 (calc) (ref 1.0–2.5)
ALT: 53 U/L — ABNORMAL HIGH (ref 9–46)
AST: 43 U/L — ABNORMAL HIGH (ref 10–40)
Albumin: 4.5 g/dL (ref 3.6–5.1)
Alkaline phosphatase (APISO): 62 U/L (ref 36–130)
BUN/Creatinine Ratio: 11 (calc) (ref 6–22)
BUN: 20 mg/dL (ref 7–25)
CO2: 19 mmol/L — ABNORMAL LOW (ref 20–32)
Calcium: 9 mg/dL (ref 8.6–10.3)
Chloride: 112 mmol/L — ABNORMAL HIGH (ref 98–110)
Creat: 1.83 mg/dL — ABNORMAL HIGH (ref 0.60–1.35)
Globulin: 3.2 g/dL (calc) (ref 1.9–3.7)
Glucose, Bld: 86 mg/dL (ref 65–99)
Potassium: 4.3 mmol/L (ref 3.5–5.3)
Sodium: 137 mmol/L (ref 135–146)
Total Bilirubin: 1.1 mg/dL (ref 0.2–1.2)
Total Protein: 7.7 g/dL (ref 6.1–8.1)

## 2019-12-08 LAB — RPR TITER: RPR Titer: 1:8 {titer} — ABNORMAL HIGH

## 2019-12-08 LAB — HIV-1 RNA QUANT-NO REFLEX-BLD
HIV 1 RNA Quant: 35 {copies}/mL — ABNORMAL HIGH
HIV-1 RNA Quant, Log: 1.54 {Log_copies}/mL — ABNORMAL HIGH

## 2019-12-08 LAB — FLUORESCENT TREPONEMAL AB(FTA)-IGG-BLD: Fluorescent Treponemal ABS: REACTIVE — AB

## 2019-12-17 ENCOUNTER — Other Ambulatory Visit: Payer: Self-pay

## 2019-12-17 DIAGNOSIS — B20 Human immunodeficiency virus [HIV] disease: Secondary | ICD-10-CM

## 2019-12-17 MED ORDER — BIKTARVY 50-200-25 MG PO TABS: 1.0000 | ORAL_TABLET | Freq: Every day | ORAL | 5 refills | Status: DC

## 2020-03-12 ENCOUNTER — Emergency Department (HOSPITAL_BASED_OUTPATIENT_CLINIC_OR_DEPARTMENT_OTHER): Payer: BC Managed Care – PPO

## 2020-03-12 ENCOUNTER — Inpatient Hospital Stay (HOSPITAL_BASED_OUTPATIENT_CLINIC_OR_DEPARTMENT_OTHER)
Admission: EM | Admit: 2020-03-12 | Discharge: 2020-03-16 | DRG: 339 | Disposition: A | Discharge status: Home / Self Care | Payer: BC Managed Care – PPO | Attending: Physician Assistant | Admitting: Physician Assistant

## 2020-03-12 ENCOUNTER — Encounter (HOSPITAL_COMMUNITY): Admission: EM | Disposition: A | Discharge status: Home / Self Care | Payer: Self-pay | Source: Home / Self Care

## 2020-03-12 ENCOUNTER — Emergency Department (HOSPITAL_COMMUNITY): Payer: BC Managed Care – PPO | Admitting: Certified Registered Nurse Anesthetist

## 2020-03-12 ENCOUNTER — Other Ambulatory Visit: Payer: Self-pay

## 2020-03-12 ENCOUNTER — Encounter (HOSPITAL_BASED_OUTPATIENT_CLINIC_OR_DEPARTMENT_OTHER): Payer: Self-pay | Admitting: Emergency Medicine

## 2020-03-12 DIAGNOSIS — Z8249 Family history of ischemic heart disease and other diseases of the circulatory system: Secondary | ICD-10-CM

## 2020-03-12 DIAGNOSIS — N1832 Chronic kidney disease, stage 3b: Secondary | ICD-10-CM | POA: Diagnosis not present

## 2020-03-12 DIAGNOSIS — Z789 Other specified health status: Secondary | ICD-10-CM | POA: Diagnosis present

## 2020-03-12 DIAGNOSIS — Z21 Asymptomatic human immunodeficiency virus [HIV] infection status: Secondary | ICD-10-CM | POA: Diagnosis not present

## 2020-03-12 DIAGNOSIS — I129 Hypertensive chronic kidney disease with stage 1 through stage 4 chronic kidney disease, or unspecified chronic kidney disease: Secondary | ICD-10-CM | POA: Diagnosis present

## 2020-03-12 DIAGNOSIS — Z888 Allergy status to other drugs, medicaments and biological substances status: Secondary | ICD-10-CM | POA: Diagnosis not present

## 2020-03-12 DIAGNOSIS — K567 Ileus, unspecified: Secondary | ICD-10-CM | POA: Diagnosis not present

## 2020-03-12 DIAGNOSIS — Z20822 Contact with and (suspected) exposure to covid-19: Secondary | ICD-10-CM | POA: Diagnosis not present

## 2020-03-12 DIAGNOSIS — Z79899 Other long term (current) drug therapy: Secondary | ICD-10-CM | POA: Diagnosis not present

## 2020-03-12 DIAGNOSIS — E871 Hypo-osmolality and hyponatremia: Secondary | ICD-10-CM | POA: Diagnosis not present

## 2020-03-12 DIAGNOSIS — K3532 Acute appendicitis with perforation and localized peritonitis, without abscess: Secondary | ICD-10-CM | POA: Diagnosis not present

## 2020-03-12 DIAGNOSIS — N183 Chronic kidney disease, stage 3 unspecified: Secondary | ICD-10-CM | POA: Diagnosis present

## 2020-03-12 DIAGNOSIS — K3580 Unspecified acute appendicitis: Secondary | ICD-10-CM

## 2020-03-12 DIAGNOSIS — N1831 Chronic kidney disease, stage 3a: Secondary | ICD-10-CM | POA: Diagnosis present

## 2020-03-12 DIAGNOSIS — B20 Human immunodeficiency virus [HIV] disease: Secondary | ICD-10-CM | POA: Diagnosis present

## 2020-03-12 HISTORY — PX: LAPAROSCOPIC APPENDECTOMY: SHX408

## 2020-03-12 LAB — URINALYSIS, MICROSCOPIC (REFLEX)

## 2020-03-12 LAB — COMPREHENSIVE METABOLIC PANEL
ALT: 12 U/L (ref 0–44)
AST: 13 U/L — ABNORMAL LOW (ref 15–41)
Albumin: 4.1 g/dL (ref 3.5–5.0)
Alkaline Phosphatase: 55 U/L (ref 38–126)
Anion gap: 9 (ref 5–15)
BUN: 17 mg/dL (ref 6–20)
CO2: 17 mmol/L — ABNORMAL LOW (ref 22–32)
Calcium: 8.9 mg/dL (ref 8.9–10.3)
Chloride: 106 mmol/L (ref 98–111)
Creatinine, Ser: 1.47 mg/dL — ABNORMAL HIGH (ref 0.61–1.24)
GFR calc Af Amer: >60 mL/min (ref >60)
GFR calc non Af Amer: >60 mL/min (ref >60)
Glucose, Bld: 106 mg/dL — ABNORMAL HIGH (ref 70–99)
Potassium: 3.8 mmol/L (ref 3.5–5.1)
Sodium: 132 mmol/L — ABNORMAL LOW (ref 135–145)
Total Bilirubin: 2.6 mg/dL — ABNORMAL HIGH (ref 0.3–1.2)
Total Protein: 8.5 g/dL — ABNORMAL HIGH (ref 6.5–8.1)

## 2020-03-12 LAB — URINALYSIS, ROUTINE W REFLEX MICROSCOPIC
Bilirubin Urine: NEGATIVE
Glucose, UA: NEGATIVE mg/dL
Ketones, ur: NEGATIVE mg/dL
Leukocytes,Ua: NEGATIVE
Nitrite: NEGATIVE
Protein, ur: 30 mg/dL — AB
Specific Gravity, Urine: 1.015 (ref 1.005–1.030)
pH: 7 (ref 5.0–8.0)

## 2020-03-12 LAB — CBC
HCT: 45.5 % (ref 39.0–52.0)
Hemoglobin: 16.1 g/dL (ref 13.0–17.0)
MCH: 31.8 pg (ref 26.0–34.0)
MCHC: 35.4 g/dL (ref 30.0–36.0)
MCV: 89.7 fL (ref 80.0–100.0)
Platelets: 171 10*3/uL (ref 150–400)
RBC: 5.07 MIL/uL (ref 4.22–5.81)
RDW: 12.6 % (ref 11.5–15.5)
WBC: 15.4 10*3/uL — ABNORMAL HIGH (ref 4.0–10.5)
nRBC: 0 % (ref 0.0–0.2)

## 2020-03-12 LAB — LIPASE, BLOOD: Lipase: 41 U/L (ref 11–51)

## 2020-03-12 LAB — SARS CORONAVIRUS 2 BY RT PCR (HOSPITAL ORDER, PERFORMED IN ~~LOC~~ HOSPITAL LAB): SARS Coronavirus 2: NEGATIVE

## 2020-03-12 SURGERY — APPENDECTOMY, LAPAROSCOPIC
Anesthesia: General

## 2020-03-12 MED ORDER — PHENYLEPHRINE HCL (PRESSORS) 10 MG/ML IV SOLN
INTRAVENOUS | Status: AC
  Filled 2020-03-12: qty 1

## 2020-03-12 MED ORDER — ACETAMINOPHEN 10 MG/ML IV SOLN
INTRAVENOUS | Status: DC | PRN
  Administered 2020-03-12: 1000 mg via INTRAVENOUS

## 2020-03-12 MED ORDER — LACTATED RINGERS IR SOLN
Status: DC | PRN
  Administered 2020-03-12: 1000 mL

## 2020-03-12 MED ORDER — FENTANYL CITRATE (PF) 250 MCG/5ML IJ SOLN
INTRAMUSCULAR | Status: DC | PRN
Start: 2020-03-12 — End: 2020-03-12
  Administered 2020-03-12 (×4): 50 ug via INTRAVENOUS

## 2020-03-12 MED ORDER — SIMETHICONE 80 MG PO CHEW: 40.0000 mg | CHEWABLE_TABLET | Freq: Four times a day (QID) | ORAL | Status: DC | PRN

## 2020-03-12 MED ORDER — BUPIVACAINE-EPINEPHRINE (PF) 0.5% -1:200000 IJ SOLN
INTRAMUSCULAR | Status: AC
  Filled 2020-03-12: qty 30

## 2020-03-12 MED ORDER — PIPERACILLIN-TAZOBACTAM 3.375 G IVPB 30 MIN
3.3750 g | Freq: Once | INTRAVENOUS | Status: DC
  Filled 2020-03-12: qty 50

## 2020-03-12 MED ORDER — LACTATED RINGERS IV BOLUS
1000.0000 mL | Freq: Once | INTRAVENOUS | Status: AC
  Administered 2020-03-12: 1000 mL via INTRAVENOUS

## 2020-03-12 MED ORDER — LIDOCAINE 2% (20 MG/ML) 5 ML SYRINGE
INTRAMUSCULAR | Status: AC
  Filled 2020-03-12: qty 5

## 2020-03-12 MED ORDER — METRONIDAZOLE IN NACL 5-0.79 MG/ML-% IV SOLN
500.0000 mg | Freq: Once | INTRAVENOUS | Status: AC
  Administered 2020-03-12: 500 mg via INTRAVENOUS
  Filled 2020-03-12: qty 100

## 2020-03-12 MED ORDER — ONDANSETRON 4 MG PO TBDP
4.0000 mg | ORAL_TABLET | Freq: Four times a day (QID) | ORAL | Status: DC | PRN
  Administered 2020-03-16: 4 mg via ORAL
  Filled 2020-03-12: qty 1

## 2020-03-12 MED ORDER — HYDROCODONE-ACETAMINOPHEN 5-325 MG PO TABS
1.0000 | ORAL_TABLET | ORAL | Status: DC | PRN
  Administered 2020-03-13: 2 via ORAL
  Administered 2020-03-13: 1 via ORAL
  Administered 2020-03-14 (×2): 2 via ORAL
  Administered 2020-03-14 – 2020-03-15 (×2): 1 via ORAL
  Filled 2020-03-12: qty 2
  Filled 2020-03-12: qty 1
  Filled 2020-03-12 (×2): qty 2
  Filled 2020-03-12 (×2): qty 1
  Filled 2020-03-12: qty 2
  Filled 2020-03-12: qty 1

## 2020-03-12 MED ORDER — PROPOFOL 10 MG/ML IV BOLUS
INTRAVENOUS | Status: AC
  Filled 2020-03-12: qty 20

## 2020-03-12 MED ORDER — ONDANSETRON HCL 4 MG/2ML IJ SOLN
INTRAMUSCULAR | Status: AC
  Filled 2020-03-12: qty 2

## 2020-03-12 MED ORDER — SENNOSIDES-DOCUSATE SODIUM 8.6-50 MG PO TABS
1.0000 | ORAL_TABLET | Freq: Once | ORAL | Status: AC
  Administered 2020-03-13: 1 via ORAL
  Filled 2020-03-12: qty 1

## 2020-03-12 MED ORDER — DEXAMETHASONE SODIUM PHOSPHATE 10 MG/ML IJ SOLN
INTRAMUSCULAR | Status: DC | PRN
  Administered 2020-03-12: 8 mg via INTRAVENOUS

## 2020-03-12 MED ORDER — SODIUM CHLORIDE 0.9 % IV SOLN
1.0000 g | Freq: Once | INTRAVENOUS | Status: AC
  Administered 2020-03-12: 1 g via INTRAVENOUS
  Filled 2020-03-12: qty 10

## 2020-03-12 MED ORDER — ROCURONIUM BROMIDE 10 MG/ML (PF) SYRINGE
PREFILLED_SYRINGE | INTRAVENOUS | Status: AC
  Filled 2020-03-12: qty 10

## 2020-03-12 MED ORDER — AMLODIPINE BESYLATE 10 MG PO TABS
10.0000 mg | ORAL_TABLET | Freq: Every day | ORAL | Status: DC
  Administered 2020-03-13 – 2020-03-16 (×4): 10 mg via ORAL
  Filled 2020-03-12 (×4): qty 1

## 2020-03-12 MED ORDER — MIDAZOLAM HCL 2 MG/2ML IJ SOLN
INTRAMUSCULAR | Status: AC
  Filled 2020-03-12: qty 2

## 2020-03-12 MED ORDER — ALBUTEROL SULFATE (2.5 MG/3ML) 0.083% IN NEBU: 2.5000 mg | INHALATION_SOLUTION | Freq: Four times a day (QID) | RESPIRATORY_TRACT | Status: DC | PRN

## 2020-03-12 MED ORDER — DIPHENHYDRAMINE HCL 50 MG/ML IJ SOLN: 25.0000 mg | Freq: Four times a day (QID) | INTRAMUSCULAR | Status: DC | PRN

## 2020-03-12 MED ORDER — ROCURONIUM BROMIDE 10 MG/ML (PF) SYRINGE
PREFILLED_SYRINGE | INTRAVENOUS | Status: DC | PRN
  Administered 2020-03-12: 50 mg via INTRAVENOUS

## 2020-03-12 MED ORDER — LIDOCAINE 2% (20 MG/ML) 5 ML SYRINGE
INTRAMUSCULAR | Status: DC | PRN
  Administered 2020-03-12: 100 mg via INTRAVENOUS

## 2020-03-12 MED ORDER — SUGAMMADEX SODIUM 500 MG/5ML IV SOLN
INTRAVENOUS | Status: AC
  Filled 2020-03-12: qty 5

## 2020-03-12 MED ORDER — ALBUMIN HUMAN 5 % IV SOLN
INTRAVENOUS | Status: AC
  Filled 2020-03-12: qty 250

## 2020-03-12 MED ORDER — TOPIRAMATE 100 MG PO TABS
100.0000 mg | ORAL_TABLET | Freq: Every day | ORAL | Status: DC
  Administered 2020-03-13 – 2020-03-15 (×4): 100 mg via ORAL
  Filled 2020-03-12 (×4): qty 1

## 2020-03-12 MED ORDER — LACTATED RINGERS IV SOLN: INTRAVENOUS | Status: DC | PRN

## 2020-03-12 MED ORDER — DIPHENHYDRAMINE HCL 25 MG PO CAPS
25.0000 mg | ORAL_CAPSULE | Freq: Four times a day (QID) | ORAL | Status: DC | PRN
  Administered 2020-03-13: 25 mg via ORAL
  Filled 2020-03-12: qty 1

## 2020-03-12 MED ORDER — ONDANSETRON HCL 4 MG/2ML IJ SOLN: 4.0000 mg | Freq: Once | INTRAMUSCULAR | Status: DC | PRN

## 2020-03-12 MED ORDER — ALBUMIN HUMAN 5 % IV SOLN: INTRAVENOUS | Status: DC | PRN

## 2020-03-12 MED ORDER — PHENTERMINE HCL 37.5 MG PO TABS: 37.5000 mg | ORAL_TABLET | Freq: Every day | ORAL | Status: DC

## 2020-03-12 MED ORDER — SODIUM CHLORIDE 0.9 % IV SOLN: INTRAVENOUS | Status: DC

## 2020-03-12 MED ORDER — BICTEGRAVIR-EMTRICITAB-TENOFOV 50-200-25 MG PO TABS
1.0000 | ORAL_TABLET | Freq: Every day | ORAL | Status: DC
  Administered 2020-03-13 – 2020-03-16 (×4): 1 via ORAL
  Filled 2020-03-12 (×5): qty 1

## 2020-03-12 MED ORDER — BUPIVACAINE-EPINEPHRINE 0.5% -1:200000 IJ SOLN
INTRAMUSCULAR | Status: DC | PRN
  Administered 2020-03-12: 17 mL

## 2020-03-12 MED ORDER — GABAPENTIN 300 MG PO CAPS
300.0000 mg | ORAL_CAPSULE | Freq: Two times a day (BID) | ORAL | Status: DC
  Administered 2020-03-12 – 2020-03-15 (×7): 300 mg via ORAL
  Filled 2020-03-12 (×7): qty 1

## 2020-03-12 MED ORDER — ONDANSETRON HCL 4 MG/2ML IJ SOLN
4.0000 mg | Freq: Four times a day (QID) | INTRAMUSCULAR | Status: DC | PRN
  Administered 2020-03-13 – 2020-03-14 (×3): 4 mg via INTRAVENOUS
  Filled 2020-03-12 (×3): qty 2

## 2020-03-12 MED ORDER — ACETAMINOPHEN 500 MG PO TABS
1000.0000 mg | ORAL_TABLET | Freq: Four times a day (QID) | ORAL | Status: DC
  Administered 2020-03-13 – 2020-03-16 (×8): 1000 mg via ORAL
  Filled 2020-03-12 (×12): qty 2

## 2020-03-12 MED ORDER — ALBUTEROL SULFATE HFA 108 (90 BASE) MCG/ACT IN AERS: 2.0000 | INHALATION_SPRAY | Freq: Four times a day (QID) | RESPIRATORY_TRACT | Status: DC | PRN

## 2020-03-12 MED ORDER — METRONIDAZOLE IN NACL 5-0.79 MG/ML-% IV SOLN
500.0000 mg | Freq: Three times a day (TID) | INTRAVENOUS | Status: AC
  Administered 2020-03-12: 500 mg via INTRAVENOUS
  Filled 2020-03-12: qty 100

## 2020-03-12 MED ORDER — DEXAMETHASONE SODIUM PHOSPHATE 10 MG/ML IJ SOLN
INTRAMUSCULAR | Status: AC
  Filled 2020-03-12: qty 1

## 2020-03-12 MED ORDER — MORPHINE SULFATE (PF) 2 MG/ML IV SOLN
2.0000 mg | INTRAVENOUS | Status: DC | PRN
  Administered 2020-03-13: 2 mg via INTRAVENOUS
  Filled 2020-03-12 (×2): qty 1

## 2020-03-12 MED ORDER — EPINEPHRINE 0.3 MG/0.3ML IJ SOAJ: 0.3000 mg | Freq: Once | INTRAMUSCULAR | Status: DC

## 2020-03-12 MED ORDER — FENTANYL CITRATE (PF) 100 MCG/2ML IJ SOLN
INTRAMUSCULAR | Status: AC
Start: 2020-03-12 — End: 2020-03-13
  Filled 2020-03-12: qty 2

## 2020-03-12 MED ORDER — IOHEXOL 300 MG/ML  SOLN
100.0000 mL | Freq: Once | INTRAMUSCULAR | Status: AC | PRN
  Administered 2020-03-12: 100 mL via INTRAVENOUS

## 2020-03-12 MED ORDER — ENOXAPARIN SODIUM 40 MG/0.4ML ~~LOC~~ SOLN
40.0000 mg | SUBCUTANEOUS | Status: DC
  Administered 2020-03-13 – 2020-03-16 (×4): 40 mg via SUBCUTANEOUS
  Filled 2020-03-12 (×4): qty 0.4

## 2020-03-12 MED ORDER — ACETAMINOPHEN 10 MG/ML IV SOLN
INTRAVENOUS | Status: AC
  Filled 2020-03-12: qty 100

## 2020-03-12 MED ORDER — HYDROMORPHONE HCL 1 MG/ML IJ SOLN
1.0000 mg | Freq: Once | INTRAMUSCULAR | Status: AC
  Administered 2020-03-12: 1 mg via INTRAVENOUS
  Filled 2020-03-12: qty 1

## 2020-03-12 MED ORDER — SUCCINYLCHOLINE CHLORIDE 200 MG/10ML IV SOSY
PREFILLED_SYRINGE | INTRAVENOUS | Status: DC | PRN
  Administered 2020-03-12: 140 mg via INTRAVENOUS

## 2020-03-12 MED ORDER — FENTANYL CITRATE (PF) 250 MCG/5ML IJ SOLN
INTRAMUSCULAR | Status: AC
  Filled 2020-03-12: qty 5

## 2020-03-12 MED ORDER — MIDAZOLAM HCL 5 MG/5ML IJ SOLN
INTRAMUSCULAR | Status: DC | PRN
  Administered 2020-03-12 (×2): 1 mg via INTRAVENOUS

## 2020-03-12 MED ORDER — MORPHINE SULFATE (PF) 4 MG/ML IV SOLN
4.0000 mg | INTRAVENOUS | Status: DC | PRN
  Administered 2020-03-12: 4 mg via INTRAVENOUS
  Filled 2020-03-12 (×2): qty 1

## 2020-03-12 MED ORDER — FENTANYL CITRATE (PF) 100 MCG/2ML IJ SOLN
25.0000 ug | INTRAMUSCULAR | Status: DC | PRN
  Administered 2020-03-12 (×2): 50 ug via INTRAVENOUS

## 2020-03-12 MED ORDER — PROPOFOL 10 MG/ML IV BOLUS
INTRAVENOUS | Status: DC | PRN
  Administered 2020-03-12: 200 mg via INTRAVENOUS

## 2020-03-12 MED ORDER — ONDANSETRON HCL 4 MG/2ML IJ SOLN
4.0000 mg | Freq: Once | INTRAMUSCULAR | Status: AC
  Administered 2020-03-12: 4 mg via INTRAVENOUS
  Filled 2020-03-12: qty 2

## 2020-03-12 MED ORDER — SUCCINYLCHOLINE CHLORIDE 200 MG/10ML IV SOSY
PREFILLED_SYRINGE | INTRAVENOUS | Status: AC
  Filled 2020-03-12: qty 10

## 2020-03-12 MED ORDER — ONDANSETRON HCL 4 MG/2ML IJ SOLN
INTRAMUSCULAR | Status: DC | PRN
  Administered 2020-03-12: 4 mg via INTRAVENOUS

## 2020-03-12 MED ORDER — SUGAMMADEX SODIUM 500 MG/5ML IV SOLN
INTRAVENOUS | Status: DC | PRN
  Administered 2020-03-12: 219.6 mg via INTRAVENOUS

## 2020-03-12 SURGICAL SUPPLY — 46 items
ADH SKN CLS APL DERMABOND .7 (GAUZE/BANDAGES/DRESSINGS) ×1
APL PRP STRL LF DISP 70% ISPRP (MISCELLANEOUS) ×1
APPLIER CLIP 5 13 M/L LIGAMAX5 (MISCELLANEOUS)
APPLIER CLIP ROT 10 11.4 M/L (STAPLE)
APR CLP MED LRG 11.4X10 (STAPLE)
APR CLP MED LRG 5 ANG JAW (MISCELLANEOUS)
BAG SPEC RTRVL LRG 6X4 10 (ENDOMECHANICALS) ×1
CABLE HIGH FREQUENCY MONO STRZ (ELECTRODE) ×2 IMPLANT
CHLORAPREP W/TINT 26 (MISCELLANEOUS) ×2 IMPLANT
CLIP APPLIE 5 13 M/L LIGAMAX5 (MISCELLANEOUS) IMPLANT
CLIP APPLIE ROT 10 11.4 M/L (STAPLE) IMPLANT
COVER WAND RF STERILE (DRAPES) IMPLANT
DECANTER SPIKE VIAL GLASS SM (MISCELLANEOUS) ×2 IMPLANT
DERMABOND ADVANCED (GAUZE/BANDAGES/DRESSINGS) ×1
DERMABOND ADVANCED .7 DNX12 (GAUZE/BANDAGES/DRESSINGS) ×1 IMPLANT
DRAPE LAPAROSCOPIC ABDOMINAL (DRAPES) IMPLANT
ELECT REM PT RETURN 15FT ADLT (MISCELLANEOUS) ×2 IMPLANT
GLOVE BIO SURGEON STRL SZ 6.5 (GLOVE) ×6 IMPLANT
GLOVE BIOGEL PI IND STRL 7.0 (GLOVE) ×1 IMPLANT
GLOVE BIOGEL PI INDICATOR 7.0 (GLOVE) ×1
GOWN STRL REUS W/TWL XL LVL3 (GOWN DISPOSABLE) ×4 IMPLANT
GRASPER SUT TROCAR 14GX15 (MISCELLANEOUS) IMPLANT
HANDLE STAPLE EGIA 4 XL (STAPLE) IMPLANT
IRRIG SUCT STRYKERFLOW 2 WTIP (MISCELLANEOUS) ×2
IRRIGATION SUCT STRKRFLW 2 WTP (MISCELLANEOUS) ×1 IMPLANT
KIT BASIN OR (CUSTOM PROCEDURE TRAY) ×2 IMPLANT
KIT TURNOVER KIT A (KITS) ×1 IMPLANT
MARKER SKIN DUAL TIP RULER LAB (MISCELLANEOUS) IMPLANT
PENCIL SMOKE EVACUATOR (MISCELLANEOUS) ×1 IMPLANT
POUCH SPECIMEN RETRIEVAL 10MM (ENDOMECHANICALS) ×1 IMPLANT
RELOAD EGIA 45 MED/THCK PURPLE (STAPLE) ×1 IMPLANT
RELOAD EGIA 45 TAN VASC (STAPLE) IMPLANT
RELOAD EGIA 60 MED/THCK PURPLE (STAPLE) ×2 IMPLANT
RELOAD EGIA 60 TAN VASC (STAPLE) ×1 IMPLANT
RELOAD STAPLE 60 MED/THCK ART (STAPLE) IMPLANT
SCISSORS LAP 5X35 DISP (ENDOMECHANICALS) IMPLANT
SLEEVE XCEL OPT CAN 5 100 (ENDOMECHANICALS) ×1 IMPLANT
SUT VIC AB 2-0 SH 27 (SUTURE)
SUT VIC AB 2-0 SH 27X BRD (SUTURE) IMPLANT
SUT VIC AB 4-0 PS2 27 (SUTURE) ×2 IMPLANT
SUT VICRYL 0 UR6 27IN ABS (SUTURE) IMPLANT
TOWEL OR 17X26 10 PK STRL BLUE (TOWEL DISPOSABLE) ×2 IMPLANT
TRAY FOLEY MTR SLVR 16FR STAT (SET/KITS/TRAYS/PACK) ×1 IMPLANT
TRAY LAPAROSCOPIC (CUSTOM PROCEDURE TRAY) ×2 IMPLANT
TROCAR BLADELESS OPT 5 100 (ENDOMECHANICALS) ×1 IMPLANT
TROCAR XCEL BLUNT TIP 100MML (ENDOMECHANICALS) ×2 IMPLANT

## 2020-03-12 NOTE — ED Triage Notes (Signed)
Pt reports 2 days of abdominal pain and fever. He reports hx of small bowel obstructions. Unable to tolerate fluids or foods and has a few episodes of vomiting.

## 2020-03-12 NOTE — H&P (Signed)
CC: abd pain  HPI: Lee [name] is an 39 y.o. male who is here for abd pain.  He presented to the ED with 2 days generalized abd pain and fevers to 104.  Denies and nausea or bowel changes.  No PSH  Past Medical History:  Diagnosis Date  . CKD (chronic kidney disease) stage 3, GFR 30-59 ml/min 02/20/2019  . Eczema   . Environmental allergies   . Essential hypertension   . HIV test positive ([place])   . 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 Shingles     Past Surgical History:  Procedure Laterality Date  . TYMPANOSTOMY TUBE PLACEMENT    . WISDOM TOOTH EXTRACTION    . WRIST SURGERY      Family History  Problem Relation Age of Onset  . Hypercalcemia Mother   . Hypertension Mother   . Hypertension Father     Social:  reports that he has never smoked. He has never used smokeless tobacco. He reports that he does not drink alcohol and does not use drugs.  Allergies:  Allergies  Allergen Reactions  . Lisinopril Other (See Comments)    Dry cough    . Nickel Rash    Medications: I have reviewed the patient's current medications.  Results for orders placed or performed during the hospital encounter of 03/12/20 (from the past 48 hour(s))  Lipase, blood     Status: None   Collection Time: 03/12/20 12:33 PM  Result Value Ref Range   Lipase 41 11 - 51 U/L    Comment: Performed at [facility], [address] [place], [place] [address]  Comprehensive metabolic panel     Status: Abnormal   Collection Time: 03/12/20 12:33 PM  Result Value Ref Range   Sodium 132 (L) 135 - 145 mmol/L   Potassium 3.8 3.5 - 5.1 mmol/L   Chloride 106 98 - 111 mmol/L   CO2 17 (L) 22 - 32 mmol/L   Glucose, Bld 106 (H) 70 - 99 mg/dL    Comment: Glucose reference range applies only to samples taken after fasting for at least 8 hours.   BUN 17 6 - 20 mg/dL   Creatinine, Ser 1.47 (H) 0.61 - 1.24 mg/dL   Calcium 8.9 8.9 - 10.3 mg/dL   Total Protein 8.5 (H) 6.5 -  8.1 g/dL   Albumin 4.1 3.5 - 5.0 g/dL   AST 13 (L) 15 - 41 U/L   ALT 12 0 - 44 U/L   Alkaline Phosphatase 55 38 - 126 U/L   Total Bilirubin 2.6 (H) 0.3 - 1.2 mg/dL   GFR calc non Af Amer >60 >60 mL/min   GFR calc Af Amer >60 >60 mL/min   Anion gap 9 5 - 15    Comment: Performed at [facility], [address] [place], [place] [address]  CBC     Status: Abnormal   Collection Time: 03/12/20 12:33 PM  Result Value Ref Range   WBC 15.4 (H) 4.0 - 10.5 K/uL   RBC 5.07 4.22 - 5.81 MIL/uL   Hemoglobin 16.1 13.0 - 17.0 g/dL   HCT 45.5 39 - 52 %   MCV 89.7 80.0 - 100.0 fL   MCH 31.8 26.0 - 34.0 pg   MCHC 35.4 30.0 - 36.0 g/dL   RDW 12.6 11.5 - 15.5 %   Platelets 171 150 - 400 K/uL   nRBC 0.0 0.0 - 0.2 %    Comment:  Performed at [facility], [address] [place], [place] [address]  Urinalysis, Routine w reflex microscopic Urine, Clean Catch     Status: Abnormal   Collection Time: 03/12/20 12:34 PM  Result Value Ref Range   Color, Urine YELLOW YELLOW   APPearance CLEAR CLEAR   Specific Gravity, Urine 1.015 1.005 - 1.030   pH 7.0 5.0 - 8.0   Glucose, UA NEGATIVE NEGATIVE mg/dL   Hgb urine dipstick MODERATE (A) NEGATIVE   Bilirubin Urine NEGATIVE NEGATIVE   Ketones, ur NEGATIVE NEGATIVE mg/dL   Protein, ur 30 (A) NEGATIVE mg/dL   Nitrite NEGATIVE NEGATIVE   Leukocytes,Ua NEGATIVE NEGATIVE    Comment: Performed at [facility], [address] [place], [place] [address]  Urinalysis, Microscopic (reflex)     Status: Abnormal   Collection Time: 03/12/20 12:34 PM  Result Value Ref Range   RBC / HPF 11-20 0 - 5 RBC/hpf   WBC, UA 0-5 0 - 5 WBC/hpf   Bacteria, UA FEW (A) NONE SEEN   Squamous Epithelial / LPF 0-5 0 - 5    Comment: Performed at [facility], [address] [place], [place] [address]  SARS Coronavirus 2 by RT PCR (hospital order, performed in [place] hospital lab) Nasopharyngeal Nasopharyngeal Swab     Status: None    Collection Time: 03/12/20  3:46 PM   Specimen: Nasopharyngeal Swab  Result Value Ref Range   SARS Coronavirus 2 NEGATIVE NEGATIVE    Comment: (NOTE) SARS-CoV-2 target nucleic acids are NOT DETECTED.  The SARS-CoV-2 RNA is generally detectable in upper and lower respiratory specimens during the acute phase of infection. The lowest concentration of SARS-CoV-2 viral copies this assay can detect is 250 copies / mL. A negative result does not preclude SARS-CoV-2 infection and should not be used as the sole basis for treatment or other patient management decisions.  A negative result may occur with improper specimen collection / handling, submission of specimen other than nasopharyngeal swab, presence of viral mutation(s) within the areas targeted by this assay, and inadequate number of viral copies (<250 copies / mL). A negative result must be combined with clinical observations, patient history, and epidemiological information.  Fact Sheet for Patients:   StrictlyIdeas.no  Fact Sheet for Healthcare Providers: BankingDealers.co.za  This test is not yet approved or  cleared by the [place] FDA and has been authorized for detection and/or diagnosis of SARS-CoV-2 by FDA under an Emergency Use Authorization (EUA).  This EUA will remain in effect (meaning this test can be used) for the duration of the COVID-19 declaration under Section 564(b)(1) of the Act, 21 U.S.C. section 360bbb-3(b)(1), unless the authorization is terminated or revoked sooner.  Performed at [facility], [address] [place], [place] [address]     CT ABDOMEN PELVIS W CONTRAST  Result Date: 03/12/2020 CLINICAL DATA:  Abdominal pain and fever EXAM: CT ABDOMEN AND PELVIS WITH CONTRAST TECHNIQUE: Multidetector CT imaging of the abdomen and pelvis was performed using the standard protocol following bolus administration of intravenous contrast. CONTRAST:   14mL OMNIPAQUE IOHEXOL 300 MG/ML  SOLN COMPARISON:  Dec 13, 2009 FINDINGS: Lower chest: Lung bases are clear. Hepatobiliary: No focal liver lesions are appreciable. The gallbladder wall is not appreciably thickened. There is no biliary duct dilatation. Pancreas: There is no pancreatic mass or inflammatory focus. Spleen: No splenic lesions are evident. Adrenals/Urinary Tract: Adrenals bilaterally appear normal. There is a 9  mm cyst in the upper pole the right kidney laterally. There is a 6 mm cyst arising from the posterior lower pole left kidney. No hydronephrosis evident on either side. There is no renal or ureteral calculus on either side. Urinary bladder wall thickness normal. Stomach/Bowel: There are multiple loops of mildly dilated fluid-filled small and large bowel, likely due to a degree of ileus. No focal transition zone to indicate bowel obstruction. No appreciable free air or portal venous air. The terminal ileum appears unremarkable. Vascular/Lymphatic: No abdominal aortic aneurysm. No evident arterial vascular lesion. Major venous structures appear patent. There is no evident adenopathy in the abdomen or pelvis. Reproductive: Prostate and seminal vesicles are normal in size and contour. No pelvic mass. Other: The appendix appears prominent measuring 11 mm in diameter. There is no surrounding fluid or significant soft tissue stranding in this area. No abscess or perforation in this area of apparent appendiceal inflammation. There is equivocal thickening in the fat immediately adjacent to the appendix. No evident abscess or ascites in the abdomen or pelvis. Musculoskeletal: There are foci of degenerative change in the lumbar spine. No blastic or lytic bone lesions. No intramuscular lesions. IMPRESSION: 1.  Findings concerning for early appendicitis. Appendix: Location: Appendix arises from the cecum inferiorly and medially with the appendix located in the lower right pelvis near the superior acetabular  level. Diameter: 11 mm Appendicolith: None Mucosal hyper-enhancement: Equivocal Extraluminal gas: None Periappendiceal collection: None. Minimal periappendiceal fat thickening. 2. No evident bowel obstruction. Suspect a degree of ileus with multiple loops of fluid-filled small and large bowel. 3. No renal or ureteral calculus. No hydronephrosis. Urinary bladder wall thickness normal. Critical Value/emergent results were called by telephone at the time of interpretation on 03/12/2020 at 3:09 pm to provider [facility] , who verbally acknowledged these results. Electronically Signed   By: Lowella Grip III M.D.   On: 03/12/2020 15:10    ROS - all of the below systems have been reviewed with the patient and positives are indicated with bold text General: chills, fever or night sweats Eyes: blurry vision or double vision ENT: epistaxis or sore throat Allergy/Immunology: itchy/watery eyes or nasal congestion Hematologic/Lymphatic: bleeding problems, blood clots or swollen lymph nodes Endocrine: temperature intolerance or unexpected weight changes Breast: new or changing breast lumps or nipple discharge Resp: cough, shortness of breath, or wheezing CV: chest pain or dyspnea on exertion GI: as per HPI GU: dysuria, trouble voiding, or hematuria MSK: joint pain or joint stiffness Neuro: TIA or stroke symptoms Derm: pruritus and skin lesion changes Psych: anxiety and depression  PE Blood pressure 131/80, pulse (!) 109, temperature (!) 101.6 F (38.7 C), temperature source Oral, resp. rate 20, height 6\' 4"  (1.93 m), weight 109.8 kg, SpO2 99 %. Constitutional: NAD; conversant; no deformities Eyes: Moist conjunctiva; no lid lag; anicteric; PERRL Neck: Trachea midline; no thyromegaly Lungs: Normal respiratory effort; no tactile fremitus CV: RRR; no palpable thrills; no pitting edema GI: Abd TTP RLQ; no palpable hepatosplenomegaly MSK: Normal range of motion of extremities; no  clubbing/cyanosis Psychiatric: Appropriate affect; alert and oriented x3 Lymphatic: No palpable cervical or axillary lymphadenopathy  Results for orders placed or performed during the hospital encounter of 03/12/20 (from the past 48 hour(s))  Lipase, blood     Status: None   Collection Time: 03/12/20 12:33 PM  Result Value Ref Range   Lipase 41 11 - 51 U/L    Comment: Performed at [facility], [address] [place],  [place] [address]  Comprehensive metabolic panel     Status: Abnormal   Collection Time: 03/12/20 12:33 PM  Result Value Ref Range   Sodium 132 (L) 135 - 145 mmol/L   Potassium 3.8 3.5 - 5.1 mmol/L   Chloride 106 98 - 111 mmol/L   CO2 17 (L) 22 - 32 mmol/L   Glucose, Bld 106 (H) 70 - 99 mg/dL    Comment: Glucose reference range applies only to samples taken after fasting for at least 8 hours.   BUN 17 6 - 20 mg/dL   Creatinine, Ser 1.47 (H) 0.61 - 1.24 mg/dL   Calcium 8.9 8.9 - 10.3 mg/dL   Total Protein 8.5 (H) 6.5 - 8.1 g/dL   Albumin 4.1 3.5 - 5.0 g/dL   AST 13 (L) 15 - 41 U/L   ALT 12 0 - 44 U/L   Alkaline Phosphatase 55 38 - 126 U/L   Total Bilirubin 2.6 (H) 0.3 - 1.2 mg/dL   GFR calc non Af Amer >60 >60 mL/min   GFR calc Af Amer >60 >60 mL/min   Anion gap 9 5 - 15    Comment: Performed at [facility], [address] [place], [place] [address]  CBC     Status: Abnormal   Collection Time: 03/12/20 12:33 PM  Result Value Ref Range   WBC 15.4 (H) 4.0 - 10.5 K/uL   RBC 5.07 4.22 - 5.81 MIL/uL   Hemoglobin 16.1 13.0 - 17.0 g/dL   HCT 45.5 39 - 52 %   MCV 89.7 80.0 - 100.0 fL   MCH 31.8 26.0 - 34.0 pg   MCHC 35.4 30.0 - 36.0 g/dL   RDW 12.6 11.5 - 15.5 %   Platelets 171 150 - 400 K/uL   nRBC 0.0 0.0 - 0.2 %    Comment: Performed at [facility], [address] [place], [place] [address]  Urinalysis, Routine w reflex microscopic Urine, Clean Catch     Status: Abnormal   Collection Time: 03/12/20 12:34 PM  Result Value  Ref Range   Color, Urine YELLOW YELLOW   APPearance CLEAR CLEAR   Specific Gravity, Urine 1.015 1.005 - 1.030   pH 7.0 5.0 - 8.0   Glucose, UA NEGATIVE NEGATIVE mg/dL   Hgb urine dipstick MODERATE (A) NEGATIVE   Bilirubin Urine NEGATIVE NEGATIVE   Ketones, ur NEGATIVE NEGATIVE mg/dL   Protein, ur 30 (A) NEGATIVE mg/dL   Nitrite NEGATIVE NEGATIVE   Leukocytes,Ua NEGATIVE NEGATIVE    Comment: Performed at [facility], [address] [place], [place] [address]  Urinalysis, Microscopic (reflex)     Status: Abnormal   Collection Time: 03/12/20 12:34 PM  Result Value Ref Range   RBC / HPF 11-20 0 - 5 RBC/hpf   WBC, UA 0-5 0 - 5 WBC/hpf   Bacteria, UA FEW (A) NONE SEEN   Squamous Epithelial / LPF 0-5 0 - 5    Comment: Performed at [facility], [address] [place], [place] [address]  SARS Coronavirus 2 by RT PCR (hospital order, performed in [place] hospital lab) Nasopharyngeal Nasopharyngeal Swab     Status: None   Collection Time: 03/12/20  3:46 PM   Specimen: Nasopharyngeal Swab  Result Value Ref Range   SARS Coronavirus 2 NEGATIVE NEGATIVE    Comment: (NOTE) SARS-CoV-2 target nucleic acids are NOT DETECTED.  The SARS-CoV-2 RNA is generally detectable in upper and lower respiratory  specimens during the acute phase of infection. The lowest concentration of SARS-CoV-2 viral copies this assay can detect is 250 copies / mL. A negative result does not preclude SARS-CoV-2 infection and should not be used as the sole basis for treatment or other patient management decisions.  A negative result may occur with improper specimen collection / handling, submission of specimen other than nasopharyngeal swab, presence of viral mutation(s) within the areas targeted by this assay, and inadequate number of viral copies (<250 copies / mL). A negative result must be combined with clinical observations, patient history, and epidemiological information.  Fact Sheet for  Patients:   StrictlyIdeas.no  Fact Sheet for Healthcare Providers: BankingDealers.co.za  This test is not yet approved or  cleared by the [place] FDA and has been authorized for detection and/or diagnosis of SARS-CoV-2 by FDA under an Emergency Use Authorization (EUA).  This EUA will remain in effect (meaning this test can be used) for the duration of the COVID-19 declaration under Section 564(b)(1) of the Act, 21 U.S.C. section 360bbb-3(b)(1), unless the authorization is terminated or revoked sooner.  Performed at [facility], [address] [place], [place] [address]     CT ABDOMEN PELVIS W CONTRAST  Result Date: 03/12/2020 CLINICAL DATA:  Abdominal pain and fever EXAM: CT ABDOMEN AND PELVIS WITH CONTRAST TECHNIQUE: Multidetector CT imaging of the abdomen and pelvis was performed using the standard protocol following bolus administration of intravenous contrast. CONTRAST:  158mL OMNIPAQUE IOHEXOL 300 MG/ML  SOLN COMPARISON:  Dec 13, 2009 FINDINGS: Lower chest: Lung bases are clear. Hepatobiliary: No focal liver lesions are appreciable. The gallbladder wall is not appreciably thickened. There is no biliary duct dilatation. Pancreas: There is no pancreatic mass or inflammatory focus. Spleen: No splenic lesions are evident. Adrenals/Urinary Tract: Adrenals bilaterally appear normal. There is a 9 mm cyst in the upper pole the right kidney laterally. There is a 6 mm cyst arising from the posterior lower pole left kidney. No hydronephrosis evident on either side. There is no renal or ureteral calculus on either side. Urinary bladder wall thickness normal. Stomach/Bowel: There are multiple loops of mildly dilated fluid-filled small and large bowel, likely due to a degree of ileus. No focal transition zone to indicate bowel obstruction. No appreciable free air or portal venous air. The terminal ileum appears unremarkable.  Vascular/Lymphatic: No abdominal aortic aneurysm. No evident arterial vascular lesion. Major venous structures appear patent. There is no evident adenopathy in the abdomen or pelvis. Reproductive: Prostate and seminal vesicles are normal in size and contour. No pelvic mass. Other: The appendix appears prominent measuring 11 mm in diameter. There is no surrounding fluid or significant soft tissue stranding in this area. No abscess or perforation in this area of apparent appendiceal inflammation. There is equivocal thickening in the fat immediately adjacent to the appendix. No evident abscess or ascites in the abdomen or pelvis. Musculoskeletal: There are foci of degenerative change in the lumbar spine. No blastic or lytic bone lesions. No intramuscular lesions. IMPRESSION: 1.  Findings concerning for early appendicitis. Appendix: Location: Appendix arises from the cecum inferiorly and medially with the appendix located in the lower right pelvis near the superior acetabular level. Diameter: 11 mm Appendicolith: None Mucosal hyper-enhancement: Equivocal Extraluminal gas: None Periappendiceal collection: None. Minimal periappendiceal fat thickening. 2. No evident bowel obstruction. Suspect a degree of ileus with multiple loops of fluid-filled small and large bowel. 3. No renal or ureteral calculus. No hydronephrosis. Urinary bladder wall thickness  normal. Critical Value/emergent results were called by telephone at the time of interpretation on 03/12/2020 at 3:09 pm to provider [facility] , who verbally acknowledged these results. Electronically Signed   By: Lowella Grip III M.D.   On: 03/12/2020 15:10     A/P: Zackerie Sara is an 39 y.o. male with signs and symptoms associated with acute appendicitis.  I have recommended lap appendectomy.  Risks include bleeding, infection, damage to adjacent structures, leak of staple line and hernia.  I believe he understands this and wishes to proceed.   Rosario Adie, MD  [facility]

## 2020-03-12 NOTE — Transfer of Care (Signed)
Immediate Anesthesia Transfer of Care Note  Patient: Lee [name]  Procedure(s) Performed: APPENDECTOMY LAPAROSCOPIC (N/A )  Patient Location: PACU  Anesthesia Type:General  Level of Consciousness: drowsy and patient cooperative  Airway & Oxygen Therapy: Patient Spontanous Breathing and Patient connected to face mask oxygen  Post-op Assessment: Report given to RN and Post -op Vital signs reviewed and stable  Post vital signs: Reviewed and stable  Last Vitals:  Vitals Value Taken Time  BP 138/79 03/12/20 2109  Temp    Pulse 102 03/12/20 2109  Resp 19 03/12/20 2109  SpO2 100 % 03/12/20 2109    Last Pain:  Vitals:   03/12/20 1849  TempSrc: Oral  PainSc:          Complications: No complications documented.

## 2020-03-12 NOTE — ED Provider Notes (Signed)
MSE was initiated and I personally evaluated the patient and placed orders (if any) at  7:11 PM on March 12, 2020.  The patient appears stable so that the remainder of the MSE may be completed by another provider.  Patient transferred from [facility] for evaluation appendicitis.  Spoke with Dr. Marcello Moores will come admit   [name], MD 03/12/20 1911

## 2020-03-12 NOTE — Anesthesia Preprocedure Evaluation (Addendum)
Anesthesia Evaluation  Patient identified by MRN, date of birth, ID band Patient awake    Reviewed: Allergy & Precautions, NPO status , Patient's Chart, lab work & pertinent test results  Airway Mallampati: II  TM Distance: >3 FB Neck ROM: Full    Dental  (+) Teeth Intact, Dental Advisory Given   Pulmonary    breath sounds clear to auscultation       Cardiovascular hypertension,  Rhythm:Regular Rate:Normal     Neuro/Psych    GI/Hepatic   Endo/Other    Renal/GU      Musculoskeletal   Abdominal   Peds  Hematology   Anesthesia Other Findings   Reproductive/Obstetrics                            Anesthesia Physical Anesthesia Plan  ASA: III and emergent  Anesthesia Plan: General   Post-op Pain Management:    Induction: Intravenous, Rapid sequence and Cricoid pressure planned  PONV Risk Score and Plan: Ondansetron and Dexamethasone  Airway Management Planned: Oral ETT  Additional Equipment:   Intra-op Plan:   Post-operative Plan: Extubation in OR  Informed Consent: I have reviewed the patients History and Physical, chart, labs and discussed the procedure including the risks, benefits and alternatives for the proposed anesthesia with the patient or authorized representative who has indicated his/her understanding and acceptance.     Dental advisory given  Plan Discussed with: CRNA and Anesthesiologist  Anesthesia Plan Comments:        Anesthesia Quick Evaluation

## 2020-03-12 NOTE — ED Notes (Signed)
Patient arrived via carelink from [place] reports patient has abd pain x 2days and fever. Patient has history of bowel obstruction - patient found to have appendicitis and possible another bowel obstruction. Patient has had dilaudid, morphine, rocephin, flagyl and zofran. covid negative. Pain reported by carelink is 3 or 4. 20g IV right AC.

## 2020-03-12 NOTE — Op Note (Signed)
Lee [name] [id number]   PRE-OPERATIVE DIAGNOSIS:  ACUTE APPENDICITIS  POST-OPERATIVE DIAGNOSIS:  Purulent appendicitis   Procedure(s): APPENDECTOMY LAPAROSCOPIC    Surgeon(s): Leighton Ruff, MD  ASSISTANT: none   ANESTHESIA:   local and general  EBL:   5 ml  Delay start of Pharmacological VTE agent (>24hrs) due to surgical blood loss or risk of bleeding:  no  DRAINS: none   SPECIMEN:  Source of Specimen:  appendix  DISPOSITION OF SPECIMEN:  PATHOLOGY  COUNTS:  YES  PLAN OF CARE: Admit for overnight observation  PATIENT DISPOSITION:  PACU - hemodynamically stable.   INDICATIONS: Patient with concerning symptoms & work up suspicious for appendicitis.  Surgery was recommended:  The anatomy & physiology of the digestive tract was discussed.  The pathophysiology of appendicitis was discussed.  Natural history risks without surgery was discussed.   I feel the risks of no intervention will lead to serious problems that outweigh the operative risks; therefore, I recommended diagnostic laparoscopy with removal of appendix to remove the pathology.  Laparoscopic & open techniques were discussed.   I noted a good likelihood this will help address the problem.    Risks such as bleeding, infection, abscess, leak, reoperation, possible ostomy, hernia, heart attack, death, and other risks were discussed.  Goals of post-operative recovery were discussed as well.  We will work to minimize complications.  Questions were answered.  The patient expresses understanding & wishes to proceed with surgery.  OR FINDINGS: purulent appendicitis  DESCRIPTION:   The patient was identified & brought into the operating room. The patient was positioned supine with left arm tucked. SCDs were active during the entire case. The patient underwent general anesthesia without any difficulty.  A foley catheter was inserted under sterile conditions. The abdomen was prepped and draped in a sterile fashion.  A Surgical Timeout confirmed our plan.   I made a transverse incision through the superior umbilical fold.  I made a nick in the infraumbilical fascia and confirmed peritoneal entry.  I placed a stay suture and then the [facility] port.  We induced carbon dioxide insufflation.  Camera inspection revealed no injury.  I placed additional ports under direct laparoscopic visualization.  The appendix was noted in the RLQ with surrounding purulence.    I mobilized the terminal ileum to proximal ascending colon in a lateral to medial fashion.  I took care to avoid injuring any retroperitoneal structures.   I freed the appendix off its attachments to the ascending colon and cecal mesentery.  I elevated the appendix.  I was able to free off the base of the appendix, which was still viable.  I stapled the appendix off the cecum using a laparoscopic purple load stapler x2.  I took a healthy cuff viable cecum. I skeletonized & ligated the mesoappendix with a vascular load stapler.   I placed the appendix inside an EndoCatch bag and removed out the [place] port.  I did copious irrigation. Hemostasis was good in the mesoappendix, colon mesentery, and retroperitoneum. Staple line was intact on the cecum with no bleeding. I washed out the pelvis, retrohepatic space and right paracolic gutter.  Hemostasis is good. There was no perforation or injury.  Because the area cleaned up well after irrigation, I did not place a drain.  I aspirated the carbon dioxide. I removed the ports. I closed the umbilical fascia site using a 0 Vicryl stitch. I closed skin using 4-0 vicryl stitch.  Sterile dressings were applied.  Patient was  extubated and sent to the recovery room.  I discussed the operative findings with the patient's family. I suspect the patient is going used in the hospital at least overnight and will need antibiotics for 5 days. Questions answered. They expressed understanding and appreciation.

## 2020-03-12 NOTE — ED Provider Notes (Signed)
[facility] EMERGENCY DEPARTMENT Provider Note   CSN: [id number] Arrival date & time: 03/12/20  1159     History Chief Complaint  Patient presents with  . Abdominal Pain  . Fever    Lee [name] is a 39 y.o. male.  HPI   39 year old male with abdominal pain.  Onset 2 days ago.  Pain is diffuse.  Associated with fever up to 104.  Nausea and vomiting.  No diarrhea.  No urinary complaints.  Denies any prior abdominal surgical history.  No sick contacts that he is aware of.  No acute respiratory complaints.  Past Medical History:  Diagnosis Date  . Eczema   . Environmental allergies   . Essential hypertension   . HIV test positive ([place])   . 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 Shingles    Patient Active Problem List   Diagnosis Date Noted  . Morbid obesity ([place]) 02/20/2019  . Otitis 02/20/2019  . CKD (chronic kidney disease) 02/20/2019  . Syphilis 02/21/2018  . Hepatitis B immune 09/20/2016  . Essential hypertension 08/03/2016  . Shingles 08/08/2011  . TENDINITIS, LEFT WRIST 01/13/2009  . SYNOVITIS 04/09/2008  . 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 Allergic rhinitis 10/03/2007  . ABSCESS, SKIN 10/03/2007    Past Surgical History:  Procedure Laterality Date  . TYMPANOSTOMY TUBE PLACEMENT    . WISDOM TOOTH EXTRACTION    . WRIST SURGERY        Family History  Problem Relation Age of Onset  . Hypercalcemia Mother   . Hypertension Mother   . Hypertension Father     Social History   Tobacco Use  . Smoking status: Never Smoker  . Smokeless tobacco: Never Used  Vaping Use  . Vaping Use: Never used  Substance Use Topics  . Alcohol use: No  . Drug use: No    Home Medications Prior to Admission medications   Medication Sig Start Date End Date Taking? Authorizing Provider  albuterol (PROVENTIL HFA;VENTOLIN HFA) 108 (90 Base) MCG/ACT inhaler Inhale 2 puffs into the lungs every 6 (six) hours as  needed for wheezing or shortness of breath. 09/14/17   Nafziger, Tommi Rumps, NP  amLODipine (NORVASC) 10 MG tablet Take 1 tablet (10 mg total) by mouth daily. 11/06/19   Campbell Riches, MD  azelastine (ASTELIN) 0.1 % nasal spray 1-2 puffs in each nostril twice daily    Mosetta Anis, MD  bictegravir-emtricitabine-tenofovir AF ([name]) 50-200-25 MG TABS tablet Take 1 tablet by mouth daily. 12/17/19   Campbell Riches, MD  EPINEPHrine (AUVI-Q) 0.3 mg/0.3 mL IJ SOAJ injection Inject 0.3 mg into the muscle once.    Mosetta Anis, MD  fluticasone [facility]) 50 MCG/ACT nasal spray 1-2 sprays in each nare once daily    Mosetta Anis, MD  guaiFENesin (MUCINEX) 600 MG 12 hr tablet Take 1 tablet (600 mg total) by mouth 2 (two) times daily as needed. 09/12/17   Debbe Odea, MD  OZEMPIC, 1 MG/DOSE, 2 MG/1.5ML SOPN Inject 1 mg into the skin once a week. 12/02/19   [provider]  phentermine (ADIPEX-P) 37.5 MG tablet Take 37.5-56.25 mg by mouth daily. 06/26/19   [provider]  phentermine 30 MG capsule Take 1 capsule (30 mg total) by mouth every morning. Patient not taking: Reported on 12/05/2019 07/04/19   Campbell Riches, MD  sodium chloride (OCEAN) 0.65 % SOLN nasal spray Place 1 spray into both nostrils as needed for congestion. 09/12/17  Debbe Odea, MD  topiramate (TOPAMAX) 100 MG tablet Take 100 mg by mouth at bedtime. 06/06/19   [provider]  Vitamin D, Ergocalciferol, (DRISDOL) 1.25 MG (50000 UT) CAPS capsule Take 50,000 Units by mouth once a week. 06/06/19   [provider]    Allergies    Lisinopril and Nickel  Review of Systems   Review of Systems All systems reviewed and negative, other than as noted in HPI.  Physical Exam Updated Vital Signs BP 131/67 (BP Location: Right Arm)   Pulse (!) 118   Temp 98.9 F (37.2 C) (Oral)   Resp 20   Ht 6\' 4"  (1.93 m)   Wt 109.8 kg   SpO2 99%   BMI 29.46 kg/m   Physical Exam Vitals and nursing note  reviewed.  Constitutional:      General: He is not in acute distress.    Appearance: He is well-developed.  HENT:     Head: Normocephalic and atraumatic.  Eyes:     General:        Right eye: No discharge.        Left eye: No discharge.     Conjunctiva/sclera: Conjunctivae normal.  Cardiovascular:     Rate and Rhythm: Normal rate and regular rhythm.     Heart sounds: Normal heart sounds. No murmur heard.  No friction rub. No gallop.   Pulmonary:     Effort: Pulmonary effort is normal. No respiratory distress.     Breath sounds: Normal breath sounds.  Abdominal:     General: There is no distension.     Palpations: Abdomen is soft.     Tenderness: There is abdominal tenderness in the periumbilical area. There is no guarding or rebound.  Musculoskeletal:        General: No tenderness.     Cervical back: Neck supple.  Skin:    General: Skin is warm and dry.  Neurological:     Mental Status: He is alert.  Psychiatric:        Behavior: Behavior normal.        Thought Content: Thought content normal.    ED Results / Procedures / Treatments   Labs (all labs ordered are listed, but only abnormal results are displayed) Labs Reviewed  COMPREHENSIVE METABOLIC PANEL - Abnormal; Notable for the following components:      Result Value   Sodium 132 (*)    CO2 17 (*)    Glucose, Bld 106 (*)    Creatinine, Ser 1.47 (*)    Total Protein 8.5 (*)    AST 13 (*)    Total Bilirubin 2.6 (*)    All other components within normal limits  CBC - Abnormal; Notable for the following components:   WBC 15.4 (*)    All other components within normal limits  URINALYSIS, ROUTINE W REFLEX MICROSCOPIC - Abnormal; Notable for the following components:   Hgb urine dipstick MODERATE (*)    Protein, ur 30 (*)    All other components within normal limits  URINALYSIS, MICROSCOPIC (REFLEX) - Abnormal; Notable for the following components:   Bacteria, UA FEW (*)    All other components within normal  limits  SARS CORONAVIRUS 2 BY RT PCR [facility] ORDER, [employer] LAB)  LIPASE, BLOOD    EKG EKG Interpretation  Date/Time:  Friday March 12 2020 12:21:53 EDT Ventricular Rate:  116 PR Interval:  146 QRS Duration: 86 QT Interval:  284 QTC Calculation: 394 R Axis:  90 Text Interpretation: Sinus tachycardia Biatrial enlargement Rightward axis Abnormal ECG Confirmed by Virgel Manifold [phone]) on 03/12/2020 1:19:29 PM   Radiology CT ABDOMEN PELVIS W CONTRAST  Result Date: 03/12/2020 CLINICAL DATA:  Abdominal pain and fever EXAM: CT ABDOMEN AND PELVIS WITH CONTRAST TECHNIQUE: Multidetector CT imaging of the abdomen and pelvis was performed using the standard protocol following bolus administration of intravenous contrast. CONTRAST:  132mL OMNIPAQUE IOHEXOL 300 MG/ML  SOLN COMPARISON:  Dec 13, 2009 FINDINGS: Lower chest: Lung bases are clear. Hepatobiliary: No focal liver lesions are appreciable. The gallbladder wall is not appreciably thickened. There is no biliary duct dilatation. Pancreas: There is no pancreatic mass or inflammatory focus. Spleen: No splenic lesions are evident. Adrenals/Urinary Tract: Adrenals bilaterally appear normal. There is a 9 mm cyst in the upper pole the right kidney laterally. There is a 6 mm cyst arising from the posterior lower pole left kidney. No hydronephrosis evident on either side. There is no renal or ureteral calculus on either side. Urinary bladder wall thickness normal. Stomach/Bowel: There are multiple loops of mildly dilated fluid-filled small and large bowel, likely due to a degree of ileus. No focal transition zone to indicate bowel obstruction. No appreciable free air or portal venous air. The terminal ileum appears unremarkable. Vascular/Lymphatic: No abdominal aortic aneurysm. No evident arterial vascular lesion. Major venous structures appear patent. There is no evident adenopathy in the abdomen or pelvis. Reproductive: Prostate  and seminal vesicles are normal in size and contour. No pelvic mass. Other: The appendix appears prominent measuring 11 mm in diameter. There is no surrounding fluid or significant soft tissue stranding in this area. No abscess or perforation in this area of apparent appendiceal inflammation. There is equivocal thickening in the fat immediately adjacent to the appendix. No evident abscess or ascites in the abdomen or pelvis. Musculoskeletal: There are foci of degenerative change in the lumbar spine. No blastic or lytic bone lesions. No intramuscular lesions. IMPRESSION: 1.  Findings concerning for early appendicitis. Appendix: Location: Appendix arises from the cecum inferiorly and medially with the appendix located in the lower right pelvis near the superior acetabular level. Diameter: 11 mm Appendicolith: None Mucosal hyper-enhancement: Equivocal Extraluminal gas: None Periappendiceal collection: None. Minimal periappendiceal fat thickening. 2. No evident bowel obstruction. Suspect a degree of ileus with multiple loops of fluid-filled small and large bowel. 3. No renal or ureteral calculus. No hydronephrosis. Urinary bladder wall thickness normal. Critical Value/emergent results were called by telephone at the time of interpretation on 03/12/2020 at 3:09 pm to provider [facility] , who verbally acknowledged these results. Electronically Signed   By: Lowella Grip III M.D.   On: 03/12/2020 15:10    Procedures Procedures (including critical care time)  Medications Ordered in ED Medications  ondansetron (ZOFRAN) injection 4 mg (4 mg Intravenous Given 03/12/20 1414)  HYDROmorphone (DILAUDID) injection 1 mg (1 mg Intravenous Given 03/12/20 1416)  lactated ringers bolus 1,000 mL (1,000 mLs Intravenous New Bag/Given 03/12/20 1414)  iohexol (OMNIPAQUE) 300 MG/ML solution 100 mL (100 mLs Intravenous Contrast Given 03/12/20 1436)    ED Course  I have reviewed the triage vital signs and the nursing  notes.  Pertinent labs & imaging results that were available during my care of the patient were reviewed by me and considered in my medical decision making (see chart for details).    MDM Rules/Calculators/A&P  38 year old male with abdominal pain.  Imaging significant for appendicitis.  Discussed with Dr. Johney Maine, general surgery.  Abx.   ED-to-ED transfer to [facility].  Briefly discussed with Dr. Hillard Danker. Pt updated.   Final Clinical Impression(s) / ED Diagnoses Final diagnoses:  Acute appendicitis, unspecified acute appendicitis type    Rx / DC Orders ED Discharge Orders    None       Virgel Manifold, MD 03/12/20 1546

## 2020-03-12 NOTE — Anesthesia Procedure Notes (Signed)
Procedure Name: Intubation Date/Time: 03/12/2020 8:08 PM Performed by: Montel Clock, CRNA Pre-anesthesia Checklist: Patient identified, Emergency Drugs available, Suction available, Patient being monitored and Timeout performed Patient Re-evaluated:Patient Re-evaluated prior to induction Oxygen Delivery Method: Circle system utilized Preoxygenation: Pre-oxygenation with 100% oxygen Induction Type: IV induction, Rapid sequence and Cricoid Pressure applied Laryngoscope Size: Mac and 4 Grade View: Grade II Tube type: Oral Tube size: 7.5 mm Number of attempts: 1 Airway Equipment and Method: Stylet Placement Confirmation: ETT inserted through vocal cords under direct vision,  positive ETCO2 and breath sounds checked- equal and bilateral Secured at: 23 cm Tube secured with: Tape Dental Injury: Teeth and Oropharynx as per pre-operative assessment

## 2020-03-13 MED ORDER — METRONIDAZOLE IN NACL 5-0.79 MG/ML-% IV SOLN
500.0000 mg | Freq: Four times a day (QID) | INTRAVENOUS | Status: DC
  Administered 2020-03-13 – 2020-03-15 (×8): 500 mg via INTRAVENOUS
  Filled 2020-03-13 (×8): qty 100

## 2020-03-13 MED ORDER — SODIUM CHLORIDE 0.9 % IV SOLN
2.0000 g | INTRAVENOUS | Status: DC
  Administered 2020-03-13 – 2020-03-16 (×4): 2 g via INTRAVENOUS
  Filled 2020-03-13 (×4): qty 2

## 2020-03-13 NOTE — Progress Notes (Signed)
1 Day Post-Op lap appy Subjective: Tolerating sips of clears, feels bloated  Objective: Vital signs in last 24 hours: Temp:  [97.8 F (36.6 C)-101.7 F (38.7 C)] 98.1 F (36.7 C) (08/21 0600) Pulse Rate:  [67-118] 68 (08/21 0600) Resp:  [15-36] 16 (08/21 0600) BP: (118-138)/(67-89) 130/78 (08/21 0600) SpO2:  [96 %-100 %] 100 % (08/21 0600) Weight:  [109.8 kg] 109.8 kg (08/20 1216)   Intake/Output from previous day: 08/20 0701 - 08/21 0700 In: 3623.6 [P.O.:360; I.V.:1705; IV Piggyback:1558.6] Out: 1730 [Urine:1450; Blood:5] Intake/Output this shift: No intake/output data recorded.   General appearance: alert and cooperative Abd: soft Incision: no significant drainage  Lab Results:  Recent Labs    03/12/20 1233  WBC 15.4*  HGB 16.1  HCT 45.5  PLT 171   BMET Recent Labs    03/12/20 1233  NA 132*  K 3.8  CL 106  CO2 17*  GLUCOSE 106*  BUN 17  CREATININE 1.47*  CALCIUM 8.9   PT/INR No results for input(s): LABPROT, INR in the last 72 hours. ABG No results for input(s): PHART, HCO3 in the last 72 hours.  Invalid input(s): PCO2, PO2  MEDS, Scheduled . acetaminophen  1,000 mg Oral Q6H  . amLODipine  10 mg Oral Daily  . bictegravir-emtricitabine-tenofovir AF  1 tablet Oral Daily  . enoxaparin (LOVENOX) injection  40 mg Subcutaneous Q24H  . fentaNYL      . gabapentin  300 mg Oral BID  . senna-docusate  1 tablet Oral Once  . topiramate  100 mg Oral QHS    Studies/Results: CT ABDOMEN PELVIS W CONTRAST  Result Date: 03/12/2020 CLINICAL DATA:  Abdominal pain and fever EXAM: CT ABDOMEN AND PELVIS WITH CONTRAST TECHNIQUE: Multidetector CT imaging of the abdomen and pelvis was performed using the standard protocol following bolus administration of intravenous contrast. CONTRAST:  168mL OMNIPAQUE IOHEXOL 300 MG/ML  SOLN COMPARISON:  Dec 13, 2009 FINDINGS: Lower chest: Lung bases are clear. Hepatobiliary: No focal liver lesions are appreciable. The gallbladder  wall is not appreciably thickened. There is no biliary duct dilatation. Pancreas: There is no pancreatic mass or inflammatory focus. Spleen: No splenic lesions are evident. Adrenals/Urinary Tract: Adrenals bilaterally appear normal. There is a 9 mm cyst in the upper pole the right kidney laterally. There is a 6 mm cyst arising from the posterior lower pole left kidney. No hydronephrosis evident on either side. There is no renal or ureteral calculus on either side. Urinary bladder wall thickness normal. Stomach/Bowel: There are multiple loops of mildly dilated fluid-filled small and large bowel, likely due to a degree of ileus. No focal transition zone to indicate bowel obstruction. No appreciable free air or portal venous air. The terminal ileum appears unremarkable. Vascular/Lymphatic: No abdominal aortic aneurysm. No evident arterial vascular lesion. Major venous structures appear patent. There is no evident adenopathy in the abdomen or pelvis. Reproductive: Prostate and seminal vesicles are normal in size and contour. No pelvic mass. Other: The appendix appears prominent measuring 11 mm in diameter. There is no surrounding fluid or significant soft tissue stranding in this area. No abscess or perforation in this area of apparent appendiceal inflammation. There is equivocal thickening in the fat immediately adjacent to the appendix. No evident abscess or ascites in the abdomen or pelvis. Musculoskeletal: There are foci of degenerative change in the lumbar spine. No blastic or lytic bone lesions. No intramuscular lesions. IMPRESSION: 1.  Findings concerning for early appendicitis. Appendix: Location: Appendix arises from the cecum inferiorly and  medially with the appendix located in the lower right pelvis near the superior acetabular level. Diameter: 11 mm Appendicolith: None Mucosal hyper-enhancement: Equivocal Extraluminal gas: None Periappendiceal collection: None. Minimal periappendiceal fat thickening. 2. No  evident bowel obstruction. Suspect a degree of ileus with multiple loops of fluid-filled small and large bowel. 3. No renal or ureteral calculus. No hydronephrosis. Urinary bladder wall thickness normal. Critical Value/emergent results were called by telephone at the time of interpretation on 03/12/2020 at 3:09 pm to provider [facility] , who verbally acknowledged these results. Electronically Signed   By: Lowella Grip III M.D.   On: 03/12/2020 15:10    Assessment: s/p Procedure(s): APPENDECTOMY LAPAROSCOPIC Patient Active Problem List   Diagnosis Date Noted  . Acute appendicitis 03/12/2020  . Purulent appendicitis 03/12/2020  . Otitis 02/20/2019  . CKD (chronic kidney disease) stage 3, GFR 30-59 ml/min 02/20/2019  . Syphilis 02/21/2018  . Hepatitis B immune 09/20/2016  . Essential hypertension 08/03/2016  . Shingles 08/08/2011  . TENDINITIS, LEFT WRIST 01/13/2009  . SYNOVITIS 04/09/2008  . 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[place]) 10/16/2007  . Allergic rhinitis 10/03/2007  . ABSCESS, SKIN 10/03/2007    Possible post op ileus  Plan: Cont clears for now  Ambulate   LOS: 0 days     .Rosario Adie, [facility], [place]    03/13/2020 9:02 AM

## 2020-03-14 DIAGNOSIS — K3580 Unspecified acute appendicitis: Secondary | ICD-10-CM | POA: Diagnosis present

## 2020-03-14 DIAGNOSIS — Z8249 Family history of ischemic heart disease and other diseases of the circulatory system: Secondary | ICD-10-CM | POA: Diagnosis not present

## 2020-03-14 DIAGNOSIS — Z79899 Other long term (current) drug therapy: Secondary | ICD-10-CM | POA: Diagnosis not present

## 2020-03-14 DIAGNOSIS — K3532 Acute appendicitis with perforation and localized peritonitis, without abscess: Secondary | ICD-10-CM | POA: Diagnosis present

## 2020-03-14 DIAGNOSIS — I129 Hypertensive chronic kidney disease with stage 1 through stage 4 chronic kidney disease, or unspecified chronic kidney disease: Secondary | ICD-10-CM | POA: Diagnosis present

## 2020-03-14 DIAGNOSIS — Z20822 Contact with and (suspected) exposure to covid-19: Secondary | ICD-10-CM | POA: Diagnosis present

## 2020-03-14 DIAGNOSIS — N1832 Chronic kidney disease, stage 3b: Secondary | ICD-10-CM | POA: Diagnosis present

## 2020-03-14 DIAGNOSIS — Z21 Asymptomatic human immunodeficiency virus [HIV] infection status: Secondary | ICD-10-CM | POA: Diagnosis present

## 2020-03-14 DIAGNOSIS — K567 Ileus, unspecified: Secondary | ICD-10-CM | POA: Diagnosis not present

## 2020-03-14 DIAGNOSIS — Z888 Allergy status to other drugs, medicaments and biological substances status: Secondary | ICD-10-CM | POA: Diagnosis not present

## 2020-03-14 DIAGNOSIS — E871 Hypo-osmolality and hyponatremia: Secondary | ICD-10-CM | POA: Diagnosis present

## 2020-03-14 NOTE — Progress Notes (Addendum)
2 Days Post-Op lap appy Subjective: Tolerating some clears, feels bloated  Objective: Vital signs in last 24 hours: Temp:  [97.6 F (36.4 C)-98.8 F (37.1 C)] 98.1 F (36.7 C) (08/22 0616) Pulse Rate:  [70-76] 76 (08/22 0616) Resp:  [15-18] 15 (08/22 0616) BP: (119-134)/(69-84) 119/69 (08/22 0616) SpO2:  [100 %] 100 % (08/22 0616)   Intake/Output from previous day: 08/21 0701 - 08/22 0700 In: 2736.1 [P.O.:1160; I.V.:1476.1; IV Piggyback:100] Out: 1100 [Urine:600; Emesis/NG output:500] Intake/Output this shift: Total I/O In: 75.3 [I.V.:75.3] Out: -    General appearance: alert and cooperative Abd: soft Incision: no significant drainage  Lab Results:  Recent Labs    03/12/20 1233  WBC 15.4*  HGB 16.1  HCT 45.5  PLT 171   BMET Recent Labs    03/12/20 1233  NA 132*  K 3.8  CL 106  CO2 17*  GLUCOSE 106*  BUN 17  CREATININE 1.47*  CALCIUM 8.9   PT/INR No results for input(s): LABPROT, INR in the last 72 hours. ABG No results for input(s): PHART, HCO3 in the last 72 hours.  Invalid input(s): PCO2, PO2  MEDS, Scheduled . acetaminophen  1,000 mg Oral Q6H  . amLODipine  10 mg Oral Daily  . bictegravir-emtricitabine-tenofovir AF  1 tablet Oral Daily  . enoxaparin (LOVENOX) injection  40 mg Subcutaneous Q24H  . gabapentin  300 mg Oral BID  . topiramate  100 mg Oral QHS    Studies/Results: CT ABDOMEN PELVIS W CONTRAST  Result Date: 03/12/2020 CLINICAL DATA:  Abdominal pain and fever EXAM: CT ABDOMEN AND PELVIS WITH CONTRAST TECHNIQUE: Multidetector CT imaging of the abdomen and pelvis was performed using the standard protocol following bolus administration of intravenous contrast. CONTRAST:  142mL OMNIPAQUE IOHEXOL 300 MG/ML  SOLN COMPARISON:  Dec 13, 2009 FINDINGS: Lower chest: Lung bases are clear. Hepatobiliary: No focal liver lesions are appreciable. The gallbladder wall is not appreciably thickened. There is no biliary duct dilatation. Pancreas: There is  no pancreatic mass or inflammatory focus. Spleen: No splenic lesions are evident. Adrenals/Urinary Tract: Adrenals bilaterally appear normal. There is a 9 mm cyst in the upper pole the right kidney laterally. There is a 6 mm cyst arising from the posterior lower pole left kidney. No hydronephrosis evident on either side. There is no renal or ureteral calculus on either side. Urinary bladder wall thickness normal. Stomach/Bowel: There are multiple loops of mildly dilated fluid-filled small and large bowel, likely due to a degree of ileus. No focal transition zone to indicate bowel obstruction. No appreciable free air or portal venous air. The terminal ileum appears unremarkable. Vascular/Lymphatic: No abdominal aortic aneurysm. No evident arterial vascular lesion. Major venous structures appear patent. There is no evident adenopathy in the abdomen or pelvis. Reproductive: Prostate and seminal vesicles are normal in size and contour. No pelvic mass. Other: The appendix appears prominent measuring 11 mm in diameter. There is no surrounding fluid or significant soft tissue stranding in this area. No abscess or perforation in this area of apparent appendiceal inflammation. There is equivocal thickening in the fat immediately adjacent to the appendix. No evident abscess or ascites in the abdomen or pelvis. Musculoskeletal: There are foci of degenerative change in the lumbar spine. No blastic or lytic bone lesions. No intramuscular lesions. IMPRESSION: 1.  Findings concerning for early appendicitis. Appendix: Location: Appendix arises from the cecum inferiorly and medially with the appendix located in the lower right pelvis near the superior acetabular level. Diameter: 11 mm Appendicolith: None  Mucosal hyper-enhancement: Equivocal Extraluminal gas: None Periappendiceal collection: None. Minimal periappendiceal fat thickening. 2. No evident bowel obstruction. Suspect a degree of ileus with multiple loops of fluid-filled  small and large bowel. 3. No renal or ureteral calculus. No hydronephrosis. Urinary bladder wall thickness normal. Critical Value/emergent results were called by telephone at the time of interpretation on 03/12/2020 at 3:09 pm to provider [facility] , who verbally acknowledged these results. Electronically Signed   By: Lowella Grip III M.D.   On: 03/12/2020 15:10    Assessment: s/p Procedure(s): APPENDECTOMY LAPAROSCOPIC Patient Active Problem List   Diagnosis Date Noted  . Acute appendicitis 03/12/2020  . Purulent appendicitis 03/12/2020  . Otitis 02/20/2019  . CKD (chronic kidney disease) stage 3, GFR 30-59 ml/min 02/20/2019  . Syphilis 02/21/2018  . Hepatitis B immune 09/20/2016  . Essential hypertension 08/03/2016  . Shingles 08/08/2011  . TENDINITIS, LEFT WRIST 01/13/2009  . SYNOVITIS 04/09/2008  . 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 Allergic rhinitis 10/03/2007  . ABSCESS, SKIN 10/03/2007    Possible post op ileus  Plan: Cont clears/fulls for now  Ambulate Cont Abx, IVF's   LOS: 0 days     .Rosario Adie, MD [facility], [place]    03/14/2020 8:33 AM

## 2020-03-15 ENCOUNTER — Encounter (HOSPITAL_COMMUNITY): Payer: Self-pay | Admitting: General Surgery

## 2020-03-15 LAB — BASIC METABOLIC PANEL
Anion gap: 10 (ref 5–15)
BUN: 19 mg/dL (ref 6–20)
CO2: 19 mmol/L — ABNORMAL LOW (ref 22–32)
Calcium: 8.8 mg/dL — ABNORMAL LOW (ref 8.9–10.3)
Chloride: 108 mmol/L (ref 98–111)
Creatinine, Ser: 1.29 mg/dL — ABNORMAL HIGH (ref 0.61–1.24)
GFR calc Af Amer: >60 mL/min (ref >60)
GFR calc non Af Amer: >60 mL/min (ref >60)
Glucose, Bld: 84 mg/dL (ref 70–99)
Potassium: 3.7 mmol/L (ref 3.5–5.1)
Sodium: 137 mmol/L (ref 135–145)

## 2020-03-15 LAB — CBC
HCT: 42.6 % (ref 39.0–52.0)
Hemoglobin: 14.9 g/dL (ref 13.0–17.0)
MCH: 31.6 pg (ref 26.0–34.0)
MCHC: 35 g/dL (ref 30.0–36.0)
MCV: 90.4 fL (ref 80.0–100.0)
Platelets: 197 10*3/uL (ref 150–400)
RBC: 4.71 MIL/uL (ref 4.22–5.81)
RDW: 13.1 % (ref 11.5–15.5)
WBC: 7.8 10*3/uL (ref 4.0–10.5)
nRBC: 0 % (ref 0.0–0.2)

## 2020-03-15 MED ORDER — METRONIDAZOLE IN NACL 5-0.79 MG/ML-% IV SOLN
500.0000 mg | Freq: Three times a day (TID) | INTRAVENOUS | Status: DC
  Administered 2020-03-15 – 2020-03-16 (×4): 500 mg via INTRAVENOUS
  Filled 2020-03-15 (×4): qty 100

## 2020-03-15 MED ORDER — BISACODYL 10 MG RE SUPP
10.0000 mg | Freq: Once | RECTAL | Status: AC
  Administered 2020-03-15: 10 mg via RECTAL
  Filled 2020-03-15: qty 1

## 2020-03-15 NOTE — Progress Notes (Signed)
PHARMACY NOTE:  ANTIMICROBIAL RENAL DOSAGE ADJUSTMENT  Current antimicrobial regimen includes a mismatch between antimicrobial dosage and estimated renal function.  As per policy approved by the Pharmacy & Therapeutics and Medical Executive Committees, the antimicrobial dosage will be adjusted accordingly.  Current antimicrobial dosage:  Metronidazole 500mg  IV q6h  Indication: Intra-abdominal infection  Renal Function:  Estimated Creatinine Clearance: 93.4 mL/min (A) (by C-G formula based on SCr of 1.47 mg/dL (H)).    Antimicrobial dosage has been changed to:  Metronidazole 500mg  IV q8h   Thank you for allowing pharmacy to be a part of this patient's care.  Gretta Arab PharmD, BCPS Clinical Pharmacist [facility] main pharmacy [phone] 03/15/2020 8:53 AM

## 2020-03-15 NOTE — Progress Notes (Signed)
[facility] Progress Note  3 Days Post-Op  Subjective: Patient reports he is still sore and cramping but passed a little flatus this AM. Wanting to get up and walk and see if he passes more. Still somewhat nauseated and has thrown up daily since surgery.   Objective: Vital signs in last 24 hours: Temp:  [98.4 F (36.9 C)-99.1 F (37.3 C)] 99.1 F (37.3 C) (08/23 0602) Pulse Rate:  [76-79] 79 (08/23 0602) Resp:  [17-18] 17 (08/23 0602) BP: (117-137)/(72-82) 137/82 (08/23 0602) SpO2:  [98 %-100 %] 98 % (08/23 0602) Last BM Date: 03/11/20  Intake/Output from previous day: 08/22 0701 - 08/23 0700 In: 2941 [P.O.:720; I.V.:1321; IV Piggyback:900] Out: 1500 [Urine:1300; Emesis/NG output:200] Intake/Output this shift: No intake/output data recorded.  PE: General: pleasant, WD, WN male who is sitting up in chair Heart: regular, rate, and rhythm. Palpable radial and pedal pulses bilaterally Lungs: CTAB, no wheezes, rhonchi, or rales noted.  Respiratory effort nonlabored Abd: soft, appropriately ttp, distended, BS hypoactive    Lab Results:  Recent Labs    03/12/20 1233 03/15/20 0425  WBC 15.4* 7.8  HGB 16.1 14.9  HCT 45.5 42.6  PLT 171 197   BMET Recent Labs    03/12/20 1233  NA 132*  K 3.8  CL 106  CO2 17*  GLUCOSE 106*  BUN 17  CREATININE 1.47*  CALCIUM 8.9   PT/INR No results for input(s): LABPROT, INR in the last 72 hours. CMP     Component Value Date/Time   NA 132 (L) 03/12/2020 1233   K 3.8 03/12/2020 1233   CL 106 03/12/2020 1233   CO2 17 (L) 03/12/2020 1233   GLUCOSE 106 (H) 03/12/2020 1233   BUN 17 03/12/2020 1233   CREATININE 1.47 (H) 03/12/2020 1233   CREATININE 1.83 (H) 12/05/2019 1203   CALCIUM 8.9 03/12/2020 1233   PROT 8.5 (H) 03/12/2020 1233   ALBUMIN 4.1 03/12/2020 1233   AST 13 (L) 03/12/2020 1233   ALT 12 03/12/2020 1233   ALKPHOS 55 03/12/2020 1233   BILITOT 2.6 (H) 03/12/2020 1233   GFRNONAA >60 03/12/2020 1233    GFRNONAA 45 (L) 03/09/2017 1004   GFRAA >60 03/12/2020 1233   GFRAA 52 (L) 03/09/2017 1004   Lipase     Component Value Date/Time   LIPASE 41 03/12/2020 1233       Studies/Results: No results found.  Anti-infectives: Anti-infectives (From admission, onward)   Start     Dose/Rate Route Frequency Ordered Stop   03/15/20 1000  metroNIDAZOLE (FLAGYL) IVPB 500 mg        500 mg 100 mL/hr over 60 Minutes Intravenous Every 8 hours 03/15/20 0915     03/13/20 1500  cefTRIAXone (ROCEPHIN) 2 g in sodium chloride 0.9 % 100 mL IVPB       Note to Pharmacy: Pharmacy may adjust dosing strength / duration / interval for maximal efficacy   2 g 200 mL/hr over 30 Minutes Intravenous Every 24 hours 03/13/20 0912     03/13/20 1000  bictegravir-emtricitabine-tenofovir AF ([name]) 50-200-25 MG per tablet 1 tablet        1 tablet Oral Daily 03/12/20 2237     03/13/20 1000  metroNIDAZOLE (FLAGYL) IVPB 500 mg  Status:  Discontinued        500 mg 100 mL/hr over 60 Minutes Intravenous Every 6 hours 03/13/20 0912 03/15/20 0915   03/12/20 2245  metroNIDAZOLE (FLAGYL) IVPB 500 mg        500  mg 100 mL/hr over 60 Minutes Intravenous Every 8 hours 03/12/20 2237 03/13/20 0023   03/12/20 1530  cefTRIAXone (ROCEPHIN) 1 g in sodium chloride 0.9 % 100 mL IVPB        1 g 200 mL/hr over 30 Minutes Intravenous  Once 03/12/20 1521 03/12/20 1628   03/12/20 1530  metroNIDAZOLE (FLAGYL) IVPB 500 mg        500 mg 100 mL/hr over 60 Minutes Intravenous  Once 03/12/20 1521 03/12/20 1759   03/12/20 1515  piperacillin-tazobactam (ZOSYN) IVPB 3.375 g  Status:  Discontinued        3.375 g 100 mL/hr over 30 Minutes Intravenous  Once 03/12/20 1514 03/12/20 1519       Assessment/Plan HIV - home meds HTN - home meds CKD stage III - monitor Cr  Perforated appendicitis S/p Laparoscopic appendectomy 03/12/20 Dr. Marcello Moores - POD#3 - still with mild ileus  - ok to have FLD as tolerated but advised to go slowly with PO  intake - mobilize - try suppository today   FEN: FLD as tolerated, IVF VTE: SCDs, lovenox ID: [email or web address]   LOS: 1 day    Norm Parcel , [facility] 03/15/2020, 9:23 AM Please see Amion for pager number during day hours 7:00am-4:30pm

## 2020-03-16 ENCOUNTER — Encounter (HOSPITAL_COMMUNITY): Payer: Self-pay

## 2020-03-16 LAB — SURGICAL PATHOLOGY

## 2020-03-16 MED ORDER — CIPROFLOXACIN HCL 500 MG PO TABS: 500.0000 mg | ORAL_TABLET | Freq: Two times a day (BID) | ORAL | 0 refills | Status: DC

## 2020-03-16 MED ORDER — ACETAMINOPHEN 500 MG PO TABS: 1000.0000 mg | ORAL_TABLET | Freq: Four times a day (QID) | ORAL | Status: DC | PRN

## 2020-03-16 MED ORDER — HYDROCODONE-ACETAMINOPHEN 5-325 MG PO TABS: 1.0000 | ORAL_TABLET | Freq: Four times a day (QID) | ORAL | 0 refills | Status: DC | PRN

## 2020-03-16 MED ORDER — METRONIDAZOLE 500 MG PO TABS: 500.0000 mg | ORAL_TABLET | Freq: Two times a day (BID) | ORAL | 0 refills | Status: AC

## 2020-03-16 NOTE — Discharge Instructions (Signed)
[facility] LAPAROSCOPIC SURGERY: POST OP INSTRUCTIONS Always review your discharge instruction sheet given to you by the facility where your surgery was performed. IF YOU HAVE DISABILITY OR FAMILY LEAVE FORMS, YOU MUST BRING THEM TO THE OFFICE FOR PROCESSING.   DO NOT GIVE THEM TO YOUR DOCTOR.  PAIN CONTROL  1. First take acetaminophen (Tylenol) AND/or ibuprofen (Advil) to control your pain after surgery.  Follow directions on package.  Taking acetaminophen (Tylenol) and/or ibuprofen (Advil) regularly after surgery will help to control your pain and lower the amount of prescription pain medication you may need.  You should not take more than 3,000 mg (3 grams) of acetaminophen (Tylenol) in 24 hours.  You should not take ibuprofen (Advil), aleve, motrin, naprosyn or other NSAIDS if you have a history of stomach ulcers or chronic kidney disease.  2. A prescription for pain medication may be given to you upon discharge.  Take your pain medication as prescribed, if you still have uncontrolled pain after taking acetaminophen (Tylenol) or ibuprofen (Advil). 3. Use ice packs to help control pain. 4. If you need a refill on your pain medication, please contact your pharmacy.  They will contact our office to request authorization. Prescriptions will not be filled after 5pm or on week-ends.  HOME MEDICATIONS 5. Take your usually prescribed medications unless otherwise directed.  DIET 6. You should follow a light diet the first few days after arrival home.  Be sure to include lots of fluids daily. Avoid fatty, fried foods.   CONSTIPATION 7. It is common to experience some constipation after surgery and if you are taking pain medication.  Increasing fluid intake and taking a stool softener (such as Colace) will usually help or prevent this problem from occurring.  A mild laxative (Milk of Magnesia or Miralax) should be taken according to package instructions if there are no bowel  movements after 48 hours.  WOUND/INCISION CARE 8. Most patients will experience some swelling and bruising in the area of the incisions.  Ice packs will help.  Swelling and bruising can take several days to resolve.  9. Unless discharge instructions indicate otherwise, follow guidelines below  a. STERI-STRIPS - you may remove your outer bandages 48 hours after surgery, and you may shower at that time.  You have steri-strips (small skin tapes) in place directly over the incision.  These strips should be left on the skin for 7-10 days.   b. DERMABOND/SKIN GLUE - you may shower in 24 hours.  The glue will flake off over the next 2-3 weeks. 10. Any sutures or staples will be removed at the office during your follow-up visit.  ACTIVITIES 11. You may resume regular (light) daily activities beginning the next day--such as daily self-care, walking, climbing stairs--gradually increasing activities as tolerated.  You may have sexual intercourse when it is comfortable.  Refrain from any heavy lifting or straining until approved by your doctor. a. You may drive when you are no longer taking prescription pain medication, you can comfortably wear a seatbelt, and you can safely maneuver your car and apply brakes.  FOLLOW-UP 12. You should see your doctor in the office for a follow-up appointment approximately 2-3 weeks after your surgery.  You should have been given your post-op/follow-up appointment when your surgery was scheduled.  If you did not receive a post-op/follow-up appointment, make sure that you call for this appointment within a day or two after you arrive home to insure a convenient appointment time.     WHEN TO CALL YOUR DOCTOR: 1. Fever over 101.0 2. Inability to urinate 3. Continued bleeding from incision. 4. Increased pain, redness, or drainage from the incision. 5. Increasing abdominal pain  The clinic staff is available to answer your questions during regular business hours.  Please don't  hesitate to call and ask to speak to one of the nurses for clinical concerns.  If you have a medical emergency, go to the nearest emergency room or call 911.  A surgeon from [facility] is always on call at the hospital. [address] [place], [place], [place]  [address] ? [address], [place], [place]   [address] [phone] ? [phone] ? FAX [phone] Web site: [email or web address]   Ileus  Ileus is a condition that happens when the intestines, which are also called bowels, stop working correctly. The intestines are hollow organs that digest food after the food leaves the stomach. These organs are long, muscular tubes that connect the stomach to the rectum. When ileus occurs, the muscular contractions that cause food to move through the intestines do not happen as they normally would. If the intestines stop working, food cannot pass through to get digested. This condition is a serious problem that usually requires hospitalization. It can cause symptoms such as nausea, abdominal pain, and bloating. Ileus can last from a few hours to a few days. What are the causes? This condition may be caused by:  Surgery on the abdomen.  An infection or inflammation in the abdomen. This includes inflammation of the lining of the abdomen (peritonitis).  Infection or inflammation in other parts of the body, such as pneumonia or pancreatitis.  Passage of gallstones or kidney stones.  Damage to the nerves or blood vessels that go to the intestines.  A collection of blood within the abdominal cavity.  Imbalance in the salts in the blood (electrolytes).  Injury to the brain or spinal cord.  Medicines. Many medicines, including strong pain medicines, can cause ileus or make it worse. If the intestines stop working because of a blockage, that is a different condition that is called a bowel obstruction. What are the signs or symptoms? Symptoms of this condition  include:  Bloating of the abdomen.  Pain or discomfort in the abdomen.  Poor appetite.  Nausea and vomiting.  Lack of normal bowel sounds, such as "growling" in the stomach. How is this diagnosed? This condition may be diagnosed with:  A physical exam and medical history.  X-rays or a CT scan of the abdomen. You may also have other tests to help find the cause of the condition. How is this treated? This condition may be treated by:  Resting the intestines until they start to work again. This is often done by: ? Stopping oral intake of food and drink. You will be given fluid through an IV to prevent dehydration. ? Placing a small tube (nasogastric tube or NG tube) that is passed through your nose and into your stomach. The tube is attached to a suction device and keeps the stomach emptied out. This allows the bowels to rest and helps to reduce nausea and vomiting.  Correcting any electrolyte imbalance by giving supplements in the IV fluid.  Stopping any medicines that might make ileus worse.  Treating any condition that may have caused ileus. Follow these instructions at home: Eating and drinking   Follow instructions from your health care provider about: ? What to eat and drink. You may be told to start  eating a bland diet. Over time, you may slowly resume a more normal, healthy diet. ? How much to eat and drink. You should eat small meals often and stop eating when you feel full.  Avoid alcohol. General instructions  Take over-the-counter and prescription medicines only as told by your health care provider.  Rest as told by your health care provider.  Avoid sitting for a long time without moving. Get up to take short walks every 1-2 hours. Ask for help if you feel weak or unsteady.  Keep all follow-up visits as told by your health care provider. This is important. Contact a health care provider if:  You have nausea, vomiting, or abdominal discomfort.  You have a  fever. Get help right away if:  You have severe abdominal pain or bloating.  You cannot eat or drink without vomiting. Summary  Ileus is a condition that happens when the intestines, which are also called bowels, stop working correctly.  When ileus occurs, the muscular contractions that cause food to move through the intestines do not happen as they normally would.  Ileus can cause symptoms such as nausea, abdominal pain, and bloating.  Treatment may involve getting IV fluids and having a nasogastric tube placed to keep your stomach emptied out until the intestines start working again. This information is not intended to replace advice given to you by your health care provider. Make sure you discuss any questions you have with your health care provider. Document Revised: 11/05/2017 Document Reviewed: 11/05/2017 Elsevier Patient Education  [place].

## 2020-03-16 NOTE — Progress Notes (Signed)
4 Days Post-Op   Subjective/Chief Complaint: Pt with BMs, and tol fulls   Objective: Vital signs in last 24 hours: Temp:  [98.2 F (36.8 C)-98.3 F (36.8 C)] 98.3 F (36.8 C) (08/24 0556) Pulse Rate:  [80-92] 85 (08/24 0556) Resp:  [18] 18 (08/24 0556) BP: (136-146)/(80-84) 146/80 (08/24 0556) SpO2:  [98 %-100 %] 98 % (08/24 0556) Last BM Date: 03/16/20  Intake/Output from previous day: 08/23 0701 - 08/24 0700 In: 2762.1 [P.O.:840; I.V.:1422.1; IV Piggyback:500] Out: 453 [Urine:452; Stool:1] Intake/Output this shift: No intake/output data recorded.  PE:  Constitutional: No acute distress, conversant, appears states age. Eyes: Anicteric sclerae, moist conjunctiva, no lid lag Lungs: Clear to auscultation bilaterally, normal respiratory effort CV: regular rate and rhythm, no murmurs, no peripheral edema, pedal pulses 2+ GI: Soft, no masses or hepatosplenomegaly, non-tender to palpation Skin: No rashes, palpation reveals normal turgor Psychiatric: appropriate judgment and insight, oriented to person, place, and time'   Lab Results:  Recent Labs    03/15/20 0425  WBC 7.8  HGB 14.9  HCT 42.6  PLT 197   BMET Recent Labs    03/15/20 0425  NA 137  K 3.7  CL 108  CO2 19*  GLUCOSE 84  BUN 19  CREATININE 1.29*  CALCIUM 8.8*    Anti-infectives: Anti-infectives (From admission, onward)   Start     Dose/Rate Route Frequency Ordered Stop   03/15/20 1000  metroNIDAZOLE (FLAGYL) IVPB 500 mg        500 mg 100 mL/hr over 60 Minutes Intravenous Every 8 hours 03/15/20 0915     03/13/20 1500  cefTRIAXone (ROCEPHIN) 2 g in sodium chloride 0.9 % 100 mL IVPB       Note to Pharmacy: Pharmacy may adjust dosing strength / duration / interval for maximal efficacy   2 g 200 mL/hr over 30 Minutes Intravenous Every 24 hours 03/13/20 0912     03/13/20 1000  bictegravir-emtricitabine-tenofovir AF ([name]) 50-200-25 MG per tablet 1 tablet        1 tablet Oral Daily 03/12/20 2237      03/13/20 1000  metroNIDAZOLE (FLAGYL) IVPB 500 mg  Status:  Discontinued        500 mg 100 mL/hr over 60 Minutes Intravenous Every 6 hours 03/13/20 0912 03/15/20 0915   03/12/20 2245  metroNIDAZOLE (FLAGYL) IVPB 500 mg        500 mg 100 mL/hr over 60 Minutes Intravenous Every 8 hours 03/12/20 2237 03/13/20 0023   03/12/20 1530  cefTRIAXone (ROCEPHIN) 1 g in sodium chloride 0.9 % 100 mL IVPB        1 g 200 mL/hr over 30 Minutes Intravenous  Once 03/12/20 1521 03/12/20 1628   03/12/20 1530  metroNIDAZOLE (FLAGYL) IVPB 500 mg        500 mg 100 mL/hr over 60 Minutes Intravenous  Once 03/12/20 1521 03/12/20 1759   03/12/20 1515  piperacillin-tazobactam (ZOSYN) IVPB 3.375 g  Status:  Discontinued        3.375 g 100 mL/hr over 30 Minutes Intravenous  Once 03/12/20 1514 03/12/20 1519      Assessment/Plan: s/p Procedure(s): APPENDECTOMY LAPAROSCOPIC (N/A) Advance diet to soft and ADAT If tol PO OK for DC later today   LOS: 2 days    Ralene Ok 03/16/2020

## 2020-03-16 NOTE — Discharge Summary (Signed)
[facility] Discharge Summary   Patient ID: Lee [name] MRN: [id number] DOB/AGE: 1981-03-02 39 y.o.  Admit date: 03/12/2020 Discharge date: 03/16/2020  Admitting Diagnosis: Acute appendicitis  Discharge Diagnosis Perforated appendicitis  Consultants None   Imaging: No results found.  Procedures Dr. Marcello Moores (03/12/20) - Laparoscopic Appendectomy  Hospital Course:  Patient is a 38 year old male who presented to [facility] with abdominal pain.  Workup showed acute appendicitis. Patient was admitted and underwent procedure listed above. Tolerated procedure well and was transferred to the floor. Patient developed mild ileus but improved with mobilization and bowel rest. Diet was advanced as tolerated. On POD4, the patient was voiding well, tolerating diet, ambulating well, pain well controlled, vital signs stable, incisions c/d/i and felt stable for discharge home.  Patient will follow up in our office in 2-3 weeks and knows to call with questions or concerns.  He will call to confirm appointment date/time.    I or a member of my team have reviewed this patient in the Controlled Substance Database.   Allergies as of 03/16/2020      Reactions   Lisinopril Other (See Comments)   Dry cough    Nickel Rash      Medication List    TAKE these medications   acetaminophen 500 MG tablet Commonly known as: TYLENOL Take 2 tablets (1,000 mg total) by mouth every 6 (six) hours as needed for mild pain or fever.   albuterol 108 (90 Base) MCG/ACT inhaler Commonly known as: VENTOLIN HFA Inhale 2 puffs into the lungs every 6 (six) hours as needed for wheezing or shortness of breath.   amLODipine 10 MG tablet Commonly known as: NORVASC Take 1 tablet (10 mg total) by mouth daily.   Auvi-Q 0.3 mg/0.3 mL Soaj injection Generic drug: EPINEPHrine Inject 0.3 mg into the muscle once.   azelastine 0.1 % nasal spray Commonly known as: ASTELIN 1-2 puffs in each nostril twice daily    [name] 50-200-25 MG Tabs tablet Generic drug: bictegravir-emtricitabine-tenofovir AF Take 1 tablet by mouth daily.   ciprofloxacin 500 MG tablet Commonly known as: Cipro Take 1 tablet (500 mg total) by mouth 2 (two) times daily.   fluticasone 50 MCG/ACT nasal spray Commonly known as: FLONASE 1-2 sprays in each nare once daily   guaiFENesin 600 MG 12 hr tablet Commonly known as: MUCINEX Take 1 tablet (600 mg total) by mouth 2 (two) times daily as needed.   HYDROcodone-acetaminophen 5-325 MG tablet Commonly known as: NORCO/VICODIN Take 1-2 tablets by mouth every 6 (six) hours as needed for moderate pain.   metroNIDAZOLE 500 MG tablet Commonly known as: Flagyl Take 1 tablet (500 mg total) by mouth 2 (two) times daily with a meal for 5 days. DO NOT CONSUME ALCOHOL WHILE TAKING THIS MEDICATION.   Ozempic (1 MG/DOSE) 4 MG/3ML Sopn Generic drug: Semaglutide (1 MG/DOSE) Inject 1 mg into the skin once a week.   phentermine 37.5 MG tablet Commonly known as: ADIPEX-P Take 37.5-56.25 mg by mouth daily.   sodium chloride 0.65 % Soln nasal spray Commonly known as: OCEAN Place 1 spray into both nostrils as needed for congestion.   topiramate 100 MG tablet Commonly known as: TOPAMAX Take 100 mg by mouth at bedtime.   Vitamin D (Ergocalciferol) 1.25 MG (50000 UNIT) Caps capsule Commonly known as: DRISDOL Take 50,000 Units by mouth once a week.         Follow-up Information    [facility]. Go on 04/06/2020.  Specialty: General Surgery Why: Follow up scheduled for 9:00 AM. Please arrive 30 min prior to appointment time. Bring photo ID and insurance information.  Contact information: [address] [place] [address] [phone]               Signed: Norm Parcel , [facility] 03/16/2020, 3:11 PM Please see Amion for pager number during day hours 7:00am-4:30pm

## 2020-03-16 NOTE — Anesthesia Postprocedure Evaluation (Signed)
Anesthesia Post Note  Patient: Lee [name]  Procedure(s) Performed: APPENDECTOMY LAPAROSCOPIC (N/A )     Patient location during evaluation: PACU Anesthesia Type: General Level of consciousness: awake and alert Pain management: pain level controlled Vital Signs Assessment: post-procedure vital signs reviewed and stable Respiratory status: spontaneous breathing, nonlabored ventilation, respiratory function stable and patient connected to nasal cannula oxygen Cardiovascular status: blood pressure returned to baseline and stable Postop Assessment: no apparent nausea or vomiting Anesthetic complications: no   No complications documented.  Last Vitals:  Vitals:   03/15/20 2045 03/16/20 0556  BP: 136/83 (!) 146/80  Pulse: 92 85  Resp: 18 18  Temp: 36.8 C 36.8 C  SpO2: 99% 98%    Last Pain:  Vitals:   03/16/20 0800  TempSrc:   PainSc: 0-No pain                 Morna Flud COKER

## 2020-03-16 NOTE — Plan of Care (Signed)
Patient was given discharge instructions and his questions were answered. Patient was taken to main entrance via wheelchair.

## 2020-04-06 ENCOUNTER — Other Ambulatory Visit: Payer: Self-pay

## 2020-04-06 ENCOUNTER — Encounter (HOSPITAL_COMMUNITY): Payer: Self-pay

## 2020-04-06 ENCOUNTER — Other Ambulatory Visit: Payer: Self-pay | Admitting: Student

## 2020-04-06 ENCOUNTER — Ambulatory Visit (HOSPITAL_COMMUNITY)
Admission: RE | Admit: 2020-04-06 | Discharge: 2020-04-06 | Disposition: A | Discharge status: Home / Self Care | Payer: BC Managed Care – PPO | Source: Ambulatory Visit | Attending: Student | Admitting: Student

## 2020-04-06 ENCOUNTER — Other Ambulatory Visit (HOSPITAL_COMMUNITY): Payer: Self-pay | Admitting: Student

## 2020-04-06 DIAGNOSIS — R109 Unspecified abdominal pain: Secondary | ICD-10-CM | POA: Insufficient documentation

## 2020-04-06 MED ORDER — IOHEXOL 9 MG/ML PO SOLN
500.0000 mL | ORAL | Status: AC
  Administered 2020-04-06 (×2): 500 mL via ORAL

## 2020-04-06 MED ORDER — IOHEXOL 9 MG/ML PO SOLN
ORAL | Status: AC
  Filled 2020-04-06: qty 1000

## 2020-04-06 MED ORDER — IOHEXOL 300 MG/ML  SOLN
100.0000 mL | Freq: Once | INTRAMUSCULAR | Status: AC | PRN
  Administered 2020-04-06: 100 mL via INTRAVENOUS

## 2020-05-26 DIAGNOSIS — J3089 Other allergic rhinitis: Secondary | ICD-10-CM | POA: Diagnosis not present

## 2020-05-26 DIAGNOSIS — J301 Allergic rhinitis due to pollen: Secondary | ICD-10-CM | POA: Diagnosis not present

## 2020-05-26 DIAGNOSIS — J3081 Allergic rhinitis due to animal (cat) (dog) hair and dander: Secondary | ICD-10-CM | POA: Diagnosis not present

## 2020-06-05 ENCOUNTER — Ambulatory Visit: Payer: BC Managed Care – PPO

## 2020-06-07 DIAGNOSIS — J029 Acute pharyngitis, unspecified: Secondary | ICD-10-CM | POA: Diagnosis not present

## 2020-06-07 DIAGNOSIS — J209 Acute bronchitis, unspecified: Secondary | ICD-10-CM | POA: Diagnosis not present

## 2020-06-07 DIAGNOSIS — J019 Acute sinusitis, unspecified: Secondary | ICD-10-CM | POA: Diagnosis not present

## 2020-06-07 DIAGNOSIS — J040 Acute laryngitis: Secondary | ICD-10-CM | POA: Diagnosis not present

## 2020-06-09 DIAGNOSIS — J3081 Allergic rhinitis due to animal (cat) (dog) hair and dander: Secondary | ICD-10-CM | POA: Diagnosis not present

## 2020-06-09 DIAGNOSIS — J3089 Other allergic rhinitis: Secondary | ICD-10-CM | POA: Diagnosis not present

## 2020-06-09 DIAGNOSIS — J301 Allergic rhinitis due to pollen: Secondary | ICD-10-CM | POA: Diagnosis not present

## 2020-06-16 DIAGNOSIS — J3089 Other allergic rhinitis: Secondary | ICD-10-CM | POA: Diagnosis not present

## 2020-06-16 DIAGNOSIS — I10 Essential (primary) hypertension: Secondary | ICD-10-CM | POA: Diagnosis not present

## 2020-06-16 DIAGNOSIS — J3081 Allergic rhinitis due to animal (cat) (dog) hair and dander: Secondary | ICD-10-CM | POA: Diagnosis not present

## 2020-06-16 DIAGNOSIS — E669 Obesity, unspecified: Secondary | ICD-10-CM | POA: Diagnosis not present

## 2020-06-16 DIAGNOSIS — J301 Allergic rhinitis due to pollen: Secondary | ICD-10-CM | POA: Diagnosis not present

## 2020-06-16 DIAGNOSIS — E7800 Pure hypercholesterolemia, unspecified: Secondary | ICD-10-CM | POA: Diagnosis not present

## 2020-06-16 DIAGNOSIS — R635 Abnormal weight gain: Secondary | ICD-10-CM | POA: Diagnosis not present

## 2020-06-16 DIAGNOSIS — N1831 Chronic kidney disease, stage 3a: Secondary | ICD-10-CM | POA: Diagnosis not present

## 2020-06-23 DIAGNOSIS — J3081 Allergic rhinitis due to animal (cat) (dog) hair and dander: Secondary | ICD-10-CM | POA: Diagnosis not present

## 2020-06-23 DIAGNOSIS — J301 Allergic rhinitis due to pollen: Secondary | ICD-10-CM | POA: Diagnosis not present

## 2020-06-23 DIAGNOSIS — J3089 Other allergic rhinitis: Secondary | ICD-10-CM | POA: Diagnosis not present

## 2020-06-30 DIAGNOSIS — J301 Allergic rhinitis due to pollen: Secondary | ICD-10-CM | POA: Diagnosis not present

## 2020-06-30 DIAGNOSIS — J3089 Other allergic rhinitis: Secondary | ICD-10-CM | POA: Diagnosis not present

## 2020-06-30 DIAGNOSIS — J3081 Allergic rhinitis due to animal (cat) (dog) hair and dander: Secondary | ICD-10-CM | POA: Diagnosis not present

## 2020-07-07 ENCOUNTER — Other Ambulatory Visit: Payer: Self-pay | Admitting: Infectious Diseases

## 2020-07-07 DIAGNOSIS — B20 Human immunodeficiency virus [HIV] disease: Secondary | ICD-10-CM

## 2020-07-07 DIAGNOSIS — J301 Allergic rhinitis due to pollen: Secondary | ICD-10-CM | POA: Diagnosis not present

## 2020-07-07 DIAGNOSIS — J3081 Allergic rhinitis due to animal (cat) (dog) hair and dander: Secondary | ICD-10-CM | POA: Diagnosis not present

## 2020-07-07 DIAGNOSIS — J3089 Other allergic rhinitis: Secondary | ICD-10-CM | POA: Diagnosis not present

## 2020-07-21 DIAGNOSIS — J301 Allergic rhinitis due to pollen: Secondary | ICD-10-CM | POA: Diagnosis not present

## 2020-07-21 DIAGNOSIS — J3081 Allergic rhinitis due to animal (cat) (dog) hair and dander: Secondary | ICD-10-CM | POA: Diagnosis not present

## 2020-07-21 DIAGNOSIS — J3089 Other allergic rhinitis: Secondary | ICD-10-CM | POA: Diagnosis not present

## 2020-07-28 DIAGNOSIS — J3089 Other allergic rhinitis: Secondary | ICD-10-CM | POA: Diagnosis not present

## 2020-07-28 DIAGNOSIS — J301 Allergic rhinitis due to pollen: Secondary | ICD-10-CM | POA: Diagnosis not present

## 2020-07-28 DIAGNOSIS — J3081 Allergic rhinitis due to animal (cat) (dog) hair and dander: Secondary | ICD-10-CM | POA: Diagnosis not present

## 2020-08-04 DIAGNOSIS — R635 Abnormal weight gain: Secondary | ICD-10-CM | POA: Diagnosis not present

## 2020-08-04 DIAGNOSIS — E559 Vitamin D deficiency, unspecified: Secondary | ICD-10-CM | POA: Diagnosis not present

## 2020-08-04 DIAGNOSIS — J3089 Other allergic rhinitis: Secondary | ICD-10-CM | POA: Diagnosis not present

## 2020-08-04 DIAGNOSIS — I10 Essential (primary) hypertension: Secondary | ICD-10-CM | POA: Diagnosis not present

## 2020-08-04 DIAGNOSIS — J301 Allergic rhinitis due to pollen: Secondary | ICD-10-CM | POA: Diagnosis not present

## 2020-08-04 DIAGNOSIS — Z79899 Other long term (current) drug therapy: Secondary | ICD-10-CM | POA: Diagnosis not present

## 2020-08-04 DIAGNOSIS — R5383 Other fatigue: Secondary | ICD-10-CM | POA: Diagnosis not present

## 2020-08-04 DIAGNOSIS — D539 Nutritional anemia, unspecified: Secondary | ICD-10-CM | POA: Diagnosis not present

## 2020-08-04 DIAGNOSIS — E7800 Pure hypercholesterolemia, unspecified: Secondary | ICD-10-CM | POA: Diagnosis not present

## 2020-08-04 DIAGNOSIS — Z131 Encounter for screening for diabetes mellitus: Secondary | ICD-10-CM | POA: Diagnosis not present

## 2020-08-04 DIAGNOSIS — E669 Obesity, unspecified: Secondary | ICD-10-CM | POA: Diagnosis not present

## 2020-08-04 DIAGNOSIS — Z20822 Contact with and (suspected) exposure to covid-19: Secondary | ICD-10-CM | POA: Diagnosis not present

## 2020-08-04 DIAGNOSIS — J3081 Allergic rhinitis due to animal (cat) (dog) hair and dander: Secondary | ICD-10-CM | POA: Diagnosis not present

## 2020-08-04 DIAGNOSIS — N1831 Chronic kidney disease, stage 3a: Secondary | ICD-10-CM | POA: Diagnosis not present

## 2020-08-11 DIAGNOSIS — J3089 Other allergic rhinitis: Secondary | ICD-10-CM | POA: Diagnosis not present

## 2020-08-11 DIAGNOSIS — J3081 Allergic rhinitis due to animal (cat) (dog) hair and dander: Secondary | ICD-10-CM | POA: Diagnosis not present

## 2020-08-11 DIAGNOSIS — J301 Allergic rhinitis due to pollen: Secondary | ICD-10-CM | POA: Diagnosis not present

## 2020-08-16 DIAGNOSIS — I129 Hypertensive chronic kidney disease with stage 1 through stage 4 chronic kidney disease, or unspecified chronic kidney disease: Secondary | ICD-10-CM | POA: Diagnosis not present

## 2020-08-16 DIAGNOSIS — N189 Chronic kidney disease, unspecified: Secondary | ICD-10-CM | POA: Diagnosis not present

## 2020-08-16 DIAGNOSIS — B20 Human immunodeficiency virus [HIV] disease: Secondary | ICD-10-CM | POA: Diagnosis not present

## 2020-08-16 DIAGNOSIS — N2581 Secondary hyperparathyroidism of renal origin: Secondary | ICD-10-CM | POA: Diagnosis not present

## 2020-08-16 DIAGNOSIS — N183 Chronic kidney disease, stage 3 unspecified: Secondary | ICD-10-CM | POA: Diagnosis not present

## 2020-08-20 DIAGNOSIS — U071 COVID-19: Secondary | ICD-10-CM | POA: Diagnosis not present

## 2020-08-25 DIAGNOSIS — J301 Allergic rhinitis due to pollen: Secondary | ICD-10-CM | POA: Diagnosis not present

## 2020-08-25 DIAGNOSIS — J3089 Other allergic rhinitis: Secondary | ICD-10-CM | POA: Diagnosis not present

## 2020-08-25 DIAGNOSIS — H1045 Other chronic allergic conjunctivitis: Secondary | ICD-10-CM | POA: Diagnosis not present

## 2020-08-25 DIAGNOSIS — J3081 Allergic rhinitis due to animal (cat) (dog) hair and dander: Secondary | ICD-10-CM | POA: Diagnosis not present

## 2020-08-27 ENCOUNTER — Other Ambulatory Visit: Payer: BC Managed Care – PPO

## 2020-08-27 ENCOUNTER — Other Ambulatory Visit (HOSPITAL_COMMUNITY)
Admission: RE | Admit: 2020-08-27 | Discharge: 2020-08-27 | Disposition: A | Discharge status: Home / Self Care | Payer: BC Managed Care – PPO | Source: Ambulatory Visit | Attending: Infectious Diseases | Admitting: Infectious Diseases

## 2020-08-27 ENCOUNTER — Other Ambulatory Visit: Payer: Self-pay | Admitting: Infectious Diseases

## 2020-08-27 ENCOUNTER — Other Ambulatory Visit: Payer: Self-pay

## 2020-08-27 DIAGNOSIS — Z113 Encounter for screening for infections with a predominantly sexual mode of transmission: Secondary | ICD-10-CM

## 2020-08-27 DIAGNOSIS — B20 Human immunodeficiency virus [HIV] disease: Secondary | ICD-10-CM

## 2020-08-27 DIAGNOSIS — Z79899 Other long term (current) drug therapy: Secondary | ICD-10-CM

## 2020-08-30 LAB — URINE CYTOLOGY ANCILLARY ONLY
Chlamydia: NEGATIVE
Comment: NEGATIVE
Comment: NORMAL
Neisseria Gonorrhea: NEGATIVE

## 2020-08-30 LAB — T-HELPER CELLS (CD4) COUNT (NOT AT ARMC)
Absolute CD4: 519 cells/uL (ref 490–1740)
CD4 T Helper %: 42 % (ref 30–61)
Total lymphocyte count: 1228 cells/uL (ref 850–3900)

## 2020-08-30 LAB — LIPID PANEL
Cholesterol: 185 mg/dL (ref <200)
HDL: 35 mg/dL — ABNORMAL LOW (ref >=40)
LDL Cholesterol (Calc): 134 mg/dL (calc) — ABNORMAL HIGH
Non-HDL Cholesterol (Calc): 150 mg/dL (calc) — ABNORMAL HIGH (ref <130)
Total CHOL/HDL Ratio: 5.3 (calc) — ABNORMAL HIGH (ref <5.0)
Triglycerides: 66 mg/dL (ref <150)

## 2020-09-01 DIAGNOSIS — J301 Allergic rhinitis due to pollen: Secondary | ICD-10-CM | POA: Diagnosis not present

## 2020-09-01 DIAGNOSIS — J3081 Allergic rhinitis due to animal (cat) (dog) hair and dander: Secondary | ICD-10-CM | POA: Diagnosis not present

## 2020-09-01 DIAGNOSIS — J3089 Other allergic rhinitis: Secondary | ICD-10-CM | POA: Diagnosis not present

## 2020-09-02 DIAGNOSIS — H906 Mixed conductive and sensorineural hearing loss, bilateral: Secondary | ICD-10-CM | POA: Diagnosis not present

## 2020-09-02 DIAGNOSIS — H7293 Unspecified perforation of tympanic membrane, bilateral: Secondary | ICD-10-CM | POA: Diagnosis not present

## 2020-09-03 DIAGNOSIS — N1831 Chronic kidney disease, stage 3a: Secondary | ICD-10-CM | POA: Diagnosis not present

## 2020-09-03 DIAGNOSIS — R635 Abnormal weight gain: Secondary | ICD-10-CM | POA: Diagnosis not present

## 2020-09-03 DIAGNOSIS — E669 Obesity, unspecified: Secondary | ICD-10-CM | POA: Diagnosis not present

## 2020-09-03 DIAGNOSIS — I10 Essential (primary) hypertension: Secondary | ICD-10-CM | POA: Diagnosis not present

## 2020-09-03 DIAGNOSIS — E7800 Pure hypercholesterolemia, unspecified: Secondary | ICD-10-CM | POA: Diagnosis not present

## 2020-09-09 ENCOUNTER — Other Ambulatory Visit: Payer: Self-pay

## 2020-09-09 ENCOUNTER — Encounter: Payer: Self-pay | Admitting: Infectious Diseases

## 2020-09-09 ENCOUNTER — Encounter: Payer: BC Managed Care – PPO | Admitting: Infectious Diseases

## 2020-09-09 ENCOUNTER — Ambulatory Visit (INDEPENDENT_AMBULATORY_CARE_PROVIDER_SITE_OTHER): Payer: BC Managed Care – PPO | Admitting: Infectious Diseases

## 2020-09-09 VITALS — BP 110/72 | HR 93 | Temp 98.5°F | Ht 76.0 in | Wt 239.0 lb

## 2020-09-09 DIAGNOSIS — H9190 Unspecified hearing loss, unspecified ear: Secondary | ICD-10-CM | POA: Insufficient documentation

## 2020-09-09 DIAGNOSIS — H9193 Unspecified hearing loss, bilateral: Secondary | ICD-10-CM

## 2020-09-09 DIAGNOSIS — Z113 Encounter for screening for infections with a predominantly sexual mode of transmission: Secondary | ICD-10-CM | POA: Diagnosis not present

## 2020-09-09 DIAGNOSIS — B20 Human immunodeficiency virus [HIV] disease: Secondary | ICD-10-CM

## 2020-09-09 DIAGNOSIS — I10 Essential (primary) hypertension: Secondary | ICD-10-CM | POA: Diagnosis not present

## 2020-09-09 DIAGNOSIS — Z79899 Other long term (current) drug therapy: Secondary | ICD-10-CM

## 2020-09-09 DIAGNOSIS — N1831 Chronic kidney disease, stage 3a: Secondary | ICD-10-CM | POA: Diagnosis not present

## 2020-09-09 DIAGNOSIS — Z21 Asymptomatic human immunodeficiency virus [HIV] infection status: Secondary | ICD-10-CM

## 2020-09-09 NOTE — Assessment & Plan Note (Addendum)
He is doing well Will continue biktarvy Given condoms PCV23 2023 rtc in 9 months with labs then

## 2020-09-09 NOTE — Assessment & Plan Note (Signed)
Well controlled on amolidpine.  Will continue

## 2020-09-09 NOTE — Assessment & Plan Note (Signed)
Working with ENT to get hearing aids (vs more surgery)

## 2020-09-09 NOTE — Progress Notes (Signed)
   Subjective:    Patient ID: Lee [name], male  DOB: 29-Aug-1980, 40 y.o.        MRN: [id number]   HPI 40yo M with hx of HTN and HIV+ dx 08-03-16.  He had first visit 2-15 and was also found to have syphilis. He received IM PEN G on 09-14-16.  His genotype is naive. He is HLA negative.  Has been on triumeq but at 5-31 he had increased LFTs and Cr on labs. He had repeat labs done on 6-8 which confirmed this. He was seen in f/u with pharm 02-2017 and was changed to biktarvy.  Working at [employer] as a Scientist, forensic. Looking for other work in/out of current work.  Looking forward to traveling soon.  No problems with [name].  Uses reminder to get refill.   HIV 1 RNA Quant (copies/mL)  Date Value  12/05/2019 35 (H)  02/20/2019 <20 DETECTED (A)  02/21/2018 28 (H)   CD4 T Cell Abs (/uL)  Date Value  12/05/2019 538  02/20/2019 524  02/21/2018 600     Health Maintenance  Topic Date Due  . COVID-19 Vaccine (3 - Pfizer risk 4-dose series) 12/06/2019  . INFLUENZA VACCINE  02/22/2020  . TETANUS/TDAP  02/19/2029  . Hepatitis C Screening  Completed  . HIV Screening  Completed      Review of Systems  Constitutional: Negative for chills, fever and weight loss (lost 20# when appendix burst, has regained).  Respiratory: Negative for cough and shortness of breath.   Gastrointestinal: Negative for abdominal pain, constipation and diarrhea.  Genitourinary: Negative for dysuria.  Psychiatric/Behavioral: The patient does not have insomnia.    Please see HPI. All other systems reviewed and negative.     Objective:  Physical Exam Vitals reviewed.  Constitutional:      Appearance: Normal appearance.  HENT:     Mouth/Throat:     Mouth: Mucous membranes are moist.     Pharynx: No oropharyngeal exudate.  Eyes:     Extraocular Movements: Extraocular movements intact.     Pupils: Pupils are equal, round, and reactive to light.  Cardiovascular:     Rate and Rhythm: Normal rate  and regular rhythm.  Pulmonary:     Effort: Pulmonary effort is normal.     Breath sounds: Normal breath sounds.  Abdominal:     General: Bowel sounds are normal. There is no distension.     Palpations: Abdomen is soft.     Tenderness: There is no abdominal tenderness.  Musculoskeletal:        General: Normal range of motion.     Cervical back: Normal range of motion and neck supple.     Right lower leg: No edema.     Left lower leg: No edema.  Neurological:     General: No focal deficit present.     Mental Status: He is alert.            Assessment & Plan:

## 2020-09-09 NOTE — Assessment & Plan Note (Signed)
Had f/u renal this month.  Doing well.

## 2020-09-10 ENCOUNTER — Encounter: Payer: BC Managed Care – PPO | Admitting: Infectious Diseases

## 2020-09-15 DIAGNOSIS — J3081 Allergic rhinitis due to animal (cat) (dog) hair and dander: Secondary | ICD-10-CM | POA: Diagnosis not present

## 2020-09-15 DIAGNOSIS — J301 Allergic rhinitis due to pollen: Secondary | ICD-10-CM | POA: Diagnosis not present

## 2020-09-15 DIAGNOSIS — J3089 Other allergic rhinitis: Secondary | ICD-10-CM | POA: Diagnosis not present

## 2020-09-16 ENCOUNTER — Other Ambulatory Visit: Payer: Self-pay

## 2020-09-16 ENCOUNTER — Other Ambulatory Visit: Payer: Self-pay | Admitting: Infectious Diseases

## 2020-09-16 DIAGNOSIS — I10 Essential (primary) hypertension: Secondary | ICD-10-CM

## 2020-09-16 DIAGNOSIS — B20 Human immunodeficiency virus [HIV] disease: Secondary | ICD-10-CM

## 2020-09-16 MED ORDER — LOSARTAN POTASSIUM 100 MG PO TABS: 100.0000 mg | ORAL_TABLET | Freq: Every day | ORAL | 0 refills | Status: DC

## 2020-09-16 MED ORDER — BIKTARVY 50-200-25 MG PO TABS: 1.0000 | ORAL_TABLET | Freq: Every day | ORAL | 4 refills | Status: DC

## 2020-09-16 NOTE — Telephone Encounter (Signed)
Please continue losartan, stop lisinopril thanks

## 2020-09-17 ENCOUNTER — Other Ambulatory Visit: Payer: Self-pay

## 2020-09-17 DIAGNOSIS — I10 Essential (primary) hypertension: Secondary | ICD-10-CM

## 2020-09-17 MED ORDER — LOSARTAN POTASSIUM 100 MG PO TABS: 100.0000 mg | ORAL_TABLET | Freq: Every day | ORAL | 0 refills | Status: DC

## 2020-09-22 DIAGNOSIS — J301 Allergic rhinitis due to pollen: Secondary | ICD-10-CM | POA: Diagnosis not present

## 2020-09-22 DIAGNOSIS — J3089 Other allergic rhinitis: Secondary | ICD-10-CM | POA: Diagnosis not present

## 2020-09-22 DIAGNOSIS — J3081 Allergic rhinitis due to animal (cat) (dog) hair and dander: Secondary | ICD-10-CM | POA: Diagnosis not present

## 2020-09-27 ENCOUNTER — Telehealth: Payer: Self-pay

## 2020-09-27 NOTE — Telephone Encounter (Signed)
[facility] Patient Advocate Encounter   Was successful in obtaining a [employer] copay card for [name].  This copay card will make the patients copay $0.00.  I have spoken with the patient.         Yaileen Hofferber, CPhT [facility] Phone: [phone] Fax:  [phone]  

## 2020-09-29 DIAGNOSIS — J3089 Other allergic rhinitis: Secondary | ICD-10-CM | POA: Diagnosis not present

## 2020-09-29 DIAGNOSIS — J3081 Allergic rhinitis due to animal (cat) (dog) hair and dander: Secondary | ICD-10-CM | POA: Diagnosis not present

## 2020-09-29 DIAGNOSIS — J301 Allergic rhinitis due to pollen: Secondary | ICD-10-CM | POA: Diagnosis not present

## 2020-10-13 DIAGNOSIS — J3089 Other allergic rhinitis: Secondary | ICD-10-CM | POA: Diagnosis not present

## 2020-10-13 DIAGNOSIS — J3081 Allergic rhinitis due to animal (cat) (dog) hair and dander: Secondary | ICD-10-CM | POA: Diagnosis not present

## 2020-10-13 DIAGNOSIS — J301 Allergic rhinitis due to pollen: Secondary | ICD-10-CM | POA: Diagnosis not present

## 2020-10-19 DIAGNOSIS — N1831 Chronic kidney disease, stage 3a: Secondary | ICD-10-CM | POA: Diagnosis not present

## 2020-10-19 DIAGNOSIS — E669 Obesity, unspecified: Secondary | ICD-10-CM | POA: Diagnosis not present

## 2020-10-19 DIAGNOSIS — R635 Abnormal weight gain: Secondary | ICD-10-CM | POA: Diagnosis not present

## 2020-10-19 DIAGNOSIS — I10 Essential (primary) hypertension: Secondary | ICD-10-CM | POA: Diagnosis not present

## 2020-10-19 DIAGNOSIS — E7800 Pure hypercholesterolemia, unspecified: Secondary | ICD-10-CM | POA: Diagnosis not present

## 2020-10-20 DIAGNOSIS — J3089 Other allergic rhinitis: Secondary | ICD-10-CM | POA: Diagnosis not present

## 2020-10-20 DIAGNOSIS — J301 Allergic rhinitis due to pollen: Secondary | ICD-10-CM | POA: Diagnosis not present

## 2020-10-20 DIAGNOSIS — J3081 Allergic rhinitis due to animal (cat) (dog) hair and dander: Secondary | ICD-10-CM | POA: Diagnosis not present

## 2020-10-27 DIAGNOSIS — J3089 Other allergic rhinitis: Secondary | ICD-10-CM | POA: Diagnosis not present

## 2020-10-27 DIAGNOSIS — J3081 Allergic rhinitis due to animal (cat) (dog) hair and dander: Secondary | ICD-10-CM | POA: Diagnosis not present

## 2020-10-27 DIAGNOSIS — J301 Allergic rhinitis due to pollen: Secondary | ICD-10-CM | POA: Diagnosis not present

## 2020-10-31 DIAGNOSIS — J3081 Allergic rhinitis due to animal (cat) (dog) hair and dander: Secondary | ICD-10-CM | POA: Diagnosis not present

## 2020-10-31 DIAGNOSIS — J3089 Other allergic rhinitis: Secondary | ICD-10-CM | POA: Diagnosis not present

## 2020-10-31 DIAGNOSIS — J301 Allergic rhinitis due to pollen: Secondary | ICD-10-CM | POA: Diagnosis not present

## 2020-11-01 DIAGNOSIS — J3081 Allergic rhinitis due to animal (cat) (dog) hair and dander: Secondary | ICD-10-CM | POA: Diagnosis not present

## 2020-11-01 DIAGNOSIS — J3089 Other allergic rhinitis: Secondary | ICD-10-CM | POA: Diagnosis not present

## 2020-11-01 DIAGNOSIS — J301 Allergic rhinitis due to pollen: Secondary | ICD-10-CM | POA: Diagnosis not present

## 2020-11-03 DIAGNOSIS — J301 Allergic rhinitis due to pollen: Secondary | ICD-10-CM | POA: Diagnosis not present

## 2020-11-03 DIAGNOSIS — J3081 Allergic rhinitis due to animal (cat) (dog) hair and dander: Secondary | ICD-10-CM | POA: Diagnosis not present

## 2020-11-03 DIAGNOSIS — J3089 Other allergic rhinitis: Secondary | ICD-10-CM | POA: Diagnosis not present

## 2020-11-10 DIAGNOSIS — J3081 Allergic rhinitis due to animal (cat) (dog) hair and dander: Secondary | ICD-10-CM | POA: Diagnosis not present

## 2020-11-10 DIAGNOSIS — J3089 Other allergic rhinitis: Secondary | ICD-10-CM | POA: Diagnosis not present

## 2020-11-10 DIAGNOSIS — J301 Allergic rhinitis due to pollen: Secondary | ICD-10-CM | POA: Diagnosis not present

## 2020-11-17 DIAGNOSIS — R635 Abnormal weight gain: Secondary | ICD-10-CM | POA: Diagnosis not present

## 2020-11-17 DIAGNOSIS — J3089 Other allergic rhinitis: Secondary | ICD-10-CM | POA: Diagnosis not present

## 2020-11-17 DIAGNOSIS — E669 Obesity, unspecified: Secondary | ICD-10-CM | POA: Diagnosis not present

## 2020-11-17 DIAGNOSIS — J301 Allergic rhinitis due to pollen: Secondary | ICD-10-CM | POA: Diagnosis not present

## 2020-11-17 DIAGNOSIS — J3081 Allergic rhinitis due to animal (cat) (dog) hair and dander: Secondary | ICD-10-CM | POA: Diagnosis not present

## 2020-11-17 DIAGNOSIS — E7800 Pure hypercholesterolemia, unspecified: Secondary | ICD-10-CM | POA: Diagnosis not present

## 2020-11-17 DIAGNOSIS — N1831 Chronic kidney disease, stage 3a: Secondary | ICD-10-CM | POA: Diagnosis not present

## 2020-11-17 DIAGNOSIS — I10 Essential (primary) hypertension: Secondary | ICD-10-CM | POA: Diagnosis not present

## 2020-11-24 DIAGNOSIS — J301 Allergic rhinitis due to pollen: Secondary | ICD-10-CM | POA: Diagnosis not present

## 2020-11-24 DIAGNOSIS — J3089 Other allergic rhinitis: Secondary | ICD-10-CM | POA: Diagnosis not present

## 2020-11-24 DIAGNOSIS — J3081 Allergic rhinitis due to animal (cat) (dog) hair and dander: Secondary | ICD-10-CM | POA: Diagnosis not present

## 2020-12-01 DIAGNOSIS — J3081 Allergic rhinitis due to animal (cat) (dog) hair and dander: Secondary | ICD-10-CM | POA: Diagnosis not present

## 2020-12-01 DIAGNOSIS — J301 Allergic rhinitis due to pollen: Secondary | ICD-10-CM | POA: Diagnosis not present

## 2020-12-01 DIAGNOSIS — J3089 Other allergic rhinitis: Secondary | ICD-10-CM | POA: Diagnosis not present

## 2020-12-08 DIAGNOSIS — J3081 Allergic rhinitis due to animal (cat) (dog) hair and dander: Secondary | ICD-10-CM | POA: Diagnosis not present

## 2020-12-08 DIAGNOSIS — J3089 Other allergic rhinitis: Secondary | ICD-10-CM | POA: Diagnosis not present

## 2020-12-08 DIAGNOSIS — J301 Allergic rhinitis due to pollen: Secondary | ICD-10-CM | POA: Diagnosis not present

## 2020-12-15 DIAGNOSIS — J301 Allergic rhinitis due to pollen: Secondary | ICD-10-CM | POA: Diagnosis not present

## 2020-12-15 DIAGNOSIS — J3089 Other allergic rhinitis: Secondary | ICD-10-CM | POA: Diagnosis not present

## 2020-12-15 DIAGNOSIS — J3081 Allergic rhinitis due to animal (cat) (dog) hair and dander: Secondary | ICD-10-CM | POA: Diagnosis not present

## 2020-12-17 DIAGNOSIS — E669 Obesity, unspecified: Secondary | ICD-10-CM | POA: Diagnosis not present

## 2020-12-17 DIAGNOSIS — E7800 Pure hypercholesterolemia, unspecified: Secondary | ICD-10-CM | POA: Diagnosis not present

## 2020-12-17 DIAGNOSIS — N1831 Chronic kidney disease, stage 3a: Secondary | ICD-10-CM | POA: Diagnosis not present

## 2020-12-17 DIAGNOSIS — R635 Abnormal weight gain: Secondary | ICD-10-CM | POA: Diagnosis not present

## 2020-12-17 DIAGNOSIS — I10 Essential (primary) hypertension: Secondary | ICD-10-CM | POA: Diagnosis not present

## 2020-12-22 DIAGNOSIS — J301 Allergic rhinitis due to pollen: Secondary | ICD-10-CM | POA: Diagnosis not present

## 2020-12-22 DIAGNOSIS — J3081 Allergic rhinitis due to animal (cat) (dog) hair and dander: Secondary | ICD-10-CM | POA: Diagnosis not present

## 2020-12-22 DIAGNOSIS — J3089 Other allergic rhinitis: Secondary | ICD-10-CM | POA: Diagnosis not present

## 2020-12-28 ENCOUNTER — Other Ambulatory Visit: Payer: Self-pay

## 2020-12-28 DIAGNOSIS — I10 Essential (primary) hypertension: Secondary | ICD-10-CM

## 2020-12-28 MED ORDER — AMLODIPINE BESYLATE 10 MG PO TABS: 10.0000 mg | ORAL_TABLET | Freq: Every day | ORAL | 0 refills | Status: DC

## 2020-12-28 MED ORDER — LOSARTAN POTASSIUM 100 MG PO TABS: 100.0000 mg | ORAL_TABLET | Freq: Every day | ORAL | 0 refills | Status: DC

## 2020-12-29 DIAGNOSIS — J3081 Allergic rhinitis due to animal (cat) (dog) hair and dander: Secondary | ICD-10-CM | POA: Diagnosis not present

## 2020-12-29 DIAGNOSIS — J301 Allergic rhinitis due to pollen: Secondary | ICD-10-CM | POA: Diagnosis not present

## 2020-12-29 DIAGNOSIS — J3089 Other allergic rhinitis: Secondary | ICD-10-CM | POA: Diagnosis not present

## 2021-01-05 DIAGNOSIS — J3081 Allergic rhinitis due to animal (cat) (dog) hair and dander: Secondary | ICD-10-CM | POA: Diagnosis not present

## 2021-01-05 DIAGNOSIS — J301 Allergic rhinitis due to pollen: Secondary | ICD-10-CM | POA: Diagnosis not present

## 2021-01-05 DIAGNOSIS — J3089 Other allergic rhinitis: Secondary | ICD-10-CM | POA: Diagnosis not present

## 2021-01-11 ENCOUNTER — Other Ambulatory Visit: Payer: Self-pay

## 2021-01-11 DIAGNOSIS — I10 Essential (primary) hypertension: Secondary | ICD-10-CM

## 2021-01-12 DIAGNOSIS — J3089 Other allergic rhinitis: Secondary | ICD-10-CM | POA: Diagnosis not present

## 2021-01-12 DIAGNOSIS — J3081 Allergic rhinitis due to animal (cat) (dog) hair and dander: Secondary | ICD-10-CM | POA: Diagnosis not present

## 2021-01-12 DIAGNOSIS — J301 Allergic rhinitis due to pollen: Secondary | ICD-10-CM | POA: Diagnosis not present

## 2021-01-12 MED ORDER — LOSARTAN POTASSIUM 100 MG PO TABS: 100.0000 mg | ORAL_TABLET | Freq: Every day | ORAL | 0 refills | Status: DC

## 2021-01-12 MED ORDER — AMLODIPINE BESYLATE 10 MG PO TABS: 10.0000 mg | ORAL_TABLET | Freq: Every day | ORAL | 0 refills | Status: DC

## 2021-01-19 DIAGNOSIS — J3081 Allergic rhinitis due to animal (cat) (dog) hair and dander: Secondary | ICD-10-CM | POA: Diagnosis not present

## 2021-01-19 DIAGNOSIS — J301 Allergic rhinitis due to pollen: Secondary | ICD-10-CM | POA: Diagnosis not present

## 2021-01-19 DIAGNOSIS — J3089 Other allergic rhinitis: Secondary | ICD-10-CM | POA: Diagnosis not present

## 2021-01-26 DIAGNOSIS — R635 Abnormal weight gain: Secondary | ICD-10-CM | POA: Diagnosis not present

## 2021-01-26 DIAGNOSIS — E669 Obesity, unspecified: Secondary | ICD-10-CM | POA: Diagnosis not present

## 2021-01-26 DIAGNOSIS — J3089 Other allergic rhinitis: Secondary | ICD-10-CM | POA: Diagnosis not present

## 2021-01-26 DIAGNOSIS — J301 Allergic rhinitis due to pollen: Secondary | ICD-10-CM | POA: Diagnosis not present

## 2021-01-26 DIAGNOSIS — E7800 Pure hypercholesterolemia, unspecified: Secondary | ICD-10-CM | POA: Diagnosis not present

## 2021-01-26 DIAGNOSIS — N1831 Chronic kidney disease, stage 3a: Secondary | ICD-10-CM | POA: Diagnosis not present

## 2021-01-26 DIAGNOSIS — I10 Essential (primary) hypertension: Secondary | ICD-10-CM | POA: Diagnosis not present

## 2021-01-26 DIAGNOSIS — J3081 Allergic rhinitis due to animal (cat) (dog) hair and dander: Secondary | ICD-10-CM | POA: Diagnosis not present

## 2021-02-09 DIAGNOSIS — J301 Allergic rhinitis due to pollen: Secondary | ICD-10-CM | POA: Diagnosis not present

## 2021-02-09 DIAGNOSIS — J3089 Other allergic rhinitis: Secondary | ICD-10-CM | POA: Diagnosis not present

## 2021-02-09 DIAGNOSIS — J3081 Allergic rhinitis due to animal (cat) (dog) hair and dander: Secondary | ICD-10-CM | POA: Diagnosis not present

## 2021-02-23 DIAGNOSIS — J3081 Allergic rhinitis due to animal (cat) (dog) hair and dander: Secondary | ICD-10-CM | POA: Diagnosis not present

## 2021-02-23 DIAGNOSIS — J301 Allergic rhinitis due to pollen: Secondary | ICD-10-CM | POA: Diagnosis not present

## 2021-02-23 DIAGNOSIS — J3089 Other allergic rhinitis: Secondary | ICD-10-CM | POA: Diagnosis not present

## 2021-02-25 DIAGNOSIS — R635 Abnormal weight gain: Secondary | ICD-10-CM | POA: Diagnosis not present

## 2021-02-25 DIAGNOSIS — N1831 Chronic kidney disease, stage 3a: Secondary | ICD-10-CM | POA: Diagnosis not present

## 2021-02-25 DIAGNOSIS — I10 Essential (primary) hypertension: Secondary | ICD-10-CM | POA: Diagnosis not present

## 2021-02-25 DIAGNOSIS — E669 Obesity, unspecified: Secondary | ICD-10-CM | POA: Diagnosis not present

## 2021-02-25 DIAGNOSIS — E7800 Pure hypercholesterolemia, unspecified: Secondary | ICD-10-CM | POA: Diagnosis not present

## 2021-03-02 DIAGNOSIS — J3089 Other allergic rhinitis: Secondary | ICD-10-CM | POA: Diagnosis not present

## 2021-03-02 DIAGNOSIS — J301 Allergic rhinitis due to pollen: Secondary | ICD-10-CM | POA: Diagnosis not present

## 2021-03-02 DIAGNOSIS — J3081 Allergic rhinitis due to animal (cat) (dog) hair and dander: Secondary | ICD-10-CM | POA: Diagnosis not present

## 2021-03-04 ENCOUNTER — Other Ambulatory Visit: Payer: Self-pay | Admitting: Infectious Diseases

## 2021-03-04 DIAGNOSIS — B20 Human immunodeficiency virus [HIV] disease: Secondary | ICD-10-CM

## 2021-03-09 DIAGNOSIS — J3089 Other allergic rhinitis: Secondary | ICD-10-CM | POA: Diagnosis not present

## 2021-03-09 DIAGNOSIS — J301 Allergic rhinitis due to pollen: Secondary | ICD-10-CM | POA: Diagnosis not present

## 2021-03-09 DIAGNOSIS — J3081 Allergic rhinitis due to animal (cat) (dog) hair and dander: Secondary | ICD-10-CM | POA: Diagnosis not present

## 2021-03-16 DIAGNOSIS — J301 Allergic rhinitis due to pollen: Secondary | ICD-10-CM | POA: Diagnosis not present

## 2021-03-16 DIAGNOSIS — J3089 Other allergic rhinitis: Secondary | ICD-10-CM | POA: Diagnosis not present

## 2021-03-16 DIAGNOSIS — J3081 Allergic rhinitis due to animal (cat) (dog) hair and dander: Secondary | ICD-10-CM | POA: Diagnosis not present

## 2021-03-23 DIAGNOSIS — J3081 Allergic rhinitis due to animal (cat) (dog) hair and dander: Secondary | ICD-10-CM | POA: Diagnosis not present

## 2021-03-23 DIAGNOSIS — J3089 Other allergic rhinitis: Secondary | ICD-10-CM | POA: Diagnosis not present

## 2021-03-23 DIAGNOSIS — J301 Allergic rhinitis due to pollen: Secondary | ICD-10-CM | POA: Diagnosis not present

## 2021-03-25 DIAGNOSIS — N1831 Chronic kidney disease, stage 3a: Secondary | ICD-10-CM | POA: Diagnosis not present

## 2021-03-25 DIAGNOSIS — E669 Obesity, unspecified: Secondary | ICD-10-CM | POA: Diagnosis not present

## 2021-03-25 DIAGNOSIS — R635 Abnormal weight gain: Secondary | ICD-10-CM | POA: Diagnosis not present

## 2021-03-25 DIAGNOSIS — I10 Essential (primary) hypertension: Secondary | ICD-10-CM | POA: Diagnosis not present

## 2021-03-25 DIAGNOSIS — E7800 Pure hypercholesterolemia, unspecified: Secondary | ICD-10-CM | POA: Diagnosis not present

## 2021-03-30 DIAGNOSIS — J3089 Other allergic rhinitis: Secondary | ICD-10-CM | POA: Diagnosis not present

## 2021-03-30 DIAGNOSIS — J3081 Allergic rhinitis due to animal (cat) (dog) hair and dander: Secondary | ICD-10-CM | POA: Diagnosis not present

## 2021-03-30 DIAGNOSIS — J301 Allergic rhinitis due to pollen: Secondary | ICD-10-CM | POA: Diagnosis not present

## 2021-04-08 DIAGNOSIS — J301 Allergic rhinitis due to pollen: Secondary | ICD-10-CM | POA: Diagnosis not present

## 2021-04-08 DIAGNOSIS — J3089 Other allergic rhinitis: Secondary | ICD-10-CM | POA: Diagnosis not present

## 2021-04-08 DIAGNOSIS — J3081 Allergic rhinitis due to animal (cat) (dog) hair and dander: Secondary | ICD-10-CM | POA: Diagnosis not present

## 2021-04-13 DIAGNOSIS — J301 Allergic rhinitis due to pollen: Secondary | ICD-10-CM | POA: Diagnosis not present

## 2021-04-13 DIAGNOSIS — J3089 Other allergic rhinitis: Secondary | ICD-10-CM | POA: Diagnosis not present

## 2021-04-13 DIAGNOSIS — J3081 Allergic rhinitis due to animal (cat) (dog) hair and dander: Secondary | ICD-10-CM | POA: Diagnosis not present

## 2021-04-20 DIAGNOSIS — J3081 Allergic rhinitis due to animal (cat) (dog) hair and dander: Secondary | ICD-10-CM | POA: Diagnosis not present

## 2021-04-20 DIAGNOSIS — J3089 Other allergic rhinitis: Secondary | ICD-10-CM | POA: Diagnosis not present

## 2021-04-20 DIAGNOSIS — J301 Allergic rhinitis due to pollen: Secondary | ICD-10-CM | POA: Diagnosis not present

## 2021-04-22 DIAGNOSIS — Z1339 Encounter for screening examination for other mental health and behavioral disorders: Secondary | ICD-10-CM | POA: Diagnosis not present

## 2021-04-22 DIAGNOSIS — R5383 Other fatigue: Secondary | ICD-10-CM | POA: Diagnosis not present

## 2021-04-22 DIAGNOSIS — D539 Nutritional anemia, unspecified: Secondary | ICD-10-CM | POA: Diagnosis not present

## 2021-04-22 DIAGNOSIS — E559 Vitamin D deficiency, unspecified: Secondary | ICD-10-CM | POA: Diagnosis not present

## 2021-04-22 DIAGNOSIS — N1831 Chronic kidney disease, stage 3a: Secondary | ICD-10-CM | POA: Diagnosis not present

## 2021-04-22 DIAGNOSIS — Z131 Encounter for screening for diabetes mellitus: Secondary | ICD-10-CM | POA: Diagnosis not present

## 2021-04-22 DIAGNOSIS — Z1159 Encounter for screening for other viral diseases: Secondary | ICD-10-CM | POA: Diagnosis not present

## 2021-04-22 DIAGNOSIS — I10 Essential (primary) hypertension: Secondary | ICD-10-CM | POA: Diagnosis not present

## 2021-04-22 DIAGNOSIS — Z0000 Encounter for general adult medical examination without abnormal findings: Secondary | ICD-10-CM | POA: Diagnosis not present

## 2021-04-22 DIAGNOSIS — E7800 Pure hypercholesterolemia, unspecified: Secondary | ICD-10-CM | POA: Diagnosis not present

## 2021-04-28 DIAGNOSIS — J3081 Allergic rhinitis due to animal (cat) (dog) hair and dander: Secondary | ICD-10-CM | POA: Diagnosis not present

## 2021-04-28 DIAGNOSIS — J3089 Other allergic rhinitis: Secondary | ICD-10-CM | POA: Diagnosis not present

## 2021-04-28 DIAGNOSIS — J301 Allergic rhinitis due to pollen: Secondary | ICD-10-CM | POA: Diagnosis not present

## 2021-05-13 DIAGNOSIS — J301 Allergic rhinitis due to pollen: Secondary | ICD-10-CM | POA: Diagnosis not present

## 2021-05-13 DIAGNOSIS — J3089 Other allergic rhinitis: Secondary | ICD-10-CM | POA: Diagnosis not present

## 2021-05-13 DIAGNOSIS — J3081 Allergic rhinitis due to animal (cat) (dog) hair and dander: Secondary | ICD-10-CM | POA: Diagnosis not present

## 2021-05-20 DIAGNOSIS — J301 Allergic rhinitis due to pollen: Secondary | ICD-10-CM | POA: Diagnosis not present

## 2021-05-20 DIAGNOSIS — J3089 Other allergic rhinitis: Secondary | ICD-10-CM | POA: Diagnosis not present

## 2021-05-20 DIAGNOSIS — J3081 Allergic rhinitis due to animal (cat) (dog) hair and dander: Secondary | ICD-10-CM | POA: Diagnosis not present

## 2021-05-25 DIAGNOSIS — J3081 Allergic rhinitis due to animal (cat) (dog) hair and dander: Secondary | ICD-10-CM | POA: Diagnosis not present

## 2021-05-25 DIAGNOSIS — J3089 Other allergic rhinitis: Secondary | ICD-10-CM | POA: Diagnosis not present

## 2021-05-25 DIAGNOSIS — J301 Allergic rhinitis due to pollen: Secondary | ICD-10-CM | POA: Diagnosis not present

## 2021-05-26 ENCOUNTER — Other Ambulatory Visit: Payer: Self-pay

## 2021-05-26 ENCOUNTER — Ambulatory Visit (INDEPENDENT_AMBULATORY_CARE_PROVIDER_SITE_OTHER): Payer: BC Managed Care – PPO

## 2021-05-26 ENCOUNTER — Ambulatory Visit (INDEPENDENT_AMBULATORY_CARE_PROVIDER_SITE_OTHER): Payer: BC Managed Care – PPO | Admitting: Infectious Diseases

## 2021-05-26 VITALS — BP 131/72 | HR 85 | Resp 16 | Ht 76.0 in | Wt 254.3 lb

## 2021-05-26 DIAGNOSIS — B20 Human immunodeficiency virus [HIV] disease: Secondary | ICD-10-CM

## 2021-05-26 DIAGNOSIS — Z23 Encounter for immunization: Secondary | ICD-10-CM | POA: Diagnosis not present

## 2021-05-26 DIAGNOSIS — B350 Tinea barbae and tinea capitis: Secondary | ICD-10-CM | POA: Diagnosis not present

## 2021-05-26 DIAGNOSIS — J301 Allergic rhinitis due to pollen: Secondary | ICD-10-CM

## 2021-05-26 DIAGNOSIS — Z79899 Other long term (current) drug therapy: Secondary | ICD-10-CM

## 2021-05-26 DIAGNOSIS — Z113 Encounter for screening for infections with a predominantly sexual mode of transmission: Secondary | ICD-10-CM

## 2021-05-26 DIAGNOSIS — I10 Essential (primary) hypertension: Secondary | ICD-10-CM | POA: Diagnosis not present

## 2021-05-26 NOTE — Assessment & Plan Note (Signed)
Appreciate Dr Rush Landmark f/u.  Back on his inhalers for now.

## 2021-05-26 NOTE — Assessment & Plan Note (Signed)
He is doing well Will check his labs today Offered/refused condoms.  Not sexually active mpox vax when available COVID vax today Refuses flu vax.  rtc in 9 months.

## 2021-05-26 NOTE — Progress Notes (Signed)
   Subjective:    Patient ID: Lee [name], male  DOB: 11/15/1980, 40 y.o.        MRN: [id number]   HPI 40 yo M with hx of HTN and HIV+ dx 08-03-16.  He had first visit 2-15 and was also found to have syphilis. He received IM PEN G on 09-14-16.  His genotype is naive. He is HLA negative.  Has been on triumeq but at 5-31 he had increased LFTs and Cr on labs. He had repeat labs done on 6-8 which confirmed this. He was seen in f/u with pharm 02-2017 and was changed to biktarvy.   ART has been going well- no missed, forgotten doses.  Nothing happening in love life.  Doesn't want Flu vax, will take COVID vax, mpox Still getting allergy shots, back on inhalers.   HIV 1 RNA Quant (copies/mL)  Date Value  12/05/2019 35 (H)  02/20/2019 <20 DETECTED (A)  02/21/2018 28 (H)   CD4 T Cell Abs (/uL)  Date Value  12/05/2019 538  02/20/2019 524  02/21/2018 600     Health Maintenance  Topic Date Due  . COVID-19 Vaccine (4 - Booster for Pfizer series) 10/20/2020  . INFLUENZA VACCINE  02/21/2021  . Pneumococcal Vaccine 19-64 Years old (3 - PPSV23 if available, else PCV20) 09/20/2021  . TETANUS/TDAP  02/19/2029  . Hepatitis C Screening  Completed  . HIV Screening  Completed  . HPV VACCINES  Aged Out      Review of Systems  Constitutional:  Negative for chills, fever and weight loss.  HENT:  Positive for congestion.   Respiratory:  Negative for cough and shortness of breath.   Cardiovascular:  Negative for leg swelling.  Gastrointestinal:  Negative for constipation and diarrhea.  Genitourinary:  Negative for dysuria.  Psychiatric/Behavioral:  The patient does not have insomnia.    Please see HPI. All other systems reviewed and negative.     Objective:  Physical Exam Vitals reviewed.  Constitutional:      Appearance: Normal appearance.  HENT:     Mouth/Throat:     Mouth: Mucous membranes are moist.     Pharynx: No oropharyngeal exudate.  Eyes:     Extraocular Movements:  Extraocular movements intact.     Pupils: Pupils are equal, round, and reactive to light.  Cardiovascular:     Rate and Rhythm: Normal rate and regular rhythm.  Pulmonary:     Effort: Pulmonary effort is normal.     Breath sounds: Normal breath sounds.  Abdominal:     General: Bowel sounds are normal.     Palpations: Abdomen is soft.     Tenderness: There is no abdominal tenderness.  Musculoskeletal:     Cervical back: Normal range of motion and neck supple.     Right lower leg: No edema.     Left lower leg: No edema.  Neurological:     General: No focal deficit present.     Mental Status: He is alert and oriented to person, place, and time.  Psychiatric:        Mood and Affect: Mood normal.           Assessment & Plan:   

## 2021-05-26 NOTE — Assessment & Plan Note (Signed)
BP slightly up, appreciate his PCP f/u Check Cr today

## 2021-05-26 NOTE — Assessment & Plan Note (Signed)
Will follow, consider derm eval if worsening.

## 2021-05-27 DIAGNOSIS — R635 Abnormal weight gain: Secondary | ICD-10-CM | POA: Diagnosis not present

## 2021-05-27 DIAGNOSIS — E669 Obesity, unspecified: Secondary | ICD-10-CM | POA: Diagnosis not present

## 2021-05-27 DIAGNOSIS — E7800 Pure hypercholesterolemia, unspecified: Secondary | ICD-10-CM | POA: Diagnosis not present

## 2021-05-27 DIAGNOSIS — N1831 Chronic kidney disease, stage 3a: Secondary | ICD-10-CM | POA: Diagnosis not present

## 2021-05-27 DIAGNOSIS — I10 Essential (primary) hypertension: Secondary | ICD-10-CM | POA: Diagnosis not present

## 2021-05-27 LAB — T-HELPER CELL (CD4) - (RCID CLINIC ONLY)
CD4 % Helper T Cell: 39 % (ref 33–65)
CD4 T Cell Abs: 525 /uL (ref 400–1790)

## 2021-05-30 LAB — COMPREHENSIVE METABOLIC PANEL
AG Ratio: 1.1 (calc) (ref 1.0–2.5)
ALT: 9 U/L (ref 9–46)
AST: 14 U/L (ref 10–40)
Albumin: 4.1 g/dL (ref 3.6–5.1)
Alkaline phosphatase (APISO): 69 U/L (ref 36–130)
BUN/Creatinine Ratio: 15 (calc) (ref 6–22)
BUN: 22 mg/dL (ref 7–25)
CO2: 19 mmol/L — ABNORMAL LOW (ref 20–32)
Calcium: 8.9 mg/dL (ref 8.6–10.3)
Chloride: 109 mmol/L (ref 98–110)
Creat: 1.44 mg/dL — ABNORMAL HIGH (ref 0.60–1.26)
Globulin: 3.7 g/dL (calc) (ref 1.9–3.7)
Glucose, Bld: 78 mg/dL (ref 65–99)
Potassium: 4.1 mmol/L (ref 3.5–5.3)
Sodium: 135 mmol/L (ref 135–146)
Total Bilirubin: 0.6 mg/dL (ref 0.2–1.2)
Total Protein: 7.8 g/dL (ref 6.1–8.1)

## 2021-05-30 LAB — CBC
HCT: 44.7 % (ref 38.5–50.0)
Hemoglobin: 15.6 g/dL (ref 13.2–17.1)
MCH: 31.1 pg (ref 27.0–33.0)
MCHC: 34.9 g/dL (ref 32.0–36.0)
MCV: 89.2 fL (ref 80.0–100.0)
MPV: 9.6 fL (ref 7.5–12.5)
Platelets: 156 10*3/uL (ref 140–400)
RBC: 5.01 10*6/uL (ref 4.20–5.80)
RDW: 13.6 % (ref 11.0–15.0)
WBC: 4.6 10*3/uL (ref 3.8–10.8)

## 2021-05-30 LAB — HIV-1 RNA QUANT-NO REFLEX-BLD
HIV 1 RNA Quant: 38 Copies/mL — ABNORMAL HIGH
HIV-1 RNA Quant, Log: 1.58 Log cps/mL — ABNORMAL HIGH

## 2021-05-30 LAB — RPR TITER: RPR Titer: 1:8 {titer} — ABNORMAL HIGH

## 2021-05-30 LAB — RPR: RPR Ser Ql: REACTIVE — AB

## 2021-05-30 LAB — FLUORESCENT TREPONEMAL AB(FTA)-IGG-BLD: Fluorescent Treponemal ABS: REACTIVE — AB

## 2021-06-01 DIAGNOSIS — J3081 Allergic rhinitis due to animal (cat) (dog) hair and dander: Secondary | ICD-10-CM | POA: Diagnosis not present

## 2021-06-01 DIAGNOSIS — J3089 Other allergic rhinitis: Secondary | ICD-10-CM | POA: Diagnosis not present

## 2021-06-01 DIAGNOSIS — J301 Allergic rhinitis due to pollen: Secondary | ICD-10-CM | POA: Diagnosis not present

## 2021-06-02 DIAGNOSIS — J3081 Allergic rhinitis due to animal (cat) (dog) hair and dander: Secondary | ICD-10-CM | POA: Diagnosis not present

## 2021-06-02 DIAGNOSIS — J3089 Other allergic rhinitis: Secondary | ICD-10-CM | POA: Diagnosis not present

## 2021-06-08 DIAGNOSIS — J3081 Allergic rhinitis due to animal (cat) (dog) hair and dander: Secondary | ICD-10-CM | POA: Diagnosis not present

## 2021-06-08 DIAGNOSIS — J301 Allergic rhinitis due to pollen: Secondary | ICD-10-CM | POA: Diagnosis not present

## 2021-06-08 DIAGNOSIS — J3089 Other allergic rhinitis: Secondary | ICD-10-CM | POA: Diagnosis not present

## 2021-06-15 DIAGNOSIS — J3089 Other allergic rhinitis: Secondary | ICD-10-CM | POA: Diagnosis not present

## 2021-06-15 DIAGNOSIS — J301 Allergic rhinitis due to pollen: Secondary | ICD-10-CM | POA: Diagnosis not present

## 2021-06-15 DIAGNOSIS — J3081 Allergic rhinitis due to animal (cat) (dog) hair and dander: Secondary | ICD-10-CM | POA: Diagnosis not present

## 2021-06-22 ENCOUNTER — Other Ambulatory Visit: Payer: Self-pay | Admitting: Adult Health

## 2021-06-22 DIAGNOSIS — J3081 Allergic rhinitis due to animal (cat) (dog) hair and dander: Secondary | ICD-10-CM | POA: Diagnosis not present

## 2021-06-22 DIAGNOSIS — J301 Allergic rhinitis due to pollen: Secondary | ICD-10-CM | POA: Diagnosis not present

## 2021-06-22 DIAGNOSIS — J3089 Other allergic rhinitis: Secondary | ICD-10-CM | POA: Diagnosis not present

## 2021-06-29 DIAGNOSIS — J3081 Allergic rhinitis due to animal (cat) (dog) hair and dander: Secondary | ICD-10-CM | POA: Diagnosis not present

## 2021-06-29 DIAGNOSIS — J301 Allergic rhinitis due to pollen: Secondary | ICD-10-CM | POA: Diagnosis not present

## 2021-06-29 DIAGNOSIS — J3089 Other allergic rhinitis: Secondary | ICD-10-CM | POA: Diagnosis not present

## 2021-07-01 DIAGNOSIS — E7800 Pure hypercholesterolemia, unspecified: Secondary | ICD-10-CM | POA: Diagnosis not present

## 2021-07-01 DIAGNOSIS — N1831 Chronic kidney disease, stage 3a: Secondary | ICD-10-CM | POA: Diagnosis not present

## 2021-07-01 DIAGNOSIS — R635 Abnormal weight gain: Secondary | ICD-10-CM | POA: Diagnosis not present

## 2021-07-01 DIAGNOSIS — E669 Obesity, unspecified: Secondary | ICD-10-CM | POA: Diagnosis not present

## 2021-07-01 DIAGNOSIS — I10 Essential (primary) hypertension: Secondary | ICD-10-CM | POA: Diagnosis not present

## 2021-07-02 ENCOUNTER — Other Ambulatory Visit: Payer: Self-pay | Admitting: Infectious Diseases

## 2021-07-02 DIAGNOSIS — B20 Human immunodeficiency virus [HIV] disease: Secondary | ICD-10-CM

## 2021-07-06 DIAGNOSIS — J301 Allergic rhinitis due to pollen: Secondary | ICD-10-CM | POA: Diagnosis not present

## 2021-07-06 DIAGNOSIS — J3089 Other allergic rhinitis: Secondary | ICD-10-CM | POA: Diagnosis not present

## 2021-07-06 DIAGNOSIS — J3081 Allergic rhinitis due to animal (cat) (dog) hair and dander: Secondary | ICD-10-CM | POA: Diagnosis not present

## 2021-07-13 DIAGNOSIS — J301 Allergic rhinitis due to pollen: Secondary | ICD-10-CM | POA: Diagnosis not present

## 2021-07-13 DIAGNOSIS — J3089 Other allergic rhinitis: Secondary | ICD-10-CM | POA: Diagnosis not present

## 2021-07-13 DIAGNOSIS — J3081 Allergic rhinitis due to animal (cat) (dog) hair and dander: Secondary | ICD-10-CM | POA: Diagnosis not present

## 2021-07-14 ENCOUNTER — Emergency Department (HOSPITAL_BASED_OUTPATIENT_CLINIC_OR_DEPARTMENT_OTHER): Payer: BC Managed Care – PPO

## 2021-07-14 ENCOUNTER — Encounter (HOSPITAL_BASED_OUTPATIENT_CLINIC_OR_DEPARTMENT_OTHER): Payer: Self-pay | Admitting: Emergency Medicine

## 2021-07-14 ENCOUNTER — Emergency Department (HOSPITAL_BASED_OUTPATIENT_CLINIC_OR_DEPARTMENT_OTHER)
Admission: EM | Admit: 2021-07-14 | Discharge: 2021-07-14 | Disposition: A | Discharge status: Home / Self Care | Payer: BC Managed Care – PPO | Attending: Emergency Medicine | Admitting: Emergency Medicine

## 2021-07-14 DIAGNOSIS — N183 Chronic kidney disease, stage 3 unspecified: Secondary | ICD-10-CM | POA: Diagnosis not present

## 2021-07-14 DIAGNOSIS — Z21 Asymptomatic human immunodeficiency virus [HIV] infection status: Secondary | ICD-10-CM | POA: Insufficient documentation

## 2021-07-14 DIAGNOSIS — I129 Hypertensive chronic kidney disease with stage 1 through stage 4 chronic kidney disease, or unspecified chronic kidney disease: Secondary | ICD-10-CM | POA: Diagnosis not present

## 2021-07-14 DIAGNOSIS — R0600 Dyspnea, unspecified: Secondary | ICD-10-CM

## 2021-07-14 DIAGNOSIS — Z79899 Other long term (current) drug therapy: Secondary | ICD-10-CM | POA: Diagnosis not present

## 2021-07-14 DIAGNOSIS — R059 Cough, unspecified: Secondary | ICD-10-CM | POA: Diagnosis not present

## 2021-07-14 DIAGNOSIS — R062 Wheezing: Secondary | ICD-10-CM | POA: Insufficient documentation

## 2021-07-14 DIAGNOSIS — R0602 Shortness of breath: Secondary | ICD-10-CM | POA: Diagnosis not present

## 2021-07-14 DIAGNOSIS — Z20822 Contact with and (suspected) exposure to covid-19: Secondary | ICD-10-CM | POA: Diagnosis not present

## 2021-07-14 LAB — CBC WITH DIFFERENTIAL/PLATELET
Abs Immature Granulocytes: 0.01 10*3/uL (ref 0.00–0.07)
Basophils Absolute: 0 10*3/uL (ref 0.0–0.1)
Basophils Relative: 0 %
Eosinophils Absolute: 0 10*3/uL (ref 0.0–0.5)
Eosinophils Relative: 0 %
HCT: 44.9 % (ref 39.0–52.0)
Hemoglobin: 16.3 g/dL (ref 13.0–17.0)
Immature Granulocytes: 0 %
Lymphocytes Relative: 26 %
Lymphs Abs: 1.7 10*3/uL (ref 0.7–4.0)
MCH: 30.9 pg (ref 26.0–34.0)
MCHC: 36.3 g/dL — ABNORMAL HIGH (ref 30.0–36.0)
MCV: 85.2 fL (ref 80.0–100.0)
Monocytes Absolute: 0.6 10*3/uL (ref 0.1–1.0)
Monocytes Relative: 10 %
Neutro Abs: 4 10*3/uL (ref 1.7–7.7)
Neutrophils Relative %: 64 %
Platelets: 167 10*3/uL (ref 150–400)
RBC: 5.27 MIL/uL (ref 4.22–5.81)
RDW: 13.1 % (ref 11.5–15.5)
WBC: 6.3 10*3/uL (ref 4.0–10.5)
nRBC: 0 % (ref 0.0–0.2)

## 2021-07-14 LAB — BRAIN NATRIURETIC PEPTIDE: B Natriuretic Peptide: 20 pg/mL (ref 0.0–100.0)

## 2021-07-14 LAB — RESP PANEL BY RT-PCR (FLU A&B, COVID) ARPGX2
Influenza A by PCR: NEGATIVE
Influenza B by PCR: NEGATIVE
SARS Coronavirus 2 by RT PCR: NEGATIVE

## 2021-07-14 LAB — BASIC METABOLIC PANEL
Anion gap: 7 (ref 5–15)
BUN: 19 mg/dL (ref 6–20)
CO2: 20 mmol/L — ABNORMAL LOW (ref 22–32)
Calcium: 8.7 mg/dL — ABNORMAL LOW (ref 8.9–10.3)
Chloride: 110 mmol/L (ref 98–111)
Creatinine, Ser: 1.67 mg/dL — ABNORMAL HIGH (ref 0.61–1.24)
GFR, Estimated: 53 mL/min — ABNORMAL LOW (ref >60)
Glucose, Bld: 107 mg/dL — ABNORMAL HIGH (ref 70–99)
Potassium: 3.6 mmol/L (ref 3.5–5.1)
Sodium: 137 mmol/L (ref 135–145)

## 2021-07-14 LAB — TROPONIN I (HIGH SENSITIVITY): Troponin I (High Sensitivity): 2 ng/L (ref <18)

## 2021-07-14 MED ORDER — IPRATROPIUM-ALBUTEROL 0.5-2.5 (3) MG/3ML IN SOLN
RESPIRATORY_TRACT | Status: AC
  Administered 2021-07-14: 04:00:00 3 mL via RESPIRATORY_TRACT
  Filled 2021-07-14: qty 3

## 2021-07-14 MED ORDER — MAGNESIUM SULFATE 50 % IJ SOLN: 2.0000 g | Freq: Once | INTRAMUSCULAR | Status: DC

## 2021-07-14 MED ORDER — PREDNISONE 10 MG (21) PO TBPK: ORAL_TABLET | Freq: Every day | ORAL | 0 refills | Status: DC

## 2021-07-14 MED ORDER — ALBUTEROL SULFATE (2.5 MG/3ML) 0.083% IN NEBU
5.0000 mg | INHALATION_SOLUTION | Freq: Once | RESPIRATORY_TRACT | Status: AC
  Administered 2021-07-14: 04:00:00 5 mg via RESPIRATORY_TRACT
  Filled 2021-07-14: qty 6

## 2021-07-14 MED ORDER — METHYLPREDNISOLONE SODIUM SUCC 125 MG IJ SOLR
125.0000 mg | Freq: Once | INTRAMUSCULAR | Status: AC
  Administered 2021-07-14: 05:00:00 125 mg via INTRAVENOUS
  Filled 2021-07-14: qty 2

## 2021-07-14 MED ORDER — SODIUM CHLORIDE 0.9 % IV SOLN: INTRAVENOUS | Status: DC | PRN

## 2021-07-14 MED ORDER — ALBUTEROL SULFATE (2.5 MG/3ML) 0.083% IN NEBU
5.0000 mg | INHALATION_SOLUTION | Freq: Once | RESPIRATORY_TRACT | Status: AC
  Administered 2021-07-14: 05:00:00 5 mg via RESPIRATORY_TRACT
  Filled 2021-07-14: qty 6

## 2021-07-14 MED ORDER — ALBUTEROL SULFATE (2.5 MG/3ML) 0.083% IN NEBU: 2.5000 mg | INHALATION_SOLUTION | Freq: Once | RESPIRATORY_TRACT | Status: AC

## 2021-07-14 MED ORDER — ALBUTEROL SULFATE (2.5 MG/3ML) 0.083% IN NEBU
INHALATION_SOLUTION | RESPIRATORY_TRACT | Status: AC
  Administered 2021-07-14: 04:00:00 2.5 mg via RESPIRATORY_TRACT
  Filled 2021-07-14: qty 3

## 2021-07-14 MED ORDER — MAGNESIUM SULFATE 2 GM/50ML IV SOLN
2.0000 g | Freq: Once | INTRAVENOUS | Status: AC
  Administered 2021-07-14: 05:00:00 2 g via INTRAVENOUS

## 2021-07-14 MED ORDER — IPRATROPIUM-ALBUTEROL 0.5-2.5 (3) MG/3ML IN SOLN: 3.0000 mL | Freq: Once | RESPIRATORY_TRACT | Status: AC

## 2021-07-14 NOTE — ED Triage Notes (Signed)
Pt c/o shob. Pt has congested cough x several days. Pt states shob became worse today after painting bathroom

## 2021-07-14 NOTE — ED Triage Notes (Signed)
Pt oxygen sta 86% on RA. Pt placed on oxygen 3 L via [place] with improvement of sat to 92%. RT at bedside.

## 2021-07-14 NOTE — Discharge Instructions (Addendum)
Begin taking prednisone as prescribed.  Use your albuterol inhaler, 2 puffs every 4 hours as needed for wheezing.  Return to the emergency department if symptoms significantly worsen or change.

## 2021-07-14 NOTE — ED Provider Notes (Signed)
[facility] EMERGENCY DEPARTMENT Provider Note   CSN: [id number] Arrival date & time: 07/14/21  6440     History Chief Complaint  Patient presents with   Cough    Lee [name] is a 40 y.o. male.  Patient is a 40 year old male with past medical history of HIV disease, chronic renal insufficiency, hypertension.  Patient presenting today with complaints of shortness of breath.  He reports being congested for the past several days, then began wheezing this evening after painting his bathroom.  He denies any chest pain or leg swelling.  He denies fevers or chills.  He denies ill contacts.  The history is provided by the patient.  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   Shingles     Patient Active Problem List   Diagnosis Date Noted   Hearing loss 09/09/2020   Otitis 02/20/2019   CKD (chronic kidney disease) stage 3, GFR 30-59 ml/min ([place]) 02/20/2019   Syphilis 02/21/2018   Hepatitis B immune 09/20/2016   Essential hypertension 08/03/2016   Shingles 08/08/2011   TENDINITIS, LEFT WRIST 01/13/2009   SYNOVITIS 04/09/2008   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[place]) 10/16/2007   Allergic rhinitis 10/03/2007   Tinea barbae 10/03/2007    Past Surgical History:  Procedure Laterality Date   LAPAROSCOPIC APPENDECTOMY N/A 03/12/2020   Procedure: APPENDECTOMY LAPAROSCOPIC;  Surgeon: Leighton Ruff, MD;  Location: [facility] ORS;  Service: General;  Laterality: N/A;   TYMPANOSTOMY TUBE PLACEMENT     WISDOM TOOTH EXTRACTION     WRIST SURGERY          Family History  Problem Relation Age of Onset   Hypercalcemia Mother    Hypertension Mother    Hypertension Father     Social History   Tobacco Use   Smoking status: Never   Smokeless tobacco: Never  Vaping Use   Vaping Use: Never used  Substance Use Topics   Alcohol use: No   Drug use: No    Home Medications Prior to Admission medications   Medication Sig Start Date End Date Taking? Authorizing Provider  albuterol (PROVENTIL HFA;VENTOLIN HFA) 108 (90 Base) MCG/ACT inhaler Inhale 2 puffs into the lungs every 6 (six) hours as needed for wheezing or shortness of breath. 09/14/17   Nafziger, Tommi Rumps, NP  amLODipine (NORVASC) 10 MG tablet Take 1 tablet (10 mg total) by mouth daily. 01/12/21   Campbell Riches, MD  azelastine (ASTELIN) 0.1 % nasal spray 1-2 puffs in each nostril twice daily    Mosetta Anis, MD  [name] 50-200-25 MG TABS tablet TAKE 1 TABLET DAILY 07/04/21   Campbell Riches, MD  EPINEPHrine 0.3 mg/0.3 mL IJ SOAJ injection Inject 0.3 mg into the muscle once.    Mosetta Anis, MD  losartan (COZAAR) 100 MG tablet Take 1 tablet (100 mg total) by mouth daily. 01/12/21   Campbell Riches, MD  OZEMPIC, 1 MG/DOSE, 4 MG/3ML SOPN Inject 1 mg into the skin once a week. 02/25/20   [provider]  phentermine (ADIPEX-P) 37.5 MG tablet Take 37.5-56.25  mg by mouth daily. 06/26/19   [provider]  sodium chloride (OCEAN) 0.65 % SOLN nasal spray Place 1 spray into both nostrils as needed for congestion. 09/12/17   Debbe Odea, MD  topiramate (TOPAMAX) 100 MG tablet Take 100 mg by mouth at bedtime. 06/06/19   [provider]  Vitamin D, Ergocalciferol, (DRISDOL) 1.25 MG (50000 UT) CAPS capsule Take 50,000 Units by mouth once a week. Patient not taking: Reported on 09/09/2020 06/06/19   [provider]    Allergies    Lisinopril and Nickel  Review of Systems   Review of Systems  Respiratory:  Positive for cough.   All other systems  reviewed and are negative.  Physical Exam Updated Vital Signs Pulse (!) 106    Temp 98.4 F (36.9 C) (Oral)    Resp (!) 24    Ht 6\' 4"  (1.93 m)    Wt 112.5 kg    SpO2 91%    BMI 30.19 kg/m   Physical Exam Vitals and nursing note reviewed.  Constitutional:      General: He is not in acute distress.    Appearance: He is well-developed. He is not diaphoretic.  HENT:     Head: Normocephalic and atraumatic.  Cardiovascular:     Rate and Rhythm: Normal rate and regular rhythm.     Heart sounds: No murmur heard.   No friction rub.  Pulmonary:     Effort: Pulmonary effort is normal. No respiratory distress.     Breath sounds: Wheezing present. No rales.     Comments: Patient with expiratory wheezes bilaterally. Abdominal:     General: Bowel sounds are normal. There is no distension.     Palpations: Abdomen is soft.     Tenderness: There is no abdominal tenderness.  Musculoskeletal:        General: Normal range of motion.     Cervical back: Normal range of motion and neck supple.  Skin:    General: Skin is warm and dry.  Neurological:     Mental Status: He is alert and oriented to person, place, and time.     Coordination: Coordination normal.    ED Results / Procedures / Treatments   Labs (all labs ordered are listed, but only abnormal results are displayed) Labs Reviewed  RESP PANEL BY RT-PCR (FLU A&B, COVID) ARPGX2  BRAIN NATRIURETIC PEPTIDE  CBC WITH DIFFERENTIAL/PLATELET  BASIC METABOLIC PANEL  TROPONIN I (HIGH SENSITIVITY)    EKG None  Radiology No results found.  Procedures Procedures   Medications Ordered in ED   Final Clinical Impression(s) / ED Diagnoses Final diagnoses:  None   Patient presenting here with complaints of wheezing that began while he was painting a bathroom.  He arrived in moderate respiratory distress with saturations of 86%.  Patient started on albuterol nebs x3 along with Solu-Medrol and magnesium.  He is now feeling markedly  improved.  Wheezing is minimal and oxygen saturations are in the upper 90s.  Patient seems appropriate for discharge with a prednisone taper and continued use of his inhaler.  I suspect he had some sort of bronchospasm related to the paint fumes.  His chest x-ray is clear and nothing suggest an infectious etiology.  Rx / DC Orders ED Discharge Orders     None        Veryl Speak, MD 07/14/21 [phone]

## 2021-08-03 DIAGNOSIS — J3081 Allergic rhinitis due to animal (cat) (dog) hair and dander: Secondary | ICD-10-CM | POA: Diagnosis not present

## 2021-08-03 DIAGNOSIS — J3089 Other allergic rhinitis: Secondary | ICD-10-CM | POA: Diagnosis not present

## 2021-08-03 DIAGNOSIS — J301 Allergic rhinitis due to pollen: Secondary | ICD-10-CM | POA: Diagnosis not present

## 2021-08-05 ENCOUNTER — Telehealth: Payer: Self-pay

## 2021-08-05 NOTE — Telephone Encounter (Signed)
Called patient to offer Jynneos vaccine. Patient declines and states he is no longer interested in being vaccinated against monkeypox. Eugenia Mcalpine

## 2021-08-05 NOTE — Telephone Encounter (Signed)
Offered patient appointment for MPX vaccine. Patient states he is no longer interested in being vaccinated against monkeypox at this time. Eugenia Mcalpine

## 2021-08-10 DIAGNOSIS — J301 Allergic rhinitis due to pollen: Secondary | ICD-10-CM | POA: Diagnosis not present

## 2021-08-10 DIAGNOSIS — J3081 Allergic rhinitis due to animal (cat) (dog) hair and dander: Secondary | ICD-10-CM | POA: Diagnosis not present

## 2021-08-10 DIAGNOSIS — J3089 Other allergic rhinitis: Secondary | ICD-10-CM | POA: Diagnosis not present

## 2021-08-17 DIAGNOSIS — J3089 Other allergic rhinitis: Secondary | ICD-10-CM | POA: Diagnosis not present

## 2021-08-17 DIAGNOSIS — J3081 Allergic rhinitis due to animal (cat) (dog) hair and dander: Secondary | ICD-10-CM | POA: Diagnosis not present

## 2021-08-17 DIAGNOSIS — J301 Allergic rhinitis due to pollen: Secondary | ICD-10-CM | POA: Diagnosis not present

## 2021-08-19 DIAGNOSIS — N1831 Chronic kidney disease, stage 3a: Secondary | ICD-10-CM | POA: Diagnosis not present

## 2021-08-19 DIAGNOSIS — R635 Abnormal weight gain: Secondary | ICD-10-CM | POA: Diagnosis not present

## 2021-08-19 DIAGNOSIS — E7800 Pure hypercholesterolemia, unspecified: Secondary | ICD-10-CM | POA: Diagnosis not present

## 2021-08-19 DIAGNOSIS — E669 Obesity, unspecified: Secondary | ICD-10-CM | POA: Diagnosis not present

## 2021-08-19 DIAGNOSIS — I10 Essential (primary) hypertension: Secondary | ICD-10-CM | POA: Diagnosis not present

## 2021-08-24 DIAGNOSIS — J3081 Allergic rhinitis due to animal (cat) (dog) hair and dander: Secondary | ICD-10-CM | POA: Diagnosis not present

## 2021-08-24 DIAGNOSIS — J301 Allergic rhinitis due to pollen: Secondary | ICD-10-CM | POA: Diagnosis not present

## 2021-08-24 DIAGNOSIS — J3089 Other allergic rhinitis: Secondary | ICD-10-CM | POA: Diagnosis not present

## 2021-08-31 DIAGNOSIS — J3081 Allergic rhinitis due to animal (cat) (dog) hair and dander: Secondary | ICD-10-CM | POA: Diagnosis not present

## 2021-08-31 DIAGNOSIS — J301 Allergic rhinitis due to pollen: Secondary | ICD-10-CM | POA: Diagnosis not present

## 2021-08-31 DIAGNOSIS — J3089 Other allergic rhinitis: Secondary | ICD-10-CM | POA: Diagnosis not present

## 2021-09-09 DIAGNOSIS — J3089 Other allergic rhinitis: Secondary | ICD-10-CM | POA: Diagnosis not present

## 2021-09-09 DIAGNOSIS — J301 Allergic rhinitis due to pollen: Secondary | ICD-10-CM | POA: Diagnosis not present

## 2021-09-09 DIAGNOSIS — R059 Cough, unspecified: Secondary | ICD-10-CM | POA: Diagnosis not present

## 2021-09-09 DIAGNOSIS — H1045 Other chronic allergic conjunctivitis: Secondary | ICD-10-CM | POA: Diagnosis not present

## 2021-09-09 DIAGNOSIS — J3081 Allergic rhinitis due to animal (cat) (dog) hair and dander: Secondary | ICD-10-CM | POA: Diagnosis not present

## 2021-09-14 DIAGNOSIS — J3089 Other allergic rhinitis: Secondary | ICD-10-CM | POA: Diagnosis not present

## 2021-09-14 DIAGNOSIS — J3081 Allergic rhinitis due to animal (cat) (dog) hair and dander: Secondary | ICD-10-CM | POA: Diagnosis not present

## 2021-09-14 DIAGNOSIS — J301 Allergic rhinitis due to pollen: Secondary | ICD-10-CM | POA: Diagnosis not present

## 2021-09-16 DIAGNOSIS — Z20822 Contact with and (suspected) exposure to covid-19: Secondary | ICD-10-CM | POA: Diagnosis not present

## 2021-09-16 DIAGNOSIS — Z131 Encounter for screening for diabetes mellitus: Secondary | ICD-10-CM | POA: Diagnosis not present

## 2021-09-16 DIAGNOSIS — E559 Vitamin D deficiency, unspecified: Secondary | ICD-10-CM | POA: Diagnosis not present

## 2021-09-16 DIAGNOSIS — R5383 Other fatigue: Secondary | ICD-10-CM | POA: Diagnosis not present

## 2021-09-16 DIAGNOSIS — Z79899 Other long term (current) drug therapy: Secondary | ICD-10-CM | POA: Diagnosis not present

## 2021-09-16 DIAGNOSIS — D539 Nutritional anemia, unspecified: Secondary | ICD-10-CM | POA: Diagnosis not present

## 2021-09-16 DIAGNOSIS — R635 Abnormal weight gain: Secondary | ICD-10-CM | POA: Diagnosis not present

## 2021-09-16 DIAGNOSIS — E669 Obesity, unspecified: Secondary | ICD-10-CM | POA: Diagnosis not present

## 2021-09-16 DIAGNOSIS — N1831 Chronic kidney disease, stage 3a: Secondary | ICD-10-CM | POA: Diagnosis not present

## 2021-09-16 DIAGNOSIS — E7800 Pure hypercholesterolemia, unspecified: Secondary | ICD-10-CM | POA: Diagnosis not present

## 2021-09-16 DIAGNOSIS — I10 Essential (primary) hypertension: Secondary | ICD-10-CM | POA: Diagnosis not present

## 2021-09-21 DIAGNOSIS — J301 Allergic rhinitis due to pollen: Secondary | ICD-10-CM | POA: Diagnosis not present

## 2021-09-21 DIAGNOSIS — J3081 Allergic rhinitis due to animal (cat) (dog) hair and dander: Secondary | ICD-10-CM | POA: Diagnosis not present

## 2021-09-21 DIAGNOSIS — J3089 Other allergic rhinitis: Secondary | ICD-10-CM | POA: Diagnosis not present

## 2021-09-23 DIAGNOSIS — N183 Chronic kidney disease, stage 3 unspecified: Secondary | ICD-10-CM | POA: Diagnosis not present

## 2021-09-23 DIAGNOSIS — I129 Hypertensive chronic kidney disease with stage 1 through stage 4 chronic kidney disease, or unspecified chronic kidney disease: Secondary | ICD-10-CM | POA: Diagnosis not present

## 2021-09-23 DIAGNOSIS — N2581 Secondary hyperparathyroidism of renal origin: Secondary | ICD-10-CM | POA: Diagnosis not present

## 2021-09-23 DIAGNOSIS — B20 Human immunodeficiency virus [HIV] disease: Secondary | ICD-10-CM | POA: Diagnosis not present

## 2021-09-28 DIAGNOSIS — J301 Allergic rhinitis due to pollen: Secondary | ICD-10-CM | POA: Diagnosis not present

## 2021-09-28 DIAGNOSIS — J3089 Other allergic rhinitis: Secondary | ICD-10-CM | POA: Diagnosis not present

## 2021-09-28 DIAGNOSIS — J3081 Allergic rhinitis due to animal (cat) (dog) hair and dander: Secondary | ICD-10-CM | POA: Diagnosis not present

## 2021-10-14 DIAGNOSIS — E669 Obesity, unspecified: Secondary | ICD-10-CM | POA: Diagnosis not present

## 2021-10-14 DIAGNOSIS — R635 Abnormal weight gain: Secondary | ICD-10-CM | POA: Diagnosis not present

## 2021-10-14 DIAGNOSIS — N1831 Chronic kidney disease, stage 3a: Secondary | ICD-10-CM | POA: Diagnosis not present

## 2021-10-14 DIAGNOSIS — E7800 Pure hypercholesterolemia, unspecified: Secondary | ICD-10-CM | POA: Diagnosis not present

## 2021-10-14 DIAGNOSIS — I10 Essential (primary) hypertension: Secondary | ICD-10-CM | POA: Diagnosis not present

## 2021-10-19 DIAGNOSIS — J3089 Other allergic rhinitis: Secondary | ICD-10-CM | POA: Diagnosis not present

## 2021-10-19 DIAGNOSIS — J301 Allergic rhinitis due to pollen: Secondary | ICD-10-CM | POA: Diagnosis not present

## 2021-10-19 DIAGNOSIS — J3081 Allergic rhinitis due to animal (cat) (dog) hair and dander: Secondary | ICD-10-CM | POA: Diagnosis not present

## 2021-10-26 DIAGNOSIS — J3089 Other allergic rhinitis: Secondary | ICD-10-CM | POA: Diagnosis not present

## 2021-10-26 DIAGNOSIS — J301 Allergic rhinitis due to pollen: Secondary | ICD-10-CM | POA: Diagnosis not present

## 2021-10-26 DIAGNOSIS — J3081 Allergic rhinitis due to animal (cat) (dog) hair and dander: Secondary | ICD-10-CM | POA: Diagnosis not present

## 2021-11-02 DIAGNOSIS — J3089 Other allergic rhinitis: Secondary | ICD-10-CM | POA: Diagnosis not present

## 2021-11-02 DIAGNOSIS — J301 Allergic rhinitis due to pollen: Secondary | ICD-10-CM | POA: Diagnosis not present

## 2021-11-02 DIAGNOSIS — J3081 Allergic rhinitis due to animal (cat) (dog) hair and dander: Secondary | ICD-10-CM | POA: Diagnosis not present

## 2021-11-09 DIAGNOSIS — J301 Allergic rhinitis due to pollen: Secondary | ICD-10-CM | POA: Diagnosis not present

## 2021-11-09 DIAGNOSIS — J3089 Other allergic rhinitis: Secondary | ICD-10-CM | POA: Diagnosis not present

## 2021-11-09 DIAGNOSIS — J3081 Allergic rhinitis due to animal (cat) (dog) hair and dander: Secondary | ICD-10-CM | POA: Diagnosis not present

## 2021-11-16 DIAGNOSIS — J301 Allergic rhinitis due to pollen: Secondary | ICD-10-CM | POA: Diagnosis not present

## 2021-11-16 DIAGNOSIS — J3089 Other allergic rhinitis: Secondary | ICD-10-CM | POA: Diagnosis not present

## 2021-11-16 DIAGNOSIS — J3081 Allergic rhinitis due to animal (cat) (dog) hair and dander: Secondary | ICD-10-CM | POA: Diagnosis not present

## 2021-11-18 DIAGNOSIS — I10 Essential (primary) hypertension: Secondary | ICD-10-CM | POA: Diagnosis not present

## 2021-11-18 DIAGNOSIS — R635 Abnormal weight gain: Secondary | ICD-10-CM | POA: Diagnosis not present

## 2021-11-18 DIAGNOSIS — E669 Obesity, unspecified: Secondary | ICD-10-CM | POA: Diagnosis not present

## 2021-11-18 DIAGNOSIS — N1831 Chronic kidney disease, stage 3a: Secondary | ICD-10-CM | POA: Diagnosis not present

## 2021-11-18 DIAGNOSIS — E7800 Pure hypercholesterolemia, unspecified: Secondary | ICD-10-CM | POA: Diagnosis not present

## 2021-11-23 DIAGNOSIS — J301 Allergic rhinitis due to pollen: Secondary | ICD-10-CM | POA: Diagnosis not present

## 2021-11-23 DIAGNOSIS — J3081 Allergic rhinitis due to animal (cat) (dog) hair and dander: Secondary | ICD-10-CM | POA: Diagnosis not present

## 2021-11-23 DIAGNOSIS — J3089 Other allergic rhinitis: Secondary | ICD-10-CM | POA: Diagnosis not present

## 2021-12-02 DIAGNOSIS — J3081 Allergic rhinitis due to animal (cat) (dog) hair and dander: Secondary | ICD-10-CM | POA: Diagnosis not present

## 2021-12-02 DIAGNOSIS — J301 Allergic rhinitis due to pollen: Secondary | ICD-10-CM | POA: Diagnosis not present

## 2021-12-02 DIAGNOSIS — J3089 Other allergic rhinitis: Secondary | ICD-10-CM | POA: Diagnosis not present

## 2021-12-09 DIAGNOSIS — J3081 Allergic rhinitis due to animal (cat) (dog) hair and dander: Secondary | ICD-10-CM | POA: Diagnosis not present

## 2021-12-09 DIAGNOSIS — J301 Allergic rhinitis due to pollen: Secondary | ICD-10-CM | POA: Diagnosis not present

## 2021-12-09 DIAGNOSIS — J3089 Other allergic rhinitis: Secondary | ICD-10-CM | POA: Diagnosis not present

## 2021-12-14 ENCOUNTER — Encounter: Payer: Self-pay | Admitting: Infectious Diseases

## 2021-12-16 DIAGNOSIS — J3089 Other allergic rhinitis: Secondary | ICD-10-CM | POA: Diagnosis not present

## 2021-12-16 DIAGNOSIS — J3081 Allergic rhinitis due to animal (cat) (dog) hair and dander: Secondary | ICD-10-CM | POA: Diagnosis not present

## 2021-12-16 DIAGNOSIS — J301 Allergic rhinitis due to pollen: Secondary | ICD-10-CM | POA: Diagnosis not present

## 2021-12-21 DIAGNOSIS — J3089 Other allergic rhinitis: Secondary | ICD-10-CM | POA: Diagnosis not present

## 2021-12-21 DIAGNOSIS — J301 Allergic rhinitis due to pollen: Secondary | ICD-10-CM | POA: Diagnosis not present

## 2021-12-21 DIAGNOSIS — J3081 Allergic rhinitis due to animal (cat) (dog) hair and dander: Secondary | ICD-10-CM | POA: Diagnosis not present

## 2021-12-23 DIAGNOSIS — E7800 Pure hypercholesterolemia, unspecified: Secondary | ICD-10-CM | POA: Diagnosis not present

## 2021-12-23 DIAGNOSIS — I10 Essential (primary) hypertension: Secondary | ICD-10-CM | POA: Diagnosis not present

## 2021-12-23 DIAGNOSIS — Z131 Encounter for screening for diabetes mellitus: Secondary | ICD-10-CM | POA: Diagnosis not present

## 2021-12-23 DIAGNOSIS — B20 Human immunodeficiency virus [HIV] disease: Secondary | ICD-10-CM | POA: Diagnosis not present

## 2021-12-23 DIAGNOSIS — Z79899 Other long term (current) drug therapy: Secondary | ICD-10-CM | POA: Diagnosis not present

## 2021-12-23 DIAGNOSIS — R635 Abnormal weight gain: Secondary | ICD-10-CM | POA: Diagnosis not present

## 2021-12-23 DIAGNOSIS — E663 Overweight: Secondary | ICD-10-CM | POA: Diagnosis not present

## 2021-12-23 DIAGNOSIS — R5383 Other fatigue: Secondary | ICD-10-CM | POA: Diagnosis not present

## 2021-12-23 DIAGNOSIS — N1831 Chronic kidney disease, stage 3a: Secondary | ICD-10-CM | POA: Diagnosis not present

## 2021-12-27 DIAGNOSIS — J301 Allergic rhinitis due to pollen: Secondary | ICD-10-CM | POA: Diagnosis not present

## 2021-12-28 ENCOUNTER — Encounter (HOSPITAL_BASED_OUTPATIENT_CLINIC_OR_DEPARTMENT_OTHER): Payer: Self-pay | Admitting: Urology

## 2021-12-28 ENCOUNTER — Observation Stay (HOSPITAL_BASED_OUTPATIENT_CLINIC_OR_DEPARTMENT_OTHER)
Admission: EM | Admit: 2021-12-28 | Discharge: 2021-12-31 | Disposition: A | Discharge status: Home / Self Care | Payer: BC Managed Care – PPO | Attending: Internal Medicine | Admitting: Internal Medicine

## 2021-12-28 ENCOUNTER — Emergency Department (HOSPITAL_BASED_OUTPATIENT_CLINIC_OR_DEPARTMENT_OTHER): Payer: BC Managed Care – PPO

## 2021-12-28 ENCOUNTER — Other Ambulatory Visit: Payer: Self-pay

## 2021-12-28 DIAGNOSIS — Z79899 Other long term (current) drug therapy: Secondary | ICD-10-CM | POA: Insufficient documentation

## 2021-12-28 DIAGNOSIS — R1110 Vomiting, unspecified: Secondary | ICD-10-CM | POA: Diagnosis not present

## 2021-12-28 DIAGNOSIS — B20 Human immunodeficiency virus [HIV] disease: Secondary | ICD-10-CM | POA: Diagnosis not present

## 2021-12-28 DIAGNOSIS — R0789 Other chest pain: Secondary | ICD-10-CM | POA: Diagnosis not present

## 2021-12-28 DIAGNOSIS — K8010 Calculus of gallbladder with chronic cholecystitis without obstruction: Secondary | ICD-10-CM | POA: Diagnosis not present

## 2021-12-28 DIAGNOSIS — K851 Biliary acute pancreatitis without necrosis or infection: Secondary | ICD-10-CM | POA: Insufficient documentation

## 2021-12-28 DIAGNOSIS — J309 Allergic rhinitis, unspecified: Secondary | ICD-10-CM | POA: Diagnosis present

## 2021-12-28 DIAGNOSIS — N1831 Chronic kidney disease, stage 3a: Secondary | ICD-10-CM | POA: Insufficient documentation

## 2021-12-28 DIAGNOSIS — J3089 Other allergic rhinitis: Secondary | ICD-10-CM | POA: Diagnosis not present

## 2021-12-28 DIAGNOSIS — E871 Hypo-osmolality and hyponatremia: Secondary | ICD-10-CM | POA: Insufficient documentation

## 2021-12-28 DIAGNOSIS — N183 Chronic kidney disease, stage 3 unspecified: Secondary | ICD-10-CM | POA: Insufficient documentation

## 2021-12-28 DIAGNOSIS — K8020 Calculus of gallbladder without cholecystitis without obstruction: Secondary | ICD-10-CM

## 2021-12-28 DIAGNOSIS — J3081 Allergic rhinitis due to animal (cat) (dog) hair and dander: Secondary | ICD-10-CM | POA: Diagnosis not present

## 2021-12-28 DIAGNOSIS — S2241XA Multiple fractures of ribs, right side, initial encounter for closed fracture: Secondary | ICD-10-CM | POA: Diagnosis not present

## 2021-12-28 DIAGNOSIS — I129 Hypertensive chronic kidney disease with stage 1 through stage 4 chronic kidney disease, or unspecified chronic kidney disease: Secondary | ICD-10-CM | POA: Diagnosis not present

## 2021-12-28 DIAGNOSIS — K824 Cholesterolosis of gallbladder: Secondary | ICD-10-CM | POA: Diagnosis not present

## 2021-12-28 DIAGNOSIS — R109 Unspecified abdominal pain: Secondary | ICD-10-CM | POA: Diagnosis not present

## 2021-12-28 DIAGNOSIS — I10 Essential (primary) hypertension: Secondary | ICD-10-CM | POA: Diagnosis present

## 2021-12-28 DIAGNOSIS — K859 Acute pancreatitis without necrosis or infection, unspecified: Secondary | ICD-10-CM | POA: Diagnosis not present

## 2021-12-28 LAB — CBC WITH DIFFERENTIAL/PLATELET
Abs Immature Granulocytes: 0.02 10*3/uL (ref 0.00–0.07)
Basophils Absolute: 0 10*3/uL (ref 0.0–0.1)
Basophils Relative: 0 %
Eosinophils Absolute: 0 10*3/uL (ref 0.0–0.5)
Eosinophils Relative: 0 %
HCT: 34.9 % — ABNORMAL LOW (ref 39.0–52.0)
Hemoglobin: 12.2 g/dL — ABNORMAL LOW (ref 13.0–17.0)
Immature Granulocytes: 0 %
Lymphocytes Relative: 20 %
Lymphs Abs: 1.3 10*3/uL (ref 0.7–4.0)
MCH: 30 pg (ref 26.0–34.0)
MCHC: 35 g/dL (ref 30.0–36.0)
MCV: 85.7 fL (ref 80.0–100.0)
Monocytes Absolute: 0.5 10*3/uL (ref 0.1–1.0)
Monocytes Relative: 7 %
Neutro Abs: 5 10*3/uL (ref 1.7–7.7)
Neutrophils Relative %: 73 %
Platelets: 254 10*3/uL (ref 150–400)
RBC: 4.07 MIL/uL — ABNORMAL LOW (ref 4.22–5.81)
RDW: 13.6 % (ref 11.5–15.5)
WBC: 6.8 10*3/uL (ref 4.0–10.5)
nRBC: 0 % (ref 0.0–0.2)

## 2021-12-28 LAB — COMPREHENSIVE METABOLIC PANEL
ALT: 62 U/L — ABNORMAL HIGH (ref 0–44)
AST: 71 U/L — ABNORMAL HIGH (ref 15–41)
Albumin: 2.3 g/dL — ABNORMAL LOW (ref 3.5–5.0)
Alkaline Phosphatase: 251 U/L — ABNORMAL HIGH (ref 38–126)
Anion gap: 7 (ref 5–15)
BUN: 15 mg/dL (ref 6–20)
CO2: 17 mmol/L — ABNORMAL LOW (ref 22–32)
Calcium: 8.5 mg/dL — ABNORMAL LOW (ref 8.9–10.3)
Chloride: 106 mmol/L (ref 98–111)
Creatinine, Ser: 1.67 mg/dL — ABNORMAL HIGH (ref 0.61–1.24)
GFR, Estimated: 53 mL/min — ABNORMAL LOW (ref >60)
Glucose, Bld: 91 mg/dL (ref 70–99)
Potassium: 3.7 mmol/L (ref 3.5–5.1)
Sodium: 130 mmol/L — ABNORMAL LOW (ref 135–145)
Total Bilirubin: 1.3 mg/dL — ABNORMAL HIGH (ref 0.3–1.2)
Total Protein: 10.7 g/dL — ABNORMAL HIGH (ref 6.5–8.1)

## 2021-12-28 LAB — LIPASE, BLOOD: Lipase: 54 U/L — ABNORMAL HIGH (ref 11–51)

## 2021-12-28 LAB — TROPONIN I (HIGH SENSITIVITY): Troponin I (High Sensitivity): 4 ng/L (ref <18)

## 2021-12-28 LAB — D-DIMER, QUANTITATIVE: D-Dimer, Quant: 0.48 ug/mL-FEU (ref 0.00–0.50)

## 2021-12-28 MED ORDER — PANTOPRAZOLE SODIUM 40 MG IV SOLR
40.0000 mg | Freq: Once | INTRAVENOUS | Status: AC
  Administered 2021-12-28: 40 mg via INTRAVENOUS
  Filled 2021-12-28: qty 10

## 2021-12-28 MED ORDER — ONDANSETRON HCL 4 MG/2ML IJ SOLN
4.0000 mg | Freq: Once | INTRAMUSCULAR | Status: AC
  Administered 2021-12-28: 4 mg via INTRAVENOUS
  Filled 2021-12-28: qty 2

## 2021-12-28 MED ORDER — MORPHINE SULFATE (PF) 4 MG/ML IV SOLN
4.0000 mg | Freq: Once | INTRAVENOUS | Status: AC
  Administered 2021-12-28: 4 mg via INTRAVENOUS
  Filled 2021-12-28: qty 1

## 2021-12-28 NOTE — ED Triage Notes (Addendum)
Abdominal pain since beginning of may, seen at PCP and placed on antibiotic  N/V with taking the antibiotics (bactrim) x past 5 days  Right sided chest wall pain with deep breath x 2 days

## 2021-12-28 NOTE — ED Provider Notes (Signed)
[place] EMERGENCY DEPARTMENT Provider Note   CSN: [id number] Arrival date & time: 12/28/21  1843     History  Chief Complaint  Patient presents with   Abdominal Pain    Lee [name] is a 41 y.o. male.  HPI     41 year old male comes in with chief complaint of abdominal pain.  Patient indicates that his been having abdominal pain over the last few weeks.  The pain has become more constant over the last 3 days.  He was seen by his PCP few days back, they started him on Bactrim.  In addition, patient is also having right-sided chest pain that is worse with deep inspiration.  He denies any associated cough.  Pt has no hx of PE, DVT and denies any exogenous hormone (testosterone / estrogen) use, long distance travels or surgery in the past 6 weeks, active cancer, recent immobilization.    Home Medications Prior to Admission medications   Medication Sig Start Date End Date Taking? Authorizing Provider  albuterol (PROVENTIL HFA;VENTOLIN HFA) 108 (90 Base) MCG/ACT inhaler Inhale 2 puffs into the lungs every 6 (six) hours as needed for wheezing or shortness of breath. 09/14/17   Nafziger, Tommi Rumps, NP  amLODipine (NORVASC) 10 MG tablet Take 1 tablet (10 mg total) by mouth daily. 01/12/21   Campbell Riches, MD  azelastine (ASTELIN) 0.1 % nasal spray 1-2 puffs in each nostril twice daily    Mosetta Anis, MD  [name] 50-200-25 MG TABS tablet TAKE 1 TABLET DAILY 07/04/21   Campbell Riches, MD  EPINEPHrine 0.3 mg/0.3 mL IJ SOAJ injection Inject 0.3 mg into the muscle once.    Mosetta Anis, MD  losartan (COZAAR) 100 MG tablet Take 1 tablet (100 mg total) by mouth daily. 01/12/21   Campbell Riches, MD  OZEMPIC, 1 MG/DOSE, 4 MG/3ML SOPN Inject 1 mg into the skin once a week. 02/25/20   [provider]  phentermine (ADIPEX-P) 37.5 MG tablet Take 37.5-56.25 mg by mouth daily. 06/26/19   [provider]  predniSONE (STERAPRED UNI-PAK 21 TAB) 10 MG (21)  TBPK tablet Take by mouth daily. Take 6 tabs by mouth daily  for 2 days, then 5 tabs for 2 days, then 4 tabs for 2 days, then 3 tabs for 2 days, 2 tabs for 2 days, then 1 tab by mouth daily for 2 days 07/14/21   Veryl Speak, MD  sodium chloride (OCEAN) 0.65 % SOLN nasal spray Place 1 spray into both nostrils as needed for congestion. 09/12/17   Debbe Odea, MD  topiramate (TOPAMAX) 100 MG tablet Take 100 mg by mouth at bedtime. 06/06/19   [provider]  Vitamin D, Ergocalciferol, (DRISDOL) 1.25 MG (50000 UT) CAPS capsule Take 50,000 Units by mouth once a week. Patient not taking: Reported on 09/09/2020 06/06/19   [provider]      Allergies    Lisinopril and Nickel    Review of Systems   Review of Systems  All other systems reviewed and are negative.  Physical Exam Updated Vital Signs BP 122/81   Pulse 85   Temp 98.6 F (37 C) (Oral)   Resp 20   Ht '6\' 4"'  (1.93 m)   Wt 102.5 kg   SpO2 98%   BMI 27.51 kg/m  Physical Exam Vitals and nursing note reviewed.  Constitutional:      Appearance: He is well-developed.  HENT:     Head: Atraumatic.  Cardiovascular:     Rate  and Rhythm: Normal rate.  Pulmonary:     Effort: Pulmonary effort is normal.  Abdominal:     Tenderness: There is abdominal tenderness in the right upper quadrant and epigastric area. There is guarding. There is no rebound. Negative signs include Murphy's sign and McBurney's sign.  Musculoskeletal:     Cervical back: Neck supple.  Skin:    General: Skin is warm.  Neurological:     Mental Status: He is alert and oriented to person, place, and time.    ED Results / Procedures / Treatments   Labs (all labs ordered are listed, but only abnormal results are displayed) Labs Reviewed  COMPREHENSIVE METABOLIC PANEL - Abnormal; Notable for the following components:      Result Value   Sodium 130 (*)    CO2 17 (*)    Creatinine, Ser 1.67 (*)    Calcium 8.5 (*)    Total Protein 10.7 (*)     Albumin 2.3 (*)    AST 71 (*)    ALT 62 (*)    Alkaline Phosphatase 251 (*)    Total Bilirubin 1.3 (*)    GFR, Estimated 53 (*)    All other components within normal limits  CBC WITH DIFFERENTIAL/PLATELET - Abnormal; Notable for the following components:   RBC 4.07 (*)    Hemoglobin 12.2 (*)    HCT 34.9 (*)    All other components within normal limits  LIPASE, BLOOD - Abnormal; Notable for the following components:   Lipase 54 (*)    All other components within normal limits  D-DIMER, QUANTITATIVE  TROPONIN I (HIGH SENSITIVITY)  TROPONIN I (HIGH SENSITIVITY)    EKG None  Radiology DG Ribs Unilateral W/Chest Right  Result Date: 12/28/2021 CLINICAL DATA:  right rib pain with vomiting EXAM: RIGHT RIBS AND CHEST - 3+ VIEW COMPARISON:  Chest x-ray 07/14/2021 FINDINGS: The heart and mediastinal contours are within normal limits. No focal consolidation. No pulmonary edema. No pleural effusion. No pneumothorax. Nipple markers noted bilaterally. No acute displaced fracture or other bone lesions are seen involving the ribs. IMPRESSION: 1. No acute displaced right rib fractures. Please note, nondisplaced rib fractures may be occult on radiograph. 2. No acute cardiopulmonary abnormality. Electronically Signed   By: Iven Finn M.D.   On: 12/28/2021 19:12   US Abdomen Limited RUQ (LIVER/GB)  Result Date: 12/28/2021 CLINICAL DATA:  Pancreatitis. EXAM: ULTRASOUND ABDOMEN LIMITED RIGHT UPPER QUADRANT COMPARISON:  Abdominal ultrasound 02/23/2017. CT abdomen and pelvis 04/06/2020 FINDINGS: Gallbladder: Small gallstones are present. Gallbladder polyp is present measuring 4 mm. No gallbladder wall thickening or pericholecystic fluid. No sonographic Murphy sign noted by sonographer. Common bile duct: Diameter: 4.1 mm Liver: No focal lesion identified. Within normal limits in parenchymal echogenicity. Portal vein is patent on color Doppler imaging with normal direction of blood flow towards the liver.  Other: None. IMPRESSION: 1. Cholelithiasis. No additional sonographic evidence for acute cholecystitis. 2. 4 mm gallbladder polyp.  No follow-up necessary. Electronically Signed   By: Ronney Asters M.D.   On: 12/28/2021 22:42    Procedures Procedures    Medications Ordered in ED Medications  pantoprazole (PROTONIX) injection 40 mg (40 mg Intravenous Given 12/28/21 2118)  morphine (PF) 4 MG/ML injection 4 mg (4 mg Intravenous Given 12/28/21 2116)  ondansetron (ZOFRAN) injection 4 mg (4 mg Intravenous Given 12/28/21 2116)    ED Course/ Medical Decision Making/ A&P  Medical Decision Making Amount and/or Complexity of Data Reviewed Labs: ordered. Radiology: ordered.  Risk Prescription drug management. Decision regarding hospitalization.   This patient presents to the ED with chief complaint(s) of abdominal pain and also right-sided chest pain with pertinent past medical history of HIV, recent antibiotic prescription for abdominal pain which further complicates the presenting complaint. The complaint involves an extensive differential diagnosis and also carries with it a high risk of complications and morbidity.    The differential diagnosis includes  DDx includes: Pancreatitis Hepatobiliary pathology including cholecystitis Gastritis/PUD SBO ACS syndrome Pulmonary embolism  The initial plan is to order basic labs, D-dimer, x-ray of the chest. We will also give him something for pain.   Additional history obtained: Records reviewed previous admission documents and Primary Care Documents  Independent labs interpretation:  The following labs were independently interpreted: Elevated alk phos, mildly elevated AST, ALT and lipase. Patient D-dimer is 0.48.  On Wells score, patient is low risk for PE.  No further CT angiogram indicated at this time.  High-sensitivity troponin into 1 is normal. Electrolytes are reassuring besides slightly elevated creatinine and  low bicarbonate level.  Possibly some dehydration.   Independent visualization of imaging: - I independently visualized the following imaging with scope of interpretation limited to determining acute life threatening conditions related to emergency care: X-ray of the chest, which revealed no evidence of pneumothorax or pneumonia  Treatment and Reassessment: Patient reassessed after labs.  Patient continues to have some abdominal tenderness.  Negative Murphy sign.  We will proceed with right upper quadrant ultrasound. If the ultrasound is negative, then we will need CT scan.  Reassessment: Ultrasound positive for gallstones.  I have reviewed patient's CT scan from 2022, at that time his hepatobiliary and pancreas evaluation was normal.  Suspect that patient is having symptomatic cholelithiasis.  On repeat exam, he still has reproducible tenderness with palpation of the right upper quadrant.  We will proceed with admission.  General surgery has been consulted via secure chat message in the setting of patient not having acute cholecystitis or acute cholangitis.  Medicine to admit.  Final Clinical Impression(s) / ED Diagnoses Final diagnoses:  Acute gallstone pancreatitis    Rx / DC Orders ED Discharge Orders     None         Varney Biles, MD 12/28/21 2349

## 2021-12-29 ENCOUNTER — Other Ambulatory Visit: Payer: Self-pay

## 2021-12-29 DIAGNOSIS — K851 Biliary acute pancreatitis without necrosis or infection: Secondary | ICD-10-CM | POA: Diagnosis not present

## 2021-12-29 DIAGNOSIS — K8010 Calculus of gallbladder with chronic cholecystitis without obstruction: Secondary | ICD-10-CM | POA: Diagnosis not present

## 2021-12-29 DIAGNOSIS — I129 Hypertensive chronic kidney disease with stage 1 through stage 4 chronic kidney disease, or unspecified chronic kidney disease: Secondary | ICD-10-CM | POA: Diagnosis not present

## 2021-12-29 DIAGNOSIS — I10 Essential (primary) hypertension: Secondary | ICD-10-CM | POA: Diagnosis not present

## 2021-12-29 DIAGNOSIS — K8020 Calculus of gallbladder without cholecystitis without obstruction: Secondary | ICD-10-CM | POA: Diagnosis not present

## 2021-12-29 DIAGNOSIS — N1831 Chronic kidney disease, stage 3a: Secondary | ICD-10-CM

## 2021-12-29 DIAGNOSIS — Z79899 Other long term (current) drug therapy: Secondary | ICD-10-CM | POA: Diagnosis not present

## 2021-12-29 DIAGNOSIS — R109 Unspecified abdominal pain: Secondary | ICD-10-CM | POA: Diagnosis not present

## 2021-12-29 DIAGNOSIS — J309 Allergic rhinitis, unspecified: Secondary | ICD-10-CM

## 2021-12-29 DIAGNOSIS — E871 Hypo-osmolality and hyponatremia: Secondary | ICD-10-CM

## 2021-12-29 DIAGNOSIS — H7093 Unspecified mastoiditis, bilateral: Secondary | ICD-10-CM | POA: Diagnosis not present

## 2021-12-29 DIAGNOSIS — B20 Human immunodeficiency virus [HIV] disease: Secondary | ICD-10-CM | POA: Diagnosis not present

## 2021-12-29 LAB — SURGICAL PCR SCREEN
MRSA, PCR: NEGATIVE
Staphylococcus aureus: NEGATIVE

## 2021-12-29 LAB — COMPREHENSIVE METABOLIC PANEL
ALT: 62 U/L — ABNORMAL HIGH (ref 0–44)
AST: 66 U/L — ABNORMAL HIGH (ref 15–41)
Albumin: 2.2 g/dL — ABNORMAL LOW (ref 3.5–5.0)
Alkaline Phosphatase: 250 U/L — ABNORMAL HIGH (ref 38–126)
Anion gap: 6 (ref 5–15)
BUN: 17 mg/dL (ref 6–20)
CO2: 18 mmol/L — ABNORMAL LOW (ref 22–32)
Calcium: 8.4 mg/dL — ABNORMAL LOW (ref 8.9–10.3)
Chloride: 109 mmol/L (ref 98–111)
Creatinine, Ser: 1.43 mg/dL — ABNORMAL HIGH (ref 0.61–1.24)
GFR, Estimated: >60 mL/min (ref >60)
Glucose, Bld: 79 mg/dL (ref 70–99)
Potassium: 4.2 mmol/L (ref 3.5–5.1)
Sodium: 133 mmol/L — ABNORMAL LOW (ref 135–145)
Total Bilirubin: 1.6 mg/dL — ABNORMAL HIGH (ref 0.3–1.2)
Total Protein: 10.2 g/dL — ABNORMAL HIGH (ref 6.5–8.1)

## 2021-12-29 MED ORDER — BICTEGRAVIR-EMTRICITAB-TENOFOV 50-200-25 MG PO TABS
1.0000 | ORAL_TABLET | Freq: Every day | ORAL | Status: DC
  Administered 2021-12-29: 1 via ORAL
  Filled 2021-12-29 (×2): qty 1

## 2021-12-29 MED ORDER — ACETAMINOPHEN 325 MG PO TABS: 650.0000 mg | ORAL_TABLET | Freq: Four times a day (QID) | ORAL | Status: DC | PRN

## 2021-12-29 MED ORDER — ONDANSETRON HCL 4 MG PO TABS: 4.0000 mg | ORAL_TABLET | Freq: Four times a day (QID) | ORAL | Status: DC | PRN

## 2021-12-29 MED ORDER — ONDANSETRON HCL 4 MG/2ML IJ SOLN
4.0000 mg | Freq: Four times a day (QID) | INTRAMUSCULAR | Status: DC | PRN
  Administered 2021-12-29 (×3): 4 mg via INTRAVENOUS
  Filled 2021-12-29 (×3): qty 2

## 2021-12-29 MED ORDER — ACETAMINOPHEN 650 MG RE SUPP: 650.0000 mg | Freq: Four times a day (QID) | RECTAL | Status: DC | PRN

## 2021-12-29 MED ORDER — MONTELUKAST SODIUM 10 MG PO TABS
10.0000 mg | ORAL_TABLET | Freq: Every day | ORAL | Status: DC
  Administered 2021-12-29: 10 mg via ORAL
  Filled 2021-12-29: qty 1

## 2021-12-29 MED ORDER — ALBUTEROL SULFATE HFA 108 (90 BASE) MCG/ACT IN AERS
2.0000 | INHALATION_SPRAY | Freq: Four times a day (QID) | RESPIRATORY_TRACT | Status: DC | PRN
Start: 2021-12-29 — End: 2021-12-29

## 2021-12-29 MED ORDER — ALBUTEROL SULFATE (2.5 MG/3ML) 0.083% IN NEBU: 2.5000 mg | INHALATION_SOLUTION | Freq: Four times a day (QID) | RESPIRATORY_TRACT | Status: DC | PRN

## 2021-12-29 MED ORDER — AZELASTINE HCL 0.1 % NA SOLN
1.0000 | Freq: Two times a day (BID) | NASAL | Status: DC
  Administered 2021-12-29 (×2): 1 via NASAL
  Filled 2021-12-29: qty 30

## 2021-12-29 MED ORDER — TOPIRAMATE 100 MG PO TABS
100.0000 mg | ORAL_TABLET | Freq: Every day | ORAL | Status: DC
  Administered 2021-12-29: 100 mg via ORAL
  Filled 2021-12-29: qty 1

## 2021-12-29 MED ORDER — POTASSIUM CHLORIDE IN NACL 20-0.9 MEQ/L-% IV SOLN
INTRAVENOUS | Status: AC
  Filled 2021-12-29 (×2): qty 1000

## 2021-12-29 MED ORDER — HYDROMORPHONE HCL 1 MG/ML IJ SOLN
0.5000 mg | INTRAMUSCULAR | Status: DC | PRN
  Administered 2021-12-29 – 2021-12-30 (×2): 0.5 mg via INTRAVENOUS
  Filled 2021-12-29 (×2): qty 0.5

## 2021-12-29 MED ORDER — AMLODIPINE BESYLATE 10 MG PO TABS
10.0000 mg | ORAL_TABLET | Freq: Every day | ORAL | Status: DC
Start: 2021-12-29 — End: 2021-12-30
  Administered 2021-12-29: 10 mg via ORAL
  Filled 2021-12-29: qty 1

## 2021-12-29 MED ORDER — OXYCODONE HCL 5 MG PO TABS
5.0000 mg | ORAL_TABLET | ORAL | Status: DC | PRN
  Administered 2021-12-29 (×2): 5 mg via ORAL
  Filled 2021-12-29 (×2): qty 1

## 2021-12-29 MED ORDER — HEPARIN SODIUM (PORCINE) 5000 UNIT/ML IJ SOLN
5000.0000 [IU] | Freq: Three times a day (TID) | INTRAMUSCULAR | Status: DC
  Administered 2021-12-29 (×3): 5000 [IU] via SUBCUTANEOUS
  Filled 2021-12-29 (×3): qty 1

## 2021-12-29 MED ORDER — SODIUM CHLORIDE 0.9 % IV SOLN
2.0000 g | INTRAVENOUS | Status: DC
  Administered 2021-12-29: 2 g via INTRAVENOUS
  Filled 2021-12-29: qty 20

## 2021-12-29 NOTE — H&P (Signed)
History and Physical    Lee [name] UEA:[id number] DOB: May 06, 1981 DOA: 12/28/2021  DOS: the patient was seen and examined on 12/28/2021  PCP: [name], NP   Patient coming from: Home  I have personally briefly reviewed patient's old medical records in [place]  Chief complaint: Nausea and vomiting History of present illness 41 year old African-American male history of HIV disease on [name], history of allergic rhinitis who presents to the ER today with a 1 month history of generalized abdominal discomfort, to 3 days of nausea and vomiting.  Patient states that last week he noticed that his nocturia had gotten significantly worse.  He states that he has a very dry mouth due to all the allergy medicines he takes.  He drinks about 36 to 48 ounces of water before he goes to bed every night and normally urinates 2-3 times a night.  Over the last weekend, he noted that he started to urinate maybe 10-12 times a night.  He called his primary care doctor and saw them and patient was started on Bactrim due to concern for possible UTI.  Since starting Bactrim, he is felt increasingly nauseated.  He has vomited several times.  Yesterday started having right upper quadrant abdominal pain.  Pain got so severe he went to the ER for evaluation. Patient has noticed that over the last month of May, when he eats greasy or heavy foods, he feels some abdominal bloating, generalized abdominal discomfort.  He denies any diarrhea.  Over the last couple days with his vomiting, he noticed that his emesis has been yellow.  On admission the ER temperature 98.6 heart rate 110 blood pressure 135/81  Labs showed a sodium 130, bicarb of 17, BUN of 15, creatinine 0.67  AST of 71, ALT is 62, alk phos of 251, total bili 1.3  Lipase mildly elevated at 54  White count 8.6, hemoglobin 12.2, platelets 254  Right upper quadrant ultrasound was negative for sonographic Murphy sign.  There is some small  gallstones present.  There is small gallbladder polyp measuring 4 mm.  There is no gallbladder wall thickening or pericholecystic fluid.  Patient transferred to [facility] for further evaluation and admission.   ED Course: RUQ shows gallstones but no gallbladder wall thickening or pericholecystic fluid. Negative sonographic murphy's sign.  Review of Systems:  Review of Systems  Constitutional:  Positive for malaise/fatigue. Negative for chills and fever.  HENT: Negative.    Eyes: Negative.   Respiratory: Negative.    Cardiovascular:        RUQ pain below right costal margin  Gastrointestinal:  Positive for abdominal pain, nausea and vomiting.  Genitourinary:  Positive for frequency.  Musculoskeletal: Negative.   Skin: Negative.   Neurological: Negative.   Endo/Heme/Allergies: Negative.   Psychiatric/Behavioral: Negative.    All other systems reviewed and are negative.   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 He has never used smokeless tobacco. He reports that he does not drink alcohol and does not use drugs.  Allergies  Allergen Reactions   Lisinopril Other (See Comments)    Dry cough     Nickel Rash    Family History  Problem Relation Age of Onset   Hypercalcemia Mother    Hypertension Mother    Hypertension Father     Prior to Admission medications   Medication Sig Start Date End Date  Taking? Authorizing Provider  albuterol (PROVENTIL HFA;VENTOLIN HFA) 108 (90 Base) MCG/ACT inhaler Inhale 2 puffs into the lungs every 6 (six) hours as needed for wheezing or shortness of breath. 09/14/17   Nafziger, Tommi Rumps, NP  amLODipine (NORVASC) 10 MG tablet Take 1 tablet (10 mg total) by mouth daily. 01/12/21   Campbell Riches, MD  azelastine (ASTELIN) 0.1 % nasal spray 1-2 puffs in each nostril twice daily    Mosetta Anis, MD  [name] 50-200-25 MG TABS tablet TAKE 1 TABLET DAILY 07/04/21   Campbell Riches, MD  EPINEPHrine 0.3 mg/0.3 mL IJ SOAJ injection Inject 0.3 mg into the muscle once.    Mosetta Anis, MD  losartan (COZAAR) 100 MG tablet Take 1 tablet (100 mg total) by mouth daily. 01/12/21   Campbell Riches, MD  OZEMPIC, 1 MG/DOSE, 4 MG/3ML SOPN Inject 1 mg into the skin once a week. 02/25/20   [provider]  phentermine (ADIPEX-P) 37.5 MG tablet Take 37.5-56.25 mg by mouth daily. 06/26/19   [provider]  predniSONE (STERAPRED UNI-PAK 21 TAB) 10 MG (21) TBPK tablet Take by mouth daily. Take 6 tabs by mouth daily  for 2 days, then 5 tabs for 2 days, then 4 tabs for 2 days, then 3 tabs for 2 days, 2 tabs for 2 days, then 1 tab by mouth daily for 2 days 07/14/21   Veryl Speak, MD  sodium chloride (OCEAN) 0.65 % SOLN nasal spray Place 1 spray into both nostrils as needed for congestion. 09/12/17   Debbe Odea, MD  topiramate (TOPAMAX) 100 MG tablet Take 100 mg by mouth at bedtime. 06/06/19   [provider]  Vitamin D, Ergocalciferol, (DRISDOL) 1.25 MG (50000 UT) CAPS capsule Take 50,000 Units by mouth once a week. Patient not taking: Reported on 09/09/2020 06/06/19   [provider]    Physical Exam: Vitals:   12/28/21 2045 12/28/21 2200 12/28/21 2245 12/29/21 0147  BP: 133/83 121/82 122/81 121/75  Pulse: 95 97 85 82  Resp: _0 Temp:    98.9 F (37.2 C)  TempSrc:    Oral  SpO2: 98% 99% 98% 98%  Weight:      Height:         Physical Exam Vitals and nursing note reviewed.  Constitutional:      General: He is not in acute distress.    Appearance: Normal appearance. He is normal weight. He is not ill-appearing, toxic-appearing or diaphoretic.  HENT:     Head: Normocephalic and atraumatic.     Nose: Nose normal.  Eyes:     General: No scleral icterus. Cardiovascular:     Rate and Rhythm: Normal rate and regular rhythm.     Pulses: Normal pulses.     Heart sounds: Normal heart sounds.  Pulmonary:     Effort: Pulmonary effort is normal. No respiratory distress.     Breath sounds: Normal breath sounds. No wheezing or rales.  Abdominal:  General: Abdomen is flat. Bowel sounds are normal. There is no distension.     Palpations: Abdomen is soft.     Tenderness: There is abdominal tenderness in the right upper quadrant. There is no guarding or rebound.  Musculoskeletal:     Right lower leg: No edema.     Left lower leg: No edema.  Skin:    General: Skin is warm and dry.     Capillary Refill: Capillary refill takes less than 2 seconds.  Neurological:     General: No focal deficit present.     Mental Status: He is alert and oriented to person, place, and time.      Labs on Admission: I have personally reviewed following labs and imaging studies  CBC: Recent Labs  Lab 12/28/21 2107  WBC 6.8  NEUTROABS 5.0  HGB 12.2*  HCT 34.9*  MCV 85.7  PLT 628   Basic Metabolic Panel: Recent Labs  Lab 12/28/21 2107  NA 130*  K 3.7  CL 106  CO2 17*  GLUCOSE 91  BUN 15  CREATININE 1.67*  CALCIUM 8.5*   GFR: Estimated Creatinine Clearance: 73.6 mL/min (A) (by C-G formula based on SCr of 1.67 mg/dL (H)). Liver Function Tests: Recent Labs  Lab 12/28/21 2107  AST 71*  ALT 62*  ALKPHOS 251*  BILITOT 1.3*  PROT 10.7*  ALBUMIN 2.3*   Recent Labs  Lab 12/28/21 2107  LIPASE 54*   No results for input(s): "AMMONIA" in the last 168 hours. Coagulation Profile: No results for input(s): "INR",  "PROTIME" in the last 168 hours. Cardiac Enzymes: Recent Labs  Lab 12/28/21 2107  TROPONINIHS 4   BNP (last 3 results) No results for input(s): "PROBNP" in the last 8760 hours. HbA1C: No results for input(s): "HGBA1C" in the last 72 hours. CBG: No results for input(s): "GLUCAP" in the last 168 hours. Lipid Profile: No results for input(s): "CHOL", "HDL", "LDLCALC", "TRIG", "CHOLHDL", "LDLDIRECT" in the last 72 hours. Thyroid Function Tests: No results for input(s): "TSH", "T4TOTAL", "FREET4", "T3FREE", "THYROIDAB" in the last 72 hours. Anemia Panel: No results for input(s): "VITAMINB12", "FOLATE", "FERRITIN", "TIBC", "IRON", "RETICCTPCT" in the last 72 hours. Urine analysis:    Component Value Date/Time   COLORURINE YELLOW 03/12/2020 1234   APPEARANCEUR CLEAR 03/12/2020 1234   LABSPEC 1.015 03/12/2020 1234   PHURINE 7.0 03/12/2020 1234   GLUCOSEU NEGATIVE 03/12/2020 1234   HGBUR MODERATE (A) 03/12/2020 1234   BILIRUBINUR NEGATIVE 03/12/2020 1234   KETONESUR NEGATIVE 03/12/2020 1234   PROTEINUR 30 (A) 03/12/2020 1234   UROBILINOGEN 0.2 12/12/2009 1811   NITRITE NEGATIVE 03/12/2020 1234   LEUKOCYTESUR NEGATIVE 03/12/2020 1234    Radiological Exams on Admission: I have personally reviewed images US Abdomen Limited RUQ (LIVER/GB)  Result Date: 12/28/2021 CLINICAL DATA:  Pancreatitis. EXAM: ULTRASOUND ABDOMEN LIMITED RIGHT UPPER QUADRANT COMPARISON:  Abdominal ultrasound 02/23/2017. CT abdomen and pelvis 04/06/2020 FINDINGS: Gallbladder: Small gallstones are present. Gallbladder polyp is present measuring 4 mm. No gallbladder wall thickening or pericholecystic fluid. No sonographic Murphy sign noted by sonographer. Common bile duct: Diameter: 4.1 mm Liver: No focal lesion identified. Within normal limits in parenchymal echogenicity. Portal vein is patent on color Doppler imaging with normal direction of blood flow towards the liver. Other: None. IMPRESSION: 1. Cholelithiasis. No  additional sonographic evidence for acute cholecystitis. 2. 4 mm gallbladder polyp.  No follow-up necessary. Electronically Signed   By: Ronney Asters M.D.   On: 12/28/2021 22:42   DG Ribs Unilateral W/Chest Right  Result Date: 12/28/2021 CLINICAL DATA:  right rib pain with vomiting EXAM: RIGHT RIBS AND CHEST - 3+ VIEW COMPARISON:  Chest x-ray 07/14/2021 FINDINGS: The heart and mediastinal contours are within normal limits. No focal consolidation. No pulmonary edema. No pleural effusion. No pneumothorax. Nipple markers noted bilaterally. No acute displaced fracture or other bone lesions are seen involving the ribs. IMPRESSION: 1. No acute displaced right rib fractures. Please note, nondisplaced rib fractures may be occult on radiograph. 2. No acute cardiopulmonary abnormality. Electronically Signed   By: Iven Finn M.D.   On: 12/28/2021 19:12    EKG: My personal interpretation of EKG shows: NSR    Assessment/Plan Principal Problem:   Gallbladder colic Active Problems:   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HCC)   Allergic rhinitis   Essential hypertension   Stage 3a chronic kidney disease (CKD) (HCC) - baseline SCr 1.29-1.67   Hyponatremia    Assessment and Plan: * Gallbladder colic Observation medical bed admission.  Keep the patient NPO.  As needed oxycodone 5 mg for moderate pain and 0.5 mg IV Dilaudid for severe pain.  General surgery to see the patient in consult.  Based on his LFTs, he probably passed a small gallstone.  His abdomen is nonacute.  I do not think he has an infected gallbladder.  Start IV fluids was NPO.  General surgery discussed the patient surgical options with him.  Subcu heparin for DVT prophylaxis.  Hyponatremia Likely due to recent vomiting.  Could be due to Bactrim that he was taking.  Continue normal saline.  Repeat CMP at 9 AM. Check TSH.  Stage 3a chronic kidney disease (CKD) (HCC) - baseline SCr 1.29-1.67 Stable.  Essential hypertension Stable.   Continue Norvasc.  5 mg daily.  Allergic rhinitis Stable.  Continue allergy medicines.  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) Stable.  Continue [name].  Patient follows with infectious disease clinic.  He states his viral load is undetectable.   DVT prophylaxis: SQ Heparin Code Status: Full Code Family Communication: no family at bedside  Disposition Plan: return home  Consults called: EDP has consulted general surgery  Admission status: Observation, Med-Surg   Kristopher Oppenheim, DO [facility] 12/29/2021, 2:54 AM

## 2021-12-29 NOTE — Anesthesia Preprocedure Evaluation (Addendum)
Anesthesia Evaluation  Patient identified by MRN, date of birth, ID band Patient awake    Reviewed: Allergy & Precautions, NPO status , Patient's Chart, lab work & pertinent test results  Airway Mallampati: II  TM Distance: >3 FB Neck ROM: Full    Dental no notable dental hx.    Pulmonary neg pulmonary ROS,    Pulmonary exam normal        Cardiovascular hypertension, Pt. on medications  Rhythm:Regular Rate:Normal     Neuro/Psych negative neurological ROS  negative psych ROS   GI/Hepatic negative GI ROS, Neg liver ROS,   Endo/Other  negative endocrine ROS  Renal/GU CRFRenal disease  negative genitourinary   Musculoskeletal  (+) Arthritis ,   Abdominal Normal abdominal exam  (+)   Peds  Hematology  (+) HIV,   Anesthesia Other Findings   Reproductive/Obstetrics                           Anesthesia Physical Anesthesia Plan  ASA: 3  Anesthesia Plan: General   Post-op Pain Management: Tylenol PO (pre-op)* and Celebrex PO (pre-op)*   Induction: Intravenous  PONV Risk Score and Plan: 2 and Ondansetron, Dexamethasone, Midazolam and Treatment may vary due to age or medical condition  Airway Management Planned: Mask and Oral ETT  Additional Equipment: None  Intra-op Plan:   Post-operative Plan: Extubation in OR  Informed Consent: I have reviewed the patients History and Physical, chart, labs and discussed the procedure including the risks, benefits and alternatives for the proposed anesthesia with the patient or authorized representative who has indicated his/her understanding and acceptance.     Dental advisory given  Plan Discussed with: CRNA  Anesthesia Plan Comments: (Lab Results      Component                Value               Date                      WBC                      6.8                 12/28/2021                HGB                      12.2 (L)             12/28/2021                HCT                      34.9 (L)            12/28/2021                MCV                      85.7                12/28/2021                PLT                      254  12/28/2021           Lab Results      Component                Value               Date                      NA                       133 (L)             12/29/2021                K                        4.2                 12/29/2021                CO2                      18 (L)              12/29/2021                GLUCOSE                  79                  12/29/2021                BUN                      17                  12/29/2021                CREATININE               1.43 (H)            12/29/2021                CALCIUM                  8.4 (L)             12/29/2021                GFRNONAA                 >60                 12/29/2021          )       Anesthesia Quick Evaluation

## 2021-12-29 NOTE — Consult Note (Signed)
Consult Note  Lee [name] 12/07/1980  [id number].    Requesting MD: Marzetta Board, MD Chief Complaint/Reason for Consult: RUQ pain with cholelithiasis   HPI:  Patient is a 41 year old male who presented to the ED yesterday with abdominal pain and R lower chest pain. He has been having epigastric abdominal pain intermittently since early May but reports in the last 2 weeks it has been more constant. Pain starts in epigastrium and radiates across upper abdomen and to right chest. He reports associated nausea with bilious emesis. Pain is exacerbated by eating and occurs after pretty much anything he eats now. Does not feel like he has had similar symptoms prior to the last month. Denies fever, chills, urinary symptoms, diarrhea, constipation. 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 He is compliant with HAART and reports viral loads have been undetectable. Past abdominal surgery includes laparoscopic appendectomy. Allergies to lisinopril and nickel. He is not on blood thinners. He works for [employer] and is primarily at [employer].   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 He has never used smokeless tobacco. He  reports that he does not drink alcohol and does not use drugs.  Allergies:  Allergies  Allergen Reactions   Lisinopril Other (See Comments)    Dry cough     Nickel Rash    Medications Prior to Admission  Medication Sig Dispense Refill   albuterol (PROVENTIL HFA;VENTOLIN HFA) 108 (90 Base) MCG/ACT inhaler Inhale 2 puffs into the lungs every 6 (six) hours as needed for wheezing or shortness of breath. 1 Inhaler 1   amLODipine (NORVASC) 10 MG tablet Take 1 tablet (10 mg total) by mouth daily. (Patient taking differently: Take 10 mg by mouth at bedtime.) 30 tablet 0   azelastine (ASTELIN) 0.1 % nasal spray 1-2 puffs in each nostril twice daily     [name] 50-200-25 MG TABS tablet TAKE 1 TABLET DAILY (Patient taking differently: Take 1 tablet by mouth at bedtime.) 30 tablet 8   BREO ELLIPTA 200-25 MCG/ACT AEPB Inhale 1 puff into the lungs daily.     EPINEPHrine 0.3 mg/0.3 mL IJ SOAJ injection Inject 0.3 mg into the muscle once.     montelukast (SINGULAIR) 10 MG tablet Take 10 mg by mouth at bedtime.     topiramate (TOPAMAX) 50 MG tablet Take 50 mg by mouth at bedtime.     Vitamin D, Ergocalciferol, (DRISDOL) 1.25 MG (50000 UT) CAPS capsule Take 50,000 Units by mouth once a week. Friday     sulfamethoxazole-trimethoprim (BACTRIM DS) 800-160 MG tablet Take 1 tablet by mouth 2 (two)  times daily. (Patient not taking: Reported on 12/29/2021)      Blood pressure 117/70, pulse 85, temperature 98.4 F (36.9 C), temperature source Oral, resp. rate 16, height '6\' 4"'$  (1.93 m), weight 103.9 kg, SpO2 99 %. Physical Exam:  General: pleasant, WD, WN male who is laying in bed in NAD HEENT: head is normocephalic, atraumatic.  Sclera are anicteric.  Ears and nose without any masses or lesions.  Mouth is pink and moist Heart: regular, rate, and rhythm.   Lungs: CTAB, no wheezes, rhonchi, or rales noted.  Respiratory effort nonlabored Abd: soft, ttp in epigastrium and RUQ with positive murphy sign, ND, +BS, no  masses, hernias, or organomegaly MS: all 4 extremities are symmetrical with no cyanosis, clubbing, or edema. Skin: warm and dry with no masses, lesions, or rashes Neuro: Cranial nerves 2-12 grossly intact, sensation is normal throughout Psych: A&Ox3 with an appropriate affect.   Results for orders placed or performed during the hospital encounter of 12/28/21 (from the past 48 hour(s))  Comprehensive metabolic panel     Status: Abnormal   Collection Time: 12/28/21  9:07 PM  Result Value Ref Range   Sodium 130 (L) 135 - 145 mmol/L   Potassium 3.7 3.5 - 5.1 mmol/L   Chloride 106 98 - 111 mmol/L   CO2 17 (L) 22 - 32 mmol/L   Glucose, Bld 91 70 - 99 mg/dL    Comment: Glucose reference range applies only to samples taken after fasting for at least 8 hours.   BUN 15 6 - 20 mg/dL   Creatinine, Ser 1.67 (H) 0.61 - 1.24 mg/dL   Calcium 8.5 (L) 8.9 - 10.3 mg/dL   Total Protein 10.7 (H) 6.5 - 8.1 g/dL   Albumin 2.3 (L) 3.5 - 5.0 g/dL   AST 71 (H) 15 - 41 U/L   ALT 62 (H) 0 - 44 U/L   Alkaline Phosphatase 251 (H) 38 - 126 U/L   Total Bilirubin 1.3 (H) 0.3 - 1.2 mg/dL   GFR, Estimated 53 (L) >60 mL/min    Comment: (NOTE) Calculated using the CKD-EPI Creatinine Equation (2021)    Anion gap 7 5 - 15    Comment: Performed at [facility], [address] [place], [place] [address]  CBC with Differential     Status: Abnormal   Collection Time: 12/28/21  9:07 PM  Result Value Ref Range   WBC 6.8 4.0 - 10.5 K/uL   RBC 4.07 (L) 4.22 - 5.81 MIL/uL   Hemoglobin 12.2 (L) 13.0 - 17.0 g/dL   HCT 34.9 (L) 39.0 - 52.0 %   MCV 85.7 80.0 - 100.0 fL   MCH 30.0 26.0 - 34.0 pg   MCHC 35.0 30.0 - 36.0 g/dL   RDW 13.6 11.5 - 15.5 %   Platelets 254 150 - 400 K/uL   nRBC 0.0 0.0 - 0.2 %   Neutrophils Relative % 73 %   Neutro Abs 5.0 1.7 - 7.7 K/uL   Lymphocytes Relative 20 %   Lymphs Abs 1.3 0.7 - 4.0 K/uL   Monocytes Relative 7 %   Monocytes Absolute 0.5 0.1 - 1.0 K/uL   Eosinophils  Relative 0 %   Eosinophils Absolute 0.0 0.0 - 0.5 K/uL   Basophils Relative 0 %   Basophils Absolute 0.0 0.0 - 0.1 K/uL   Immature Granulocytes 0 %   Abs Immature Granulocytes 0.02 0.00 - 0.07 K/uL    Comment: Performed at [facility], [address] [place], [place] [address]  D-dimer, quantitative     Status: None   Collection Time: 12/28/21  9:07 PM  Result Value Ref Range   D-Dimer, Quant 0.48 0.00 - 0.50 ug/mL-FEU    Comment: (NOTE) At the manufacturer cut-off value of 0.5 g/mL FEU, this assay has a negative predictive value of 95-100%.This assay is intended for use in conjunction with a clinical pretest probability (PTP) assessment model to exclude pulmonary embolism (PE) and deep venous thrombosis (DVT) in outpatients suspected of PE or DVT. Results should be correlated with clinical presentation. Performed at [facility], [address] [place], [place] [address]   Troponin I (High Sensitivity)     Status: None   Collection Time: 12/28/21  9:07 PM  Result Value Ref Range   Troponin I (High Sensitivity) 4 <18 ng/L    Comment: (NOTE) Elevated high sensitivity troponin I (hsTnI) values and significant  changes across serial measurements may suggest ACS but many other  chronic and acute conditions are known to elevate hsTnI results.  Refer to the "Links" section for chest pain algorithms and additional  guidance. Performed at [facility], [address] [place], [place] [address]   Lipase, blood     Status: Abnormal   Collection Time: 12/28/21  9:07 PM  Result Value Ref Range   Lipase 54 (H) 11 - 51 U/L    Comment: Performed at [facility], [address] [place], [place] [address]  Comprehensive metabolic panel     Status: Abnormal   Collection Time: 12/29/21  9:21 AM  Result Value Ref Range   Sodium 133 (L) 135 - 145 mmol/L   Potassium 4.2 3.5 - 5.1 mmol/L   Chloride 109 98 - 111 mmol/L   CO2 18 (L) 22 - 32 mmol/L    Glucose, Bld 79 70 - 99 mg/dL    Comment: Glucose reference range applies only to samples taken after fasting for at least 8 hours.   BUN 17 6 - 20 mg/dL   Creatinine, Ser 1.43 (H) 0.61 - 1.24 mg/dL   Calcium 8.4 (L) 8.9 - 10.3 mg/dL   Total Protein 10.2 (H) 6.5 - 8.1 g/dL   Albumin 2.2 (L) 3.5 - 5.0 g/dL   AST 66 (H) 15 - 41 U/L   ALT 62 (H) 0 - 44 U/L   Alkaline Phosphatase 250 (H) 38 - 126 U/L   Total Bilirubin 1.6 (H) 0.3 - 1.2 mg/dL   GFR, Estimated >60 >60 mL/min    Comment: (NOTE) Calculated using the CKD-EPI Creatinine Equation (2021)    Anion gap 6 5 - 15    Comment: Performed at [facility], [place] [address], [place], [place] [address]   US Abdomen Limited RUQ (LIVER/GB)  Result Date: 12/28/2021 CLINICAL DATA:  Pancreatitis. EXAM: ULTRASOUND ABDOMEN LIMITED RIGHT UPPER QUADRANT COMPARISON:  Abdominal ultrasound 02/23/2017. CT abdomen and pelvis 04/06/2020 FINDINGS: Gallbladder: Small gallstones are present. Gallbladder polyp is present measuring 4 mm. No gallbladder wall thickening or pericholecystic fluid. No sonographic Murphy sign noted by sonographer. Common bile duct: Diameter: 4.1 mm Liver: No focal lesion identified. Within normal limits in parenchymal echogenicity. Portal vein is patent on color Doppler imaging with normal direction of blood flow towards the liver. Other: None. IMPRESSION: 1. Cholelithiasis. No additional sonographic evidence for acute cholecystitis. 2. 4 mm gallbladder polyp.  No follow-up necessary. Electronically Signed   By: Tina Griffiths.D.  On: 12/28/2021 22:42   DG Ribs Unilateral W/Chest Right  Result Date: 12/28/2021 CLINICAL DATA:  right rib pain with vomiting EXAM: RIGHT RIBS AND CHEST - 3+ VIEW COMPARISON:  Chest x-ray 07/14/2021 FINDINGS: The heart and mediastinal contours are within normal limits. No focal consolidation. No pulmonary edema. No pleural effusion. No pneumothorax. Nipple markers noted bilaterally. No acute  displaced fracture or other bone lesions are seen involving the ribs. IMPRESSION: 1. No acute displaced right rib fractures. Please note, nondisplaced rib fractures may be occult on radiograph. 2. No acute cardiopulmonary abnormality. Electronically Signed   By: Iven Finn M.D.   On: 12/28/2021 19:12      Assessment/Plan Acute calculous cholecystitis  - RUQ [place] 6/7 with cholelithiasis and gallbladder polyp, no evidence for acute cholecystitis  - LFTs mildly elevated, Tbili 1.6, lipase was mildly elevated at 54 yesterday  - no leukocytosis and afebrile  - hx consistent with biliary colic and exam significant for positive Murphy sign which is more suggestive for acute cholecystitis - Ok to have CLD today but will make NPO after MN and plan for laparoscopic cholecystectomy with possible IOC tomorrow. Will start on rocephin for abx coverage  FEN: CLD, IVF '@100'$  cc/h VTE: SQH ID: rocephin ordered  - below per TRH -  HIV on HAART CKD stage III HTN   I reviewed ED provider notes, hospitalist notes, last 24 h vitals and pain scores, last 48 h intake and output, last 24 h labs and trends, and last 24 h imaging results.   Norm Parcel, [facility] 12/29/2021, 12:16 PM Please see Amion for pager number during day hours 7:00am-4:30pm

## 2021-12-29 NOTE — Assessment & Plan Note (Signed)
Stable.  Continue allergy medicines.

## 2021-12-29 NOTE — H&P (View-Only) (Signed)
Consult Note  Lee [name] 12/07/1980  [id number].    Requesting MD: Marzetta Board, MD Chief Complaint/Reason for Consult: RUQ pain with cholelithiasis   HPI:  Patient is a 41 year old male who presented to the ED yesterday with abdominal pain and R lower chest pain. He has been having epigastric abdominal pain intermittently since early May but reports in the last 2 weeks it has been more constant. Pain starts in epigastrium and radiates across upper abdomen and to right chest. He reports associated nausea with bilious emesis. Pain is exacerbated by eating and occurs after pretty much anything he eats now. Does not feel like he has had similar symptoms prior to the last month. Denies fever, chills, urinary symptoms, diarrhea, constipation. 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 He is compliant with HAART and reports viral loads have been undetectable. Past abdominal surgery includes laparoscopic appendectomy. Allergies to lisinopril and nickel. He is not on blood thinners. He works for [employer] and is primarily at [employer].   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 He has never used smokeless tobacco. He  reports that he does not drink alcohol and does not use drugs.  Allergies:  Allergies  Allergen Reactions   Lisinopril Other (See Comments)    Dry cough     Nickel Rash    Medications Prior to Admission  Medication Sig Dispense Refill   albuterol (PROVENTIL HFA;VENTOLIN HFA) 108 (90 Base) MCG/ACT inhaler Inhale 2 puffs into the lungs every 6 (six) hours as needed for wheezing or shortness of breath. 1 Inhaler 1   amLODipine (NORVASC) 10 MG tablet Take 1 tablet (10 mg total) by mouth daily. (Patient taking differently: Take 10 mg by mouth at bedtime.) 30 tablet 0   azelastine (ASTELIN) 0.1 % nasal spray 1-2 puffs in each nostril twice daily     [name] 50-200-25 MG TABS tablet TAKE 1 TABLET DAILY (Patient taking differently: Take 1 tablet by mouth at bedtime.) 30 tablet 8   BREO ELLIPTA 200-25 MCG/ACT AEPB Inhale 1 puff into the lungs daily.     EPINEPHrine 0.3 mg/0.3 mL IJ SOAJ injection Inject 0.3 mg into the muscle once.     montelukast (SINGULAIR) 10 MG tablet Take 10 mg by mouth at bedtime.     topiramate (TOPAMAX) 50 MG tablet Take 50 mg by mouth at bedtime.     Vitamin D, Ergocalciferol, (DRISDOL) 1.25 MG (50000 UT) CAPS capsule Take 50,000 Units by mouth once a week. Friday     sulfamethoxazole-trimethoprim (BACTRIM DS) 800-160 MG tablet Take 1 tablet by mouth 2 (two)  times daily. (Patient not taking: Reported on 12/29/2021)      Blood pressure 117/70, pulse 85, temperature 98.4 F (36.9 C), temperature source Oral, resp. rate 16, height '6\' 4"'$  (1.93 m), weight 103.9 kg, SpO2 99 %. Physical Exam:  General: pleasant, WD, WN male who is laying in bed in NAD HEENT: head is normocephalic, atraumatic.  Sclera are anicteric.  Ears and nose without any masses or lesions.  Mouth is pink and moist Heart: regular, rate, and rhythm.   Lungs: CTAB, no wheezes, rhonchi, or rales noted.  Respiratory effort nonlabored Abd: soft, ttp in epigastrium and RUQ with positive murphy sign, ND, +BS, no  masses, hernias, or organomegaly MS: all 4 extremities are symmetrical with no cyanosis, clubbing, or edema. Skin: warm and dry with no masses, lesions, or rashes Neuro: Cranial nerves 2-12 grossly intact, sensation is normal throughout Psych: A&Ox3 with an appropriate affect.   Results for orders placed or performed during the hospital encounter of 12/28/21 (from the past 48 hour(s))  Comprehensive metabolic panel     Status: Abnormal   Collection Time: 12/28/21  9:07 PM  Result Value Ref Range   Sodium 130 (L) 135 - 145 mmol/L   Potassium 3.7 3.5 - 5.1 mmol/L   Chloride 106 98 - 111 mmol/L   CO2 17 (L) 22 - 32 mmol/L   Glucose, Bld 91 70 - 99 mg/dL    Comment: Glucose reference range applies only to samples taken after fasting for at least 8 hours.   BUN 15 6 - 20 mg/dL   Creatinine, Ser 1.67 (H) 0.61 - 1.24 mg/dL   Calcium 8.5 (L) 8.9 - 10.3 mg/dL   Total Protein 10.7 (H) 6.5 - 8.1 g/dL   Albumin 2.3 (L) 3.5 - 5.0 g/dL   AST 71 (H) 15 - 41 U/L   ALT 62 (H) 0 - 44 U/L   Alkaline Phosphatase 251 (H) 38 - 126 U/L   Total Bilirubin 1.3 (H) 0.3 - 1.2 mg/dL   GFR, Estimated 53 (L) >60 mL/min    Comment: (NOTE) Calculated using the CKD-EPI Creatinine Equation (2021)    Anion gap 7 5 - 15    Comment: Performed at [facility], [address] [place], [place] [address]  CBC with Differential     Status: Abnormal   Collection Time: 12/28/21  9:07 PM  Result Value Ref Range   WBC 6.8 4.0 - 10.5 K/uL   RBC 4.07 (L) 4.22 - 5.81 MIL/uL   Hemoglobin 12.2 (L) 13.0 - 17.0 g/dL   HCT 34.9 (L) 39.0 - 52.0 %   MCV 85.7 80.0 - 100.0 fL   MCH 30.0 26.0 - 34.0 pg   MCHC 35.0 30.0 - 36.0 g/dL   RDW 13.6 11.5 - 15.5 %   Platelets 254 150 - 400 K/uL   nRBC 0.0 0.0 - 0.2 %   Neutrophils Relative % 73 %   Neutro Abs 5.0 1.7 - 7.7 K/uL   Lymphocytes Relative 20 %   Lymphs Abs 1.3 0.7 - 4.0 K/uL   Monocytes Relative 7 %   Monocytes Absolute 0.5 0.1 - 1.0 K/uL   Eosinophils  Relative 0 %   Eosinophils Absolute 0.0 0.0 - 0.5 K/uL   Basophils Relative 0 %   Basophils Absolute 0.0 0.0 - 0.1 K/uL   Immature Granulocytes 0 %   Abs Immature Granulocytes 0.02 0.00 - 0.07 K/uL    Comment: Performed at [facility], [address] [place], [place] [address]  D-dimer, quantitative     Status: None   Collection Time: 12/28/21  9:07 PM  Result Value Ref Range   D-Dimer, Quant 0.48 0.00 - 0.50 ug/mL-FEU    Comment: (NOTE) At the manufacturer cut-off value of 0.5 g/mL FEU, this assay has a negative predictive value of 95-100%.This assay is intended for use in conjunction with a clinical pretest probability (PTP) assessment model to exclude pulmonary embolism (PE) and deep venous thrombosis (DVT) in outpatients suspected of PE or DVT. Results should be correlated with clinical presentation. Performed at [facility], [address] [place], [place] [address]   Troponin I (High Sensitivity)     Status: None   Collection Time: 12/28/21  9:07 PM  Result Value Ref Range   Troponin I (High Sensitivity) 4 <18 ng/L    Comment: (NOTE) Elevated high sensitivity troponin I (hsTnI) values and significant  changes across serial measurements may suggest ACS but many other  chronic and acute conditions are known to elevate hsTnI results.  Refer to the "Links" section for chest pain algorithms and additional  guidance. Performed at [facility], [address] [place], [place] [address]   Lipase, blood     Status: Abnormal   Collection Time: 12/28/21  9:07 PM  Result Value Ref Range   Lipase 54 (H) 11 - 51 U/L    Comment: Performed at [facility], [address] [place], [place] [address]  Comprehensive metabolic panel     Status: Abnormal   Collection Time: 12/29/21  9:21 AM  Result Value Ref Range   Sodium 133 (L) 135 - 145 mmol/L   Potassium 4.2 3.5 - 5.1 mmol/L   Chloride 109 98 - 111 mmol/L   CO2 18 (L) 22 - 32 mmol/L    Glucose, Bld 79 70 - 99 mg/dL    Comment: Glucose reference range applies only to samples taken after fasting for at least 8 hours.   BUN 17 6 - 20 mg/dL   Creatinine, Ser 1.43 (H) 0.61 - 1.24 mg/dL   Calcium 8.4 (L) 8.9 - 10.3 mg/dL   Total Protein 10.2 (H) 6.5 - 8.1 g/dL   Albumin 2.2 (L) 3.5 - 5.0 g/dL   AST 66 (H) 15 - 41 U/L   ALT 62 (H) 0 - 44 U/L   Alkaline Phosphatase 250 (H) 38 - 126 U/L   Total Bilirubin 1.6 (H) 0.3 - 1.2 mg/dL   GFR, Estimated >60 >60 mL/min    Comment: (NOTE) Calculated using the CKD-EPI Creatinine Equation (2021)    Anion gap 6 5 - 15    Comment: Performed at [facility], [place] [address], [place], [place] [address]   US Abdomen Limited RUQ (LIVER/GB)  Result Date: 12/28/2021 CLINICAL DATA:  Pancreatitis. EXAM: ULTRASOUND ABDOMEN LIMITED RIGHT UPPER QUADRANT COMPARISON:  Abdominal ultrasound 02/23/2017. CT abdomen and pelvis 04/06/2020 FINDINGS: Gallbladder: Small gallstones are present. Gallbladder polyp is present measuring 4 mm. No gallbladder wall thickening or pericholecystic fluid. No sonographic Murphy sign noted by sonographer. Common bile duct: Diameter: 4.1 mm Liver: No focal lesion identified. Within normal limits in parenchymal echogenicity. Portal vein is patent on color Doppler imaging with normal direction of blood flow towards the liver. Other: None. IMPRESSION: 1. Cholelithiasis. No additional sonographic evidence for acute cholecystitis. 2. 4 mm gallbladder polyp.  No follow-up necessary. Electronically Signed   By: Tina Griffiths.D.  On: 12/28/2021 22:42   DG Ribs Unilateral W/Chest Right  Result Date: 12/28/2021 CLINICAL DATA:  right rib pain with vomiting EXAM: RIGHT RIBS AND CHEST - 3+ VIEW COMPARISON:  Chest x-ray 07/14/2021 FINDINGS: The heart and mediastinal contours are within normal limits. No focal consolidation. No pulmonary edema. No pleural effusion. No pneumothorax. Nipple markers noted bilaterally. No acute  displaced fracture or other bone lesions are seen involving the ribs. IMPRESSION: 1. No acute displaced right rib fractures. Please note, nondisplaced rib fractures may be occult on radiograph. 2. No acute cardiopulmonary abnormality. Electronically Signed   By: Iven Finn M.D.   On: 12/28/2021 19:12      Assessment/Plan Acute calculous cholecystitis  - RUQ [place] 6/7 with cholelithiasis and gallbladder polyp, no evidence for acute cholecystitis  - LFTs mildly elevated, Tbili 1.6, lipase was mildly elevated at 54 yesterday  - no leukocytosis and afebrile  - hx consistent with biliary colic and exam significant for positive Murphy sign which is more suggestive for acute cholecystitis - Ok to have CLD today but will make NPO after MN and plan for laparoscopic cholecystectomy with possible IOC tomorrow. Will start on rocephin for abx coverage  FEN: CLD, IVF '@100'$  cc/h VTE: SQH ID: rocephin ordered  - below per TRH -  HIV on HAART CKD stage III HTN   I reviewed ED provider notes, hospitalist notes, last 24 h vitals and pain scores, last 48 h intake and output, last 24 h labs and trends, and last 24 h imaging results.   Norm Parcel, [facility] 12/29/2021, 12:16 PM Please see Amion for pager number during day hours 7:00am-4:30pm

## 2021-12-29 NOTE — Progress Notes (Signed)
Transition of Care [facility]) Screening Note  Patient Details  Name: Mirko Tailor Date of Birth: [birth year]  Transition of Care [facility]) CM/SW Contact:    Sherie Don, LCSW Phone Number: 12/29/2021, 10:51 AM  Transition of Care Department [facility]) has reviewed patient and no TOC needs have been identified at this time. We will continue to monitor patient advancement through interdisciplinary progression rounds. If new patient transition needs arise, please place a TOC consult.

## 2021-12-29 NOTE — Assessment & Plan Note (Signed)
Stable.  Continue Norvasc.  5 mg daily.

## 2021-12-29 NOTE — Progress Notes (Signed)
No charge note.  Patient seen and examined this morning, admitted overnight, H&P reviewed and agree with assessment and plan  41 year old male with HIV disease, well controlled on [name], comes into the hospital with several weeks of generalized abdominal discomfort, worse in the last 2 to 3 days.  Reports abdominal pain tends to get worse after eating heavy foods.  On admission, he was found to have elevated LFTs as well as borderline elevated lipase.  Right upper quadrant ultrasound shows cholelithiasis but no evidence of acute cholecystitis.  There is concern that he may have passed the stone and had a transient episode of pancreatitis.  He remains symptomatic this morning with right upper quadrant abdominal pain on palpation, epigastric pain as well requiring pain medications.  Keep n.p.o., discussed with general surgery, they will see in consultation  Kanisha Duba M. Cruzita Lederer, MD, PhD [facility]  Between 7 am - 7 pm you can contact me via Amion (for emergencies) or [place] (non urgent matters).  I am not available 7 pm - 7 am, please contact night coverage MD/APP via Amion

## 2021-12-29 NOTE — Assessment & Plan Note (Signed)
Stable

## 2021-12-29 NOTE — Assessment & Plan Note (Signed)
Stable.  Continue [name].  Patient follows with infectious disease clinic.  He states his viral load is undetectable.

## 2021-12-29 NOTE — Assessment & Plan Note (Addendum)
Likely due to recent vomiting.  Could be due to Bactrim that he was taking.  Continue normal saline.  Repeat CMP at 9 AM. Check TSH.

## 2021-12-29 NOTE — Subjective & Objective (Signed)
Chief complaint: Nausea and vomiting History of present illness 41 year old African-American male history of HIV disease on [name], history of allergic rhinitis who presents to the ER today with a 1 month history of generalized abdominal discomfort, to 3 days of nausea and vomiting.  Patient states that last week he noticed that his nocturia had gotten significantly worse.  He states that he has a very dry mouth due to all the allergy medicines he takes.  He drinks about 36 to 48 ounces of water before he goes to bed every night and normally urinates 2-3 times a night.  Over the last weekend, he noted that he started to urinate maybe 10-12 times a night.  He called his primary care doctor and saw them and patient was started on Bactrim due to concern for possible UTI.  Since starting Bactrim, he is felt increasingly nauseated.  He has vomited several times.  Yesterday started having right upper quadrant abdominal pain.  Pain got so severe he went to the ER for evaluation. Patient has noticed that over the last month of May, when he eats greasy or heavy foods, he feels some abdominal bloating, generalized abdominal discomfort.  He denies any diarrhea.  Over the last couple days with his vomiting, he noticed that his emesis has been yellow.  On admission the ER temperature 98.6 heart rate 110 blood pressure 135/81  Labs showed a sodium 130, bicarb of 17, BUN of 15, creatinine 0.67  AST of 71, ALT is 62, alk phos of 251, total bili 1.3  Lipase mildly elevated at 54  White count 8.6, hemoglobin 12.2, platelets 254  Right upper quadrant ultrasound was negative for sonographic Murphy sign.  There is some small gallstones present.  There is small gallbladder polyp measuring 4 mm.  There is no gallbladder wall thickening or pericholecystic fluid.  Patient transferred to [facility] for further evaluation and admission.

## 2021-12-29 NOTE — Assessment & Plan Note (Signed)
Observation medical bed admission.  Keep the patient NPO.  As needed oxycodone 5 mg for moderate pain and 0.5 mg IV Dilaudid for severe pain.  General surgery to see the patient in consult.  Based on his LFTs, he probably passed a small gallstone.  His abdomen is nonacute.  I do not think he has an infected gallbladder.  Start IV fluids was NPO.  General surgery discussed the patient surgical options with him.  Subcu heparin for DVT prophylaxis.

## 2021-12-29 NOTE — Discharge Instructions (Signed)
[facility] LAPAROSCOPIC SURGERY: POST OP INSTRUCTIONS Always review your discharge instruction sheet given to you by the facility where your surgery was performed. IF YOU HAVE DISABILITY OR FAMILY LEAVE FORMS, YOU MUST BRING THEM TO THE OFFICE FOR PROCESSING.   DO NOT GIVE THEM TO YOUR DOCTOR.  PAIN CONTROL  First take acetaminophen (Tylenol) AND/or ibuprofen (Advil) to control your pain after surgery.  Follow directions on package.  Taking acetaminophen (Tylenol) and/or ibuprofen (Advil) regularly after surgery will help to control your pain and lower the amount of prescription pain medication you may need.  You should not take more than 3,000 mg (3 grams) of acetaminophen (Tylenol) in 24 hours.  You should not take ibuprofen (Advil), aleve, motrin, naprosyn or other NSAIDS if you have a history of stomach ulcers or chronic kidney disease.  A prescription for pain medication may be given to you upon discharge.  Take your pain medication as prescribed, if you still have uncontrolled pain after taking acetaminophen (Tylenol) or ibuprofen (Advil). Use ice packs to help control pain. If you need a refill on your pain medication, please contact your pharmacy.  They will contact our office to request authorization. Prescriptions will not be filled after 5pm or on week-ends.  HOME MEDICATIONS Take your usually prescribed medications unless otherwise directed.  DIET You should follow a light diet the first few days after arrival home.  Be sure to include lots of fluids daily. Avoid fatty, fried foods.   CONSTIPATION It is common to experience some constipation after surgery and if you are taking pain medication.  Increasing fluid intake and taking a stool softener (such as Colace) will usually help or prevent this problem from occurring.  A mild laxative (Milk of Magnesia or Miralax) should be taken according to package instructions if there are no bowel movements after 48  hours.  WOUND/INCISION CARE Most patients will experience some swelling and bruising in the area of the incisions.  Ice packs will help.  Swelling and bruising can take several days to resolve.  Unless discharge instructions indicate otherwise, follow guidelines below  STERI-STRIPS - you may remove your outer bandages 48 hours after surgery, and you may shower at that time.  You have steri-strips (small skin tapes) in place directly over the incision.  These strips should be left on the skin for 7-10 days.   DERMABOND/SKIN GLUE - you may shower in 24 hours.  The glue will flake off over the next 2-3 weeks. Any sutures or staples will be removed at the office during your follow-up visit.  ACTIVITIES You may resume regular (light) daily activities beginning the next day--such as daily self-care, walking, climbing stairs--gradually increasing activities as tolerated.  You may have sexual intercourse when it is comfortable.  Refrain from any heavy lifting or straining until approved by your doctor. You may drive when you are no longer taking prescription pain medication, you can comfortably wear a seatbelt, and you can safely maneuver your car and apply brakes.  FOLLOW-UP You should see your doctor in the office for a follow-up appointment approximately 2-3 weeks after your surgery.  You should have been given your post-op/follow-up appointment when your surgery was scheduled.  If you did not receive a post-op/follow-up appointment, make sure that you call for this appointment within a day or two after you arrive home to insure a convenient appointment time.   WHEN TO CALL YOUR DOCTOR: Fever over 101.0 Inability to urinate Continued bleeding from incision.   pain, redness, or drainage from the incision. Increasing abdominal pain  The clinic staff is available to answer your questions during regular business hours.  Please don't hesitate to call and ask to speak to one of the nurses for  clinical concerns.  If you have a medical emergency, go to the nearest emergency room or call 911.  A surgeon from [facility] is always on call at the hospital. [address], [place], [place]  [address] ? [address] [address], [place], [place]   [address] [phone] ? [phone] ? FAX [phone] Web site: [email or web address]      Managing Your Pain After Surgery Without Opioids    Thank you for participating in our program to help patients manage their pain after surgery without opioids. This is part of our effort to provide you with the best care possible, without exposing you or your family to the risk that opioids pose.  What pain can I expect after surgery? You can expect to have some pain after surgery. This is normal. The pain is typically worse the day after surgery, and quickly begins to get better. Many studies have found that many patients are able to manage their pain after surgery with Over-the-Counter (OTC) medications such as Tylenol and Motrin. If you have a condition that does not allow you to take Tylenol or Motrin, notify your surgical team.  How will I manage my pain? The best strategy for controlling your pain after surgery is around the clock pain control with Tylenol (acetaminophen) and Motrin (ibuprofen or Advil). Alternating these medications with each other allows you to maximize your pain control. In addition to Tylenol and Motrin, you can use heating pads or ice packs on your incisions to help reduce your pain.  How will I alternate your regular strength over-the-counter pain medication? You will take a dose of pain medication every three hours. Start by taking 650 mg of Tylenol (2 pills of 325 mg) 3 hours later take 600 mg of Motrin (3 pills of 200 mg) 3 hours after taking the Motrin take 650 mg of Tylenol 3 hours after that take 600 mg of Motrin.   - 1 -  See example - if your first dose of Tylenol is at 12:00  PM   12:00 PM Tylenol 650 mg (2 pills of 325 mg)  3:00 PM Motrin 600 mg (3 pills of 200 mg)  6:00 PM Tylenol 650 mg (2 pills of 325 mg)  9:00 PM Motrin 600 mg (3 pills of 200 mg)  Continue alternating every 3 hours   We recommend that you follow this schedule around-the-clock for at least 3 days after surgery, or until you feel that it is no longer needed. Use the table on the last page of this handout to keep track of the medications you are taking. Important: Do not take more than 3000mg  of Tylenol or 3200mg  of Motrin in a 24-hour period. Do not take ibuprofen/Motrin if you have a history of bleeding stomach ulcers, severe kidney disease, &/or actively taking a blood thinner  What if I still have pain? If you have pain that is not controlled with the over-the-counter pain medications (Tylenol and Motrin or Advil) you might have what we call "breakthrough" pain. You will receive a prescription for a small amount of an opioid pain medication such as Oxycodone, Tramadol, or Tylenol with Codeine. Use these opioid pills in the first 24 hours after surgery if you have breakthrough pain. Do not  take more than 1 pill every 4-6 hours.  If you still have uncontrolled pain after using all opioid pills, don't hesitate to call our staff using the number provided. We will help make sure you are managing your pain in the best way possible, and if necessary, we can provide a prescription for additional pain medication.   Day 1    Time  Name of Medication Number of pills taken  Amount of Acetaminophen  Pain Level   Comments  AM PM       AM PM       AM PM       AM PM       AM PM       AM PM       AM PM       AM PM       Total Daily amount of Acetaminophen Do not take more than  3,000 mg per day      Day 2    Time  Name of Medication Number of pills taken  Amount of Acetaminophen  Pain Level   Comments  AM PM       AM PM       AM PM       AM PM       AM PM       AM PM       AM  PM       AM PM       Total Daily amount of Acetaminophen Do not take more than  3,000 mg per day      Day 3    Time  Name of Medication Number of pills taken  Amount of Acetaminophen  Pain Level   Comments  AM PM       AM PM       AM PM       AM PM          AM PM       AM PM       AM PM       AM PM       Total Daily amount of Acetaminophen Do not take more than  3,000 mg per day      Day 4    Time  Name of Medication Number of pills taken  Amount of Acetaminophen  Pain Level   Comments  AM PM       AM PM       AM PM       AM PM       AM PM       AM PM       AM PM       AM PM       Total Daily amount of Acetaminophen Do not take more than  3,000 mg per day      Day 5    Time  Name of Medication Number of pills taken  Amount of Acetaminophen  Pain Level   Comments  AM PM       AM PM       AM PM       AM PM       AM PM       AM PM       AM PM       AM PM       Total Daily amount of Acetaminophen Do not take more than  3,000  mg per day       Day 6    Time  Name of Medication Number of pills taken  Amount of Acetaminophen  Pain Level  Comments  AM PM       AM PM       AM PM       AM PM       AM PM       AM PM       AM PM       AM PM       Total Daily amount of Acetaminophen Do not take more than  3,000 mg per day      Day 7    Time  Name of Medication Number of pills taken  Amount of Acetaminophen  Pain Level   Comments  AM PM       AM PM       AM PM       AM PM       AM PM       AM PM       AM PM       AM PM       Total Daily amount of Acetaminophen Do not take more than  3,000 mg per day        For additional information about how and where to safely dispose of unused opioid medications - [email or web address]  Disclaimer: This document contains information and/or instructional materials adapted from [facility] for the typical patient with your condition. It does not replace medical advice  from your health care provider because your experience may differ from that of the typical patient. Talk to your health care provider if you have any questions about this document, your condition or your treatment plan. Adapted from [facility]  [facility]  LAPAROSCOPIC SURGERY:  POST-OP INSTRUCTIONS  Always review your discharge instruction sheet given to you by the facility where your surgery was performed.  A prescription for pain medication may be given to you upon discharge.  Take your pain medication as prescribed.  If narcotic pain medicine is not needed, then you may take acetaminophen (Tylenol) or ibuprofen (Advil) as needed.  Take your usually prescribed medications unless otherwise directed.  If you need a refill on your pain medication, please contact your pharmacy.  They will contact our office to request authorization. Prescriptions will not be filled after 5 P.M. or on weekends.  You should follow a light diet the first few days after arrival home, such as soup and crackers or toast.  Be sure to include plenty of fluids daily.  Most patients will experience some swelling and bruising in the area of the incisions.  Ice packs will help.  Swelling and bruising can take several days to resolve.   It is common to experience some constipation after surgery.  Increasing fluid intake and taking a stool softener (such as Colace) will usually help or prevent this problem from occurring.  A mild laxative (Milk of Magnesia or Miralax) should be taken according to package instructions if there has been no bowel movement after 48 hours.  You will likely have Dermabond (topical glue) over your incisions.  This seals the incisions and allows you to bathe and shower at any time after your surgery.  Glue should remain in place for up to 10 days.  It may be removed after 10 days by pealing off the Dermabond material or using Vaseline or naval jelly  to remove.  If you  have steri-strips over your incisions, you may remove the gauze bandage on the second day after surgery, and you may shower at that time.  Leave your steri-strips (small skin tapes) in place directly over the incision.  These strips should remain on the skin for 5-7 days and then be removed.  You may get them wet in the shower and pat them dry.  Any sutures or staples will be removed at the office during your follow-up visit.  ACTIVITIES:  You may resume regular (light) daily activities beginning the next day - such as daily self-care, walking, climbing stairs - gradually increasing activities as tolerated.  You may have sexual intercourse when it is comfortable.  Refrain from any heavy lifting or straining until approved by your doctor.  You may drive when you are no longer taking prescription pain medication, when you can comfortably wear a seatbelt, and when you can safely maneuver your car and apply brakes.  You should see your doctor in the office for a follow-up appointment approximately 2-3 weeks after your surgery.  Make sure that you call for this appointment within a day or two after you arrive home to insure a convenient appointment time.  WHEN TO CALL YOUR DOCTOR: Fever over 101.0 Inability to urinate Continued bleeding from incision Increased pain, redness, or drainage from the incision Increasing abdominal pain  The clinic staff is available to answer your questions during regular business hours.  Please don't hesitate to call and ask to speak to one of the nurses for clinical concerns.  If you have a medical emergency, go to the nearest emergency room or call 911.  A surgeon from [facility] is always on call for the hospital.  Darnell Level, MD [facility] Office: [phone] Toll Free:  [phone] FAX [phone]  Website: [email or web address]

## 2021-12-30 ENCOUNTER — Observation Stay (HOSPITAL_COMMUNITY): Payer: BC Managed Care – PPO

## 2021-12-30 ENCOUNTER — Encounter (HOSPITAL_COMMUNITY): Payer: Self-pay | Admitting: Internal Medicine

## 2021-12-30 ENCOUNTER — Encounter (HOSPITAL_COMMUNITY)
Admission: EM | Disposition: A | Discharge status: Home / Self Care | Payer: Self-pay | Source: Home / Self Care | Attending: Emergency Medicine

## 2021-12-30 ENCOUNTER — Observation Stay (HOSPITAL_COMMUNITY): Payer: BC Managed Care – PPO | Admitting: Anesthesiology

## 2021-12-30 ENCOUNTER — Other Ambulatory Visit: Payer: Self-pay

## 2021-12-30 DIAGNOSIS — N189 Chronic kidney disease, unspecified: Secondary | ICD-10-CM | POA: Diagnosis not present

## 2021-12-30 DIAGNOSIS — E871 Hypo-osmolality and hyponatremia: Secondary | ICD-10-CM | POA: Diagnosis not present

## 2021-12-30 DIAGNOSIS — M1990 Unspecified osteoarthritis, unspecified site: Secondary | ICD-10-CM | POA: Diagnosis not present

## 2021-12-30 DIAGNOSIS — K8020 Calculus of gallbladder without cholecystitis without obstruction: Secondary | ICD-10-CM | POA: Diagnosis not present

## 2021-12-30 DIAGNOSIS — N1831 Chronic kidney disease, stage 3a: Secondary | ICD-10-CM | POA: Diagnosis not present

## 2021-12-30 DIAGNOSIS — R109 Unspecified abdominal pain: Secondary | ICD-10-CM | POA: Diagnosis not present

## 2021-12-30 DIAGNOSIS — B20 Human immunodeficiency virus [HIV] disease: Secondary | ICD-10-CM | POA: Diagnosis not present

## 2021-12-30 DIAGNOSIS — Z79899 Other long term (current) drug therapy: Secondary | ICD-10-CM | POA: Diagnosis not present

## 2021-12-30 DIAGNOSIS — K851 Biliary acute pancreatitis without necrosis or infection: Secondary | ICD-10-CM | POA: Diagnosis not present

## 2021-12-30 DIAGNOSIS — K8012 Calculus of gallbladder with acute and chronic cholecystitis without obstruction: Secondary | ICD-10-CM | POA: Diagnosis not present

## 2021-12-30 DIAGNOSIS — I129 Hypertensive chronic kidney disease with stage 1 through stage 4 chronic kidney disease, or unspecified chronic kidney disease: Secondary | ICD-10-CM | POA: Diagnosis not present

## 2021-12-30 DIAGNOSIS — K8070 Calculus of gallbladder and bile duct without cholecystitis without obstruction: Secondary | ICD-10-CM | POA: Diagnosis not present

## 2021-12-30 DIAGNOSIS — Z9049 Acquired absence of other specified parts of digestive tract: Secondary | ICD-10-CM | POA: Diagnosis not present

## 2021-12-30 DIAGNOSIS — K8010 Calculus of gallbladder with chronic cholecystitis without obstruction: Secondary | ICD-10-CM | POA: Diagnosis not present

## 2021-12-30 HISTORY — PX: CHOLECYSTECTOMY: SHX55

## 2021-12-30 LAB — COMPREHENSIVE METABOLIC PANEL
ALT: 56 U/L — ABNORMAL HIGH (ref 0–44)
AST: 63 U/L — ABNORMAL HIGH (ref 15–41)
Albumin: 2.3 g/dL — ABNORMAL LOW (ref 3.5–5.0)
Alkaline Phosphatase: 239 U/L — ABNORMAL HIGH (ref 38–126)
Anion gap: 6 (ref 5–15)
BUN: 17 mg/dL (ref 6–20)
CO2: 18 mmol/L — ABNORMAL LOW (ref 22–32)
Calcium: 8.5 mg/dL — ABNORMAL LOW (ref 8.9–10.3)
Chloride: 108 mmol/L (ref 98–111)
Creatinine, Ser: 1.46 mg/dL — ABNORMAL HIGH (ref 0.61–1.24)
GFR, Estimated: >60 mL/min (ref >60)
Glucose, Bld: 80 mg/dL (ref 70–99)
Potassium: 4.3 mmol/L (ref 3.5–5.1)
Sodium: 132 mmol/L — ABNORMAL LOW (ref 135–145)
Total Bilirubin: 1.5 mg/dL — ABNORMAL HIGH (ref 0.3–1.2)
Total Protein: 9.9 g/dL — ABNORMAL HIGH (ref 6.5–8.1)

## 2021-12-30 LAB — CBC
HCT: 35.6 % — ABNORMAL LOW (ref 39.0–52.0)
Hemoglobin: 11.9 g/dL — ABNORMAL LOW (ref 13.0–17.0)
MCH: 30.7 pg (ref 26.0–34.0)
MCHC: 33.4 g/dL (ref 30.0–36.0)
MCV: 92 fL (ref 80.0–100.0)
Platelets: 242 10*3/uL (ref 150–400)
RBC: 3.87 MIL/uL — ABNORMAL LOW (ref 4.22–5.81)
RDW: 13.5 % (ref 11.5–15.5)
WBC: 5.2 10*3/uL (ref 4.0–10.5)
nRBC: 0 % (ref 0.0–0.2)

## 2021-12-30 LAB — MAGNESIUM: Magnesium: 1.9 mg/dL (ref 1.7–2.4)

## 2021-12-30 SURGERY — LAPAROSCOPIC CHOLECYSTECTOMY
Anesthesia: General | Site: Abdomen

## 2021-12-30 MED ORDER — IPRATROPIUM-ALBUTEROL 20-100 MCG/ACT IN AERS
INHALATION_SPRAY | RESPIRATORY_TRACT | Status: DC | PRN
  Administered 2021-12-30: 5 via RESPIRATORY_TRACT

## 2021-12-30 MED ORDER — DEXAMETHASONE SODIUM PHOSPHATE 10 MG/ML IJ SOLN
INTRAMUSCULAR | Status: AC
  Filled 2021-12-30: qty 1

## 2021-12-30 MED ORDER — PROPOFOL 10 MG/ML IV BOLUS
INTRAVENOUS | Status: AC
  Filled 2021-12-30: qty 20

## 2021-12-30 MED ORDER — MIDAZOLAM HCL 5 MG/5ML IJ SOLN
INTRAMUSCULAR | Status: DC | PRN
  Administered 2021-12-30 (×2): 1 mg via INTRAVENOUS

## 2021-12-30 MED ORDER — CHLORHEXIDINE GLUCONATE 0.12 % MT SOLN
15.0000 mL | Freq: Once | OROMUCOSAL | Status: AC
  Administered 2021-12-30: 15 mL via OROMUCOSAL

## 2021-12-30 MED ORDER — SUGAMMADEX SODIUM 200 MG/2ML IV SOLN
INTRAVENOUS | Status: DC | PRN
  Administered 2021-12-30: 200 mg via INTRAVENOUS
  Administered 2021-12-30: 50 mg via INTRAVENOUS

## 2021-12-30 MED ORDER — ACETAMINOPHEN 650 MG RE SUPP: 650.0000 mg | Freq: Four times a day (QID) | RECTAL | Status: DC | PRN

## 2021-12-30 MED ORDER — LACTATED RINGERS IR SOLN
Status: DC | PRN
  Administered 2021-12-30: 1000 mL

## 2021-12-30 MED ORDER — ACETAMINOPHEN 500 MG PO TABS
1000.0000 mg | ORAL_TABLET | Freq: Once | ORAL | Status: AC
  Administered 2021-12-30: 1000 mg via ORAL
  Filled 2021-12-30: qty 2

## 2021-12-30 MED ORDER — KETAMINE HCL 10 MG/ML IJ SOLN
INTRAMUSCULAR | Status: DC | PRN
  Administered 2021-12-30: 40 mg via INTRAVENOUS

## 2021-12-30 MED ORDER — PROPOFOL 10 MG/ML IV BOLUS
INTRAVENOUS | Status: DC | PRN
  Administered 2021-12-30: 200 mg via INTRAVENOUS
  Administered 2021-12-30: 50 mg via INTRAVENOUS
  Administered 2021-12-30: 30 mg via INTRAVENOUS

## 2021-12-30 MED ORDER — ONDANSETRON HCL 4 MG/2ML IJ SOLN
4.0000 mg | Freq: Four times a day (QID) | INTRAMUSCULAR | Status: DC | PRN
  Administered 2021-12-30 (×2): 4 mg via INTRAVENOUS
  Filled 2021-12-30 (×2): qty 2

## 2021-12-30 MED ORDER — 0.9 % SODIUM CHLORIDE (POUR BTL) OPTIME
TOPICAL | Status: DC | PRN
  Administered 2021-12-30: 1000 mL

## 2021-12-30 MED ORDER — KETAMINE HCL 10 MG/ML IJ SOLN
INTRAMUSCULAR | Status: AC
  Filled 2021-12-30: qty 1

## 2021-12-30 MED ORDER — OXYCODONE HCL 5 MG PO TABS
5.0000 mg | ORAL_TABLET | ORAL | Status: DC | PRN
  Administered 2021-12-30 (×2): 10 mg via ORAL
  Administered 2021-12-31: 5 mg via ORAL
  Filled 2021-12-30 (×3): qty 2

## 2021-12-30 MED ORDER — TRAMADOL HCL 50 MG PO TABS: 50.0000 mg | ORAL_TABLET | Freq: Four times a day (QID) | ORAL | Status: DC | PRN

## 2021-12-30 MED ORDER — HYDROMORPHONE HCL 1 MG/ML IJ SOLN
1.0000 mg | INTRAMUSCULAR | Status: DC | PRN
  Administered 2021-12-30 (×2): 1 mg via INTRAVENOUS
  Filled 2021-12-30 (×2): qty 1

## 2021-12-30 MED ORDER — FENTANYL CITRATE PF 50 MCG/ML IJ SOSY
25.0000 ug | PREFILLED_SYRINGE | INTRAMUSCULAR | Status: DC | PRN
  Administered 2021-12-30: 50 ug via INTRAVENOUS

## 2021-12-30 MED ORDER — PHENYLEPHRINE 80 MCG/ML (10ML) SYRINGE FOR IV PUSH (FOR BLOOD PRESSURE SUPPORT)
PREFILLED_SYRINGE | INTRAVENOUS | Status: DC | PRN
  Administered 2021-12-30: 80 ug via INTRAVENOUS

## 2021-12-30 MED ORDER — BUPIVACAINE-EPINEPHRINE (PF) 0.25% -1:200000 IJ SOLN
INTRAMUSCULAR | Status: DC | PRN
  Administered 2021-12-30: 30 mL

## 2021-12-30 MED ORDER — PHENYLEPHRINE 80 MCG/ML (10ML) SYRINGE FOR IV PUSH (FOR BLOOD PRESSURE SUPPORT)
PREFILLED_SYRINGE | INTRAVENOUS | Status: AC
  Filled 2021-12-30: qty 10

## 2021-12-30 MED ORDER — ONDANSETRON 4 MG PO TBDP: 4.0000 mg | ORAL_TABLET | Freq: Four times a day (QID) | ORAL | Status: DC | PRN

## 2021-12-30 MED ORDER — FENTANYL CITRATE (PF) 100 MCG/2ML IJ SOLN
INTRAMUSCULAR | Status: DC | PRN
  Administered 2021-12-30 (×4): 50 ug via INTRAVENOUS

## 2021-12-30 MED ORDER — SODIUM CHLORIDE (PF) 0.9 % IJ SOLN
INTRAMUSCULAR | Status: DC | PRN
  Administered 2021-12-30: 5 mL

## 2021-12-30 MED ORDER — ALBUTEROL SULFATE HFA 108 (90 BASE) MCG/ACT IN AERS
INHALATION_SPRAY | RESPIRATORY_TRACT | Status: AC
  Filled 2021-12-30: qty 6.7

## 2021-12-30 MED ORDER — MIDAZOLAM HCL 2 MG/2ML IJ SOLN
INTRAMUSCULAR | Status: AC
  Filled 2021-12-30: qty 2

## 2021-12-30 MED ORDER — ONDANSETRON HCL 4 MG/2ML IJ SOLN
INTRAMUSCULAR | Status: AC
  Filled 2021-12-30: qty 2

## 2021-12-30 MED ORDER — LIDOCAINE HCL (CARDIAC) PF 100 MG/5ML IV SOSY
PREFILLED_SYRINGE | INTRAVENOUS | Status: DC | PRN
  Administered 2021-12-30: 50 mg via INTRAVENOUS

## 2021-12-30 MED ORDER — FENTANYL CITRATE PF 50 MCG/ML IJ SOSY
PREFILLED_SYRINGE | INTRAMUSCULAR | Status: AC
  Filled 2021-12-30: qty 1

## 2021-12-30 MED ORDER — CELECOXIB 200 MG PO CAPS
200.0000 mg | ORAL_CAPSULE | Freq: Once | ORAL | Status: AC
  Administered 2021-12-30: 200 mg via ORAL
  Filled 2021-12-30: qty 1

## 2021-12-30 MED ORDER — SODIUM CHLORIDE 0.45 % IV SOLN: INTRAVENOUS | Status: DC

## 2021-12-30 MED ORDER — LIDOCAINE HCL (PF) 2 % IJ SOLN
INTRAMUSCULAR | Status: AC
  Filled 2021-12-30: qty 5

## 2021-12-30 MED ORDER — ROCURONIUM BROMIDE 100 MG/10ML IV SOLN
INTRAVENOUS | Status: DC | PRN
  Administered 2021-12-30: 20 mg via INTRAVENOUS
  Administered 2021-12-30: 60 mg via INTRAVENOUS
  Administered 2021-12-30: 20 mg via INTRAVENOUS

## 2021-12-30 MED ORDER — FENTANYL CITRATE (PF) 250 MCG/5ML IJ SOLN
INTRAMUSCULAR | Status: AC
  Filled 2021-12-30: qty 5

## 2021-12-30 MED ORDER — BUPIVACAINE-EPINEPHRINE (PF) 0.25% -1:200000 IJ SOLN
INTRAMUSCULAR | Status: AC
  Filled 2021-12-30: qty 30

## 2021-12-30 MED ORDER — DEXAMETHASONE SODIUM PHOSPHATE 10 MG/ML IJ SOLN
INTRAMUSCULAR | Status: DC | PRN
  Administered 2021-12-30: 8 mg via INTRAVENOUS

## 2021-12-30 MED ORDER — LACTATED RINGERS IV SOLN: INTRAVENOUS | Status: DC

## 2021-12-30 MED ORDER — ACETAMINOPHEN 325 MG PO TABS
650.0000 mg | ORAL_TABLET | Freq: Four times a day (QID) | ORAL | Status: DC | PRN
  Administered 2021-12-30: 650 mg via ORAL
  Filled 2021-12-30: qty 2

## 2021-12-30 MED ORDER — ROCURONIUM BROMIDE 10 MG/ML (PF) SYRINGE
PREFILLED_SYRINGE | INTRAVENOUS | Status: AC
  Filled 2021-12-30: qty 10

## 2021-12-30 MED ORDER — ORAL CARE MOUTH RINSE: 15.0000 mL | Freq: Once | OROMUCOSAL | Status: AC

## 2021-12-30 MED ORDER — ONDANSETRON HCL 4 MG/2ML IJ SOLN
INTRAMUSCULAR | Status: DC | PRN
  Administered 2021-12-30: 4 mg via INTRAVENOUS

## 2021-12-30 SURGICAL SUPPLY — 47 items
ADH SKN CLS APL DERMABOND .7 (GAUZE/BANDAGES/DRESSINGS) ×1
APL PRP STRL LF DISP 70% ISPRP (MISCELLANEOUS) ×1
APPLIER CLIP 5 13 M/L LIGAMAX5 (MISCELLANEOUS) ×2
APPLIER CLIP ROT 10 11.4 M/L (STAPLE)
APR CLP MED LRG 11.4X10 (STAPLE)
APR CLP MED LRG 5 ANG JAW (MISCELLANEOUS) ×1
BAG COUNTER SPONGE SURGICOUNT (BAG) IMPLANT
BAG RETRIEVAL 10 (BASKET) ×1
BAG SPNG CNTER NS LX DISP (BAG)
CABLE HIGH FREQUENCY MONO STRZ (ELECTRODE) ×3 IMPLANT
CHLORAPREP W/TINT 26 (MISCELLANEOUS) ×3 IMPLANT
CLIP APPLIE 5 13 M/L LIGAMAX5 (MISCELLANEOUS) IMPLANT
CLIP APPLIE ROT 10 11.4 M/L (STAPLE) ×2 IMPLANT
COVER MAYO STAND STRL (DRAPES) ×1 IMPLANT
COVER SURGICAL LIGHT HANDLE (MISCELLANEOUS) ×3 IMPLANT
DERMABOND ADVANCED (GAUZE/BANDAGES/DRESSINGS) ×1
DERMABOND ADVANCED .7 DNX12 (GAUZE/BANDAGES/DRESSINGS) IMPLANT
DRAPE C-ARM 42X120 X-RAY (DRAPES) ×1 IMPLANT
ELECT REM PT RETURN 15FT ADLT (MISCELLANEOUS) ×3 IMPLANT
GLOVE SURG ORTHO 8.0 STRL STRW (GLOVE) ×3 IMPLANT
GLOVE SURG SYN 7.5  E (GLOVE) ×2
GLOVE SURG SYN 7.5 E (GLOVE) ×1 IMPLANT
GLOVE SURG SYN 7.5 PF PI (GLOVE) ×2 IMPLANT
GOWN STRL REUS W/ TWL XL LVL3 (GOWN DISPOSABLE) ×4 IMPLANT
GOWN STRL REUS W/TWL XL LVL3 (GOWN DISPOSABLE) ×4
HEMOSTAT SURGICEL 4X8 (HEMOSTASIS) IMPLANT
IRRIG SUCT STRYKERFLOW 2 WTIP (MISCELLANEOUS) ×2
IRRIGATION SUCT STRKRFLW 2 WTP (MISCELLANEOUS) ×2 IMPLANT
KIT BASIN OR (CUSTOM PROCEDURE TRAY) ×3 IMPLANT
KIT TURNOVER KIT A (KITS) IMPLANT
PENCIL SMOKE EVACUATOR (MISCELLANEOUS) IMPLANT
SCISSORS LAP 5X35 DISP (ENDOMECHANICALS) ×3 IMPLANT
SET CHOLANGIOGRAPH MIX (MISCELLANEOUS) ×3 IMPLANT
SET TUBE SMOKE EVAC HIGH FLOW (TUBING) ×3 IMPLANT
SLEEVE Z-THREAD 5X100MM (TROCAR) ×4 IMPLANT
SPIKE FLUID TRANSFER (MISCELLANEOUS) ×3 IMPLANT
STRIP CLOSURE SKIN 1/2X4 (GAUZE/BANDAGES/DRESSINGS) ×2 IMPLANT
SUT MNCRL AB 4-0 PS2 18 (SUTURE) ×1 IMPLANT
SUT VIC AB 4-0 PS2 27 (SUTURE) ×2 IMPLANT
SYS BAG RETRIEVAL 10MM (BASKET) ×1
SYSTEM BAG RETRIEVAL 10MM (BASKET) ×2 IMPLANT
TAPE CLOTH 4X10 WHT NS (GAUZE/BANDAGES/DRESSINGS) IMPLANT
TOWEL OR 17X26 10 PK STRL BLUE (TOWEL DISPOSABLE) ×3 IMPLANT
TRAY LAPAROSCOPIC (CUSTOM PROCEDURE TRAY) ×3 IMPLANT
TROCAR BALLN 12MMX100 BLUNT (TROCAR) ×3 IMPLANT
TROCAR XCEL NON-BLD 11X100MML (ENDOMECHANICALS) ×2 IMPLANT
TROCAR Z-THREAD OPTICAL 5X100M (TROCAR) ×3 IMPLANT

## 2021-12-30 NOTE — Transfer of Care (Signed)
Immediate Anesthesia Transfer of Care Note  Patient: Lee [name]  Procedure(s) Performed: LAPAROSCOPIC CHOLECYSTECTOMY WITH  CHOLANGIOGRAM (Abdomen)  Patient Location: PACU  Anesthesia Type:General  Level of Consciousness: oriented, drowsy and patient cooperative  Airway & Oxygen Therapy: Patient Spontanous Breathing and Patient connected to face mask oxygen  Post-op Assessment: Report given to RN and Post -op Vital signs reviewed and stable  Post vital signs: Reviewed and stable  Last Vitals:  Vitals Value Taken Time  BP 125/71 12/30/21 0924  Temp    Pulse 97 12/30/21 0927  Resp 16 12/30/21 0927  SpO2 100 % 12/30/21 0927  Vitals shown include unvalidated device data.  Last Pain:  Vitals:   12/30/21 0626  TempSrc:   PainSc: 3       Patients Stated Pain Goal: 3 (97/18/20 9906)  Complications: No notable events documented.

## 2021-12-30 NOTE — Progress Notes (Signed)
PROGRESS NOTE  Lee [name] CHE:[id number] DOB: Aug 02, 1980 DOA: 12/28/2021 PCP: [name], NP   LOS: 0 days   Brief Narrative / Interim history: 41 year old male with HIV disease, well controlled on [name], comes into the hospital with several weeks of generalized abdominal discomfort, worse in the last 2 to 3 days.  Reports abdominal pain tends to get worse after eating heavy foods.  On admission, he was found to have elevated LFTs as well as borderline elevated lipase.  Right upper quadrant ultrasound shows cholelithiasis but no evidence of acute cholecystitis.  There is concern that he may have passed the stone and had a transient episode of pancreatitis  Subjective / 24h Interval events: Groggy postoperatively.  Assesement and Plan: Principal Problem:   Gallbladder colic Active Problems:   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HCC)   Allergic rhinitis   Essential hypertension   Stage 3a chronic kidney disease (CKD) (HCC) - baseline SCr 1.29-1.67   Hyponatremia   Principal problem Symptomatic cholelithiasis-General surgery consulted, underwent laparoscopic cholecystectomy this morning.  Postop management per surgery.  Clear liquids for now, continue pain control.  Had increased vomiting this afternoon, possibly related to anesthesia.  Supportive care  Active problems CKD 3A-baseline creatinine 1.3-1.6, currently at baseline  HIV infection-stable, follows with ID as an outpatient, continue home medications  Hyponatremia-mild, stable  Essential hypertension-continue home medications  Scheduled Meds:  fentaNYL       Continuous Infusions:  sodium chloride 50 mL/hr at 12/30/21 1023   PRN Meds:.acetaminophen **OR** acetaminophen, fentaNYL, HYDROmorphone (DILAUDID) injection, ondansetron **OR** ondansetron (ZOFRAN) IV, oxyCODONE, traMADol  Diet Orders (From admission, onward)     Start     Ordered   12/30/21 1017  Diet clear liquid Room service appropriate?  Yes; Fluid consistency: Thin  Diet effective now       Question Answer Comment  Room service appropriate? Yes   Fluid consistency: Thin      12/30/21 1016            DVT prophylaxis: SCD's Start: 12/30/21 1017   Lab Results  Component Value Date   PLT 242 12/30/2021      Code Status: Full Code  Family Communication: no family at bedside   Status is: Observation  The patient will require care spanning > 2 midnights and should be moved to inpatient because: surgery today   Level of care: Med-Surg  Consultants:  General surgery   Objective: Vitals:   12/30/21 0945 12/30/21 1000 12/30/21 1020 12/30/21 1121  BP: 118/74 124/74 127/81 123/85  Pulse: 89 91 77 76  Resp: '16 14 18 16  '$ Temp:  97.6 F (36.4 C) 98.7 F (37.1 C)   TempSrc:   Oral   SpO2: 99% 96% 98% 95%  Weight:      Height:        Intake/Output Summary (Last 24 hours) at 12/30/2021 1130 Last data filed at 12/30/2021 1023 Gross per 24 hour  Intake 3189.88 ml  Output 1715 ml  Net 1474.88 ml   Wt Readings from Last 3 Encounters:  12/30/21 103.9 kg  07/14/21 112.5 kg  05/26/21 115.3 kg    Examination:  Constitutional: NAD Eyes: no scleral icterus ENMT: Mucous membranes are moist.  Neck: normal, supple Respiratory: clear to auscultation bilaterally, no wheezing, no crackles.  Cardiovascular: Regular rate and rhythm, no murmurs / rubs / gallops. No LE edema. Good peripheral pulses Abdomen: deferred, just had surgery   Musculoskeletal: no clubbing / cyanosis.  Skin: no rashes  Neurologic: non focal   Data Reviewed: I have independently reviewed following labs and imaging studies   CBC Recent Labs  Lab 12/28/21 2107 12/30/21 0425  WBC 6.8 5.2  HGB 12.2* 11.9*  HCT 34.9* 35.6*  PLT 254 242  MCV 85.7 92.0  MCH 30.0 30.7  MCHC 35.0 33.4  RDW 13.6 13.5  LYMPHSABS 1.3  --   MONOABS 0.5  --   EOSABS 0.0  --   BASOSABS 0.0  --     Recent Labs  Lab 12/28/21 2107 12/29/21 0921  12/30/21 0425  NA 130* 133* 132*  K 3.7 4.2 4.3  CL 106 109 108  CO2 17* 18* 18*  GLUCOSE 91 79 80  BUN '15 17 17  '$ CREATININE 1.67* 1.43* 1.46*  CALCIUM 8.5* 8.4* 8.5*  AST 71* 66* 63*  ALT 62* 62* 56*  ALKPHOS 251* 250* 239*  BILITOT 1.3* 1.6* 1.5*  ALBUMIN 2.3* 2.2* 2.3*  MG  --   --  1.9  DDIMER 0.48  --   --     ------------------------------------------------------------------------------------------------------------------ No results for input(s): "CHOL", "HDL", "LDLCALC", "TRIG", "CHOLHDL", "LDLDIRECT" in the last 72 hours.  Lab Results  Component Value Date   HGBA1C 5.1 09/10/2017   ------------------------------------------------------------------------------------------------------------------ No results for input(s): "TSH", "T4TOTAL", "T3FREE", "THYROIDAB" in the last 72 hours.  Invalid input(s): "FREET3"  Cardiac Enzymes No results for input(s): "CKMB", "TROPONINI", "MYOGLOBIN" in the last 168 hours.  Invalid input(s): "CK" ------------------------------------------------------------------------------------------------------------------    Component Value Date/Time   BNP 20.0 07/14/2021 0419    CBG: No results for input(s): "GLUCAP" in the last 168 hours.  Recent Results (from the past 240 hour(s))  Surgical pcr screen     Status: None   Collection Time: 12/29/21  1:06 PM   Specimen: Nasal Mucosa; Nasal Swab  Result Value Ref Range Status   MRSA, PCR NEGATIVE NEGATIVE Final   Staphylococcus aureus NEGATIVE NEGATIVE Final    Comment: (NOTE) The Xpert SA Assay (FDA approved for NASAL specimens in patients 77 years of age and older), is one component of a comprehensive surveillance program. It is not intended to diagnose infection nor to guide or monitor treatment. Performed at [facility], [place] [address] [place], [place] [address]      Radiology Studies: DG Cholangiogram Operative  Result Date: 12/30/2021 CLINICAL DATA:   Cholecystectomy for symptomatic cholelithiasis. EXAM: INTRAOPERATIVE CHOLANGIOGRAM TECHNIQUE: Cholangiographic images from the C-arm fluoroscopic device were submitted for interpretation post-operatively. Please see the procedural report for the amount of contrast and the fluoroscopy time utilized. FLUOROSCOPY: Radiation Exposure Index (as provided by the fluoroscopic device): 11.2 mGy Kerma COMPARISON:  Right upper quadrant ultrasound on 12/28/2021 FINDINGS: Intraoperative imaging with a C-arm demonstrates normal opacified cystic duct stump, common bile duct and visualized intrahepatic ducts. No evidence of biliary dilatation, stricture, filling defect or contrast extravasation. Contrast enters the duodenum normally. IMPRESSION: Normal intraoperative cholangiogram. Electronically Signed   By: Aletta Edouard M.D.   On: 12/30/2021 09:12     Marzetta Board, MD, PhD [facility]  Between 7 am - 7 pm I am available, please contact me via Amion (for emergencies) or Securechat (non urgent messages)  Between 7 pm - 7 am I am not available, please contact night coverage MD/APP via Amion

## 2021-12-30 NOTE — Anesthesia Procedure Notes (Signed)
Procedure Name: Intubation Date/Time: 12/30/2021 7:47 AM  Performed by: Garrel Ridgel, CRNAPre-anesthesia Checklist: Patient identified, Emergency Drugs available, Suction available and Patient being monitored Patient Re-evaluated:Patient Re-evaluated prior to induction Oxygen Delivery Method: Circle system utilized Preoxygenation: Pre-oxygenation with 100% oxygen Induction Type: IV induction Ventilation: Mask ventilation without difficulty Laryngoscope Size: Miller and 3 (ETT by DRStoltzfus) Grade View: Grade III Tube type: Oral Tube size: 7.5 mm Number of attempts: 2 Airway Equipment and Method: Stylet and Oral airway Placement Confirmation: ETT inserted through vocal cords under direct vision, positive ETCO2 and breath sounds checked- equal and bilateral Secured at: 23 cm Dental Injury: Teeth and Oropharynx as per pre-operative assessment and Injury to lip

## 2021-12-30 NOTE — Interval H&P Note (Signed)
History and Physical Interval Note:  12/30/2021 7:16 AM  Lee [name]  has presented today for surgery, with the diagnosis of ACUTE CHOLECYSTITIS.  The various methods of treatment have been discussed with the patient and family. After consideration of risks, benefits and other options for treatment, the patient has consented to    Procedure(s): LAPAROSCOPIC CHOLECYSTECTOMY WITH POSSIBLE CHOLANGIOGRAM (N/A) as a surgical intervention.    The patient's history has been reviewed, patient examined, no change in status, stable for surgery.  I have reviewed the patient's chart and labs.  Questions were answered to the patient's satisfaction.    [name], [facility] [place] practice Office: [place]

## 2021-12-30 NOTE — Anesthesia Postprocedure Evaluation (Signed)
Anesthesia Post Note  Patient: Lee [name]  Procedure(s) Performed: LAPAROSCOPIC CHOLECYSTECTOMY WITH  CHOLANGIOGRAM (Abdomen)     Patient location during evaluation: PACU Anesthesia Type: General Level of consciousness: awake and alert Pain management: pain level controlled Vital Signs Assessment: post-procedure vital signs reviewed and stable Respiratory status: spontaneous breathing, nonlabored ventilation, respiratory function stable and patient connected to nasal cannula oxygen Cardiovascular status: blood pressure returned to baseline and stable Postop Assessment: no apparent nausea or vomiting Anesthetic complications: no   No notable events documented.  Last Vitals:  Vitals:   12/30/21 1121 12/30/21 1217  BP: 123/85 128/79  Pulse: 76 69  Resp: 16 18  Temp:  36.7 C  SpO2: 95% 94%    Last Pain:  Vitals:   12/30/21 1323  TempSrc:   PainSc: 4                  Mathea Frieling P Terius Jacuinde

## 2021-12-30 NOTE — Op Note (Signed)
Procedure Note  Pre-operative Diagnosis:  biliary colic, cholelithiasis, gallbladder polyp  Post-operative Diagnosis:  same  Surgeon:  [name], MD  Assistant:  none   Procedure:  Laparoscopic cholecystectomy with intra-operative cholangiography  Anesthesia:  General  Estimated Blood Loss:  minimal  Drains: none         Specimen: gallbladder to pathology  Indications:  Patient is a 41 year old male who presented to the ED yesterday with abdominal pain and R lower chest pain. He has been having epigastric abdominal pain intermittently since early May but reports in the last 2 weeks it has been more constant. Pain starts in epigastrium and radiates across upper abdomen and to right chest. He reports associated nausea with bilious emesis. Pain is exacerbated by eating and occurs after pretty much anything he eats now. Does not feel like he has had similar symptoms prior to the last month. USN demonstrates cholelithiasis and gallbladder polyp.  Patient now comes to surgery for cholecystectomy.  Procedure description: The patient was seen in the pre-op holding area. The risks, benefits, complications, treatment options, and expected outcomes were previously discussed with the patient. The patient agreed with the proposed plan and has signed the informed consent form.  The patient was transported to operating room #2 at the [facility]. The patient was placed in the supine position on the operating room table. Following induction of general anesthesia, the abdomen was prepped and draped in the usual aseptic fashion.  An incision was made in the skin near the umbilicus. The midline fascia was incised and the peritoneal cavity was entered and a Hasson cannula was introduced under direct vision. The cannula was secured with a 0-Vicryl pursestring suture. Pneumoperitoneum was established with carbon dioxide. Additional cannulae were introduced under direct vision along the right costal  margin in the midline, mid-clavicular line, and anterior axillary line.   The gallbladder was identified and the fundus grasped and retracted cephalad. Adhesions were taken down bluntly and the electrocautery was utilized as needed, taking care not to involve any adjacent structures. The infundibulum was grasped and retracted laterally, exposing the peritoneum overlying the triangle of Calot. The peritoneum was incised and structures exposed with blunt dissection. The cystic duct was clearly identified, bluntly dissected circumferentially, and clipped at the neck of the gallbladder.  An incision was made in the cystic duct and the cholangiogram catheter introduced. The catheter was secured using an ligaclip.  Real-time cholangiography was performed using C-arm fluoroscopy.  There was rapid filling of a normal caliber common bile duct.  There was reflux of contrast into the left and right hepatic ductal systems.  There was free flow distally into the duodenum without filling defect or obstruction.  The catheter was removed from the peritoneal cavity.  The cystic duct was then ligated with ligaclips and divided. The cystic artery was identified, dissected circumferentially, ligated with ligaclips, and divided.  The gallbladder was dissected away from the gallbladder bed using the electrocautery for hemostasis. The gallbladder was completely removed from the liver and placed into an endocatch bag. The gallbladder was removed in the endocatch bag through the umbilical port site and submitted to pathology for review.  The right upper quadrant was irrigated and the gallbladder bed was inspected. Hemostasis was achieved with the electrocautery.  Cannulae were removed under direct vision and good hemostasis was noted. Pneumoperitoneum was released and the majority of the carbon dioxide evacuated. The umbilical wound was irrigated and the fascia was then closed with the  pursestring suture.  Local anesthetic was  infiltrated at all port sites. Skin incisions were closed with 4-0 Monocril subcuticular sutures and Dermabond was applied.  Instrument, sponge, and needle counts were correct at the conclusion of the case.  The patient was awakened from anesthesia and brought to the recovery room in stable condition.  The patient tolerated the procedure well.   [name], MD [facility] Office: [phone]

## 2021-12-31 ENCOUNTER — Encounter (HOSPITAL_COMMUNITY): Payer: Self-pay | Admitting: Surgery

## 2021-12-31 DIAGNOSIS — K8020 Calculus of gallbladder without cholecystitis without obstruction: Secondary | ICD-10-CM | POA: Diagnosis not present

## 2021-12-31 LAB — COMPREHENSIVE METABOLIC PANEL
ALT: 53 U/L — ABNORMAL HIGH (ref 0–44)
AST: 57 U/L — ABNORMAL HIGH (ref 15–41)
Albumin: 2.1 g/dL — ABNORMAL LOW (ref 3.5–5.0)
Alkaline Phosphatase: 224 U/L — ABNORMAL HIGH (ref 38–126)
Anion gap: 4 — ABNORMAL LOW (ref 5–15)
BUN: 21 mg/dL — ABNORMAL HIGH (ref 6–20)
CO2: 21 mmol/L — ABNORMAL LOW (ref 22–32)
Calcium: 8.8 mg/dL — ABNORMAL LOW (ref 8.9–10.3)
Chloride: 112 mmol/L — ABNORMAL HIGH (ref 98–111)
Creatinine, Ser: 1.35 mg/dL — ABNORMAL HIGH (ref 0.61–1.24)
GFR, Estimated: >60 mL/min (ref >60)
Glucose, Bld: 103 mg/dL — ABNORMAL HIGH (ref 70–99)
Potassium: 4 mmol/L (ref 3.5–5.1)
Sodium: 137 mmol/L (ref 135–145)
Total Bilirubin: 0.9 mg/dL (ref 0.3–1.2)
Total Protein: 9.5 g/dL — ABNORMAL HIGH (ref 6.5–8.1)

## 2021-12-31 LAB — MAGNESIUM: Magnesium: 2.1 mg/dL (ref 1.7–2.4)

## 2021-12-31 LAB — CBC
HCT: 31.9 % — ABNORMAL LOW (ref 39.0–52.0)
Hemoglobin: 10.8 g/dL — ABNORMAL LOW (ref 13.0–17.0)
MCH: 30.3 pg (ref 26.0–34.0)
MCHC: 33.9 g/dL (ref 30.0–36.0)
MCV: 89.4 fL (ref 80.0–100.0)
Platelets: 237 10*3/uL (ref 150–400)
RBC: 3.57 MIL/uL — ABNORMAL LOW (ref 4.22–5.81)
RDW: 13.2 % (ref 11.5–15.5)
WBC: 6.2 10*3/uL (ref 4.0–10.5)
nRBC: 0 % (ref 0.0–0.2)

## 2021-12-31 MED ORDER — OXYCODONE HCL 5 MG PO TABS: 5.0000 mg | ORAL_TABLET | Freq: Four times a day (QID) | ORAL | 0 refills | Status: DC | PRN

## 2021-12-31 NOTE — Discharge Summary (Signed)
Physician Discharge Summary  Lee [name] MIW:[id number] DOB: [birth year] DOA: 12/28/2021  PCP: [name], NP  Admit date: 12/28/2021 Discharge date: 12/31/2021  Admitted From: [facility] Disposition:  Home  Recommendations for Outpatient Follow-up:  Follow up with PCP in 1-2 weeks Please obtain CBC and CMP on follow-up visit Follow-up with general surgery as a scheduled in 2 weeks  Home Health: None Equipment/Devices: None Discharge Condition: Stable CODE STATUS: Full code Diet recommendation: Regular diet  Brief/Interim Summary: 41 year old male with HIV disease, well controlled on [name], comes into the hospital with several weeks of generalized abdominal discomfort, worse in the last 2 to 3 days.  Reports abdominal pain tends to get worse after eating heavy foods.  On admission, he was found to have elevated LFTs as well as borderline elevated lipase.  Right upper quadrant ultrasound shows cholelithiasis but no evidence of acute cholecystitis.  General surgery consulted and patient underwent laparoscopic cholecystectomy on 6/9.   Symptomatic cholelithiasis -S/p laparoscopic cholecystectomy on 6/9  -Patient tolerated procedure very well.  Liver enzymes: Improved -Advance diet to regular diet on 6/10 which he tolerated without any issues. -Cleared for discharge by general surgery.  Recommend to follow-up in 2 weeks. -Oxycodone sent to his pharmacy at discharge by general surgery  CKD 3A-baseline creatinine 1.3-1.6 -Stable  HIV infection-stable, follows with ID as an outpatient, continue home medications  Hyponatremia: Resolved  Essential hypertension: Remained stable.  Continued amlodipine at discharge  Discharge Diagnoses:  Symptomatic cholelithiasis CKD stage IIIa HIV infection Hyponatremia Essential hypertension   Discharge Instructions  Discharge Instructions     Diet - low sodium heart healthy   Complete by: As directed    Increase activity slowly    Complete by: As directed       Allergies as of 12/31/2021       Reactions   Gabapentin Nausea And Vomiting   Lisinopril Other (See Comments)   Dry cough    Nickel Rash        Medication List     STOP taking these medications    sulfamethoxazole-trimethoprim 800-160 MG tablet Commonly known as: BACTRIM DS       TAKE these medications    albuterol 108 (90 Base) MCG/ACT inhaler Commonly known as: VENTOLIN HFA Inhale 2 puffs into the lungs every 6 (six) hours as needed for wheezing or shortness of breath.   amLODipine 10 MG tablet Commonly known as: NORVASC Take 1 tablet (10 mg total) by mouth daily. What changed: when to take this   azelastine 0.1 % nasal spray Commonly known as: ASTELIN 1-2 puffs in each nostril twice daily   [name] 50-200-25 MG Tabs tablet Generic drug: bictegravir-emtricitabine-tenofovir AF TAKE 1 TABLET DAILY What changed: when to take this   Breo Ellipta 200-25 MCG/ACT Aepb Generic drug: fluticasone furoate-vilanterol Inhale 1 puff into the lungs daily.   EPINEPHrine 0.3 mg/0.3 mL Soaj injection Commonly known as: EPI-PEN Inject 0.3 mg into the muscle once.   montelukast 10 MG tablet Commonly known as: SINGULAIR Take 10 mg by mouth at bedtime.   oxyCODONE 5 MG immediate release tablet Commonly known as: Oxy IR/ROXICODONE Take 1-2 tablets (5-10 mg total) by mouth every 6 (six) hours as needed for moderate pain.   topiramate 50 MG tablet Commonly known as: TOPAMAX Take 50 mg by mouth at bedtime.   Vitamin D (Ergocalciferol) 1.25 MG (50000 UNIT) Caps capsule Commonly known as: DRISDOL Take 50,000 Units by mouth once a week. Friday  Follow-up Information     [facility] [place] Go on 01/26/2022.   Specialty: General Surgery Why: Follow up with [name], PA-C in the office at 1:30 PM. Please arrive 30 min prior to appointment time and have ID and insurance card with you. Contact information: [address] [place] [address] [phone]         [name], NP Follow up in 1 week(s).   Specialty: Family Medicine Contact information: [place] [address] [phone]                Allergies  Allergen Reactions   Gabapentin Nausea And Vomiting   Lisinopril Other (See Comments)    Dry cough     Nickel Rash    Consultations: General surgery   Procedures/Studies: DG Cholangiogram Operative  Result Date: 12/30/2021 CLINICAL DATA:  Cholecystectomy for symptomatic cholelithiasis. EXAM: INTRAOPERATIVE CHOLANGIOGRAM TECHNIQUE: Cholangiographic images from the C-arm fluoroscopic device were submitted for interpretation post-operatively. Please see the procedural report for the amount of contrast and the fluoroscopy time utilized. FLUOROSCOPY: Radiation Exposure Index (as provided by the fluoroscopic device): 11.2 mGy Kerma COMPARISON:  Right upper quadrant ultrasound on 12/28/2021 FINDINGS: Intraoperative imaging with a C-arm demonstrates normal opacified cystic duct stump, common bile duct and visualized intrahepatic ducts. No evidence of biliary dilatation, stricture, filling defect or contrast extravasation. Contrast enters the duodenum normally. IMPRESSION: Normal intraoperative cholangiogram. Electronically Signed   By: Aletta Edouard M.D.   On: 12/30/2021 09:12   US Abdomen Limited RUQ (LIVER/GB)  Result Date: 12/28/2021 CLINICAL DATA:  Pancreatitis. EXAM: ULTRASOUND ABDOMEN LIMITED RIGHT UPPER QUADRANT COMPARISON:  Abdominal ultrasound 02/23/2017. CT abdomen and pelvis 04/06/2020 FINDINGS: Gallbladder: Small gallstones are present. Gallbladder polyp is present measuring 4 mm. No gallbladder wall thickening or pericholecystic fluid. No sonographic Murphy sign noted by sonographer. Common bile duct: Diameter: 4.1 mm Liver: No focal lesion identified. Within normal limits in parenchymal echogenicity. Portal vein is patent on color Doppler  imaging with normal direction of blood flow towards the liver. Other: None. IMPRESSION: 1. Cholelithiasis. No additional sonographic evidence for acute cholecystitis. 2. 4 mm gallbladder polyp.  No follow-up necessary. Electronically Signed   By: Ronney Asters M.D.   On: 12/28/2021 22:42   DG Ribs Unilateral W/Chest Right  Result Date: 12/28/2021 CLINICAL DATA:  right rib pain with vomiting EXAM: RIGHT RIBS AND CHEST - 3+ VIEW COMPARISON:  Chest x-ray 07/14/2021 FINDINGS: The heart and mediastinal contours are within normal limits. No focal consolidation. No pulmonary edema. No pleural effusion. No pneumothorax. Nipple markers noted bilaterally. No acute displaced fracture or other bone lesions are seen involving the ribs. IMPRESSION: 1. No acute displaced right rib fractures. Please note, nondisplaced rib fractures may be occult on radiograph. 2. No acute cardiopulmonary abnormality. Electronically Signed   By: Iven Finn M.D.   On: 12/28/2021 19:12      Subjective: Patient seen and examined.  Resting comfortably on the bed.  Reports some soreness in abdomen.  No nausea, vomiting.  No acute events overnight.  No fever, chills.  His diet was advanced to regular diet and he tolerated very well without any issues.  Comfortable going home today.  Discharge Exam: Vitals:   12/31/21 0202 12/31/21 0542  BP: 117/86 116/75  Pulse: 80 62  Resp: 18 18  Temp: 97.6 F (36.4 C) 97.9 F (36.6 C)  SpO2: 95% 93%   Vitals:   12/30/21 1825 12/30/21 2102 12/31/21 0202 12/31/21  0542  BP: (!) 110/58 114/80 117/86 116/75  Pulse: 66 69 80 62  Resp: '18 18 18 18  '$ Temp:  98 F (36.7 C) 97.6 F (36.4 C) 97.9 F (36.6 C)  TempSrc:  Oral Oral Oral  SpO2: 98% 96% 95% 93%  Weight:      Height:        General: Pt is sleepy but arousable, not in acute distress Cardiovascular: RRR, S1/S2 +, no rubs, no gallops Respiratory: CTA bilaterally, no wheezing, no rhonchi Abdominal: Soft, wounds dry and intact  with Dermabond.  Some generalized abdominal tenderness positive.  No guarding, no rigidity.   Extremities: no edema, no cyanosis    The results of significant diagnostics from this hospitalization (including imaging, microbiology, ancillary and laboratory) are listed below for reference.     Microbiology: Recent Results (from the past 240 hour(s))  Surgical pcr screen     Status: None   Collection Time: 12/29/21  1:06 PM   Specimen: Nasal Mucosa; Nasal Swab  Result Value Ref Range Status   MRSA, PCR NEGATIVE NEGATIVE Final   Staphylococcus aureus NEGATIVE NEGATIVE Final    Comment: (NOTE) The Xpert SA Assay (FDA approved for NASAL specimens in patients 56 years of age and older), is one component of a comprehensive surveillance program. It is not intended to diagnose infection nor to guide or monitor treatment. Performed at [facility], [place] [address] [place], [place] [address]      Labs: BNP (last 3 results) Recent Labs    07/14/21 0419  BNP 80.9   Basic Metabolic Panel: Recent Labs  Lab 12/28/21 2107 12/29/21 0921 12/30/21 0425 12/31/21 0521  NA 130* 133* 132* 137  K 3.7 4.2 4.3 4.0  CL 106 109 108 112*  CO2 17* 18* 18* 21*  GLUCOSE 91 79 80 103*  BUN '15 17 17 '$ 21*  CREATININE 1.67* 1.43* 1.46* 1.35*  CALCIUM 8.5* 8.4* 8.5* 8.8*  MG  --   --  1.9 2.1   Liver Function Tests: Recent Labs  Lab 12/28/21 2107 12/29/21 0921 12/30/21 0425 12/31/21 0521  AST 71* 66* 63* 57*  ALT 62* 62* 56* 53*  ALKPHOS 251* 250* 239* 224*  BILITOT 1.3* 1.6* 1.5* 0.9  PROT 10.7* 10.2* 9.9* 9.5*  ALBUMIN 2.3* 2.2* 2.3* 2.1*   Recent Labs  Lab 12/28/21 2107  LIPASE 54*   No results for input(s): "AMMONIA" in the last 168 hours. CBC: Recent Labs  Lab 12/28/21 2107 12/30/21 0425 12/31/21 0521  WBC 6.8 5.2 6.2  NEUTROABS 5.0  --   --   HGB 12.2* 11.9* 10.8*  HCT 34.9* 35.6* 31.9*  MCV 85.7 92.0 89.4  PLT 254 242 237   Cardiac Enzymes: No  results for input(s): "CKTOTAL", "CKMB", "CKMBINDEX", "TROPONINI" in the last 168 hours. BNP: Invalid input(s): "POCBNP" CBG: No results for input(s): "GLUCAP" in the last 168 hours. D-Dimer Recent Labs    12/28/21 2107  DDIMER 0.48   Hgb A1c No results for input(s): "HGBA1C" in the last 72 hours. Lipid Profile No results for input(s): "CHOL", "HDL", "LDLCALC", "TRIG", "CHOLHDL", "LDLDIRECT" in the last 72 hours. Thyroid function studies No results for input(s): "TSH", "T4TOTAL", "T3FREE", "THYROIDAB" in the last 72 hours.  Invalid input(s): "FREET3" Anemia work up No results for input(s): "VITAMINB12", "FOLATE", "FERRITIN", "TIBC", "IRON", "RETICCTPCT" in the last 72 hours. Urinalysis    Component Value Date/Time   COLORURINE YELLOW 03/12/2020 1234   APPEARANCEUR CLEAR 03/12/2020 1234   LABSPEC  1.015 03/12/2020 1234   PHURINE 7.0 03/12/2020 1234   GLUCOSEU NEGATIVE 03/12/2020 1234   HGBUR MODERATE (A) 03/12/2020 1234   BILIRUBINUR NEGATIVE 03/12/2020 1234   KETONESUR NEGATIVE 03/12/2020 1234   PROTEINUR 30 (A) 03/12/2020 1234   UROBILINOGEN 0.2 12/12/2009 1811   NITRITE NEGATIVE 03/12/2020 1234   LEUKOCYTESUR NEGATIVE 03/12/2020 1234   Sepsis Labs Recent Labs  Lab 12/28/21 2107 12/30/21 0425 12/31/21 0521  WBC 6.8 5.2 6.2   Microbiology Recent Results (from the past 240 hour(s))  Surgical pcr screen     Status: None   Collection Time: 12/29/21  1:06 PM   Specimen: Nasal Mucosa; Nasal Swab  Result Value Ref Range Status   MRSA, PCR NEGATIVE NEGATIVE Final   Staphylococcus aureus NEGATIVE NEGATIVE Final    Comment: (NOTE) The Xpert SA Assay (FDA approved for NASAL specimens in patients 47 years of age and older), is one component of a comprehensive surveillance program. It is not intended to diagnose infection nor to guide or monitor treatment. Performed at [facility], [place] [address], [place], [place] [address]      Time coordinating  discharge: Over 30 minutes  SIGNED:   Mckinley Jewel, MD  [facility] 12/31/2021, 12:57 PM Pager   If 7PM-7AM, please contact night-coverage [email or web address]

## 2021-12-31 NOTE — Progress Notes (Signed)
Assessment & Plan: POD#1 - status post lap cholecystectomy with cholangiography  Advance to regular diet this AM  Rx for oxycodone sent to pharmacy at discharge  Likely home today if tolerates diet  Follow up arranged at [place] office 2 weeks        [name], MD [place] practice Office: [phone]        Chief Complaint: Biliary colic, cholelithiasis  Subjective: Patient in bed, comfortable, mild pain.  Taking limited liquids.  Ambulating.  Objective: Vital signs in last 24 hours: Temp:  [97.5 F (36.4 C)-98.7 F (37.1 C)] 97.9 F (36.6 C) (06/10 0542) Pulse Rate:  [62-100] 62 (06/10 0542) Resp:  [14-18] 18 (06/10 0542) BP: (110-130)/(58-86) 116/75 (06/10 0542) SpO2:  [93 %-100 %] 93 % (06/10 0542) Last BM Date : 12/29/21  Intake/Output from previous day: 06/09 0701 - 06/10 0700 In: 3259.4 [P.O.:780; I.V.:2479.4] Out: 2615 [Urine:2600; Blood:15] Intake/Output this shift: No intake/output data recorded.  Physical Exam: HEENT - sclerae clear, mucous membranes moist Neck - soft Abdomen - soft, wounds dry and intact with Dermabond; mild tenderness epigastrium Ext - no edema, non-tender Neuro - alert & oriented, no focal deficits  Lab Results:  Recent Labs    12/30/21 0425 12/31/21 0521  WBC 5.2 6.2  HGB 11.9* 10.8*  HCT 35.6* 31.9*  PLT 242 237   BMET Recent Labs    12/30/21 0425 12/31/21 0521  NA 132* 137  K 4.3 4.0  CL 108 112*  CO2 18* 21*  GLUCOSE 80 103*  BUN 17 21*  CREATININE 1.46* 1.35*  CALCIUM 8.5* 8.8*   PT/INR No results for input(s): "LABPROT", "INR" in the last 72 hours. Comprehensive Metabolic Panel:    Component Value Date/Time   NA 137 12/31/2021 0521   NA 132 (L) 12/30/2021 0425   K 4.0 12/31/2021 0521   K 4.3 12/30/2021 0425   CL 112 (H) 12/31/2021 0521   CL 108 12/30/2021 0425   CO2 21 (L) 12/31/2021 0521   CO2 18 (L) 12/30/2021 0425   BUN 21 (H) 12/31/2021 0521   BUN 17 12/30/2021  0425   CREATININE 1.35 (H) 12/31/2021 0521   CREATININE 1.46 (H) 12/30/2021 0425   CREATININE 1.44 (H) 05/26/2021 1627   CREATININE 1.83 (H) 12/05/2019 1203   GLUCOSE 103 (H) 12/31/2021 0521   GLUCOSE 80 12/30/2021 0425   CALCIUM 8.8 (L) 12/31/2021 0521   CALCIUM 8.5 (L) 12/30/2021 0425   AST 57 (H) 12/31/2021 0521   AST 63 (H) 12/30/2021 0425   ALT 53 (H) 12/31/2021 0521   ALT 56 (H) 12/30/2021 0425   ALKPHOS 224 (H) 12/31/2021 0521   ALKPHOS 239 (H) 12/30/2021 0425   BILITOT 0.9 12/31/2021 0521   BILITOT 1.5 (H) 12/30/2021 0425   PROT 9.5 (H) 12/31/2021 0521   PROT 9.9 (H) 12/30/2021 0425   ALBUMIN 2.1 (L) 12/31/2021 0521   ALBUMIN 2.3 (L) 12/30/2021 0425    Studies/Results: DG Cholangiogram Operative  Result Date: 12/30/2021 CLINICAL DATA:  Cholecystectomy for symptomatic cholelithiasis. EXAM: INTRAOPERATIVE CHOLANGIOGRAM TECHNIQUE: Cholangiographic images from the C-arm fluoroscopic device were submitted for interpretation post-operatively. Please see the procedural report for the amount of contrast and the fluoroscopy time utilized. FLUOROSCOPY: Radiation Exposure Index (as provided by the fluoroscopic device): 11.2 mGy Kerma COMPARISON:  Right upper quadrant ultrasound on 12/28/2021 FINDINGS: Intraoperative imaging with a C-arm demonstrates normal opacified cystic duct stump, common bile duct and visualized intrahepatic ducts. No evidence of biliary  dilatation, stricture, filling defect or contrast extravasation. Contrast enters the duodenum normally. IMPRESSION: Normal intraoperative cholangiogram. Electronically Signed   By: Aletta Edouard M.D.   On: 12/30/2021 09:12      [name] 12/31/2021   Patient ID: Lee [name], male   DOB: January 21, 1981, 41 y.o.   MRN: [id number]

## 2021-12-31 NOTE — TOC Transition Note (Signed)
Transition of Care [facility]) - CM/SW Discharge Note   Patient Details  Name: Lee [name] MRN: [id number] Date of Birth: [birth year]  Transition of Care [facility]) CM/SW Contact:  Leeroy Cha, RN Phone Number: 12/31/2021, 1:19 PM   Clinical Narrative:    Dcd to home with self care. Chart reviewed toc needs present.     Barriers to Discharge: No Barriers Identified   Patient Goals and CMS Choice        Discharge Placement                       Discharge Plan and Services                                     Social Determinants of Health (SDOH) Interventions     Readmission Risk Interventions     No data to display

## 2022-01-02 ENCOUNTER — Telehealth: Payer: Self-pay

## 2022-01-02 LAB — SURGICAL PATHOLOGY

## 2022-01-02 NOTE — Telephone Encounter (Signed)
Pt last OV 09/14/17. Will check with provider to see if he will accept him back as new patient.

## 2022-01-02 NOTE — Telephone Encounter (Signed)
Transition Care Management Follow-up Telephone Call Date of discharge and from where: [name] 12/28/21-12/31/21 How have you been since you were released from the hospital? "I am in a lot of pain, the medication does not seem strong enough.  I was also told I can go back to work today and there is no way I can due to pain." Provided patient the phone number for [facility] and encouraged him to call and discuss with MD about pain level and return to work date. Any questions or concerns? Yes- Pain and Return to work  Items Reviewed: Did the pt receive and understand the discharge instructions provided? Yes  Medications obtained and verified? Yes  Other? No  Any new allergies since your discharge? No  Dietary orders reviewed? No Do you have support at home? Yes   Home Care and Equipment/Supplies: Were home health services ordered? no If so, what is the name of the agency? N/A  Has the agency set up a time to come to the patient's home? not applicable Were any new equipment or medical supplies ordered?  No What is the name of the medical supply agency? N/A Were you able to get the supplies/equipment? not applicable Do you have any questions related to the use of the equipment or supplies? No  Functional Questionnaire: (I = Independent and D = Dependent) ADLs: I  Bathing/Dressing- I  Meal Prep- I  Eating- I  Maintaining continence- I  Transferring/Ambulation- I  Managing Meds- I  Follow up appointments reviewed:  PCP Hospital f/u appt confirmed? No  Pending response from PCP about appt. Since has not seen patient since 2019. [facility] f/u appt confirmed? Yes  Scheduled to see [facility], Puja Maczis on 01/26/22 @ 1:30.. Are transportation arrangements needed? No  If their condition worsens, is the pt aware to call PCP or go to the Emergency Dept.? Yes Was the patient provided with contact information for the PCP's office or ED? Yes Was to pt encouraged to  call back with questions or concerns? Yes- Patient to call [facility], number provided. Johnney Killian, RN, BSN, CCM Care Management Coordinator Phone: [phone]: [phone]

## 2022-01-03 NOTE — Telephone Encounter (Signed)
LVM for pt to return call to schedule appt with PCP.  Pt needs hosp f/u with [facility] ASAP.  Pt will also need a separate CPE appt

## 2022-01-04 DIAGNOSIS — K5900 Constipation, unspecified: Secondary | ICD-10-CM | POA: Diagnosis not present

## 2022-01-04 DIAGNOSIS — Z9049 Acquired absence of other specified parts of digestive tract: Secondary | ICD-10-CM | POA: Diagnosis not present

## 2022-01-04 DIAGNOSIS — Z09 Encounter for follow-up examination after completed treatment for conditions other than malignant neoplasm: Secondary | ICD-10-CM | POA: Diagnosis not present

## 2022-01-06 ENCOUNTER — Ambulatory Visit (INDEPENDENT_AMBULATORY_CARE_PROVIDER_SITE_OTHER): Payer: BC Managed Care – PPO | Admitting: Adult Health

## 2022-01-06 ENCOUNTER — Encounter: Payer: Self-pay | Admitting: Adult Health

## 2022-01-06 VITALS — BP 120/68 | HR 38 | Temp 99.3°F | Ht 76.0 in | Wt 220.0 lb

## 2022-01-06 DIAGNOSIS — K819 Cholecystitis, unspecified: Secondary | ICD-10-CM

## 2022-01-06 DIAGNOSIS — I10 Essential (primary) hypertension: Secondary | ICD-10-CM | POA: Diagnosis not present

## 2022-01-06 DIAGNOSIS — B20 Human immunodeficiency virus [HIV] disease: Secondary | ICD-10-CM | POA: Diagnosis not present

## 2022-01-06 DIAGNOSIS — Z7689 Persons encountering health services in other specified circumstances: Secondary | ICD-10-CM | POA: Diagnosis not present

## 2022-01-06 DIAGNOSIS — J309 Allergic rhinitis, unspecified: Secondary | ICD-10-CM

## 2022-01-06 NOTE — Progress Notes (Signed)
Patient presents to clinic today to reestablish care.  Acute Concerns: Reestablish Care  Chronic Issues: HIV - diagnosed in January 2018 - currently maintained on [name]. He is seen by ID on a routine basis. He has not had any missed doses.   Seasonal Allergies - is seen by Allergy and asthma - Using Singulair and Breo Ellipta. He feels well controlled.   HTN - managed with Norvasc 10 mg. He denies dizziness, lightheadedness, blurred vision,chest pain or SOB BP Readings from Last 3 Encounters:  01/06/22 120/68  12/31/21 116/75  07/14/21 104/72   Laparoscopic Cholecystomy-performed on 12/30/2021.  He has been recovering well but continues to have significant pain but this is improving daily. 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   Shingles     Past Surgical History:  Procedure Laterality Date   CHOLECYSTECTOMY N/A 12/30/2021   Procedure: LAPAROSCOPIC CHOLECYSTECTOMY WITH  CHOLANGIOGRAM;  Surgeon: [name], MD;  Location: [facility];  Service: General;  Laterality: N/A;   LAPAROSCOPIC APPENDECTOMY N/A 03/12/2020   Procedure: APPENDECTOMY LAPAROSCOPIC;  Surgeon: Leighton Ruff, MD;  Location: [facility];  Service: General;  Laterality: N/A;   TYMPANOSTOMY TUBE PLACEMENT     WISDOM TOOTH EXTRACTION     WRIST SURGERY      Current Outpatient Medications on File Prior to Visit  Medication Sig Dispense Refill   albuterol (PROVENTIL HFA;VENTOLIN HFA) 108 (90 Base) MCG/ACT inhaler Inhale 2 puffs into the lungs every 6 (six) hours as needed for wheezing or shortness of breath. 1 Inhaler 1   amLODipine (NORVASC) 10 MG tablet  Take 1 tablet (10 mg total) by mouth daily. (Patient taking differently: Take 10 mg by mouth at bedtime.) 30 tablet 0   azelastine (ASTELIN) 0.1 % nasal spray 1-2 puffs in each nostril twice daily     [name] 50-200-25 MG TABS tablet TAKE 1 TABLET DAILY (Patient taking differently: Take 1 tablet by mouth at bedtime.) 30 tablet 8   BREO ELLIPTA 200-25 MCG/ACT AEPB Inhale 1 puff into the lungs daily.     EPINEPHrine 0.3 mg/0.3 mL IJ SOAJ injection Inject 0.3 mg into the muscle once.     montelukast (SINGULAIR) 10 MG tablet Take 10 mg by mouth at bedtime.     oxyCODONE (OXY IR/ROXICODONE) 5 MG immediate release tablet Take 1-2 tablets (5-10 mg total) by mouth every 6 (six) hours as needed for moderate pain. 20 tablet 0   topiramate (TOPAMAX) 50 MG tablet Take 50 mg by mouth at bedtime.     Vitamin D, Ergocalciferol, (DRISDOL) 1.25 MG (50000 UT) CAPS capsule Take 50,000 Units by mouth once a week. Friday     No current facility-administered medications on file prior to visit.    Allergies  Allergen Reactions   Gabapentin Nausea And Vomiting   Lisinopril Other (See Comments)    Dry cough     Nickel Rash    Family History  Problem Relation Age of Onset   Hypercalcemia Mother    Hypertension Mother    Hypertension Father     Social History   Socioeconomic  History   Marital status: Single    Spouse name: Not on file   Number of children: Not on file   Years of education: Not on file   Highest education level: Not on file  Occupational History   Not on file  Tobacco Use   Smoking status: Never   Smokeless tobacco: Never  Vaping Use   Vaping Use: Never used  Substance and Sexual Activity   Alcohol use: No   Drug use: No   Sexual activity: Not Currently    Partners: Male    Birth control/protection: Condom    Comment: Given Condoms  Other Topics Concern   Not on file  Social History Narrative   Works as Nurse, 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   Social Determinants of Systems developer Strain: Not on file  Food Insecurity: Not on file  Transportation Needs: Not on file  Physical Activity: Not on file  Stress: Not on file  Social Connections: Not on file  Intimate Partner Violence: Not on file    Review of Systems  Constitutional: Negative.   HENT: Negative.    Eyes: Negative.   Respiratory: Negative.    Cardiovascular: Negative.   Gastrointestinal:  Positive for abdominal pain.  Musculoskeletal: Negative.   Skin: Negative.   Neurological: Negative.   Endo/Heme/Allergies: Negative.   Psychiatric/Behavioral: Negative.      BP 120/68   Pulse (!) 38   Temp 99.3 F (37.4 C) (Oral)   Ht '6\' 4"'$  (1.93 m)   Wt 220 lb (99.8 kg)   SpO2 98%   BMI 26.78 kg/m   Physical Exam Vitals and nursing note reviewed.  Constitutional:      Appearance: Normal appearance.  Cardiovascular:     Rate and Rhythm: Normal rate and regular rhythm.     Pulses: Normal pulses.     Heart sounds: Normal heart sounds.  Pulmonary:     Effort: Pulmonary effort is normal.     Breath sounds: Normal breath sounds.  Abdominal:     General: Abdomen is flat.     Palpations: Abdomen is soft.     Comments: Well healing laparoscopic surgery wounds   Musculoskeletal:        General: Normal range of motion.  Skin:    General: Skin is warm and dry.  Neurological:     General: No focal deficit present.     Mental Status: He is alert and oriented to person, place, and time.  Psychiatric:        Mood and Affect: Mood normal.        Behavior: Behavior normal.        Thought Content: Thought content normal.        Judgment: Judgment normal.     Recent Results (from the past 2160 hour(s))  Comprehensive metabolic panel     Status: Abnormal   Collection Time: 12/28/21  9:07 PM  Result Value Ref Range   Sodium 130 (L) 135 - 145 mmol/L   Potassium 3.7 3.5 - 5.1 mmol/L   Chloride 106 98 - 111 mmol/L   CO2 17 (L) 22 - 32 mmol/L   Glucose, Bld 91 70 - 99 mg/dL    Comment: Glucose  reference range applies only to samples taken after fasting for at least 8 hours.   BUN 15 6 - 20 mg/dL   Creatinine, Ser 1.67 (H) 0.61 - 1.24 mg/dL   Calcium 8.5 (L) 8.9 - 10.3 mg/dL   Total  Protein 10.7 (H) 6.5 - 8.1 g/dL   Albumin 2.3 (L) 3.5 - 5.0 g/dL   AST 71 (H) 15 - 41 U/L   ALT 62 (H) 0 - 44 U/L   Alkaline Phosphatase 251 (H) 38 - 126 U/L   Total Bilirubin 1.3 (H) 0.3 - 1.2 mg/dL   GFR, Estimated 53 (L) >60 mL/min    Comment: (NOTE) Calculated using the CKD-EPI Creatinine Equation (2021)    Anion gap 7 5 - 15    Comment: Performed at [facility], [address] [place], [place] [address]  CBC with Differential     Status: Abnormal   Collection Time: 12/28/21  9:07 PM  Result Value Ref Range   WBC 6.8 4.0 - 10.5 K/uL   RBC 4.07 (L) 4.22 - 5.81 MIL/uL   Hemoglobin 12.2 (L) 13.0 - 17.0 g/dL   HCT 34.9 (L) 39.0 - 52.0 %   MCV 85.7 80.0 - 100.0 fL   MCH 30.0 26.0 - 34.0 pg   MCHC 35.0 30.0 - 36.0 g/dL   RDW 13.6 11.5 - 15.5 %   Platelets 254 150 - 400 K/uL   nRBC 0.0 0.0 - 0.2 %   Neutrophils Relative % 73 %   Neutro Abs 5.0 1.7 - 7.7 K/uL   Lymphocytes Relative 20 %   Lymphs Abs 1.3 0.7 - 4.0 K/uL   Monocytes Relative 7 %   Monocytes Absolute 0.5 0.1 - 1.0 K/uL   Eosinophils Relative 0 %   Eosinophils Absolute 0.0 0.0 - 0.5 K/uL   Basophils Relative 0 %   Basophils Absolute 0.0 0.0 - 0.1 K/uL   Immature Granulocytes 0 %   Abs Immature Granulocytes 0.02 0.00 - 0.07 K/uL    Comment: Performed at [facility], [address] [place], [place] [address]  D-dimer, quantitative     Status: None   Collection Time: 12/28/21  9:07 PM  Result Value Ref Range   D-Dimer, Quant 0.48 0.00 - 0.50 ug/mL-FEU    Comment: (NOTE) At the manufacturer cut-off value of 0.5 g/mL FEU, this assay has a negative predictive value of 95-100%.This assay is intended for use in conjunction with a clinical pretest probability (PTP) assessment model to exclude pulmonary  embolism (PE) and deep venous thrombosis (DVT) in outpatients suspected of PE or DVT. Results should be correlated with clinical presentation. Performed at [facility], [address] [place], [place] [address]   Troponin I (High Sensitivity)     Status: None   Collection Time: 12/28/21  9:07 PM  Result Value Ref Range   Troponin I (High Sensitivity) 4 <18 ng/L    Comment: (NOTE) Elevated high sensitivity troponin I (hsTnI) values and significant  changes across serial measurements may suggest ACS but many other  chronic and acute conditions are known to elevate hsTnI results.  Refer to the "Links" section for chest pain algorithms and additional  guidance. Performed at [facility], [address] [place], [place] [address]   Lipase, blood     Status: Abnormal   Collection Time: 12/28/21  9:07 PM  Result Value Ref Range   Lipase 54 (H) 11 - 51 U/L    Comment: Performed at [facility], [address] [place], [place] [address]  Comprehensive metabolic panel     Status: Abnormal   Collection Time: 12/29/21  9:21 AM  Result Value Ref Range   Sodium 133 (  L) 135 - 145 mmol/L   Potassium 4.2 3.5 - 5.1 mmol/L   Chloride 109 98 - 111 mmol/L   CO2 18 (L) 22 - 32 mmol/L   Glucose, Bld 79 70 - 99 mg/dL    Comment: Glucose reference range applies only to samples taken after fasting for at least 8 hours.   BUN 17 6 - 20 mg/dL   Creatinine, Ser 1.43 (H) 0.61 - 1.24 mg/dL   Calcium 8.4 (L) 8.9 - 10.3 mg/dL   Total Protein 10.2 (H) 6.5 - 8.1 g/dL   Albumin 2.2 (L) 3.5 - 5.0 g/dL   AST 66 (H) 15 - 41 U/L   ALT 62 (H) 0 - 44 U/L   Alkaline Phosphatase 250 (H) 38 - 126 U/L   Total Bilirubin 1.6 (H) 0.3 - 1.2 mg/dL   GFR, Estimated >60 >60 mL/min    Comment: (NOTE) Calculated using the CKD-EPI Creatinine Equation (2021)    Anion gap 6 5 - 15    Comment: Performed at [facility], [place] [address], [place], [place] [address]  Surgical  pcr screen     Status: None   Collection Time: 12/29/21  1:06 PM   Specimen: Nasal Mucosa; Nasal Swab  Result Value Ref Range   MRSA, PCR NEGATIVE NEGATIVE   Staphylococcus aureus NEGATIVE NEGATIVE    Comment: (NOTE) The Xpert SA Assay (FDA approved for NASAL specimens in patients 81 years of age and older), is one component of a comprehensive surveillance program. It is not intended to diagnose infection nor to guide or monitor treatment. Performed at [facility], [place] [address], [place], [place] [address]   Comprehensive metabolic panel     Status: Abnormal   Collection Time: 12/30/21  4:25 AM  Result Value Ref Range   Sodium 132 (L) 135 - 145 mmol/L   Potassium 4.3 3.5 - 5.1 mmol/L   Chloride 108 98 - 111 mmol/L   CO2 18 (L) 22 - 32 mmol/L   Glucose, Bld 80 70 - 99 mg/dL    Comment: Glucose reference range applies only to samples taken after fasting for at least 8 hours.   BUN 17 6 - 20 mg/dL   Creatinine, Ser 1.46 (H) 0.61 - 1.24 mg/dL   Calcium 8.5 (L) 8.9 - 10.3 mg/dL   Total Protein 9.9 (H) 6.5 - 8.1 g/dL   Albumin 2.3 (L) 3.5 - 5.0 g/dL   AST 63 (H) 15 - 41 U/L   ALT 56 (H) 0 - 44 U/L   Alkaline Phosphatase 239 (H) 38 - 126 U/L   Total Bilirubin 1.5 (H) 0.3 - 1.2 mg/dL   GFR, Estimated >60 >60 mL/min    Comment: (NOTE) Calculated using the CKD-EPI Creatinine Equation (2021)    Anion gap 6 5 - 15    Comment: Performed at [facility], [place] [address] [place], [place] [address]  CBC     Status: Abnormal   Collection Time: 12/30/21  4:25 AM  Result Value Ref Range   WBC 5.2 4.0 - 10.5 K/uL   RBC 3.87 (L) 4.22 - 5.81 MIL/uL   Hemoglobin 11.9 (L) 13.0 - 17.0 g/dL   HCT 35.6 (L) 39.0 - 52.0 %   MCV 92.0 80.0 - 100.0 fL   MCH 30.7 26.0 - 34.0 pg   MCHC 33.4 30.0 - 36.0 g/dL   RDW 13.5 11.5 - 15.5 %   Platelets 242 150 - 400 K/uL   nRBC  0.0 0.0 - 0.2 %    Comment: Performed at [facility], [place] [address], [place], [place] [address]  Magnesium     Status: None   Collection Time: 12/30/21  4:25 AM  Result Value Ref Range   Magnesium 1.9 1.7 - 2.4 mg/dL    Comment: Performed at [facility], [place] [address] [place], [place] [address]  Surgical pathology     Status: None   Collection Time: 12/30/21  8:13 AM  Result Value Ref Range   SURGICAL PATHOLOGY      SURGICAL PATHOLOGY CASE: [id number] PATIENT: Shelbie Hutching Surgical Pathology Report     Clinical History: Acute cholecystitis (crm)     FINAL MICROSCOPIC DIAGNOSIS:  A. GALLBLADDER, CHOLECYSTECTOMY: Chronic cholecystitis and cholelithiasis. Negative for neoplasm.   GROSS DESCRIPTION:  A. Specimen: received fresh and subsequently placed in formalin labeled with the patient's name and "Gallbladder" is a gallbladder. Integrity/Size: intact, fluctuant, 10.8 x up to 2.5 cm. Serosal surface: red-tan and shaggy with adhesions. Mucosa/Wall: a 0.1 cm thick wall surrounds red-brown, velvety mucosa. Contents: a moderate amount of amber, viscous bile engrosses a few black, bosselated, friable calculi averaging 0.2 cm in greatest dimension. Cystic duct: received stapled and 0.2 cm in diameter.  An adjacent lymph node is not grossly identified. Block Summary: 1 block  (LEF 01/02/2022)   Final Diagnosis performed by Unknown Jim, MD.   Electronically signed 6/12 /2023 Technical component performed at [facility], [place] [address], [place], [place] [address].  Professional component performed at [employer]. [facility], [place] [address] [place], [place] [address].  Immunohistochemistry Technical component (if applicable) was performed at [facility]. [address] [place], [place], [place] [address].   IMMUNOHISTOCHEMISTRY DISCLAIMER (if applicable): Some of these immunohistochemical stains may have been developed and the performance characteristics determine by  [facility]. Some may not have been cleared or approved by the U.S. Food and Drug Administration. The FDA has determined that such clearance or approval is not necessary. This test is used for clinical purposes. It should not be regarded as investigational or for research. This laboratory is certified under the [place] (CLIA-88) as qualified to perform high complexity clini cal laboratory testing.  The controls stained appropriately.   Comprehensive metabolic panel     Status: Abnormal   Collection Time: 12/31/21  5:21 AM  Result Value Ref Range   Sodium 137 135 - 145 mmol/L   Potassium 4.0 3.5 - 5.1 mmol/L   Chloride 112 (H) 98 - 111 mmol/L   CO2 21 (L) 22 - 32 mmol/L   Glucose, Bld 103 (H) 70 - 99 mg/dL    Comment: Glucose reference range applies only to samples taken after fasting for at least 8 hours.   BUN 21 (H) 6 - 20 mg/dL   Creatinine, Ser 1.35 (H) 0.61 - 1.24 mg/dL   Calcium 8.8 (L) 8.9 - 10.3 mg/dL   Total Protein 9.5 (H) 6.5 - 8.1 g/dL   Albumin 2.1 (L) 3.5 - 5.0 g/dL   AST 57 (H) 15 - 41 U/L   ALT 53 (H) 0 - 44 U/L   Alkaline Phosphatase 224 (H) 38 - 126 U/L   Total Bilirubin 0.9 0.3 - 1.2 mg/dL   GFR, Estimated >60 >60 mL/min    Comment: (NOTE) Calculated using the CKD-EPI Creatinine Equation (2021)    Anion gap 4 (L) 5 - 15  Comment: Performed at [facility], [place] [address], [place], [place] [address]  CBC     Status: Abnormal   Collection Time: 12/31/21  5:21 AM  Result Value Ref Range   WBC 6.2 4.0 - 10.5 K/uL   RBC 3.57 (L) 4.22 - 5.81 MIL/uL   Hemoglobin 10.8 (L) 13.0 - 17.0 g/dL   HCT 31.9 (L) 39.0 - 52.0 %   MCV 89.4 80.0 - 100.0 fL   MCH 30.3 26.0 - 34.0 pg   MCHC 33.9 30.0 - 36.0 g/dL   RDW 13.2 11.5 - 15.5 %   Platelets 237 150 - 400 K/uL   nRBC 0.0 0.0 - 0.2 %    Comment: Performed at [facility], [place] [address] [place], [place] [address]   Magnesium     Status: None   Collection Time: 12/31/21  5:21 AM  Result Value Ref Range   Magnesium 2.1 1.7 - 2.4 mg/dL    Comment: Performed at [facility], [place] [address] [place], [place] [address]    Assessment/Plan: 1. Encounter to establish care - Follow up at end of month for CPE   2. Essential hypertension - controlled  3. 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[place]) - Per ID   4. Allergic rhinitis, unspecified seasonality, unspecified trigger - Per allergy and asthma   5. Cholecystitis - Normal healing  - Continue with pain management   [name], NP

## 2022-01-20 ENCOUNTER — Ambulatory Visit (INDEPENDENT_AMBULATORY_CARE_PROVIDER_SITE_OTHER): Payer: BC Managed Care – PPO

## 2022-01-20 ENCOUNTER — Encounter: Payer: Self-pay | Admitting: Adult Health

## 2022-01-20 ENCOUNTER — Telehealth: Payer: Self-pay | Admitting: Adult Health

## 2022-01-20 ENCOUNTER — Ambulatory Visit (INDEPENDENT_AMBULATORY_CARE_PROVIDER_SITE_OTHER): Payer: BC Managed Care – PPO | Admitting: Adult Health

## 2022-01-20 VITALS — BP 120/70 | HR 71 | Temp 98.3°F | Ht 76.0 in | Wt 215.0 lb

## 2022-01-20 DIAGNOSIS — J309 Allergic rhinitis, unspecified: Secondary | ICD-10-CM

## 2022-01-20 DIAGNOSIS — Z136 Encounter for screening for cardiovascular disorders: Secondary | ICD-10-CM

## 2022-01-20 DIAGNOSIS — I10 Essential (primary) hypertension: Secondary | ICD-10-CM | POA: Diagnosis not present

## 2022-01-20 DIAGNOSIS — Z0000 Encounter for general adult medical examination without abnormal findings: Secondary | ICD-10-CM

## 2022-01-20 DIAGNOSIS — B20 Human immunodeficiency virus [HIV] disease: Secondary | ICD-10-CM | POA: Diagnosis not present

## 2022-01-20 DIAGNOSIS — K5900 Constipation, unspecified: Secondary | ICD-10-CM

## 2022-01-20 DIAGNOSIS — R1084 Generalized abdominal pain: Secondary | ICD-10-CM

## 2022-01-20 DIAGNOSIS — R109 Unspecified abdominal pain: Secondary | ICD-10-CM | POA: Diagnosis not present

## 2022-01-20 LAB — CBC WITH DIFFERENTIAL/PLATELET
Basophils Absolute: 0 10*3/uL (ref 0.0–0.1)
Basophils Relative: 0.3 % (ref 0.0–3.0)
Eosinophils Absolute: 0 10*3/uL (ref 0.0–0.7)
Eosinophils Relative: 0.3 % (ref 0.0–5.0)
HCT: 34.6 % — ABNORMAL LOW (ref 39.0–52.0)
Hemoglobin: 12.1 g/dL — ABNORMAL LOW (ref 13.0–17.0)
Lymphocytes Relative: 29.7 % (ref 12.0–46.0)
Lymphs Abs: 1.3 10*3/uL (ref 0.7–4.0)
MCHC: 35.1 g/dL (ref 30.0–36.0)
MCV: 88.6 fl (ref 78.0–100.0)
Monocytes Absolute: 0.4 10*3/uL (ref 0.1–1.0)
Monocytes Relative: 7.9 % (ref 3.0–12.0)
Neutro Abs: 2.7 10*3/uL (ref 1.4–7.7)
Neutrophils Relative %: 61.8 % (ref 43.0–77.0)
Platelets: 199 10*3/uL (ref 150.0–400.0)
RBC: 3.9 Mil/uL — ABNORMAL LOW (ref 4.22–5.81)
RDW: 14.7 % (ref 11.5–15.5)
WBC: 4.4 10*3/uL (ref 4.0–10.5)

## 2022-01-20 LAB — COMPREHENSIVE METABOLIC PANEL
ALT: 41 U/L (ref 0–53)
AST: 55 U/L — ABNORMAL HIGH (ref 0–37)
Albumin: 2.6 g/dL — ABNORMAL LOW (ref 3.5–5.2)
Alkaline Phosphatase: 454 U/L — ABNORMAL HIGH (ref 39–117)
BUN: 12 mg/dL (ref 6–23)
CO2: 24 mEq/L (ref 19–32)
Calcium: 9 mg/dL (ref 8.4–10.5)
Chloride: 101 mEq/L (ref 96–112)
Creatinine, Ser: 1.39 mg/dL (ref 0.40–1.50)
GFR: 64.12 mL/min (ref >60.00)
Glucose, Bld: 83 mg/dL (ref 70–99)
Potassium: 4.1 mEq/L (ref 3.5–5.1)
Sodium: 131 mEq/L — ABNORMAL LOW (ref 135–145)
Total Bilirubin: 1.6 mg/dL — ABNORMAL HIGH (ref 0.2–1.2)
Total Protein: 11.1 g/dL — ABNORMAL HIGH (ref 6.0–8.3)

## 2022-01-20 LAB — URINALYSIS, ROUTINE W REFLEX MICROSCOPIC
Hgb urine dipstick: NEGATIVE
Leukocytes,Ua: NEGATIVE
Nitrite: NEGATIVE
Specific Gravity, Urine: 1.02 (ref 1.000–1.030)
Total Protein, Urine: 30 — AB
Urine Glucose: NEGATIVE
Urobilinogen, UA: >=8 — AB (ref 0.0–1.0)
pH: 6 (ref 5.0–8.0)

## 2022-01-20 LAB — LIPID PANEL
Cholesterol: 137 mg/dL (ref 0–200)
HDL: 22.9 mg/dL — ABNORMAL LOW (ref >39.00)
LDL Cholesterol: 94 mg/dL (ref 0–99)
NonHDL: 114.2
Total CHOL/HDL Ratio: 6
Triglycerides: 103 mg/dL (ref 0.0–149.0)
VLDL: 20.6 mg/dL (ref 0.0–40.0)

## 2022-01-20 LAB — TSH: TSH: 1.21 u[IU]/mL (ref 0.35–5.50)

## 2022-01-20 LAB — HEMOGLOBIN A1C: Hgb A1c MFr Bld: 5.1 % (ref 4.6–6.5)

## 2022-01-20 NOTE — Telephone Encounter (Signed)
Updated patient on his labs and x-ray.  3 did not show a bowel obstruction.  He does report that he has had a couple bowel movements today and is feeling better  Labs showed elevated liver enzymes and bilirubin in his urine - advised him this is common after being his gallbladder removed.  It should resolve.  He will bring this up with surgery.  We can recheck in a couple weeks.

## 2022-01-20 NOTE — Patient Instructions (Signed)
It was great seeing you today   We will follow up with you regarding your lab work and xray   Please let me know if you need anything    

## 2022-01-20 NOTE — Progress Notes (Signed)
Subjective:    Patient ID: Lee [name], male    DOB: 01/28/1981, 41 y.o.   MRN: [id number]  HPI Patient presents for yearly preventative medicine examination. 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  HIV -diagnosed in January 2018.  He is currently maintained on [name].  He is seen by ID on a routine basis.  Hypertension- managed with Norvasc 10 mg daily.  He denies dizziness, lightheadedness, blurred vision, chest pain, or shortness of breath BP Readings from Last 3 Encounters:  01/20/22 120/70  01/06/22 120/68  12/31/21 116/75   Seasonal Allergies with asthma-managed by [place] [facility] asthma.  Currently managed with albuterol rescue inhaler, Breo Ellipta daily inhaler, Singulair, and Astelin nasal spray  Constipation-reports that he continues to have constipation and abdominal pain/cramping status post laparoscopic cholecystectomy.  He reports that he is passing gas but when he does have a bowel movement at the very small amount and is usually softer in consistency.  At home he has tried taking Senokot,  using MiraLAX, and using prune juice.  He does not feel any relief from this.  He is no longer using oxycodone for pain management as he ran out.  All immunizations and health maintenance protocols were reviewed with the patient and needed orders were placed.  Appropriate screening laboratory values were ordered for the patient including screening of hyperlipidemia, renal function and hepatic function.  Medication reconciliation,  past medical history, social history, problem list and allergies were reviewed in detail with the patient  Goals were established with regard to weight loss, exercise, and  diet in compliance  with medications  Review of Systems  Constitutional: Negative.   HENT: Negative.    Eyes: Negative.   Respiratory: Negative.    Cardiovascular: Negative.   Gastrointestinal:  Positive for abdominal pain and constipation. Negative for abdominal distention, blood in stool, diarrhea, nausea, rectal pain and vomiting.  Endocrine: Negative.   Genitourinary: Negative.   Musculoskeletal: Negative.   Skin: Negative.   Allergic/Immunologic: Negative.   Neurological: Negative.   Hematological: Negative.   Psychiatric/Behavioral: Negative.    All other systems reviewed and are negative.  Past Medical History:  Diagnosis Date   CKD (chronic kidney disease) stage 3, GFR 30-59 ml/min (HCC) 02/20/2019   Eczema    Environmental allergies    Essential hypertension    HIV test positive (HCC)    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[place]) 07/24/2016   Shingles     Social History   Socioeconomic History   Marital status: Single    Spouse name: Not on file   Number of children: Not on file   Years of education: Not on file   Highest education level: Not on file  Occupational History   Not on file  Tobacco Use   Smoking status: Never   Smokeless tobacco: Never  Vaping Use   Vaping Use: Never used  Substance and Sexual Activity   Alcohol use: No   Drug use: No   Sexual activity: Not Currently    Partners: Male    Birth control/protection: Condom    Comment: Given Condoms  Other Topics Concern   Not on file  Social History Narrative   Works as Nurse, 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   Social Determinants  of Health   Financial Resource Strain: Not on file  Food Insecurity: Not on file  Transportation Needs: Not on file  Physical Activity: Not on file  Stress: Not on file  Social Connections: Not on file  Intimate Partner Violence: Not on file    Past Surgical History:  Procedure Laterality Date   CHOLECYSTECTOMY N/A 12/30/2021   Procedure: LAPAROSCOPIC CHOLECYSTECTOMY WITH  CHOLANGIOGRAM;   Surgeon: [name], MD;  Location: [facility];  Service: General;  Laterality: N/A;   LAPAROSCOPIC APPENDECTOMY N/A 03/12/2020   Procedure: APPENDECTOMY LAPAROSCOPIC;  Surgeon: Leighton Ruff, MD;  Location: [facility];  Service: General;  Laterality: N/A;   TYMPANOSTOMY TUBE PLACEMENT     WISDOM TOOTH EXTRACTION     WRIST SURGERY      Family History  Problem Relation Age of Onset   Hypercalcemia Mother    Hypertension Mother    Hypertension Father     Allergies  Allergen Reactions   Gabapentin Nausea And Vomiting   Lisinopril Other (See Comments)    Dry cough     Nickel Rash    Current Outpatient Medications on File Prior to Visit  Medication Sig Dispense Refill   albuterol (PROVENTIL HFA;VENTOLIN HFA) 108 (90 Base) MCG/ACT inhaler Inhale 2 puffs into the lungs every 6 (six) hours as needed for wheezing or shortness of breath. 1 Inhaler 1   amLODipine (NORVASC) 10 MG tablet Take 1 tablet (10 mg total) by mouth daily. (Patient taking differently: Take 10 mg by mouth at bedtime.) 30 tablet 0   azelastine (ASTELIN) 0.1 % nasal spray 1-2 puffs in each nostril twice daily     [name] 50-200-25 MG TABS tablet TAKE 1 TABLET DAILY (Patient taking differently: Take 1 tablet by mouth at bedtime.) 30 tablet 8   BREO ELLIPTA 200-25 MCG/ACT AEPB Inhale 1 puff into the lungs daily.     EPINEPHrine 0.3 mg/0.3 mL IJ SOAJ injection Inject 0.3 mg into the muscle once.     montelukast (SINGULAIR) 10 MG tablet Take 10 mg by mouth at bedtime.     oxyCODONE (OXY IR/ROXICODONE) 5 MG immediate release tablet Take 1-2 tablets (5-10 mg total) by mouth every 6 (six) hours as needed for moderate pain. 20 tablet 0   topiramate (TOPAMAX) 50 MG tablet Take 50 mg by mouth at bedtime.     Vitamin D, Ergocalciferol, (DRISDOL) 1.25 MG (50000 UT) CAPS capsule Take 50,000 Units by mouth once a week. Friday     azelastine (OPTIVAR) 0.05 % ophthalmic solution Apply to eye.     ondansetron (ZOFRAN-ODT) 4 MG disintegrating  tablet Take by mouth.     No current facility-administered medications on file prior to visit.    BP 120/70   Pulse 71   Temp 98.3 F (36.8 C) (Oral)   Ht '6\' 4"'$  (1.93 m)   Wt 215 lb (97.5 kg)   SpO2 98%   BMI 26.17 kg/m       Objective:   Physical Exam Vitals and nursing note reviewed.  Constitutional:      General: He is not in acute distress.    Appearance: Normal appearance. He is well-developed and normal weight.  HENT:     Head: Normocephalic and atraumatic.     Right Ear: Tympanic membrane, ear canal and external ear normal. There is no impacted cerumen.     Left Ear: Tympanic membrane, ear canal and external ear normal. There is no impacted cerumen.     Nose: Nose normal. No  congestion or rhinorrhea.     Mouth/Throat:     Mouth: Mucous membranes are moist.     Pharynx: Oropharynx is clear. No oropharyngeal exudate or posterior oropharyngeal erythema.  Eyes:     General:        Right eye: No discharge.        Left eye: No discharge.     Extraocular Movements: Extraocular movements intact.     Conjunctiva/sclera: Conjunctivae normal.     Pupils: Pupils are equal, round, and reactive to light.  Neck:     Vascular: No carotid bruit.     Trachea: No tracheal deviation.  Cardiovascular:     Rate and Rhythm: Normal rate and regular rhythm.     Pulses: Normal pulses.     Heart sounds: Normal heart sounds. No murmur heard.    No friction rub. No gallop.  Pulmonary:     Effort: Pulmonary effort is normal. No respiratory distress.     Breath sounds: Normal breath sounds. No stridor. No wheezing, rhonchi or rales.  Chest:     Chest wall: No tenderness.  Abdominal:     General: Bowel sounds are decreased. There is no distension.     Palpations: Abdomen is soft. There is no mass.     Tenderness: There is abdominal tenderness in the right upper quadrant, epigastric area and periumbilical area. There is no right CVA tenderness, left CVA tenderness, guarding or rebound.      Hernia: No hernia is present.     Comments: Well healed surgical scars on abdomen.   Musculoskeletal:        General: No swelling, tenderness, deformity or signs of injury. Normal range of motion.     Right lower leg: No edema.     Left lower leg: No edema.  Lymphadenopathy:     Cervical: No cervical adenopathy.  Skin:    General: Skin is warm and dry.     Capillary Refill: Capillary refill takes less than 2 seconds.     Coloration: Skin is not jaundiced or pale.     Findings: No bruising, erythema, lesion or rash.  Neurological:     General: No focal deficit present.     Mental Status: He is alert and oriented to person, place, and time.     Cranial Nerves: No cranial nerve deficit.     Sensory: No sensory deficit.     Motor: No weakness.     Coordination: Coordination normal.     Gait: Gait normal.     Deep Tendon Reflexes: Reflexes normal.  Psychiatric:        Mood and Affect: Mood normal.        Behavior: Behavior normal.        Thought Content: Thought content normal.        Judgment: Judgment normal.       Assessment & Plan:  1. Routine general medical examination at a health care facility  - CBC with Differential/Platelet; Future - Comprehensive metabolic panel; Future - Hemoglobin A1c; Future - Lipid panel; Future - TSH; Future - Urinalysis; Future - Urinalysis - TSH - Lipid panel - Hemoglobin A1c - Comprehensive metabolic panel - CBC with Differential/Platelet  2. Essential hypertension - well controlled. No change in medications - CBC with Differential/Platelet; Future - Comprehensive metabolic panel; Future - Hemoglobin A1c; Future - Lipid panel; Future - TSH; Future - TSH - Lipid panel - Hemoglobin A1c - Comprehensive metabolic panel - CBC with Differential/Platelet  3. Human  immunodeficiency virus I infection ([place]) - PER ID - CBC with Differential/Platelet; Future - Comprehensive metabolic panel; Future - Hemoglobin A1c; Future - Lipid  panel; Future - TSH; Future  4. Allergic rhinitis, unspecified seasonality, unspecified trigger - Per Allergy and asthma   5. Constipation, unspecified constipation type - May have him try magnesium citrate depending on KUB - DG Abd 1 View; Future  6. Generalized abdominal pain  - CBC with Differential/Platelet; Future - Comprehensive metabolic panel; Future - DG Abd 1 View; Future - Urinalysis; Future  [name], NP

## 2022-01-23 ENCOUNTER — Ambulatory Visit (HOSPITAL_BASED_OUTPATIENT_CLINIC_OR_DEPARTMENT_OTHER)
Admission: RE | Admit: 2022-01-23 | Discharge: 2022-01-23 | Disposition: A | Discharge status: Home / Self Care | Payer: BC Managed Care – PPO | Source: Ambulatory Visit | Attending: Student | Admitting: Student

## 2022-01-23 ENCOUNTER — Encounter (HOSPITAL_BASED_OUTPATIENT_CLINIC_OR_DEPARTMENT_OTHER): Payer: Self-pay

## 2022-01-23 ENCOUNTER — Other Ambulatory Visit (HOSPITAL_COMMUNITY): Payer: Self-pay | Admitting: Student

## 2022-01-23 ENCOUNTER — Other Ambulatory Visit: Payer: Self-pay | Admitting: Student

## 2022-01-23 DIAGNOSIS — R1011 Right upper quadrant pain: Secondary | ICD-10-CM

## 2022-01-23 DIAGNOSIS — N3289 Other specified disorders of bladder: Secondary | ICD-10-CM | POA: Diagnosis not present

## 2022-01-23 DIAGNOSIS — R161 Splenomegaly, not elsewhere classified: Secondary | ICD-10-CM | POA: Diagnosis not present

## 2022-01-23 MED ORDER — IOHEXOL 300 MG/ML  SOLN
100.0000 mL | Freq: Once | INTRAMUSCULAR | Status: AC | PRN
Start: 2022-01-23 — End: 2022-01-23
  Administered 2022-01-23: 100 mL via INTRAVENOUS

## 2022-01-26 ENCOUNTER — Encounter: Payer: Self-pay | Admitting: Adult Health

## 2022-01-27 DIAGNOSIS — E7800 Pure hypercholesterolemia, unspecified: Secondary | ICD-10-CM | POA: Diagnosis not present

## 2022-01-27 DIAGNOSIS — I10 Essential (primary) hypertension: Secondary | ICD-10-CM | POA: Diagnosis not present

## 2022-01-27 DIAGNOSIS — N1831 Chronic kidney disease, stage 3a: Secondary | ICD-10-CM | POA: Diagnosis not present

## 2022-01-27 DIAGNOSIS — B20 Human immunodeficiency virus [HIV] disease: Secondary | ICD-10-CM | POA: Diagnosis not present

## 2022-02-10 ENCOUNTER — Ambulatory Visit (INDEPENDENT_AMBULATORY_CARE_PROVIDER_SITE_OTHER): Payer: BC Managed Care – PPO | Admitting: Adult Health

## 2022-02-10 ENCOUNTER — Encounter: Payer: Self-pay | Admitting: Adult Health

## 2022-02-10 VITALS — BP 110/70 | HR 95 | Temp 99.1°F | Ht 76.0 in | Wt 218.0 lb

## 2022-02-10 DIAGNOSIS — L03116 Cellulitis of left lower limb: Secondary | ICD-10-CM

## 2022-02-10 MED ORDER — DOXYCYCLINE HYCLATE 100 MG PO CAPS: 100.0000 mg | ORAL_CAPSULE | Freq: Two times a day (BID) | ORAL | 0 refills | Status: DC

## 2022-02-10 MED ORDER — ONDANSETRON HCL 4 MG PO TABS: 4.0000 mg | ORAL_TABLET | Freq: Three times a day (TID) | ORAL | 1 refills | Status: DC | PRN

## 2022-02-10 NOTE — Progress Notes (Signed)
Subjective:    Patient ID: Lee [name], male    DOB: 1981/03/20, 41 y.o.   MRN: [id number]  HPI 41 year old male who  has a past medical history of CKD (chronic kidney disease) stage 3, GFR 30-59 ml/min (HCC) (02/20/2019), Eczema, Environmental allergies, Essential hypertension, HIV test positive ([place]), 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[place]) (07/24/2016), and Shingles.  He presents to the office today for an acute issues.   He reports that last week he was on a cruise and his r left leg became swollen, red, and warm.  At one of the cruise ports in [place] he got some antibiotics( unsure of which abx) and took them as directed for 5 days and his symptoms improved significantly.  Couple days after finishing the antibiotic the redness, warmth, and edema has started to come back.  He denies calf pain, chest pain, shortness of breath, or fever   Review of Systems See HPI   Past Medical History:  Diagnosis Date   CKD (chronic kidney disease) stage 3, GFR 30-59 ml/min (HCC) 02/20/2019   Eczema    Environmental allergies    Essential hypertension    HIV test positive (HCC)    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[place]) 07/24/2016   Shingles     Social History   Socioeconomic History   Marital status: Single    Spouse name: Not on file   Number of children: Not on file   Years of education: Not on file   Highest education level: Associate degree: academic program  Occupational History   Not on file  Tobacco Use   Smoking status: Never   Smokeless tobacco: Never  Vaping Use   Vaping Use: Never used  Substance and Sexual Activity   Alcohol use: No   Drug use: No   Sexual activity: Not Currently    Partners: Male    Birth control/protection: Condom    Comment: Given Condoms  Other Topics Concern   Not on file  Social History Narrative   Works as Nurse, 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   Social Determinants of Health   Financial Resource Strain: Low Risk  (02/09/2022)    Overall Financial Resource Strain (CARDIA)    Difficulty of Paying Living Expenses: Not hard at all  Food Insecurity: No Food Insecurity (02/09/2022)   Hunger Vital Sign    Worried About Running Out of Food in the Last Year: Never true    Ran Out of Food in the Last Year: Never true  Transportation Needs: No Transportation Needs (02/09/2022)   PRAPARE - Hydrologist (Medical): No    Lack of Transportation (Non-Medical): No  Physical Activity: Unknown (02/09/2022)   Exercise Vital Sign    Days of Exercise per Week: 0 days    Minutes of Exercise per Session: Not on file  Stress: Stress Concern Present (02/09/2022)   [place]    Feeling of Stress : To some extent  Social Connections: Moderately Integrated (02/09/2022)   Social Connection and Isolation Panel [NHANES]    Frequency of Communication with Friends and Family: More than three times a week    Frequency of Social Gatherings with Friends and Family: Not on file    Attends Religious Services: More than 4 times per year    Active Member of [employer] or Organizations: Yes    Attends Archivist Meetings: More than 4 times per year  Marital Status: Never married  Intimate Partner Violence: Not on file    Past Surgical History:  Procedure Laterality Date   CHOLECYSTECTOMY N/A 12/30/2021   Procedure: LAPAROSCOPIC CHOLECYSTECTOMY WITH  CHOLANGIOGRAM;  Surgeon: [name], MD;  Location: [facility];  Service: General;  Laterality: N/A;   LAPAROSCOPIC APPENDECTOMY N/A 03/12/2020   Procedure: APPENDECTOMY LAPAROSCOPIC;  Surgeon: Leighton Ruff, MD;  Location: [facility];  Service: General;  Laterality: N/A;   TYMPANOSTOMY TUBE PLACEMENT     WISDOM TOOTH EXTRACTION     WRIST SURGERY      Family History  Problem Relation Age of Onset   Hypercalcemia Mother    Hypertension Mother    Hypertension Father     Allergies  Allergen Reactions    Gabapentin Nausea And Vomiting   Lisinopril Other (See Comments)    Dry cough     Nickel Rash    Current Outpatient Medications on File Prior to Visit  Medication Sig Dispense Refill   albuterol (PROVENTIL HFA;VENTOLIN HFA) 108 (90 Base) MCG/ACT inhaler Inhale 2 puffs into the lungs every 6 (six) hours as needed for wheezing or shortness of breath. 1 Inhaler 1   amLODipine (NORVASC) 10 MG tablet Take 1 tablet (10 mg total) by mouth daily. (Patient taking differently: Take 10 mg by mouth at bedtime.) 30 tablet 0   azelastine (ASTELIN) 0.1 % nasal spray 1-2 puffs in each nostril twice daily     azelastine (OPTIVAR) 0.05 % ophthalmic solution Apply to eye.     [name] 50-200-25 MG TABS tablet TAKE 1 TABLET DAILY (Patient taking differently: Take 1 tablet by mouth at bedtime.) 30 tablet 8   BREO ELLIPTA 200-25 MCG/ACT AEPB Inhale 1 puff into the lungs daily.     EPINEPHrine 0.3 mg/0.3 mL IJ SOAJ injection Inject 0.3 mg into the muscle once.     montelukast (SINGULAIR) 10 MG tablet Take 10 mg by mouth at bedtime.     topiramate (TOPAMAX) 50 MG tablet Take 50 mg by mouth at bedtime.     Vitamin D, Ergocalciferol, (DRISDOL) 1.25 MG (50000 UT) CAPS capsule Take 50,000 Units by mouth once a week. Friday     No current facility-administered medications on file prior to visit.    BP 110/70   Pulse 95   Temp 99.1 F (37.3 C) (Oral)   Ht '6\' 4"'$  (1.93 m)   Wt 218 lb (98.9 kg)   SpO2 100%   BMI 26.54 kg/m       Objective:   Physical Exam Vitals and nursing note reviewed.  Constitutional:      Appearance: Normal appearance.  Cardiovascular:     Comments: Mild pitting edema located around the left ankle and foot Musculoskeletal:        General: Normal range of motion.     Left lower leg: 1+ Pitting Edema present.  Skin:    General: Skin is warm and dry.     Capillary Refill: Capillary refill takes less than 2 seconds.     Findings: Erythema present.     Comments: He is warm and has  some erythema noted from ankle to the lower shin.  No calf pain, redness, warmth, or edema noted  Neurological:     General: No focal deficit present.     Mental Status: He is alert and oriented to person, place, and time.  Psychiatric:        Mood and Affect: Mood normal.        Behavior: Behavior  normal.        Thought Content: Thought content normal.        Judgment: Judgment normal.       Assessment & Plan:  1. Cellulitis of left lower extremity -We will treat for suspected cellulitis.  Follow-up early next week if no improvement - doxycycline (VIBRAMYCIN) 100 MG capsule; Take 1 capsule (100 mg total) by mouth 2 (two) times daily.  Dispense: 14 capsule; Refill: 0  [name], NP

## 2022-02-24 ENCOUNTER — Ambulatory Visit: Payer: BC Managed Care – PPO | Admitting: Internal Medicine

## 2022-02-24 ENCOUNTER — Other Ambulatory Visit: Payer: Self-pay

## 2022-02-24 ENCOUNTER — Ambulatory Visit (INDEPENDENT_AMBULATORY_CARE_PROVIDER_SITE_OTHER): Payer: BC Managed Care – PPO | Admitting: Infectious Diseases

## 2022-02-24 ENCOUNTER — Ambulatory Visit: Payer: BC Managed Care – PPO | Admitting: Infectious Diseases

## 2022-02-24 ENCOUNTER — Encounter: Payer: Self-pay | Admitting: Infectious Diseases

## 2022-02-24 VITALS — BP 99/64 | HR 67 | Temp 98.0°F | Wt 216.0 lb

## 2022-02-24 DIAGNOSIS — I10 Essential (primary) hypertension: Secondary | ICD-10-CM

## 2022-02-24 DIAGNOSIS — B20 Human immunodeficiency virus [HIV] disease: Secondary | ICD-10-CM

## 2022-02-24 DIAGNOSIS — Z8619 Personal history of other infectious and parasitic diseases: Secondary | ICD-10-CM | POA: Diagnosis not present

## 2022-02-24 MED ORDER — BIKTARVY 50-200-25 MG PO TABS: 1.0000 | ORAL_TABLET | Freq: Every evening | ORAL | 8 refills | Status: DC

## 2022-02-24 NOTE — Patient Instructions (Addendum)
Nice to meet you   Please continue the biktarvy everyday like you have been.   STOP the amlodipine - hopefully this will help the leg swelling. Please schedule a follow up with your pcp in a few months to recheck your blood pressure to make sure it is OK off the medication.   If Tommi Rumps has other instructions for you about your topamax and amlodipine I asked him to reach out to you.   Dr. Johnnye Sima is still practicing - give them a call at [phone] to schedule a visit in 9 months or so.

## 2022-02-24 NOTE — Assessment & Plan Note (Signed)
Very well controlled on once daily [name]. No concerns with access or adherence to medication. They are tolerating the medication well without side effects. No drug interactions identified. Pertinent lab tests ordered today.  No changes to insurance coverage.  No concern over anxious/depressed mood.  Sexual health and family planning discussed - no needs today.  Vaccines traditionally declined from review.     He would like to continue following with Dr. Johnnye Sima - information given to schedule appt with him today in 66mfor follow up.  No dental needs today.

## 2022-02-24 NOTE — Assessment & Plan Note (Signed)
BP systolic < 122 today. He has been taking norvasc 5 mg daily. I think with his weight loss he can stop this and trial off. May also help his leg swelling too.  Will send a note to his PCP to see if they can help monitor.

## 2022-02-24 NOTE — Progress Notes (Signed)
Name: Lee [name]  DOB: Dec 01, 1980 MRN: [id number] PCP: [name], NP    Patient Active Problem List   Diagnosis Date Noted   Gallstone pancreatitis 12/30/2021   Hyponatremia 12/29/2021   Gallbladder colic 84/53/6468   Hearing loss 09/09/2020   Otitis 02/20/2019   Stage 3a chronic kidney disease (CKD) ([place]) - baseline SCr 1.29-1.67 02/20/2019   Syphilis 02/21/2018   Hepatitis B immune 09/20/2016   Essential hypertension 08/03/2016   Shingles 08/08/2011   TENDINITIS, LEFT WRIST 01/13/2009   SYNOVITIS 04/09/2008   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[place]) 10/16/2007   Allergic rhinitis 10/03/2007   Tinea barbae 10/03/2007     Brief Narrative:  Lee [name] is a 41 y.o. male here for follow up on well controlled HIV. Diagnosed in 2018, stage 2.   Previous Regimens: Triumeq (increased LFTs) [name] 2018  Genotypes:   Subjective:   Chief Complaint  Patient presents with   Follow-up     HPI:  Lee [name] is a 41 y.o. male with HIV diagnosed 08-03-16.  LOV with Dr. Johnnye Sima in November 2023.   Met with PCP at the end of July and required antibiotics for cellulitis of the left leg. This has resolved. This is the second episode over 10 years or so. Does have some leg swelling from time to time with standing long times.   Emergency surgery for gall bladder removal (laparoscopic). Was a tough recovery but feels like he is doing better now. This has caused his variable weight. Feels about 90-95% better now. Feels that his appetite has been changed since surgery - has been using zofran for nausea and biotene for mouth dryness which help. Has had to do pepcid as well for heartburn.  [name] he takes every night and does not ever misses this.   Wondering if he can come off amlodipine and topamax.       05/26/2021    3:43 PM  Depression screen PHQ 2/9  Decreased Interest 0  Down, Depressed, Hopeless 0  PHQ - 2 Score 0    Review of Systems   Constitutional:  Negative for chills, fever, malaise/fatigue and weight loss.  HENT:  Negative for sore throat.   Respiratory:  Negative for cough, sputum production and shortness of breath.   Cardiovascular:  Positive for leg swelling.  Gastrointestinal:  Negative for abdominal pain, diarrhea and vomiting.  Musculoskeletal:  Negative for joint pain, myalgias and neck pain.  Skin:  Negative for rash.  Neurological:  Negative for headaches.  Psychiatric/Behavioral:  Negative for depression and substance abuse. The patient is not nervous/anxious.       Past Medical History:  Diagnosis Date   CKD (chronic kidney disease) stage 3, GFR 30-59 ml/min (HCC) 02/20/2019   Eczema    Environmental allergies    Essential hypertension    HIV test positive (HCC)    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[place]) 07/24/2016   Shingles     Outpatient Medications Prior to Visit  Medication Sig Dispense Refill   albuterol (PROVENTIL HFA;VENTOLIN HFA) 108 (90 Base) MCG/ACT inhaler Inhale 2 puffs into the lungs every 6 (six) hours as needed for wheezing or shortness of breath. 1 Inhaler 1   amLODipine (NORVASC) 10 MG tablet Take 1 tablet (10 mg total) by mouth daily. (Patient taking differently: Take 10 mg by mouth at bedtime.) 30 tablet 0   azelastine (ASTELIN) 0.1 % nasal spray 1-2 puffs in each nostril twice daily     azelastine (  OPTIVAR) 0.05 % ophthalmic solution Apply to eye.     BREO ELLIPTA 200-25 MCG/ACT AEPB Inhale 1 puff into the lungs daily.     EPINEPHrine 0.3 mg/0.3 mL IJ SOAJ injection Inject 0.3 mg into the muscle once.     montelukast (SINGULAIR) 10 MG tablet Take 10 mg by mouth at bedtime.     ondansetron (ZOFRAN) 4 MG tablet Take 1 tablet (4 mg total) by mouth every 8 (eight) hours as needed for nausea or vomiting. 20 tablet 1   topiramate (TOPAMAX) 50 MG tablet Take 50 mg by mouth at bedtime.     Vitamin D, Ergocalciferol, (DRISDOL) 1.25 MG (50000 UT) CAPS capsule Take 50,000 Units  by mouth once a week. Friday     [name] 50-200-25 MG TABS tablet TAKE 1 TABLET DAILY (Patient taking differently: Take 1 tablet by mouth at bedtime.) 30 tablet 8   doxycycline (VIBRAMYCIN) 100 MG capsule Take 1 capsule (100 mg total) by mouth 2 (two) times daily. (Patient not taking: Reported on 02/24/2022) 14 capsule 0   No facility-administered medications prior to visit.     Allergies  Allergen Reactions   Gabapentin Nausea And Vomiting   Lisinopril Other (See Comments)    Dry cough     Nickel Rash    Social History   Tobacco Use   Smoking status: Never   Smokeless tobacco: Never  Vaping Use   Vaping Use: Never used  Substance Use Topics   Alcohol use: No   Drug use: No    Family History  Problem Relation Age of Onset   Hypercalcemia Mother    Hypertension Mother    Hypertension Father     Social History   Substance and Sexual Activity  Sexual Activity Not Currently   Partners: Male   Birth control/protection: Condom   Comment: Given Condoms     Objective:   Vitals:   02/24/22 1059  BP: 99/64  Pulse: 67  Temp: 98 F (36.7 C)  TempSrc: Oral  SpO2: 100%  Weight: 216 lb (98 kg)   Body mass index is 26.29 kg/m.  Physical Exam  Lab Results Lab Results  Component Value Date   WBC 4.4 01/20/2022   HGB 12.1 (L) 01/20/2022   HCT 34.6 (L) 01/20/2022   MCV 88.6 01/20/2022   PLT 199.0 01/20/2022    Lab Results  Component Value Date   CREATININE 1.39 01/20/2022   BUN 12 01/20/2022   NA 131 (L) 01/20/2022   K 4.1 01/20/2022   CL 101 01/20/2022   CO2 24 01/20/2022    Lab Results  Component Value Date   ALT 41 01/20/2022   AST 55 (H) 01/20/2022   ALKPHOS 454 (H) 01/20/2022   BILITOT 1.6 (H) 01/20/2022    Lab Results  Component Value Date   CHOL 137 01/20/2022   HDL 22.90 (L) 01/20/2022   LDLCALC 94 01/20/2022   TRIG 103.0 01/20/2022   CHOLHDL 6 01/20/2022   HIV 1 RNA Quant  Date Value  05/26/2021 38 Copies/mL (H)  12/05/2019 35  copies/mL (H)  02/20/2019 <20 DETECTED copies/mL (A)   CD4 T Cell Abs (/uL)  Date Value  05/26/2021 525  12/05/2019 538  02/20/2019 524     Assessment & Plan:   Problem List Items Addressed This Visit       Unprioritized   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[place]) - Primary    Very well controlled on once daily [name]. No concerns with access or adherence  to medication. They are tolerating the medication well without side effects. No drug interactions identified. Pertinent lab tests ordered today.  No changes to insurance coverage.  No concern over anxious/depressed mood.  Sexual health and family planning discussed - no needs today.  Vaccines traditionally declined from review.     He would like to continue following with Dr. Johnnye Sima - information given to schedule appt with him today in 73mfor follow up.  No dental needs today.        Relevant Medications   bictegravir-emtricitabine-tenofovir AF ([name]) 50-200-25 MG TABS tablet   Other Relevant Orders   HIV 1 RNA quant-no reflex-bld   T-helper cells (CD4) [facility]   Essential hypertension    BP systolic < 1144today. He has been taking norvasc 5 mg daily. I think with his weight loss he can stop this and trial off. May also help his leg swelling too.  Will send a note to his PCP to see if they can help monitor.       Other Visit Diagnoses     History of syphilis       Relevant Orders   RPR       SJanene Madeira MSN, NP-C RGarfield County Health Centerfor Infectious DRobinsonPager: [phone]: [phone] 02/24/22  3:08 PM

## 2022-02-27 LAB — T-HELPER CELLS (CD4) COUNT (NOT AT ARMC)
Absolute CD4: 394 cells/uL — ABNORMAL LOW (ref 490–1740)
CD4 T Helper %: 39 % (ref 30–61)
Total lymphocyte count: 1010 cells/uL (ref 850–3900)

## 2022-02-27 LAB — RPR TITER: RPR Titer: 1:4 {titer} — ABNORMAL HIGH

## 2022-02-27 LAB — FLUORESCENT TREPONEMAL AB(FTA)-IGG-BLD: Fluorescent Treponemal ABS: REACTIVE — AB

## 2022-02-27 LAB — RPR: RPR Ser Ql: REACTIVE — AB

## 2022-02-27 LAB — HIV-1 RNA QUANT-NO REFLEX-BLD
HIV 1 RNA Quant: NOT DETECTED Copies/mL
HIV-1 RNA Quant, Log: NOT DETECTED Log cps/mL

## 2022-02-28 ENCOUNTER — Encounter: Payer: Self-pay | Admitting: Adult Health

## 2022-03-02 ENCOUNTER — Other Ambulatory Visit: Payer: Self-pay | Admitting: Adult Health

## 2022-03-02 MED ORDER — TOPIRAMATE 50 MG PO TABS: 25.0000 mg | ORAL_TABLET | Freq: Every day | ORAL | 0 refills | Status: DC

## 2022-03-10 DIAGNOSIS — H1045 Other chronic allergic conjunctivitis: Secondary | ICD-10-CM | POA: Diagnosis not present

## 2022-03-10 DIAGNOSIS — R052 Subacute cough: Secondary | ICD-10-CM | POA: Diagnosis not present

## 2022-03-10 DIAGNOSIS — J3081 Allergic rhinitis due to animal (cat) (dog) hair and dander: Secondary | ICD-10-CM | POA: Diagnosis not present

## 2022-03-10 DIAGNOSIS — J301 Allergic rhinitis due to pollen: Secondary | ICD-10-CM | POA: Diagnosis not present

## 2022-03-10 DIAGNOSIS — J3089 Other allergic rhinitis: Secondary | ICD-10-CM | POA: Diagnosis not present

## 2022-03-27 ENCOUNTER — Other Ambulatory Visit: Payer: Self-pay | Admitting: Infectious Diseases

## 2022-03-27 DIAGNOSIS — B20 Human immunodeficiency virus [HIV] disease: Secondary | ICD-10-CM

## 2022-04-02 DIAGNOSIS — N1831 Chronic kidney disease, stage 3a: Secondary | ICD-10-CM | POA: Diagnosis not present

## 2022-04-02 DIAGNOSIS — Z79899 Other long term (current) drug therapy: Secondary | ICD-10-CM | POA: Diagnosis not present

## 2022-04-02 DIAGNOSIS — E7800 Pure hypercholesterolemia, unspecified: Secondary | ICD-10-CM | POA: Diagnosis not present

## 2022-04-02 DIAGNOSIS — E559 Vitamin D deficiency, unspecified: Secondary | ICD-10-CM | POA: Diagnosis not present

## 2022-04-02 DIAGNOSIS — B20 Human immunodeficiency virus [HIV] disease: Secondary | ICD-10-CM | POA: Diagnosis not present

## 2022-04-02 DIAGNOSIS — R350 Frequency of micturition: Secondary | ICD-10-CM | POA: Diagnosis not present

## 2022-04-02 DIAGNOSIS — I10 Essential (primary) hypertension: Secondary | ICD-10-CM | POA: Diagnosis not present

## 2022-04-29 ENCOUNTER — Other Ambulatory Visit: Payer: Self-pay | Admitting: Adult Health

## 2022-05-02 NOTE — Telephone Encounter (Signed)
Last OV 02/10/22 for acute visit Last refill per chart 03/02/22

## 2022-05-04 NOTE — Telephone Encounter (Signed)
Tried to call pt but no answer. Left vm.

## 2022-05-04 NOTE — Telephone Encounter (Signed)
Pt is returning kendra call 

## 2022-05-05 NOTE — Telephone Encounter (Signed)
Pt stated he has stopped this medication and does not need it.

## 2022-05-09 DIAGNOSIS — H6981 Other specified disorders of Eustachian tube, right ear: Secondary | ICD-10-CM | POA: Diagnosis not present

## 2022-05-09 DIAGNOSIS — H7292 Unspecified perforation of tympanic membrane, left ear: Secondary | ICD-10-CM | POA: Diagnosis not present

## 2022-05-09 DIAGNOSIS — K111 Hypertrophy of salivary gland: Secondary | ICD-10-CM | POA: Diagnosis not present

## 2022-05-09 DIAGNOSIS — Z21 Asymptomatic human immunodeficiency virus [HIV] infection status: Secondary | ICD-10-CM | POA: Diagnosis not present

## 2022-05-19 ENCOUNTER — Other Ambulatory Visit: Payer: Self-pay | Admitting: Otolaryngology

## 2022-05-19 DIAGNOSIS — K111 Hypertrophy of salivary gland: Secondary | ICD-10-CM

## 2022-06-07 ENCOUNTER — Other Ambulatory Visit: Payer: Self-pay | Admitting: Infectious Diseases

## 2022-06-07 DIAGNOSIS — I10 Essential (primary) hypertension: Secondary | ICD-10-CM

## 2022-06-08 ENCOUNTER — Other Ambulatory Visit: Payer: Self-pay | Admitting: Infectious Diseases

## 2022-06-08 ENCOUNTER — Ambulatory Visit
Admission: RE | Admit: 2022-06-08 | Discharge: 2022-06-08 | Disposition: A | Discharge status: Home / Self Care | Payer: BC Managed Care – PPO | Source: Ambulatory Visit | Attending: Otolaryngology | Admitting: Otolaryngology

## 2022-06-08 DIAGNOSIS — K111 Hypertrophy of salivary gland: Secondary | ICD-10-CM | POA: Diagnosis not present

## 2022-06-08 DIAGNOSIS — I10 Essential (primary) hypertension: Secondary | ICD-10-CM

## 2022-06-08 DIAGNOSIS — R599 Enlarged lymph nodes, unspecified: Secondary | ICD-10-CM | POA: Diagnosis not present

## 2022-06-08 DIAGNOSIS — J3489 Other specified disorders of nose and nasal sinuses: Secondary | ICD-10-CM | POA: Diagnosis not present

## 2022-06-08 MED ORDER — IOPAMIDOL (ISOVUE-300) INJECTION 61%
75.0000 mL | Freq: Once | INTRAVENOUS | Status: AC | PRN
  Administered 2022-06-08: 75 mL via INTRAVENOUS

## 2022-06-13 ENCOUNTER — Other Ambulatory Visit: Payer: Self-pay | Admitting: Otolaryngology

## 2022-06-13 DIAGNOSIS — R599 Enlarged lymph nodes, unspecified: Secondary | ICD-10-CM

## 2022-06-13 MED ORDER — AMLODIPINE BESYLATE 10 MG PO TABS: 10.0000 mg | ORAL_TABLET | Freq: Every day | ORAL | 0 refills | Status: DC

## 2022-06-27 ENCOUNTER — Ambulatory Visit (INDEPENDENT_AMBULATORY_CARE_PROVIDER_SITE_OTHER): Payer: BC Managed Care – PPO | Admitting: Infectious Diseases

## 2022-06-27 ENCOUNTER — Encounter: Payer: Self-pay | Admitting: Infectious Diseases

## 2022-06-27 VITALS — BP 135/83 | HR 96 | Temp 97.8°F | Ht 76.0 in | Wt 225.8 lb

## 2022-06-27 DIAGNOSIS — R591 Generalized enlarged lymph nodes: Secondary | ICD-10-CM

## 2022-06-27 DIAGNOSIS — A539 Syphilis, unspecified: Secondary | ICD-10-CM | POA: Diagnosis not present

## 2022-06-27 DIAGNOSIS — B20 Human immunodeficiency virus [HIV] disease: Secondary | ICD-10-CM | POA: Diagnosis not present

## 2022-06-27 DIAGNOSIS — Z113 Encounter for screening for infections with a predominantly sexual mode of transmission: Secondary | ICD-10-CM

## 2022-06-27 DIAGNOSIS — Z79899 Other long term (current) drug therapy: Secondary | ICD-10-CM

## 2022-06-27 DIAGNOSIS — I10 Essential (primary) hypertension: Secondary | ICD-10-CM | POA: Diagnosis not present

## 2022-06-27 DIAGNOSIS — Z21 Asymptomatic human immunodeficiency virus [HIV] infection status: Secondary | ICD-10-CM

## 2022-06-27 MED ORDER — AMLODIPINE BESYLATE 10 MG PO TABS: 10.0000 mg | ORAL_TABLET | Freq: Every day | ORAL | 0 refills | Status: DC

## 2022-06-27 NOTE — Assessment & Plan Note (Signed)
Appreciate his f/u with ENT.  He has notable enlarged LN in his neck, firm, non-mobile

## 2022-06-27 NOTE — Progress Notes (Signed)
   Subjective:    Patient ID: Lee [name], male  DOB: 03-11-1981, 41 y.o.        MRN: [id number]   HPI 41 yo M with hx of HTN and HIV+ dx 08-03-16.  He had first visit 2-15 and was also found to have syphilis. He received IM PEN G on 09-14-16.  His genotype is naive. He is HLA negative.  Has been on triumeq but at 5-31 he had increased LFTs and Cr on labs. He had repeat labs done on 6-8 which confirmed this. He was seen in f/u with pharm 02-2017 and was changed to biktarvy.  He had emergency cholecystectomy 12-2021. 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  He has seen ENT, and is getting CT scan to f/u. Had tympanostomy tubes placed as well (05-2022). Hearing better afterwards. He states he has gotten tubes yearly.  He was seen by NP [name] and was doing well (02-2022).   Today he is doing well, no problems with biktarvy.  He has early satiety since his gallbladder was removed.   Has been followed by Dr Donneta Romberg for his allergies- pt stopped his immunotherapy.    HIV 1 RNA Quant  Date Value  02/24/2022 Not Detected Copies/mL  05/26/2021 38 Copies/mL (H)  12/05/2019 35 copies/mL (H)   CD4 T Cell Abs (/uL)  Date Value  05/26/2021 525  12/05/2019 538  02/20/2019 524     Health Maintenance  Topic Date Due  . INFLUENZA VACCINE  02/21/2022  . COVID-19 Vaccine (5 - 2023-24 season) 03/24/2022  . DTaP/Tdap/Td (8 - Td or Tdap) 02/19/2029  . Hepatitis C Screening  Completed  . HIV Screening  Completed  . HPV VACCINES  Aged Out      Review of Systems  Constitutional:  Negative for chills, fever and weight loss.  Respiratory:  Negative for cough and sputum production.   Gastrointestinal:  Negative for constipation and diarrhea.  Genitourinary:  Negative for dysuria.  Neurological:  Negative for headaches.  Psychiatric/Behavioral:  The patient does not have insomnia.     Please see HPI. All other systems reviewed and negative.     Objective:  Physical Exam Vitals  reviewed.  Constitutional:      Appearance: Normal appearance.  HENT:     Mouth/Throat:     Mouth: Mucous membranes are moist.     Pharynx: No oropharyngeal exudate.  Cardiovascular:     Rate and Rhythm: Normal rate and regular rhythm.  Pulmonary:     Effort: Pulmonary effort is normal.     Breath sounds: Normal breath sounds.  Abdominal:     General: Bowel sounds are normal.     Palpations: Abdomen is soft.  Musculoskeletal:        General: Normal range of motion.     Cervical back: Normal range of motion and neck supple.     Right lower leg: No edema.     Left lower leg: No edema.  Lymphadenopathy:     Cervical: Cervical adenopathy present.  Neurological:     General: No focal deficit present.     Mental Status: He is alert.           Assessment & Plan:

## 2022-06-27 NOTE — Assessment & Plan Note (Signed)
Fairly well controlled Will refill his norvasc.

## 2022-06-27 NOTE — Assessment & Plan Note (Signed)
Has gotten covid vax, refuses flu shot Given condoms. He will f/u with his dentist Will see him back in 6 months and recheck his labs at that point.

## 2022-06-27 NOTE — Assessment & Plan Note (Signed)
His RPR is improved.  Will continue to follow.

## 2022-06-30 ENCOUNTER — Ambulatory Visit
Admission: RE | Admit: 2022-06-30 | Discharge: 2022-06-30 | Disposition: A | Discharge status: Home / Self Care | Payer: BC Managed Care – PPO | Source: Ambulatory Visit | Attending: Otolaryngology | Admitting: Otolaryngology

## 2022-06-30 DIAGNOSIS — R599 Enlarged lymph nodes, unspecified: Secondary | ICD-10-CM

## 2022-06-30 DIAGNOSIS — R590 Localized enlarged lymph nodes: Secondary | ICD-10-CM | POA: Diagnosis not present

## 2022-06-30 DIAGNOSIS — R162 Hepatomegaly with splenomegaly, not elsewhere classified: Secondary | ICD-10-CM | POA: Diagnosis not present

## 2022-06-30 DIAGNOSIS — R911 Solitary pulmonary nodule: Secondary | ICD-10-CM | POA: Diagnosis not present

## 2022-06-30 DIAGNOSIS — J9859 Other diseases of mediastinum, not elsewhere classified: Secondary | ICD-10-CM | POA: Diagnosis not present

## 2022-06-30 MED ORDER — IOPAMIDOL (ISOVUE-300) INJECTION 61%
75.0000 mL | Freq: Once | INTRAVENOUS | Status: AC | PRN
  Administered 2022-06-30: 75 mL via INTRAVENOUS

## 2022-08-06 ENCOUNTER — Other Ambulatory Visit: Payer: Self-pay | Admitting: Infectious Diseases

## 2022-08-06 DIAGNOSIS — I10 Essential (primary) hypertension: Secondary | ICD-10-CM

## 2022-08-30 DIAGNOSIS — R2231 Localized swelling, mass and lump, right upper limb: Secondary | ICD-10-CM | POA: Diagnosis not present

## 2022-08-31 ENCOUNTER — Encounter: Payer: Self-pay | Admitting: Surgery

## 2022-09-01 ENCOUNTER — Other Ambulatory Visit (HOSPITAL_COMMUNITY): Payer: Self-pay | Admitting: Surgery

## 2022-09-01 DIAGNOSIS — R2231 Localized swelling, mass and lump, right upper limb: Secondary | ICD-10-CM

## 2022-09-04 ENCOUNTER — Encounter: Payer: Self-pay | Admitting: General Practice

## 2022-09-04 NOTE — Progress Notes (Signed)
Greggory Keen, MD  Allen Kell, NT Ok for [place] CORE BX RT AXILLARY BULKY ADENOPATHY  TS       Previous Messages    ----- Message ----- From: Allen Kell, NT Sent: 09/01/2022  11:25 AM EST To: Ir Procedure Requests Subject: [place] Core Biopsy Lymph Node                      Procedure: [place] CORE BIOPSY (LYMPH NODES)  Reason: Axillary Mass Right  History: Ct in chart  Provider: [name], MD  Contact: [phone]

## 2022-09-05 ENCOUNTER — Other Ambulatory Visit: Payer: Self-pay | Admitting: Infectious Diseases

## 2022-09-05 DIAGNOSIS — I10 Essential (primary) hypertension: Secondary | ICD-10-CM

## 2022-09-12 ENCOUNTER — Other Ambulatory Visit: Payer: Self-pay | Admitting: Radiology

## 2022-09-12 DIAGNOSIS — R2231 Localized swelling, mass and lump, right upper limb: Secondary | ICD-10-CM

## 2022-09-13 ENCOUNTER — Encounter (HOSPITAL_COMMUNITY): Payer: Self-pay

## 2022-09-13 ENCOUNTER — Ambulatory Visit (HOSPITAL_COMMUNITY)
Admission: RE | Admit: 2022-09-13 | Discharge: 2022-09-13 | Disposition: A | Discharge status: Home / Self Care | Payer: BC Managed Care – PPO | Source: Ambulatory Visit | Attending: Surgery | Admitting: Surgery

## 2022-09-13 ENCOUNTER — Other Ambulatory Visit: Payer: Self-pay

## 2022-09-13 DIAGNOSIS — I129 Hypertensive chronic kidney disease with stage 1 through stage 4 chronic kidney disease, or unspecified chronic kidney disease: Secondary | ICD-10-CM | POA: Diagnosis not present

## 2022-09-13 DIAGNOSIS — R2231 Localized swelling, mass and lump, right upper limb: Secondary | ICD-10-CM | POA: Diagnosis not present

## 2022-09-13 DIAGNOSIS — R590 Localized enlarged lymph nodes: Secondary | ICD-10-CM | POA: Diagnosis not present

## 2022-09-13 DIAGNOSIS — C8194 Hodgkin lymphoma, unspecified, lymph nodes of axilla and upper limb: Secondary | ICD-10-CM | POA: Diagnosis not present

## 2022-09-13 DIAGNOSIS — Z9049 Acquired absence of other specified parts of digestive tract: Secondary | ICD-10-CM | POA: Insufficient documentation

## 2022-09-13 DIAGNOSIS — Z21 Asymptomatic human immunodeficiency virus [HIV] infection status: Secondary | ICD-10-CM | POA: Diagnosis not present

## 2022-09-13 DIAGNOSIS — N183 Chronic kidney disease, stage 3 unspecified: Secondary | ICD-10-CM | POA: Diagnosis not present

## 2022-09-13 LAB — CBC WITH DIFFERENTIAL/PLATELET
Abs Immature Granulocytes: 0.01 10*3/uL (ref 0.00–0.07)
Basophils Absolute: 0 10*3/uL (ref 0.0–0.1)
Basophils Relative: 1 %
Eosinophils Absolute: 0 10*3/uL (ref 0.0–0.5)
Eosinophils Relative: 0 %
HCT: 36.5 % — ABNORMAL LOW (ref 39.0–52.0)
Hemoglobin: 12.1 g/dL — ABNORMAL LOW (ref 13.0–17.0)
Immature Granulocytes: 0 %
Lymphocytes Relative: 32 %
Lymphs Abs: 1.4 10*3/uL (ref 0.7–4.0)
MCH: 28.8 pg (ref 26.0–34.0)
MCHC: 33.2 g/dL (ref 30.0–36.0)
MCV: 86.9 fL (ref 80.0–100.0)
Monocytes Absolute: 0.3 10*3/uL (ref 0.1–1.0)
Monocytes Relative: 7 %
Neutro Abs: 2.6 10*3/uL (ref 1.7–7.7)
Neutrophils Relative %: 60 %
Platelets: 221 10*3/uL (ref 150–400)
RBC: 4.2 MIL/uL — ABNORMAL LOW (ref 4.22–5.81)
RDW: 14.3 % (ref 11.5–15.5)
WBC: 4.2 10*3/uL (ref 4.0–10.5)
nRBC: 0 % (ref 0.0–0.2)

## 2022-09-13 LAB — BASIC METABOLIC PANEL
Anion gap: 4 — ABNORMAL LOW (ref 5–15)
BUN: 19 mg/dL (ref 6–20)
CO2: 24 mmol/L (ref 22–32)
Calcium: 8.7 mg/dL — ABNORMAL LOW (ref 8.9–10.3)
Chloride: 106 mmol/L (ref 98–111)
Creatinine, Ser: 1.72 mg/dL — ABNORMAL HIGH (ref 0.61–1.24)
GFR, Estimated: 51 mL/min — ABNORMAL LOW (ref >60)
Glucose, Bld: 82 mg/dL (ref 70–99)
Potassium: 3.8 mmol/L (ref 3.5–5.1)
Sodium: 134 mmol/L — ABNORMAL LOW (ref 135–145)

## 2022-09-13 LAB — PROTIME-INR
INR: 1.4 — ABNORMAL HIGH (ref 0.8–1.2)
Prothrombin Time: 16.9 seconds — ABNORMAL HIGH (ref 11.4–15.2)

## 2022-09-13 MED ORDER — SODIUM CHLORIDE 0.9 % IV SOLN: INTRAVENOUS | Status: DC

## 2022-09-13 MED ORDER — HYDROCODONE-ACETAMINOPHEN 5-325 MG PO TABS: 1.0000 | ORAL_TABLET | ORAL | Status: DC | PRN

## 2022-09-13 MED ORDER — MIDAZOLAM HCL 2 MG/2ML IJ SOLN
INTRAMUSCULAR | Status: AC
  Filled 2022-09-13: qty 2

## 2022-09-13 MED ORDER — FENTANYL CITRATE (PF) 100 MCG/2ML IJ SOLN
INTRAMUSCULAR | Status: AC | PRN
  Administered 2022-09-13 (×2): 50 ug via INTRAVENOUS

## 2022-09-13 MED ORDER — FENTANYL CITRATE (PF) 100 MCG/2ML IJ SOLN
INTRAMUSCULAR | Status: AC
  Filled 2022-09-13: qty 2

## 2022-09-13 MED ORDER — MIDAZOLAM HCL 2 MG/2ML IJ SOLN
INTRAMUSCULAR | Status: AC | PRN
  Administered 2022-09-13 (×2): 1 mg via INTRAVENOUS

## 2022-09-13 MED ORDER — LIDOCAINE HCL 1 % IJ SOLN
INTRAMUSCULAR | Status: AC
  Administered 2022-09-13: 10 mL
  Filled 2022-09-13: qty 20

## 2022-09-13 NOTE — Discharge Instructions (Signed)
Needle Biopsy  PROCEDURE:  1 hour  RECOVERY:  1 hour      Please call Interventional Radiology clinic [phone] with any questions or concerns.  You may remove your dressing and shower tomorrow.  Needle Biopsy, Care After These instructions tell you how to care for yourself after your procedure. Your doctor may also give you more specific instructions. Call your doctor if you have any problems or questions. What can I expect after the procedure? After the procedure, it is common to have: Soreness. Bruising. Mild pain. Follow these instructions at home:  Return to your normal activities as told by your doctor. Ask your doctor what activities are safe for you. Take over-the-counter and prescription medicines only as told by your doctor. Wash your hands with soap and water before you change your bandage (dressing). If you cannot use soap and water, use hand sanitizer. Follow instructions from your doctor about: How to take care of your puncture site. When and how to change your bandage. When to remove your bandage. Check your puncture site every day for signs of infection. Watch for: Redness, swelling, or pain. Fluid or blood.  Pus or a bad smell. Warmth. Do not take baths, swim, or use a hot tub until your doctor approves. Ask your doctor if you may take showers. You may only be allowed to take sponge baths. Keep all follow-up visits as told by your doctor. This is important. Contact a doctor if you have: A fever. Redness, swelling, or pain at the puncture site, and it lasts longer than a few days. Fluid, blood, or pus coming from the puncture site. Warmth coming from the puncture site. Get help right away if: You have a lot of bleeding from the puncture site. Summary After the procedure, it is common to have soreness, bruising, or mild pain at the puncture site. Check your puncture site every day for signs of infection, such as redness, swelling, or pain. Get help right  away if you have severe bleeding from your puncture site. This information is not intended to replace advice given to you by your health care provider. Make sure you discuss any questions you have with your health care provider. Document Revised: 07/23/2017 Document Reviewed: 07/23/2017 Elsevier Patient Education  [place].  Moderate Conscious Sedation, Adult, Care After This sheet gives you information about how to care for yourself after your procedure. Your health care provider may also give you more specific instructions. If you have problems or questions, contact your health careprovider. What can I expect after the procedure? After the procedure, it is common to have: Sleepiness for several hours. Impaired judgment for several hours. Difficulty with balance. Vomiting if you eat too soon. Follow these instructions at home: For the time period you were told by your health care provider: Rest. Do not participate in activities where you could fall or become injured. Do not drive or use machinery. Do not drink alcohol. Do not take sleeping pills or medicines that cause drowsiness. Do not make important decisions or sign legal documents. Do not take care of children on your own. Eating and drinking  Follow the diet recommended by your health care provider. Drink enough fluid to keep your urine pale yellow. If you vomit: Drink water, juice, or soup when you can drink without vomiting. Make sure you have little or no nausea before eating solid foods.  General instructions Take over-the-counter and prescription medicines only as told by your health care provider. Have a responsible  adult stay with you for the time you are told. It is important to have someone help care for you until you are awake and alert. Do not smoke. Keep all follow-up visits as told by your health care provider. This is important. Contact a health care provider if: You are still sleepy or having  trouble with balance after 24 hours. You feel light-headed. You keep feeling nauseous or you keep vomiting. You develop a rash. You have a fever. You have redness or swelling around the IV site. Get help right away if: You have trouble breathing. You have new-onset confusion at home. Summary After the procedure, it is common to feel sleepy, have impaired judgment, or feel nauseous if you eat too soon. Rest after you get home. Know the things you should not do after the procedure. Follow the diet recommended by your health care provider and drink enough fluid to keep your urine pale yellow. Get help right away if you have trouble breathing or new-onset confusion at home. This information is not intended to replace advice given to you by your health care provider. Make sure you discuss any questions you have with your healthcare provider. Document Revised: 11/07/2019 Document Reviewed: 06/05/2019 Elsevier Patient Education  2022 [employer].

## 2022-09-13 NOTE — Procedures (Signed)
  Procedure:  [place] core biopsy R axillary LAN 18g x4 in saline Preprocedure diagnosis: The encounter diagnosis was Axillary mass, right. Postprocedure diagnosis: same EBL:    minimal Complications:   none immediate  See full dictation in [employer].  Dillard Cannon MD Main # [phone] Pager  [phone] Mobile [phone]

## 2022-09-13 NOTE — Sedation Documentation (Signed)
Sample #3 obtained

## 2022-09-13 NOTE — Sedation Documentation (Signed)
Sample #4 obtained

## 2022-09-13 NOTE — Sedation Documentation (Signed)
Sample #2 obtained

## 2022-09-13 NOTE — Consult Note (Signed)
Chief Complaint: Patient was seen in consultation today for image guided biopsy of right axillary lymph node  Referring Physician(s): Gerkin,Todd  Supervising Physician: Arne Cleveland  Patient Status: [facility] - Out-pt  History of Present Illness: Lee [name] is a 42 y.o. male with PMH sig for CKD, HTN, HIV since 2018. Recent CT of neck on 06/08/22 revealed:  1. Bulky adenopathy in the deep right pectoral region, recommend chest CT with contrast. There was splenomegaly on abdominal CT from the summer, consider addition of abdominal CT is well. 2. Generalized thickening of the salivary glands, a usually from sialosis but given the adenopathy and symmetric lacrimal gland thickening systemic autoimmune process, granulomatous disease or lymphoma are also possible, need further workup. 3. Generalized active sinusitis with fluid levels.  CT chest on 07/01/22 revealed:  1. Bulky RIGHT axillary lymph node enlargement, with nodal mass measuring 7 cm in length. Findings suspicious for lymphoproliferative disease. 2. No acute intrathoracic abnormality. 3. Single 0.5 cm RIGHT upper lobe solid subpleural lymph node  He presents today for US guided biopsy of rt axillary LN for further evaluation.   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[place]) 07/24/2016   Shingles     Past Surgical History:  Procedure Laterality Date   CHOLECYSTECTOMY N/A 12/30/2021   Procedure: LAPAROSCOPIC CHOLECYSTECTOMY WITH  CHOLANGIOGRAM;  Surgeon: [name], MD;  Location: [facility] ORS;  Service: General;  Laterality: N/A;   LAPAROSCOPIC APPENDECTOMY N/A 03/12/2020   Procedure: APPENDECTOMY LAPAROSCOPIC;  Surgeon: Leighton Ruff, MD;  Location: [facility];  Service: General;  Laterality: N/A;   TYMPANOSTOMY  TUBE PLACEMENT     WISDOM TOOTH EXTRACTION     WRIST SURGERY      Allergies: Gabapentin, Lisinopril, and Nickel  Medications: Prior to Admission medications   Medication Sig Start Date End Date Taking? Authorizing Provider  albuterol (PROVENTIL HFA;VENTOLIN HFA) 108 (90 Base) MCG/ACT inhaler Inhale 2 puffs into the lungs every 6 (six) hours as needed for wheezing or shortness of breath. 09/14/17   Nafziger, Tommi Rumps, NP  amLODipine (NORVASC) 10 MG tablet Take 1 tablet by mouth once daily 09/05/22   Campbell Riches, MD  azelastine (ASTELIN) 0.1 % nasal spray 1-2 puffs in each nostril twice daily    Mosetta Anis, MD  azelastine (OPTIVAR) 0.05 % ophthalmic solution Apply to eye. 01/17/22   [provider]  [name] 50-200-25 MG TABS tablet TAKE 1 TABLET DAILY 03/28/22   Vienna Callas, NP  BREO ELLIPTA 200-25 MCG/ACT AEPB Inhale 1 puff into the lungs daily. 12/01/21   [provider]  EPINEPHrine 0.3 mg/0.3 mL IJ SOAJ injection Inject 0.3 mg into the muscle once.    Mosetta Anis, MD  montelukast (SINGULAIR) 10 MG tablet Take 10 mg by mouth at bedtime. 12/01/21   [provider]  ondansetron (ZOFRAN) 4 MG tablet Take 1 tablet (4 mg total) by mouth every 8 (eight) hours as needed for nausea or vomiting. 02/10/22   Nafziger, Tommi Rumps, NP  topiramate (TOPAMAX) 50 MG tablet Take 0.5 tablets (25 mg total) by mouth daily. 03/02/22   Nafziger, Tommi Rumps, NP  Vitamin D, Ergocalciferol, (DRISDOL) 1.25 MG (50000 UT) CAPS capsule Take 50,000 Units by mouth once a week. Friday 06/06/19   [provider]  Family History  Problem Relation Age of Onset   Hypercalcemia Mother    Hypertension Mother    Hypertension Father     Social History   Socioeconomic History   Marital status: Single    Spouse name: Not on file   Number of children: Not on file   Years of education: Not on file   Highest education level: Associate degree: academic program  Occupational History    Not on file  Tobacco Use   Smoking status: Never   Smokeless tobacco: Never  Vaping Use   Vaping Use: Never used  Substance and Sexual Activity   Alcohol use: No   Drug use: No   Sexual activity: Not Currently    Partners: Male    Birth control/protection: Condom    Comment: Given Condoms  Other Topics Concern   Not on file  Social History Narrative   Works as Nurse, 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   Social Determinants of Health   Financial Resource Strain: [place]  (02/09/2022)   Overall Financial Resource Strain (CARDIA)    Difficulty of Paying Living Expenses: Not hard at all  Food Insecurity: No [place] (06/27/2022)   Hunger Vital Sign    Worried About Running Out of Food in the Last Year: Never true    [place] in the Last Year: Never true  Transportation Needs: No Transportation Needs (06/27/2022)   PRAPARE - Hydrologist (Medical): No    Lack of Transportation (Non-Medical): No  Physical Activity: Unknown (02/09/2022)   Exercise Vital Sign    Days of Exercise per Week: 0 days    Minutes of Exercise per Session: Not on file  Stress: Stress Concern Present (02/09/2022)   [place]    Feeling of Stress : To some extent  Social Connections: Moderately Integrated (02/09/2022)   Social Connection and Isolation Panel [NHANES]    Frequency of Communication with Friends and Family: More than three times a week    Frequency of Social Gatherings with Friends and Family: Not on file    Attends Religious Services: More than 4 times per year    Active Member of [employer] or Organizations: Yes    Attends Music therapist: More than 4 times per year    Marital Status: Never married      Review of Systems denies fever,HA,CP,dyspnea, cough, abd/back pain,N/V or bleeding  Vital Signs: BP (!) 143/87   Pulse 92   Temp 97.9 F (36.6 C)   Resp 17   SpO2 99%   Code Status: FULL  CODE  Physical Exam awake/alert; chest- CTA bilat; heart- nl rate, irreg rhythm; abd- soft,+BS,NT; no LE edema  Imaging: No results found.  Labs:  CBC: Recent Labs    12/28/21 2107 12/30/21 0425 12/31/21 0521 01/20/22 0924  WBC 6.8 5.2 6.2 4.4  HGB 12.2* 11.9* 10.8* 12.1*  HCT 34.9* 35.6* 31.9* 34.6*  PLT 254 242 237 199.0    COAGS: No results for input(s): "INR", "APTT" in the last 8760 hours.  BMP: Recent Labs    12/28/21 2107 12/29/21 0921 12/30/21 0425 12/31/21 0521 01/20/22 0924  NA 130* 133* 132* 137 131*  K 3.7 4.2 4.3 4.0 4.1  CL 106 109 108 112* 101  CO2 17* 18* 18* 21* 24  GLUCOSE 91 79 80 103* 83  BUN 15 17 17 $ 21* 12  CALCIUM 8.5* 8.4* 8.5* 8.8* 9.0  CREATININE 1.67* 1.43* 1.46* 1.35* 1.39  GFRNONAA 53* >60 >60 >60  --     LIVER FUNCTION TESTS: Recent Labs    12/29/21 0921 12/30/21 0425 12/31/21 0521 01/20/22 0924  BILITOT 1.6* 1.5* 0.9 1.6*  AST 66* 63* 57* 55*  ALT 62* 56* 53* 41  ALKPHOS 250* 239* 224* 454*  PROT 10.2* 9.9* 9.5* 11.1*  ALBUMIN 2.2* 2.3* 2.1* 2.6*    TUMOR MARKERS: No results for input(s): "AFPTM", "CEA", "CA199", "CHROMGRNA" in the last 8760 hours.  Assessment and Plan: 42 y.o. male with PMH sig for CKD, HTN, HIV since 2018. Recent CT of neck on 06/08/22 revealed:  1. Bulky adenopathy in the deep right pectoral region, recommend chest CT with contrast. There was splenomegaly on abdominal CT from the summer, consider addition of abdominal CT is well. 2. Generalized thickening of the salivary glands, a usually from sialosis but given the adenopathy and symmetric lacrimal gland thickening systemic autoimmune process, granulomatous disease or lymphoma are also possible, need further workup. 3. Generalized active sinusitis with fluid levels.  CT chest on 07/01/22 revealed:  1. Bulky RIGHT axillary lymph node enlargement, with nodal mass measuring 7 cm in length. Findings suspicious for lymphoproliferative  disease. 2. No acute intrathoracic abnormality. 3. Single 0.5 cm RIGHT upper lobe solid subpleural lymph node  He presents today for US guided biopsy of rt axillary LN for further evaluation.Risks and benefits of procedure was discussed with the patient  including, but not limited to bleeding, infection, damage to adjacent structures or low yield requiring additional tests.  All of the questions were answered and there is agreement to proceed.  Consent signed and in chart.  LABS PENDING  Thank you for this interesting consult.  I greatly enjoyed meeting Grayden Holms and look forward to participating in their care.  A copy of this report was sent to the requesting provider on this date.  Electronically Signed: D. Rowe Robert, PA-C 09/13/2022, 12:21 PM   I spent a total of  20 minutes   in face to face in clinical consultation, greater than 50% of which was counseling/coordinating care for US guided biopsy of right axillary lymph node

## 2022-09-13 NOTE — Sedation Documentation (Signed)
Sample #1 obtained

## 2022-09-18 LAB — SURGICAL PATHOLOGY

## 2022-09-19 ENCOUNTER — Other Ambulatory Visit: Payer: Self-pay | Admitting: Infectious Diseases

## 2022-09-19 DIAGNOSIS — B20 Human immunodeficiency virus [HIV] disease: Secondary | ICD-10-CM

## 2022-09-27 ENCOUNTER — Telehealth: Payer: Self-pay | Admitting: Surgery

## 2022-09-27 NOTE — Telephone Encounter (Signed)
Telephone call to patient to discuss biopsy results from axillary lymph node.  Findings consistent with classical Hodgkin's lymphoma.  Will make referral to [facility] for consultation with medical oncology.  Will notify Dr. Redmond Baseman, Dr. Johnnye Sima, and Mr. Carlisle Cater of results.  [name], MD [facility] practice Office: [phone]

## 2022-09-28 ENCOUNTER — Telehealth: Payer: Self-pay | Admitting: Hematology and Oncology

## 2022-09-28 NOTE — Telephone Encounter (Signed)
scheduled per 3/6 referral, pt has been called and confirmed date and time. Pt is aware of location and to arrive early for check in

## 2022-10-02 ENCOUNTER — Other Ambulatory Visit: Payer: Self-pay | Admitting: Infectious Diseases

## 2022-10-02 DIAGNOSIS — I10 Essential (primary) hypertension: Secondary | ICD-10-CM

## 2022-10-06 ENCOUNTER — Inpatient Hospital Stay: Payer: BC Managed Care – PPO | Attending: Hematology and Oncology

## 2022-10-06 ENCOUNTER — Inpatient Hospital Stay (HOSPITAL_BASED_OUTPATIENT_CLINIC_OR_DEPARTMENT_OTHER): Payer: BC Managed Care – PPO | Admitting: Hematology and Oncology

## 2022-10-06 ENCOUNTER — Other Ambulatory Visit: Payer: Self-pay

## 2022-10-06 VITALS — BP 135/68 | HR 98 | Temp 98.4°F | Resp 17 | Wt 225.1 lb

## 2022-10-06 DIAGNOSIS — N183 Chronic kidney disease, stage 3 unspecified: Secondary | ICD-10-CM | POA: Diagnosis not present

## 2022-10-06 DIAGNOSIS — R161 Splenomegaly, not elsewhere classified: Secondary | ICD-10-CM | POA: Diagnosis not present

## 2022-10-06 DIAGNOSIS — C8174 Other classical Hodgkin lymphoma, lymph nodes of axilla and upper limb: Secondary | ICD-10-CM | POA: Insufficient documentation

## 2022-10-06 DIAGNOSIS — R61 Generalized hyperhidrosis: Secondary | ICD-10-CM | POA: Insufficient documentation

## 2022-10-06 DIAGNOSIS — R509 Fever, unspecified: Secondary | ICD-10-CM | POA: Diagnosis not present

## 2022-10-06 DIAGNOSIS — R682 Dry mouth, unspecified: Secondary | ICD-10-CM | POA: Diagnosis not present

## 2022-10-06 LAB — CBC WITH DIFFERENTIAL (CANCER CENTER ONLY)
Abs Immature Granulocytes: 0 10*3/uL (ref 0.00–0.07)
Basophils Absolute: 0 10*3/uL (ref 0.0–0.1)
Basophils Relative: 1 %
Eosinophils Absolute: 0 10*3/uL (ref 0.0–0.5)
Eosinophils Relative: 0 %
HCT: 34.8 % — ABNORMAL LOW (ref 39.0–52.0)
Hemoglobin: 12 g/dL — ABNORMAL LOW (ref 13.0–17.0)
Immature Granulocytes: 0 %
Lymphocytes Relative: 36 %
Lymphs Abs: 1.8 10*3/uL (ref 0.7–4.0)
MCH: 29 pg (ref 26.0–34.0)
MCHC: 34.5 g/dL (ref 30.0–36.0)
MCV: 84.1 fL (ref 80.0–100.0)
Monocytes Absolute: 0.3 10*3/uL (ref 0.1–1.0)
Monocytes Relative: 7 %
Neutro Abs: 2.9 10*3/uL (ref 1.7–7.7)
Neutrophils Relative %: 56 %
Platelet Count: 211 10*3/uL (ref 150–400)
RBC: 4.14 MIL/uL — ABNORMAL LOW (ref 4.22–5.81)
RDW: 14.2 % (ref 11.5–15.5)
WBC Count: 5.1 10*3/uL (ref 4.0–10.5)
nRBC: 0 % (ref 0.0–0.2)

## 2022-10-06 LAB — CMP (CANCER CENTER ONLY)
ALT: 12 U/L (ref 0–44)
AST: 23 U/L (ref 15–41)
Albumin: 2.6 g/dL — ABNORMAL LOW (ref 3.5–5.0)
Alkaline Phosphatase: 82 U/L (ref 38–126)
Anion gap: 4 — ABNORMAL LOW (ref 5–15)
BUN: 17 mg/dL (ref 6–20)
CO2: 24 mmol/L (ref 22–32)
Calcium: 8.9 mg/dL (ref 8.9–10.3)
Chloride: 102 mmol/L (ref 98–111)
Creatinine: 1.68 mg/dL — ABNORMAL HIGH (ref 0.61–1.24)
GFR, Estimated: 53 mL/min — ABNORMAL LOW (ref >60)
Glucose, Bld: 76 mg/dL (ref 70–99)
Potassium: 3.9 mmol/L (ref 3.5–5.1)
Sodium: 130 mmol/L — ABNORMAL LOW (ref 135–145)
Total Bilirubin: 0.3 mg/dL (ref 0.3–1.2)
Total Protein: >12 g/dL — ABNORMAL HIGH (ref 6.5–8.1)

## 2022-10-06 LAB — LACTATE DEHYDROGENASE: LDH: 524 U/L — ABNORMAL HIGH (ref 98–192)

## 2022-10-06 LAB — SEDIMENTATION RATE: Sed Rate: 27 mm/hr — ABNORMAL HIGH (ref 0–16)

## 2022-10-06 NOTE — Progress Notes (Signed)
[place] [phone]   Fax:(336) [place] NOTE  Patient Care Team: [name], NP as PCP - General (Family Medicine) Mosetta Anis, MD as Referring Physician (Allergy)  Hematological/Oncological History # Hodgkin's Lymphoma, Staging in Process 06/08/2022: CT neck showed Bulky adenopathy in the deep right pectoral region, recommend chest CT with contrast. There was splenomegaly on abdominal CT from the summer, consider addition of abdominal CT is well. 07/01/2023: CT chest showed Bulky RIGHT axillary lymph node enlargement, with nodal mass measuring 7 cm in length. Findings suspicious for lymphoproliferative disease 09/13/2022: US guided biopsy of right axillary lymphadenopathy was consistent with classical Hodgkin's lymphoma  10/06/2022: establish care with Dr. Lorenso Courier   CHIEF COMPLAINTS/PURPOSE OF CONSULTATION:  "Hodgkin's Lymphoma "  HISTORY OF PRESENTING ILLNESS:  Lee [name] 42 y.o. male with medical history significant for CKD, eczema, HIV, and HTN who presents for evaluation of newly diagnosed Hodgkin's Lymphoma.   On review of the previous records Lee [name] was initially noted to have bulky lymphadenopathy on 06/08/2022.  CT neck showed bulky adenopathy.  Also reported that splenomegaly was noted on his CT scan from earlier that summer.  On 07/01/2023 he underwent a CT scan of the chest which showed bulky right axillary lymphadenopathy with nodal enlargement.  Ultrasound-guided biopsy of the right axillar lymphadenopathy was consistent with classical Hodgkin's lymphoma.  Due to concern for these findings he was referred to hematology for further evaluation and management.  On exam today Lee [name] reports that he has been having severe symptoms including night sweats and dry mouth.  He notes that he can sweats so heavily sometimes he has to change his sheets and pajamas.  He reports that it is happened at least twice this week  and has been happening frequently.  He notes that since his gallbladder was removed "everything went wrong".  He notes that he had a weight loss down to 209 pounds but is currently rebounding up to 225 pounds.  He also notes he is having swelling in his eyes and face.  On further discussion he reports that his father had prostate cancer and he has a healthy sister.  He has no children and does not have interest in having children.  He reports that he is a never smoker but does drink alcohol about once every 6 months.  He currently works for [employer] in Therapist, art.  He does endorse having fevers, chills, and sweats.  He is not having any nausea, vomiting, or diarrhea.  He does endorse bulky lymphadenopathy.  A full 10 point ROS was otherwise negative.  MEDICAL HISTORY:  Past Medical History:  Diagnosis Date   CKD (chronic kidney disease) stage 3, GFR 30-59 ml/min (HCC) 02/20/2019   Eczema    Environmental allergies    Essential hypertension    HIV test positive ([place])    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[place]) 07/24/2016   Shingles     SURGICAL HISTORY: Past Surgical History:  Procedure Laterality Date   CHOLECYSTECTOMY N/A 12/30/2021   Procedure: LAPAROSCOPIC CHOLECYSTECTOMY WITH  CHOLANGIOGRAM;  Surgeon: [name], MD;  Location: [facility];  Service: General;  Laterality: N/A;   LAPAROSCOPIC APPENDECTOMY N/A 03/12/2020   Procedure: APPENDECTOMY LAPAROSCOPIC;  Surgeon: Leighton Ruff, MD;  Location: [facility];  Service: General;  Laterality: N/A;   TYMPANOSTOMY TUBE PLACEMENT     WISDOM TOOTH EXTRACTION     WRIST SURGERY      SOCIAL HISTORY: Social History  Socioeconomic History   Marital status: Single    Spouse name: Not on file   Number of children: Not on file   Years of education: Not on file   Highest education level: Associate degree: academic program  Occupational History   Not on file  Tobacco Use   Smoking status: Never   Smokeless tobacco: Never   Vaping Use   Vaping Use: Never used  Substance and Sexual Activity   Alcohol use: No   Drug use: No   Sexual activity: Not Currently    Partners: Male    Birth control/protection: Condom    Comment: Given Condoms  Other Topics Concern   Not on file  Social History Narrative   Works as Nurse, 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   Social Determinants of Health   Financial Resource Strain: [place]  (02/09/2022)   Overall Financial Resource Strain (CARDIA)    Difficulty of Paying Living Expenses: Not hard at all  Food Insecurity: No Food Insecurity (06/27/2022)   Hunger Vital Sign    Worried About Running Out of Food in the Last Year: Never true    [place] in the Last Year: Never true  Transportation Needs: No Transportation Needs (06/27/2022)   PRAPARE - Hydrologist (Medical): No    Lack of Transportation (Non-Medical): No  Physical Activity: Unknown (02/09/2022)   Exercise Vital Sign    Days of Exercise per Week: 0 days    Minutes of Exercise per Session: Not on file  Stress: Stress Concern Present (02/09/2022)   [place]    Feeling of Stress : To some extent  Social Connections: Moderately Integrated (02/09/2022)   Social Connection and Isolation Panel [NHANES]    Frequency of Communication with Friends and Family: More than three times a week    Frequency of Social Gatherings with Friends and Family: Not on file    Attends Religious Services: More than 4 times per year    Active Member of [employer] or Organizations: Yes    Attends Music therapist: More than 4 times per year    Marital Status: Never married  Human resources officer Violence: Not on file    FAMILY HISTORY: Family History  Problem Relation Age of Onset   Hypercalcemia Mother    Hypertension Mother    Hypertension Father     ALLERGIES:  is allergic to gabapentin, lisinopril, and nickel.  MEDICATIONS:  Current  Outpatient Medications  Medication Sig Dispense Refill   amLODipine (NORVASC) 10 MG tablet Take 1 tablet by mouth once daily 30 tablet 0   albuterol (PROVENTIL HFA;VENTOLIN HFA) 108 (90 Base) MCG/ACT inhaler Inhale 2 puffs into the lungs every 6 (six) hours as needed for wheezing or shortness of breath. 1 Inhaler 1   azelastine (ASTELIN) 0.1 % nasal spray 1-2 puffs in each nostril twice daily     azelastine (OPTIVAR) 0.05 % ophthalmic solution Apply to eye.     [name] 50-200-25 MG TABS tablet TAKE 1 TABLET DAILY 30 tablet 5   BREO ELLIPTA 200-25 MCG/ACT AEPB Inhale 1 puff into the lungs daily.     EPINEPHrine 0.3 mg/0.3 mL IJ SOAJ injection Inject 0.3 mg into the muscle once.     montelukast (SINGULAIR) 10 MG tablet Take 10 mg by mouth at bedtime.     ondansetron (ZOFRAN) 4 MG tablet Take 1 tablet (4 mg total) by mouth every 8 (eight) hours  as needed for nausea or vomiting. 20 tablet 1   topiramate (TOPAMAX) 50 MG tablet Take 0.5 tablets (25 mg total) by mouth daily. 30 tablet 0   Vitamin D, Ergocalciferol, (DRISDOL) 1.25 MG (50000 UT) CAPS capsule Take 50,000 Units by mouth once a week. Friday     No current facility-administered medications for this visit.    REVIEW OF SYSTEMS:   All other systems were reviewed with the patient and are negative.  PHYSICAL EXAMINATION:  Vitals:   10/06/22 1315  BP: 135/68  Pulse: 98  Resp: 17  Temp: 98.4 F (36.9 C)  SpO2: 100%   Filed Weights   10/06/22 1315  Weight: 225 lb 1.6 oz (102.1 kg)    GENERAL: well appearing middle-aged African-American male in NAD.  Extensive swelling of face and eyelids bilaterally. LYMPH: Firm lymph nodes in the preauricular region as well as right axillary, supraclavicular, and cervical SKIN: skin color, texture, turgor are normal, no rashes or significant lesions EYES: conjunctiva are pink and non-injected, sclera clear LUNGS: clear to auscultation and percussion with normal breathing effort HEART: regular  rate & rhythm and no murmurs and no lower extremity edema Musculoskeletal: no cyanosis of digits and no clubbing  PSYCH: alert & oriented x 3, fluent speech NEURO: no focal motor/sensory deficits  LABORATORY DATA:  I have reviewed the data as listed    Latest Ref Rng & Units 10/06/2022    2:59 PM 09/13/2022   12:43 PM 01/20/2022    9:24 AM  CBC  WBC 4.0 - 10.5 K/uL 5.1  4.2  4.4   Hemoglobin 13.0 - 17.0 g/dL 12.0  12.1  12.1   Hematocrit 39.0 - 52.0 % 34.8  36.5  34.6   Platelets 150 - 400 K/uL 211  221  199.0        Latest Ref Rng & Units 10/06/2022    2:59 PM 09/13/2022   12:43 PM 01/20/2022    9:24 AM  CMP  Glucose 70 - 99 mg/dL 76  82  83   BUN 6 - 20 mg/dL 17  19  12    Creatinine 0.61 - 1.24 mg/dL 1.68  1.72  1.39   Sodium 135 - 145 mmol/L 130  134  131   Potassium 3.5 - 5.1 mmol/L 3.9  3.8  4.1   Chloride 98 - 111 mmol/L 102  106  101   CO2 22 - 32 mmol/L 24  24  24    Calcium 8.9 - 10.3 mg/dL 8.9  8.7  9.0   Total Protein 6.5 - 8.1 g/dL >12.0   11.1   Total Bilirubin 0.3 - 1.2 mg/dL 0.3   1.6   Alkaline Phos 38 - 126 U/L 82   454   AST 15 - 41 U/L 23   55   ALT 0 - 44 U/L 12   41      ASSESSMENT & PLAN Lee [name] 42 y.o. male with medical history significant for CKD, eczema, HIV, and HTN who presents for evaluation of newly diagnosed Hodgkin's Lymphoma.   After review of the labs, review of the records, and discussion with the patient the patients findings are most consistent with classical Hodgkin's Lymphoma.   # Hodgkin's Lymphoma, Staging in Process -- Will complete staging with a PET CT scan -- Pending the results of the CT scan will determine treatment options moving forward.  He will either require ABVD versus A-AVD  -- Patient require port placement, echocardiogram, and PFTs --will  order baseline labs to include CBC, CMP, ESR, and LDH -- Return to clinic once the above studies have been completed.  #Supportive Care -- chemotherapy education to be  scheduled  -- port placement to be scheduled.  -- zofran 8mg  q8H PRN and compazine 10mg  PO q6H for nausea -- EMLA cream for port -- no pain medication required at this time.    Orders Placed This Encounter  Procedures   NM PET Image Initial (PI) Skull Base To Thigh    Standing Status:   Future    Standing Expiration Date:   10/08/2023    Order Specific Question:   If indicated for the ordered procedure, I authorize the administration of a radiopharmaceutical per Radiology protocol    Answer:   Yes    Order Specific Question:   Preferred imaging location?    Answer:   [place]   CBC with Differential (Cancer Center Only)    Standing Status:   Future    Number of Occurrences:   1    Standing Expiration Date:   10/06/2023   CMP ([place] only)    Standing Status:   Future    Number of Occurrences:   1    Standing Expiration Date:   10/06/2023   Lactate dehydrogenase (LDH)    Standing Status:   Future    Number of Occurrences:   1    Standing Expiration Date:   10/06/2023   Sedimentation rate    Standing Status:   Future    Number of Occurrences:   1    Standing Expiration Date:   10/06/2023   ECHOCARDIOGRAM COMPLETE    Standing Status:   Future    Standing Expiration Date:   10/08/2023    Order Specific Question:   Where should this test be performed    Answer:   [place]    Order Specific Question:   Perflutren DEFINITY (image enhancing agent) should be administered unless hypersensitivity or allergy exist    Answer:   Administer Perflutren    Order Specific Question:   Is a special reader required? (athlete or structural heart)    Answer:   No    Order Specific Question:   Does this study need to be read by the Structural team/Level 3 readers?    Answer:   No    Order Specific Question:   Reason for exam-Echo    Answer:   Chemo  Z09   Pulmonary Function Test    Required prior to chemotherapy    Standing Status:   Future    Standing Expiration Date:   10/08/2023     Order Specific Question:   Where should this test be performed?    Answer:   [facility]    Order Specific Question:   Full PFT: includes the following: basic spirometry, spirometry pre & post bronchodilator, diffusion capacity (DLCO), lung volumes    Answer:   Full PFT    All questions were answered. The patient knows to call the clinic with any problems, questions or concerns.  A total of more than 60 minutes were spent on this encounter with face-to-face time and non-face-to-face time, including preparing to see the patient, ordering tests and/or medications, counseling the patient and coordination of care as outlined above.   Ledell Peoples, MD Department of Hematology/Oncology [place] at [facility] Phone: [phone] Pager: [phone] Email: [email or web address]  10/08/2022 12:38 PM

## 2022-10-08 MED ORDER — LIDOCAINE-PRILOCAINE 2.5-2.5 % EX CREA: 1.0000 | TOPICAL_CREAM | CUTANEOUS | 0 refills | Status: DC | PRN

## 2022-10-08 MED ORDER — ONDANSETRON HCL 8 MG PO TABS: 8.0000 mg | ORAL_TABLET | Freq: Three times a day (TID) | ORAL | 0 refills | Status: DC | PRN

## 2022-10-08 MED ORDER — PROCHLORPERAZINE MALEATE 10 MG PO TABS: 10.0000 mg | ORAL_TABLET | Freq: Four times a day (QID) | ORAL | 0 refills | Status: DC | PRN

## 2022-10-09 ENCOUNTER — Telehealth: Payer: Self-pay | Admitting: Hematology and Oncology

## 2022-10-09 NOTE — Telephone Encounter (Signed)
Per 3/18 IB reached out to patient to schedule Chemo Edu. Patient aware of date and time of appointment.

## 2022-10-10 ENCOUNTER — Telehealth: Payer: Self-pay | Admitting: Hematology and Oncology

## 2022-10-10 ENCOUNTER — Other Ambulatory Visit: Payer: Self-pay | Admitting: Hematology and Oncology

## 2022-10-10 NOTE — Telephone Encounter (Signed)
Called patient per 3/19 IB message to reschedule patient education for after scan results have come back. Patient rescheduled and notified.

## 2022-10-12 ENCOUNTER — Other Ambulatory Visit: Payer: BC Managed Care – PPO

## 2022-10-16 ENCOUNTER — Ambulatory Visit (HOSPITAL_COMMUNITY)
Admission: RE | Admit: 2022-10-16 | Discharge: 2022-10-16 | Disposition: A | Discharge status: Home / Self Care | Payer: BC Managed Care – PPO | Source: Ambulatory Visit | Attending: Hematology and Oncology | Admitting: Hematology and Oncology

## 2022-10-16 DIAGNOSIS — Z01818 Encounter for other preprocedural examination: Secondary | ICD-10-CM | POA: Insufficient documentation

## 2022-10-16 DIAGNOSIS — N189 Chronic kidney disease, unspecified: Secondary | ICD-10-CM | POA: Diagnosis not present

## 2022-10-16 DIAGNOSIS — Z21 Asymptomatic human immunodeficiency virus [HIV] infection status: Secondary | ICD-10-CM | POA: Insufficient documentation

## 2022-10-16 DIAGNOSIS — C8174 Other classical Hodgkin lymphoma, lymph nodes of axilla and upper limb: Secondary | ICD-10-CM

## 2022-10-16 DIAGNOSIS — Z0189 Encounter for other specified special examinations: Secondary | ICD-10-CM

## 2022-10-16 DIAGNOSIS — I1310 Hypertensive heart and chronic kidney disease without heart failure, with stage 1 through stage 4 chronic kidney disease, or unspecified chronic kidney disease: Secondary | ICD-10-CM | POA: Diagnosis not present

## 2022-10-16 LAB — ECHOCARDIOGRAM COMPLETE
Area-P 1/2: 2.93 cm2
S' Lateral: 3.4 cm

## 2022-10-16 NOTE — Progress Notes (Signed)
  Echocardiogram 2D Echocardiogram has been performed.  Lee [name] 10/16/2022, 8:44 AM

## 2022-10-17 ENCOUNTER — Other Ambulatory Visit: Payer: Self-pay | Admitting: Student

## 2022-10-17 DIAGNOSIS — C8174 Other classical Hodgkin lymphoma, lymph nodes of axilla and upper limb: Secondary | ICD-10-CM

## 2022-10-18 ENCOUNTER — Encounter (HOSPITAL_COMMUNITY): Payer: Self-pay

## 2022-10-18 ENCOUNTER — Other Ambulatory Visit: Payer: Self-pay

## 2022-10-18 ENCOUNTER — Ambulatory Visit (HOSPITAL_COMMUNITY)
Admission: RE | Admit: 2022-10-18 | Discharge: 2022-10-18 | Disposition: A | Discharge status: Home / Self Care | Payer: BC Managed Care – PPO | Source: Ambulatory Visit | Attending: Interventional Radiology | Admitting: Interventional Radiology

## 2022-10-18 ENCOUNTER — Ambulatory Visit (HOSPITAL_COMMUNITY)
Admission: RE | Admit: 2022-10-18 | Discharge: 2022-10-18 | Disposition: A | Discharge status: Home / Self Care | Payer: BC Managed Care – PPO | Source: Ambulatory Visit | Attending: Hematology and Oncology | Admitting: Hematology and Oncology

## 2022-10-18 DIAGNOSIS — Z452 Encounter for adjustment and management of vascular access device: Secondary | ICD-10-CM | POA: Diagnosis not present

## 2022-10-18 DIAGNOSIS — I129 Hypertensive chronic kidney disease with stage 1 through stage 4 chronic kidney disease, or unspecified chronic kidney disease: Secondary | ICD-10-CM | POA: Insufficient documentation

## 2022-10-18 DIAGNOSIS — C8174 Other classical Hodgkin lymphoma, lymph nodes of axilla and upper limb: Secondary | ICD-10-CM | POA: Insufficient documentation

## 2022-10-18 DIAGNOSIS — Z21 Asymptomatic human immunodeficiency virus [HIV] infection status: Secondary | ICD-10-CM | POA: Diagnosis not present

## 2022-10-18 DIAGNOSIS — N183 Chronic kidney disease, stage 3 unspecified: Secondary | ICD-10-CM | POA: Insufficient documentation

## 2022-10-18 DIAGNOSIS — Z8249 Family history of ischemic heart disease and other diseases of the circulatory system: Secondary | ICD-10-CM | POA: Diagnosis not present

## 2022-10-18 DIAGNOSIS — C8590 Non-Hodgkin lymphoma, unspecified, unspecified site: Secondary | ICD-10-CM | POA: Diagnosis not present

## 2022-10-18 HISTORY — PX: IR IMAGING GUIDED PORT INSERTION: IMG5740

## 2022-10-18 MED ORDER — LIDOCAINE-EPINEPHRINE 1 %-1:100000 IJ SOLN
INTRAMUSCULAR | Status: AC
  Administered 2022-10-18: 15 mL via INTRADERMAL
  Filled 2022-10-18: qty 1

## 2022-10-18 MED ORDER — HEPARIN SOD (PORK) LOCK FLUSH 100 UNIT/ML IV SOLN
INTRAVENOUS | Status: AC
  Administered 2022-10-18: 500 [IU]
  Filled 2022-10-18: qty 5

## 2022-10-18 MED ORDER — MIDAZOLAM HCL 2 MG/2ML IJ SOLN
INTRAMUSCULAR | Status: AC
  Filled 2022-10-18: qty 2

## 2022-10-18 MED ORDER — MIDAZOLAM HCL 2 MG/2ML IJ SOLN
INTRAMUSCULAR | Status: AC | PRN
  Administered 2022-10-18 (×2): 1 mg via INTRAVENOUS

## 2022-10-18 MED ORDER — FENTANYL CITRATE (PF) 100 MCG/2ML IJ SOLN
INTRAMUSCULAR | Status: AC
  Filled 2022-10-18: qty 2

## 2022-10-18 MED ORDER — SODIUM CHLORIDE 0.9 % IV SOLN: INTRAVENOUS | Status: DC

## 2022-10-18 MED ORDER — FENTANYL CITRATE (PF) 100 MCG/2ML IJ SOLN
INTRAMUSCULAR | Status: AC | PRN
  Administered 2022-10-18 (×2): 50 ug via INTRAVENOUS

## 2022-10-18 NOTE — Procedures (Signed)
Pre Procedure Dx: Poor venous access Post Procedural Dx: Same  Successful placement of right IJ approach port-a-cath with tip at the superior caval atrial junction. The catheter is ready for immediate use.  Estimated Blood Loss: Trace  Complications: None immediate.  Jay Viyaan Champine, MD Pager #: [phone]   

## 2022-10-18 NOTE — H&P (Signed)
Chief Complaint: Patient was seen in consultation today for Hodgkin's lymphoma  Referring Physician(s): Dorsey,John T IV  Supervising Physician: Sandi Mariscal  Patient Status: [facility] - Out-pt  History of Present Illness: Lee [name] is a 42 y.o. male with a past medical history significant for HIV, HTN, CKD and recently diagnosed Hodgkin's lymphoma who presents today for port placement. Lee [name] as found to have lymphadenopathy in the right axilla and deep right pectoral region concerning for lymphoproliferative disorder on imaging obtained in late 2023. He underwent US guided biopsy of the right axillary with Dr. Vernard Gambles on 09/13/22 and pathology was consistent with Hodgkin's lymphoma. He was referred to medical oncology and is planned to begin systemic therapy. He returns to IR today for port placement.  Past Medical History:  Diagnosis Date   CKD (chronic kidney disease) stage 3, GFR 30-59 ml/min (HCC) 02/20/2019   Eczema    Environmental allergies    Essential hypertension    HIV test positive ([place])    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[place]) 07/24/2016   Shingles     Past Surgical History:  Procedure Laterality Date   CHOLECYSTECTOMY N/A 12/30/2021   Procedure: LAPAROSCOPIC CHOLECYSTECTOMY WITH  CHOLANGIOGRAM;  Surgeon: [name], MD;  Location: [facility];  Service: General;  Laterality: N/A;   LAPAROSCOPIC APPENDECTOMY N/A 03/12/2020   Procedure: APPENDECTOMY LAPAROSCOPIC;  Surgeon: Leighton Ruff, MD;  Location: [facility];  Service: General;  Laterality: N/A;   TYMPANOSTOMY TUBE PLACEMENT     WISDOM TOOTH EXTRACTION     WRIST SURGERY      Allergies: Gabapentin, Lisinopril, and Nickel  Medications: Prior to Admission medications   Medication Sig Start Date End Date Taking? Authorizing Provider  amLODipine (NORVASC) 10 MG tablet Take 1 tablet by mouth once daily 10/06/22   Campbell Riches, MD  albuterol (PROVENTIL HFA;VENTOLIN HFA) 108 (90 Base) MCG/ACT inhaler  Inhale 2 puffs into the lungs every 6 (six) hours as needed for wheezing or shortness of breath. 09/14/17   Nafziger, Tommi Rumps, NP  azelastine (ASTELIN) 0.1 % nasal spray 1-2 puffs in each nostril twice daily    Mosetta Anis, MD  azelastine (OPTIVAR) 0.05 % ophthalmic solution Apply to eye. 01/17/22   [provider]  [name] 50-200-25 MG TABS tablet TAKE 1 TABLET DAILY 09/19/22   Velna Ochs, MD  BREO ELLIPTA 200-25 MCG/ACT AEPB Inhale 1 puff into the lungs daily. 12/01/21   [provider]  EPINEPHrine 0.3 mg/0.3 mL IJ SOAJ injection Inject 0.3 mg into the muscle once.    Mosetta Anis, MD  lidocaine-prilocaine (EMLA) cream Apply 1 Application topically as needed. 10/08/22   Orson Slick, MD  montelukast (SINGULAIR) 10 MG tablet Take 10 mg by mouth at bedtime. 12/01/21   [provider]  ondansetron (ZOFRAN) 8 MG tablet Take 1 tablet (8 mg total) by mouth every 8 (eight) hours as needed. 10/08/22   Orson Slick, MD  prochlorperazine (COMPAZINE) 10 MG tablet Take 1 tablet (10 mg total) by mouth every 6 (six) hours as needed for nausea or vomiting. 10/08/22   Orson Slick, MD  topiramate (TOPAMAX) 50 MG tablet Take 0.5 tablets (25 mg total) by mouth daily. 03/02/22   Nafziger, Tommi Rumps, NP  Vitamin D, Ergocalciferol, (DRISDOL) 1.25 MG (50000 UT) CAPS capsule Take 50,000 Units by mouth once a week. Friday 06/06/19   [provider]     Family History  Problem Relation Age of Onset   Hypercalcemia  Mother    Hypertension Mother    Hypertension Father     Social History   Socioeconomic History   Marital status: Single    Spouse name: Not on file   Number of children: Not on file   Years of education: Not on file   Highest education level: Associate degree: academic program  Occupational History   Not on file  Tobacco Use   Smoking status: Never   Smokeless tobacco: Never  Vaping Use   Vaping Use: Never used  Substance and Sexual Activity    Alcohol use: No   Drug use: No   Sexual activity: Not Currently    Partners: Male    Birth control/protection: Condom    Comment: Given Condoms  Other Topics Concern   Not on file  Social History Narrative   Works as Nurse, 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   Social Determinants of Health   Financial Resource Strain: [place]  (02/09/2022)   Overall Financial Resource Strain (CARDIA)    Difficulty of Paying Living Expenses: Not hard at all  Food Insecurity: No Food Insecurity (06/27/2022)   Hunger Vital Sign    Worried About Running Out of Food in the Last Year: Never true    [place] in the Last Year: Never true  Transportation Needs: No Transportation Needs (06/27/2022)   PRAPARE - Hydrologist (Medical): No    Lack of Transportation (Non-Medical): No  Physical Activity: Unknown (02/09/2022)   Exercise Vital Sign    Days of Exercise per Week: 0 days    Minutes of Exercise per Session: Not on file  Stress: Stress Concern Present (02/09/2022)   [place]    Feeling of Stress : To some extent  Social Connections: Moderately Integrated (02/09/2022)   Social Connection and Isolation Panel [NHANES]    Frequency of Communication with Friends and Family: More than three times a week    Frequency of Social Gatherings with Friends and Family: Not on file    Attends Religious Services: More than 4 times per year    Active Member of Clubs or Organizations: Yes    Attends Music therapist: More than 4 times per year    Marital Status: Never married       Review of Systems denies fever,CP,dyspnea, cough, abd/back pain,N/V or bleeding; does have mild HA  Vital Signs: Vitals:   10/18/22 1210  BP: 134/85  Pulse: 96  Resp: 16  Temp: 99.7 F (37.6 C)  SpO2: 100%     Physical Exam: awake/alert; chest- CTA bilat; heart- RRR; abd- soft,+BS,NT; no LE edema;facial /eyelid edema, adenopathy  preauricular, cervical, supraclavicular regions       Imaging: ECHOCARDIOGRAM COMPLETE  Result Date: 10/16/2022    ECHOCARDIOGRAM REPORT   Patient Name:   Lee [name] [facility] Date of Exam: 10/16/2022 Medical Rec #:  [id number]          Height:       76.0 in Accession #:    [id number]         Weight:       225.1 lb Date of Birth:  Apr 01, 1981          BSA:          2.329 m Patient Age:    29 years           BP:  155/79 mmHg Patient Gender: M                  HR:           66 bpm. Exam Location:  Outpatient Procedure: 2D Echo, 3D Echo, Cardiac Doppler, Color Doppler and Strain Analysis Indications:    Chemo  History:        Patient has prior history of Echocardiogram examinations, most                 recent 09/10/2017. Risk Factors:Hypertension. HIV positive, CKD.  Sonographer:    Eartha Inch Referring Phys: [id number] Madie Reno DORSEY IV  Sonographer Comments: Image acquisition challenging due to respiratory motion and Image acquisition challenging due to patient body habitus. Global longitudinal strain was attempted. IMPRESSIONS  1. Left ventricular ejection fraction, by estimation, is 55 to 60%. The left ventricle has normal function. The left ventricle has no regional wall motion abnormalities. There is mild concentric left ventricular hypertrophy. Left ventricular diastolic parameters were normal. The average left ventricular global longitudinal strain is -17.8 %. The global longitudinal strain is normal.  2. Right ventricular systolic function is normal. The right ventricular size is normal. Tricuspid regurgitation signal is inadequate for assessing PA pressure.  3. The mitral valve is normal in structure. No evidence of mitral valve regurgitation. No evidence of mitral stenosis.  4. The aortic valve is tricuspid. Aortic valve regurgitation is not visualized. No aortic stenosis is present.  5. Aortic dilatation noted. There is borderline dilatation of the aortic root, measuring 38 mm.  6. The  inferior vena cava is dilated in size with >50% respiratory variability, suggesting right atrial pressure of 8 mmHg. Comparison(s): No prior Echocardiogram. FINDINGS  Left Ventricle: Left ventricular ejection fraction, by estimation, is 55 to 60%. The left ventricle has normal function. The left ventricle has no regional wall motion abnormalities. The average left ventricular global longitudinal strain is -17.8 %. The global longitudinal strain is normal. The left ventricular internal cavity size was normal in size. There is mild concentric left ventricular hypertrophy. Left ventricular diastolic parameters were normal. Right Ventricle: The right ventricular size is normal. Right ventricular systolic function is normal. Tricuspid regurgitation signal is inadequate for assessing PA pressure. Left Atrium: Left atrial size was normal in size. Right Atrium: Right atrial size was normal in size. Pericardium: Trivial pericardial effusion is present. Mitral Valve: The mitral valve is normal in structure. No evidence of mitral valve regurgitation. No evidence of mitral valve stenosis. Tricuspid Valve: The tricuspid valve is normal in structure. Tricuspid valve regurgitation is trivial. No evidence of tricuspid stenosis. Aortic Valve: The aortic valve is tricuspid. Aortic valve regurgitation is not visualized. No aortic stenosis is present. Pulmonic Valve: The pulmonic valve was normal in structure. Pulmonic valve regurgitation is trivial. No evidence of pulmonic stenosis. Aorta: Aortic dilatation noted. There is borderline dilatation of the aortic root, measuring 38 mm. Venous: The inferior vena cava is dilated in size with greater than 50% respiratory variability, suggesting right atrial pressure of 8 mmHg. IAS/Shunts: No atrial level shunt detected by color flow Doppler.  LEFT VENTRICLE PLAX 2D LVIDd:         5.00 cm   Diastology LVIDs:         3.40 cm   LV e' medial:    6.74 cm/s LV PW:         1.60 cm   LV E/e' medial:   10.9 LV IVS:  1.00 cm   LV e' lateral:   11.50 cm/s LVOT diam:     2.40 cm   LV E/e' lateral: 6.4 LV SV:         103 LV SV Index:   44        2D Longitudinal Strain LVOT Area:     4.52 cm  2D Strain GLS Avg:     -17.8 %                           3D Volume EF:                          3D EF:        58 %                          LV EDV:       157 ml                          LV ESV:       66 ml                          LV SV:        91 ml RIGHT VENTRICLE             IVC RV S prime:     13.60 cm/s  IVC diam: 2.50 cm TAPSE (M-mode): 2.6 cm LEFT ATRIUM             Index        RIGHT ATRIUM           Index LA diam:        3.00 cm 1.29 cm/m   RA Area:     19.20 cm LA Vol (A2C):   77.0 ml 33.06 ml/m  RA Volume:   60.90 ml  26.14 ml/m LA Vol (A4C):   37.6 ml 16.14 ml/m LA Biplane Vol: 55.9 ml 24.00 ml/m  AORTIC VALVE LVOT Vmax:   121.00 cm/s LVOT Vmean:  85.900 cm/s LVOT VTI:    0.228 m  AORTA Ao Root diam: 3.80 cm Ao Asc diam:  3.40 cm MITRAL VALVE MV Area (PHT): 2.93 cm    SHUNTS MV Decel Time: 259 msec    Systemic VTI:  0.23 m MV E velocity: 73.70 cm/s  Systemic Diam: 2.40 cm Kirk Ruths MD Electronically signed by Kirk Ruths MD Signature Date/Time: 10/16/2022/8:54:36 AM    Final     Labs:  CBC: Recent Labs    12/31/21 0521 01/20/22 0924 09/13/22 1243 10/06/22 1459  WBC 6.2 4.4 4.2 5.1  HGB 10.8* 12.1* 12.1* 12.0*  HCT 31.9* 34.6* 36.5* 34.8*  PLT 237 199.0 221 211    COAGS: Recent Labs    09/13/22 1243  INR 1.4*    BMP: Recent Labs    12/30/21 0425 12/31/21 0521 01/20/22 0924 09/13/22 1243 10/06/22 1459  NA 132* 137 131* 134* 130*  K 4.3 4.0 4.1 3.8 3.9  CL 108 112* 101 106 102  CO2 18* 21* 24 24 24   GLUCOSE 80 103* 83 82 76  BUN 17 21* 12 19 17   CALCIUM 8.5* 8.8* 9.0 8.7* 8.9  CREATININE 1.46* 1.35* 1.39 1.72* 1.68*  GFRNONAA >60 >60  --  51* 53*    LIVER FUNCTION  TESTS: Recent Labs    12/30/21 0425 12/31/21 0521 01/20/22 0924 10/06/22 1459  BILITOT  1.5* 0.9 1.6* 0.3  AST 63* 57* 55* 23  ALT 56* 53* 41 12  ALKPHOS 239* 224* 454* 82  PROT 9.9* 9.5* 11.1* >12.0*  ALBUMIN 2.3* 2.1* 2.6* 2.6*    TUMOR MARKERS: No results for input(s): "AFPTM", "CEA", "CA199", "CHROMGRNA" in the last 8760 hours.  Assessment and Plan:  42 y/o M with recently diagnosed Hodgkin's lymphoma who presents today for port placement prior to beginning systemic therapy.  Risks and benefits of image-guided Port-a-catheter placement were discussed with the patient including, but not limited to bleeding, infection, pneumothorax, or fibrin sheath development and need for additional procedures.  All of the patient's questions were answered, patient is agreeable to proceed.  Consent signed and in chart.  Thank you for this interesting consult.  I greatly enjoyed meeting Cora Zapanta and look forward to participating in their care.  A copy of this report was sent to the requesting provider on this date.  Electronically Signed: Joaquim Nam, PA-C/Kevin Yanilen Adamik,PA-C 10/18/2022, 12:07 PM   I spent a total of  25 Minutes in face to face in clinical consultation, greater than 50% of which was counseling/coordinating care for Hodgkin's lymphoma.

## 2022-10-23 ENCOUNTER — Ambulatory Visit (HOSPITAL_COMMUNITY)
Admission: RE | Admit: 2022-10-23 | Discharge: 2022-10-23 | Disposition: A | Discharge status: Home / Self Care | Payer: BC Managed Care – PPO | Source: Ambulatory Visit | Attending: Hematology and Oncology | Admitting: Hematology and Oncology

## 2022-10-23 ENCOUNTER — Telehealth: Payer: Self-pay | Admitting: Hematology and Oncology

## 2022-10-23 ENCOUNTER — Encounter: Payer: Self-pay | Admitting: Hematology and Oncology

## 2022-10-23 ENCOUNTER — Other Ambulatory Visit: Payer: Self-pay | Admitting: Hematology and Oncology

## 2022-10-23 DIAGNOSIS — C8190 Hodgkin lymphoma, unspecified, unspecified site: Secondary | ICD-10-CM | POA: Diagnosis not present

## 2022-10-23 DIAGNOSIS — R590 Localized enlarged lymph nodes: Secondary | ICD-10-CM | POA: Diagnosis not present

## 2022-10-23 DIAGNOSIS — C8118 Nodular sclerosis classical Hodgkin lymphoma, lymph nodes of multiple sites: Secondary | ICD-10-CM

## 2022-10-23 DIAGNOSIS — C8174 Other classical Hodgkin lymphoma, lymph nodes of axilla and upper limb: Secondary | ICD-10-CM | POA: Diagnosis not present

## 2022-10-23 LAB — GLUCOSE, CAPILLARY: Glucose-Capillary: 86 mg/dL (ref 70–99)

## 2022-10-23 MED ORDER — FLUDEOXYGLUCOSE F - 18 (FDG) INJECTION
11.3000 | Freq: Once | INTRAVENOUS | Status: AC | PRN
  Administered 2022-10-23: 11.1 via INTRAVENOUS

## 2022-10-23 MED ORDER — ONDANSETRON 8 MG PO TBDP: 8.0000 mg | ORAL_TABLET | Freq: Three times a day (TID) | ORAL | 0 refills | Status: DC | PRN

## 2022-10-23 NOTE — Telephone Encounter (Signed)
Called Mr. Lee [name] to discuss the results of his PET CTs and.  Given the results of the PET CT scan he is most likely a stage III Hodgkin's lymphoma.  Treatment of choice for this would be AAVD chemotherapy.  He currently has chemotherapy education set up for 10/26/2022.  We will plan for chemotherapy to start on 11/02/2022.  The patient voiced understanding of our findings and the plan moving forward.  All questions and concerns were addressed.  Ledell Peoples, MD Department of Hematology/Oncology [place] [facility] Phone: [phone] Pager: [phone] Email: [email or web address]

## 2022-10-24 ENCOUNTER — Telehealth: Payer: Self-pay

## 2022-10-24 ENCOUNTER — Encounter: Payer: Self-pay | Admitting: Hematology and Oncology

## 2022-10-24 ENCOUNTER — Other Ambulatory Visit: Payer: Self-pay

## 2022-10-24 NOTE — Progress Notes (Signed)
Pharmacist Chemotherapy Monitoring - Initial Assessment    Anticipated start date: 10/31/22   The following has been reviewed per standard work regarding the patient's treatment regimen: The patient's diagnosis, treatment plan and drug doses, and organ/hematologic function Lab orders and baseline tests specific to treatment regimen  The treatment plan start date, drug sequencing, and pre-medications Prior authorization status  Patient's documented medication list, including drug-drug interaction screen and prescriptions for anti-emetics and supportive care specific to the treatment regimen The drug concentrations, fluid compatibility, administration routes, and timing of the medications to be used The patient's access for treatment and lifetime cumulative dose history, if applicable  The patient's medication allergies and previous infusion related reactions, if applicable   Changes made to treatment plan:  N/A  Follow up needed:  Pending authorization for treatment .  Will need to f/u BCBS pref long-acting G-CSF; Onpro is currently ordered in tx plan.   Kennith Center, Pharm.D., CPP 10/24/2022@2 :44 PM

## 2022-10-24 NOTE — Telephone Encounter (Signed)
Contacted pt to let him know we would try to transition as many appointments as we could to Thursdays (his preferred day) but we wanted him to get started as soon as possible. Pt agreeable. Also instructed pt to bring FMLA papers in hen he comes in this week. Pt verbalized understanding.

## 2022-10-26 ENCOUNTER — Inpatient Hospital Stay: Payer: BC Managed Care – PPO | Attending: Hematology and Oncology

## 2022-10-26 DIAGNOSIS — Z5111 Encounter for antineoplastic chemotherapy: Secondary | ICD-10-CM | POA: Insufficient documentation

## 2022-10-26 DIAGNOSIS — N183 Chronic kidney disease, stage 3 unspecified: Secondary | ICD-10-CM | POA: Insufficient documentation

## 2022-10-26 DIAGNOSIS — Z21 Asymptomatic human immunodeficiency virus [HIV] infection status: Secondary | ICD-10-CM | POA: Insufficient documentation

## 2022-10-26 DIAGNOSIS — R682 Dry mouth, unspecified: Secondary | ICD-10-CM | POA: Insufficient documentation

## 2022-10-26 DIAGNOSIS — Z5112 Encounter for antineoplastic immunotherapy: Secondary | ICD-10-CM | POA: Insufficient documentation

## 2022-10-26 DIAGNOSIS — R161 Splenomegaly, not elsewhere classified: Secondary | ICD-10-CM | POA: Insufficient documentation

## 2022-10-26 DIAGNOSIS — C8174 Other classical Hodgkin lymphoma, lymph nodes of axilla and upper limb: Secondary | ICD-10-CM | POA: Insufficient documentation

## 2022-10-26 DIAGNOSIS — Z79899 Other long term (current) drug therapy: Secondary | ICD-10-CM | POA: Insufficient documentation

## 2022-10-26 DIAGNOSIS — Z9049 Acquired absence of other specified parts of digestive tract: Secondary | ICD-10-CM | POA: Insufficient documentation

## 2022-10-26 DIAGNOSIS — Z888 Allergy status to other drugs, medicaments and biological substances status: Secondary | ICD-10-CM | POA: Insufficient documentation

## 2022-10-26 DIAGNOSIS — R21 Rash and other nonspecific skin eruption: Secondary | ICD-10-CM | POA: Insufficient documentation

## 2022-10-26 DIAGNOSIS — Z5189 Encounter for other specified aftercare: Secondary | ICD-10-CM | POA: Insufficient documentation

## 2022-10-30 ENCOUNTER — Other Ambulatory Visit: Payer: Self-pay | Admitting: Infectious Diseases

## 2022-10-30 DIAGNOSIS — I10 Essential (primary) hypertension: Secondary | ICD-10-CM

## 2022-10-30 MED FILL — Dexamethasone Sodium Phosphate Inj 100 MG/10ML: INTRAMUSCULAR | Qty: 1 | Status: AC

## 2022-10-30 MED FILL — Fosaprepitant Dimeglumine For IV Infusion 150 MG (Base Eq): INTRAVENOUS | Qty: 5 | Status: AC

## 2022-10-30 NOTE — Telephone Encounter (Signed)
Next appt scheduled 6/5 with Dr Hatcher. 

## 2022-10-31 ENCOUNTER — Inpatient Hospital Stay (HOSPITAL_BASED_OUTPATIENT_CLINIC_OR_DEPARTMENT_OTHER): Payer: BC Managed Care – PPO | Admitting: Hematology and Oncology

## 2022-10-31 ENCOUNTER — Telehealth: Payer: Self-pay | Admitting: Hematology and Oncology

## 2022-10-31 ENCOUNTER — Inpatient Hospital Stay: Payer: BC Managed Care – PPO

## 2022-10-31 VITALS — BP 119/81 | HR 77 | Temp 99.0°F | Resp 16 | Ht 76.0 in | Wt 222.5 lb

## 2022-10-31 DIAGNOSIS — Z9049 Acquired absence of other specified parts of digestive tract: Secondary | ICD-10-CM | POA: Diagnosis not present

## 2022-10-31 DIAGNOSIS — R161 Splenomegaly, not elsewhere classified: Secondary | ICD-10-CM | POA: Diagnosis not present

## 2022-10-31 DIAGNOSIS — Z79899 Other long term (current) drug therapy: Secondary | ICD-10-CM | POA: Diagnosis not present

## 2022-10-31 DIAGNOSIS — R21 Rash and other nonspecific skin eruption: Secondary | ICD-10-CM | POA: Diagnosis not present

## 2022-10-31 DIAGNOSIS — Z95828 Presence of other vascular implants and grafts: Secondary | ICD-10-CM | POA: Diagnosis not present

## 2022-10-31 DIAGNOSIS — N183 Chronic kidney disease, stage 3 unspecified: Secondary | ICD-10-CM | POA: Diagnosis not present

## 2022-10-31 DIAGNOSIS — Z5189 Encounter for other specified aftercare: Secondary | ICD-10-CM | POA: Diagnosis not present

## 2022-10-31 DIAGNOSIS — C8118 Nodular sclerosis classical Hodgkin lymphoma, lymph nodes of multiple sites: Secondary | ICD-10-CM | POA: Diagnosis not present

## 2022-10-31 DIAGNOSIS — Z888 Allergy status to other drugs, medicaments and biological substances status: Secondary | ICD-10-CM | POA: Diagnosis not present

## 2022-10-31 DIAGNOSIS — C8174 Other classical Hodgkin lymphoma, lymph nodes of axilla and upper limb: Secondary | ICD-10-CM | POA: Diagnosis not present

## 2022-10-31 DIAGNOSIS — Z21 Asymptomatic human immunodeficiency virus [HIV] infection status: Secondary | ICD-10-CM | POA: Diagnosis not present

## 2022-10-31 DIAGNOSIS — R682 Dry mouth, unspecified: Secondary | ICD-10-CM | POA: Diagnosis not present

## 2022-10-31 DIAGNOSIS — Z5111 Encounter for antineoplastic chemotherapy: Secondary | ICD-10-CM | POA: Diagnosis not present

## 2022-10-31 DIAGNOSIS — Z5112 Encounter for antineoplastic immunotherapy: Secondary | ICD-10-CM | POA: Diagnosis not present

## 2022-10-31 LAB — CMP (CANCER CENTER ONLY)
ALT: 9 U/L (ref 0–44)
AST: 20 U/L (ref 15–41)
Albumin: 2.5 g/dL — ABNORMAL LOW (ref 3.5–5.0)
Alkaline Phosphatase: 83 U/L (ref 38–126)
Anion gap: 4 — ABNORMAL LOW (ref 5–15)
BUN: 16 mg/dL (ref 6–20)
CO2: 25 mmol/L (ref 22–32)
Calcium: 9.1 mg/dL (ref 8.9–10.3)
Chloride: 103 mmol/L (ref 98–111)
Creatinine: 1.57 mg/dL — ABNORMAL HIGH (ref 0.61–1.24)
GFR, Estimated: 57 mL/min — ABNORMAL LOW (ref >60)
Glucose, Bld: 89 mg/dL (ref 70–99)
Potassium: 3.7 mmol/L (ref 3.5–5.1)
Sodium: 132 mmol/L — ABNORMAL LOW (ref 135–145)
Total Bilirubin: 0.3 mg/dL (ref 0.3–1.2)
Total Protein: 11.8 g/dL — ABNORMAL HIGH (ref 6.5–8.1)

## 2022-10-31 LAB — CBC WITH DIFFERENTIAL (CANCER CENTER ONLY)
Abs Immature Granulocytes: 0.02 10*3/uL (ref 0.00–0.07)
Basophils Absolute: 0 10*3/uL (ref 0.0–0.1)
Basophils Relative: 1 %
Eosinophils Absolute: 0 10*3/uL (ref 0.0–0.5)
Eosinophils Relative: 0 %
HCT: 33.4 % — ABNORMAL LOW (ref 39.0–52.0)
Hemoglobin: 11.6 g/dL — ABNORMAL LOW (ref 13.0–17.0)
Immature Granulocytes: 0 %
Lymphocytes Relative: 30 %
Lymphs Abs: 1.5 10*3/uL (ref 0.7–4.0)
MCH: 29 pg (ref 26.0–34.0)
MCHC: 34.7 g/dL (ref 30.0–36.0)
MCV: 83.5 fL (ref 80.0–100.0)
Monocytes Absolute: 0.4 10*3/uL (ref 0.1–1.0)
Monocytes Relative: 7 %
Neutro Abs: 3.1 10*3/uL (ref 1.7–7.7)
Neutrophils Relative %: 62 %
Platelet Count: 232 10*3/uL (ref 150–400)
RBC: 4 MIL/uL — ABNORMAL LOW (ref 4.22–5.81)
RDW: 14.2 % (ref 11.5–15.5)
WBC Count: 5 10*3/uL (ref 4.0–10.5)
nRBC: 0 % (ref 0.0–0.2)

## 2022-10-31 MED ORDER — DOXORUBICIN HCL CHEMO IV INJECTION 2 MG/ML
25.0000 mg/m2 | Freq: Once | INTRAVENOUS | Status: AC
  Administered 2022-10-31: 58 mg via INTRAVENOUS
  Filled 2022-10-31: qty 29

## 2022-10-31 MED ORDER — SODIUM CHLORIDE 0.9 % IV SOLN
150.0000 mg | Freq: Once | INTRAVENOUS | Status: AC
  Administered 2022-10-31: 150 mg via INTRAVENOUS
  Filled 2022-10-31: qty 150

## 2022-10-31 MED ORDER — SODIUM CHLORIDE 0.9 % IV SOLN
120.0000 mg | Freq: Once | INTRAVENOUS | Status: AC
  Administered 2022-10-31: 120 mg via INTRAVENOUS
  Filled 2022-10-31: qty 24

## 2022-10-31 MED ORDER — SODIUM CHLORIDE 0.9% FLUSH
10.0000 mL | INTRAVENOUS | Status: DC | PRN
  Administered 2022-10-31: 10 mL

## 2022-10-31 MED ORDER — VINBLASTINE SULFATE CHEMO INJECTION 1 MG/ML
6.0000 mg/m2 | Freq: Once | INTRAVENOUS | Status: AC
  Administered 2022-10-31: 14 mg via INTRAVENOUS
  Filled 2022-10-31: qty 14

## 2022-10-31 MED ORDER — ACETAMINOPHEN 325 MG PO TABS
650.0000 mg | ORAL_TABLET | Freq: Once | ORAL | Status: AC
  Administered 2022-10-31: 650 mg via ORAL
  Filled 2022-10-31: qty 2

## 2022-10-31 MED ORDER — PALONOSETRON HCL INJECTION 0.25 MG/5ML
0.2500 mg | Freq: Once | INTRAVENOUS | Status: AC
  Administered 2022-10-31: 0.25 mg via INTRAVENOUS
  Filled 2022-10-31: qty 5

## 2022-10-31 MED ORDER — DIPHENHYDRAMINE HCL 50 MG/ML IJ SOLN
50.0000 mg | Freq: Once | INTRAMUSCULAR | Status: AC
  Administered 2022-10-31: 50 mg via INTRAVENOUS
  Filled 2022-10-31: qty 1

## 2022-10-31 MED ORDER — SODIUM CHLORIDE 0.9 % IV SOLN
10.0000 mg | Freq: Once | INTRAVENOUS | Status: AC
  Administered 2022-10-31: 10 mg via INTRAVENOUS
  Filled 2022-10-31: qty 1

## 2022-10-31 MED ORDER — HEPARIN SOD (PORK) LOCK FLUSH 100 UNIT/ML IV SOLN
500.0000 [IU] | Freq: Once | INTRAVENOUS | Status: AC | PRN
  Administered 2022-10-31: 500 [IU]

## 2022-10-31 MED ORDER — SODIUM CHLORIDE 0.9 % IV SOLN
375.0000 mg/m2 | Freq: Once | INTRAVENOUS | Status: AC
  Administered 2022-10-31: 800 mg via INTRAVENOUS
  Filled 2022-10-31: qty 80

## 2022-10-31 MED ORDER — SODIUM CHLORIDE 0.9 % IV SOLN
10.0000 mg | Freq: Once | INTRAVENOUS | Status: DC
  Filled 2022-10-31: qty 1

## 2022-10-31 MED ORDER — SODIUM CHLORIDE 0.9 % IV SOLN: Freq: Once | INTRAVENOUS | Status: AC

## 2022-10-31 NOTE — Patient Instructions (Signed)
[facility]  Discharge Instructions: Thank you for choosing [facility] to provide your oncology and hematology care.   If you have a lab appointment with the Cancer Center, please go directly to the Cancer Center and check in at the registration area.   Wear comfortable clothing and clothing appropriate for easy access to any Portacath or PICC line.   We strive to give you quality time with your provider. You may need to reschedule your appointment if you arrive late (15 or more minutes).  Arriving late affects you and other patients whose appointments are after yours.  Also, if you miss three or more appointments without notifying the office, you may be dismissed from the clinic at the provider's discretion.      For prescription refill requests, have your pharmacy contact our office and allow 72 hours for refills to be completed.    Today you received the following chemotherapy and/or immunotherapy agents: doxorubicin, vinblastine, dacarbazine, brentuximab vedotin      To help prevent nausea and vomiting after your treatment, we encourage you to take your nausea medication as directed.  BELOW ARE SYMPTOMS THAT SHOULD BE REPORTED IMMEDIATELY: *FEVER GREATER THAN 100.4 F (38 C) OR HIGHER *CHILLS OR SWEATING *NAUSEA AND VOMITING THAT IS NOT CONTROLLED WITH YOUR NAUSEA MEDICATION *UNUSUAL SHORTNESS OF BREATH *UNUSUAL BRUISING OR BLEEDING *URINARY PROBLEMS (pain or burning when urinating, or frequent urination) *BOWEL PROBLEMS (unusual diarrhea, constipation, pain near the anus) TENDERNESS IN MOUTH AND THROAT WITH OR WITHOUT PRESENCE OF ULCERS (sore throat, sores in mouth, or a toothache) UNUSUAL RASH, SWELLING OR PAIN  UNUSUAL VAGINAL DISCHARGE OR ITCHING   Items with * indicate a potential emergency and should be followed up as soon as possible or go to the Emergency Department if any problems should occur.  Please show the  CHEMOTHERAPY ALERT CARD or IMMUNOTHERAPY ALERT CARD at check-in to the Emergency Department and triage nurse.  Should you have questions after your visit or need to cancel or reschedule your appointment, please contact [facility]  Dept: [phone]  and follow the prompts.  Office hours are 8:00 a.m. to 4:30 p.m. Monday - Friday. Please note that voicemails left after 4:00 p.m. may not be returned until the following business day.  We are closed weekends and major holidays. You have access to a nurse at all times for urgent questions. Please call the main number to the clinic Dept: [phone] and follow the prompts.   For any non-urgent questions, you may also contact your provider using MyChart. We now offer e-Visits for anyone 4 and older to request care online for non-urgent symptoms. For details visit [email or web address].   Also download the MyChart app! Go to the app store, search "MyChart", open the app, select , and log in with your MyChart username and password.

## 2022-10-31 NOTE — Progress Notes (Signed)
Per Dr Leonides Schanz, ok to treat with Scr 1.57

## 2022-10-31 NOTE — Telephone Encounter (Signed)
Reached out to patient to schedule; left voicemail. 

## 2022-10-31 NOTE — Progress Notes (Signed)
[facility] [phone]   [phone]  PROGRESS NOTE  Patient Care Team: Shirline Frees, NP as PCP - General (Family Medicine) Sidney Ace, MD as Referring Physician (Allergy) Jaci Standard, MD as Consulting Physician (Hematology and Oncology)  Hematological/Oncological History # Hodgkin's Lymphoma, Stage III 06/08/2022: CT neck showed Bulky adenopathy in the deep right pectoral region, recommend chest CT with contrast. There was splenomegaly on abdominal CT from the summer, consider addition of abdominal CT is well. 07/01/2023: CT chest showed Bulky RIGHT axillary lymph node enlargement, with nodal mass measuring 7 cm in length. Findings suspicious for lymphoproliferative disease 09/13/2022: US guided biopsy of right axillary lymphadenopathy was consistent with classical Hodgkin's lymphoma  10/06/2022: establish care with Dr. Leonides Schanz  10/31/2022: Cycle 1 Day 1 of A-AVD chemotherapy.     Interval History:  Lee [name] 42 y.o. male with medical history significant for stage III nodular sclerosing Hodgkin's lymphoma who presents for a follow up visit. The patient's last visit was on 10/06/2022 at which time he established care. In the interim since the last visit he had his port placed, echo completed, PET CT scan, and chemo education.  On exam today Lee [name] is seen in infusion.  He reports that the swelling in his face has improved somewhat though the lymph node on the left side of his face has increased in size.  He notes that his vision has also improved though he is having issues with dry mouth.  He also reports that the lymph node under his right axilla is more prominent.  He notes his weight has dropped some about 3 pounds in the interim since her last visit.  He notes that he is trying to do his best to increase his p.o. intake including eating more red meat.  He notes that his port is settling in well and not causing him any difficulty.  He  currently denies any fevers, chills, sweats, nausea, vomiting or diarrhea.  He is willing and able to proceed with treatment today.  MEDICAL HISTORY:  Past Medical History:  Diagnosis Date   CKD (chronic kidney disease) stage 3, GFR 30-59 ml/min (HCC) 02/20/2019   Eczema    Environmental allergies    Essential hypertension    HIV test positive (HCC)    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HCC) 07/24/2016   Shingles     SURGICAL HISTORY: Past Surgical History:  Procedure Laterality Date   CHOLECYSTECTOMY N/A 12/30/2021   Procedure: LAPAROSCOPIC CHOLECYSTECTOMY WITH  CHOLANGIOGRAM;  Surgeon: Darnell Level, MD;  Location: [facility];  Service: General;  Laterality: N/A;   IR IMAGING GUIDED PORT INSERTION  10/18/2022   LAPAROSCOPIC APPENDECTOMY N/A 03/12/2020   Procedure: APPENDECTOMY LAPAROSCOPIC;  Surgeon: Romie Levee, MD;  Location: [facility];  Service: General;  Laterality: N/A;   TYMPANOSTOMY TUBE PLACEMENT     WISDOM TOOTH EXTRACTION     WRIST SURGERY      SOCIAL HISTORY: Social History   Socioeconomic History   Marital status: Single    Spouse name: Not on file   Number of children: Not on file   Years of education: Not on file   Highest education level: Associate degree: academic program  Occupational History   Not on file  Tobacco Use   Smoking status: Never   Smokeless tobacco: Never  Vaping Use   Vaping Use: Never used  Substance and Sexual Activity   Alcohol use: No   Drug use: No   Sexual activity:  Not Currently    Partners: Male    Birth control/protection: Condom    Comment: Given Condoms  Other Topics Concern   Not on file  Social History Narrative   Works as AcupuncturistBCBS    -Not married   Social Determinants of Health   Financial Resource Strain: Low Risk  (02/09/2022)   Overall Financial Resource Strain (CARDIA)    Difficulty of Paying Living Expenses: Not hard at all  Food Insecurity: No Food Insecurity (06/27/2022)   Hunger Vital Sign    Worried About  Running Out of Food in the Last Year: Never true    Ran Out of Food in the Last Year: Never true  Transportation Needs: No Transportation Needs (06/27/2022)   PRAPARE - Administrator, Civil ServiceTransportation    Lack of Transportation (Medical): No    Lack of Transportation (Non-Medical): No  Physical Activity: Unknown (02/09/2022)   Exercise Vital Sign    Days of Exercise per Week: 0 days    Minutes of Exercise per Session: Not on file  Stress: Stress Concern Present (02/09/2022)   [employer] - Occupational Stress Questionnaire    Feeling of Stress : To some extent  Social Connections: Moderately Integrated (02/09/2022)   Social Connection and Isolation Panel [NHANES]    Frequency of Communication with Friends and Family: More than three times a week    Frequency of Social Gatherings with Friends and Family: Not on file    Attends Religious Services: More than 4 times per year    Active Member of [employer] Financial[employer]ubs or Organizations: Yes    Attends Engineer, structuralClub or Organization Meetings: More than 4 times per year    Marital Status: Never married  Catering managerntimate Partner Violence: Not on file    FAMILY HISTORY: Family History  Problem Relation Age of Onset   Hypercalcemia Mother    Hypertension Mother    Hypertension Father     ALLERGIES:  is allergic to gabapentin, lisinopril, and nickel.  MEDICATIONS:  Current Outpatient Medications  Medication Sig Dispense Refill   albuterol (PROVENTIL HFA;VENTOLIN HFA) 108 (90 Base) MCG/ACT inhaler Inhale 2 puffs into the lungs every 6 (six) hours as needed for wheezing or shortness of breath. 1 Inhaler 1   amLODipine (NORVASC) 10 MG tablet Take 1 tablet by mouth once daily 30 tablet 0   azelastine (ASTELIN) 0.1 % nasal spray 1-2 puffs in each nostril twice daily     azelastine (OPTIVAR) 0.05 % ophthalmic solution Apply to eye.     [name] 50-200-25 MG TABS tablet TAKE 1 TABLET DAILY 30 tablet 5   BREO ELLIPTA 200-25 MCG/ACT AEPB Inhale 1 puff into the lungs  daily.     EPINEPHrine 0.3 mg/0.3 mL IJ SOAJ injection Inject 0.3 mg into the muscle once.     lidocaine-prilocaine (EMLA) cream Apply 1 Application topically as needed. 30 g 0   montelukast (SINGULAIR) 10 MG tablet Take 10 mg by mouth at bedtime.     ondansetron (ZOFRAN) 8 MG tablet Take 1 tablet (8 mg total) by mouth every 8 (eight) hours as needed. 30 tablet 0   ondansetron (ZOFRAN-ODT) 8 MG disintegrating tablet Take 1 tablet (8 mg total) by mouth every 8 (eight) hours as needed for nausea or vomiting. 30 tablet 0   prochlorperazine (COMPAZINE) 10 MG tablet Take 1 tablet (10 mg total) by mouth every 6 (six) hours as needed for nausea or vomiting. 30 tablet 0   topiramate (TOPAMAX) 50 MG tablet Take 0.5 tablets (25 mg total)  by mouth daily. 30 tablet 0   Vitamin D, Ergocalciferol, (DRISDOL) 1.25 MG (50000 UT) CAPS capsule Take 50,000 Units by mouth once a week. Friday     No current facility-administered medications for this visit.    REVIEW OF SYSTEMS:   Constitutional: ( - ) fevers, ( - )  chills , ( - ) night sweats Eyes: ( - ) blurriness of vision, ( - ) double vision, ( - ) watery eyes Ears, nose, mouth, throat, and face: ( - ) mucositis, ( - ) sore throat Respiratory: ( - ) cough, ( - ) dyspnea, ( - ) wheezes Cardiovascular: ( - ) palpitation, ( - ) chest discomfort, ( - ) lower extremity swelling Gastrointestinal:  ( - ) nausea, ( - ) heartburn, ( - ) change in bowel habits Skin: ( - ) abnormal skin rashes Lymphatics: ( - ) new lymphadenopathy, ( - ) easy bruising Neurological: ( - ) numbness, ( - ) tingling, ( - ) new weaknesses Behavioral/Psych: ( - ) mood change, ( - ) new changes  All other systems were reviewed with the patient and are negative.  PHYSICAL EXAMINATION: ECOG PERFORMANCE STATUS: 1 - Symptomatic but completely ambulatory  There were no vitals filed for this visit. There were no vitals filed for this visit.  GENERAL: Well-appearing young African-American  male, alert, no distress and comfortable SKIN: skin color, texture, turgor are normal, no rashes or significant lesions EYES: conjunctiva are pink and non-injected, sclera clear LUNGS: clear to auscultation and percussion with normal breathing effort HEART: regular rate & rhythm and no murmurs and no lower extremity edema Musculoskeletal: no cyanosis of digits and no clubbing  PSYCH: alert & oriented x 3, fluent speech NEURO: no focal motor/sensory deficits  LABORATORY DATA:  I have reviewed the data as listed    Latest Ref Rng & Units 10/31/2022    7:59 AM 10/06/2022    2:59 PM 09/13/2022   12:43 PM  CBC  WBC 4.0 - 10.5 K/uL 5.0  5.1  4.2   Hemoglobin 13.0 - 17.0 g/dL 16.1  09.6  04.5   Hematocrit 39.0 - 52.0 % 33.4  34.8  36.5   Platelets 150 - 400 K/uL 232  211  221        Latest Ref Rng & Units 10/31/2022    7:59 AM 10/06/2022    2:59 PM 09/13/2022   12:43 PM  CMP  Glucose 70 - 99 mg/dL 89  76  82   BUN 6 - 20 mg/dL 16  17  19    Creatinine 0.61 - 1.24 mg/dL 4.09  8.11  9.14   Sodium 135 - 145 mmol/L 132  130  134   Potassium 3.5 - 5.1 mmol/L 3.7  3.9  3.8   Chloride 98 - 111 mmol/L 103  102  106   CO2 22 - 32 mmol/L 25  24  24    Calcium 8.9 - 10.3 mg/dL 9.1  8.9  8.7   Total Protein 6.5 - 8.1 g/dL 78.2  >95.6    Total Bilirubin 0.3 - 1.2 mg/dL 0.3  0.3    Alkaline Phos 38 - 126 U/L 83  82    AST 15 - 41 U/L 20  23    ALT 0 - 44 U/L 9  12       RADIOGRAPHIC STUDIES: I have personally reviewed the radiological images as listed and agreed with the findings in the report: FDG avid lymph nodes on both  sides of the diaphragm, most prominently in the cervical regions and axilla.  Consistent with at least stage III disease. NM PET Image Initial (PI) Skull Base To Thigh  Result Date: 10/23/2022 CLINICAL DATA:  Initial treatment strategy for Hodgkin lymphoma. EXAM: NUCLEAR MEDICINE PET SKULL BASE TO THIGH TECHNIQUE: 11.1 mCi F-18 FDG was injected intravenously. Full-ring PET imaging  was performed from the skull base to thigh after the radiotracer. CT data was obtained and used for attenuation correction and anatomic localization. Fasting blood glucose: 86 mg/dl COMPARISON:  CT chest 16/04/9603 and CT abdomen pelvis 01/23/2022. FINDINGS: Mediastinal blood pool activity: SUV max 2.1 Liver activity: SUV max 2.8 NECK: Symmetric hypermetabolism throughout the parotid glands, submandibular glands and nasopharynx. No definite hypermetabolic lymph nodes. Incidental CT findings: Fluid in the maxillary sinuses. CHEST: Extensive right subpectoral and axillary hypermetabolic adenopathy. Index right axillary lymph node measures 3.3 cm (4/58), SUV max 14.2. Hypermetabolic mediastinal and hilar lymph nodes. Index left hilar hypermetabolism, SUV max 5.7. Left axillary lymph nodes do not show metabolism above blood pool. Incidental CT findings: Right IJ Port-A-Cath terminates in the right atrium. Heart is enlarged. No pericardial or pleural effusion. Evaluation of the lungs is limited due to respiratory motion and thick slice collimation. Slight nodularity along the fissures appears similar to 06/30/2022. ABDOMEN/PELVIS: Splenic metabolism, SUV max 3.5, is minimally above liver, 2.8. Hypermetabolic abdominal and retroperitoneal lymph nodes. Index left periaortic lymph node measures 11 mm (4/124), SUV max 4.9. Index right external iliac lymph node measures 12 mm (4/185), SUV max 3.7. Incidental CT findings: Liver is likely enlarged. Liver and spleen are enlarged. Accurate measurement is challenging without coronal imaging and with respiratory motion. Cholecystectomy. Adrenal glands, kidneys, pancreas, stomach and bowel are grossly unremarkable. SKELETON: No abnormal hypermetabolism. Incidental CT findings: None. IMPRESSION: 1. Hypermetabolic adenopathy in the chest, abdomen and pelvis, compatible with Hodgkin lymphoma ([place] 4-5). 2. Hepatosplenomegaly. Electronically Signed   By: Leanna Battles M.D.   On:  10/23/2022 12:57   IR IMAGING GUIDED PORT INSERTION  Result Date: 10/18/2022 INDICATION: History of lymphoma. In need of durable intravenous access for chemotherapy administration. EXAM: IMPLANTED PORT A CATH PLACEMENT WITH ULTRASOUND AND FLUOROSCOPIC GUIDANCE COMPARISON:  Chest CT-06/30/2022 MEDICATIONS: None ANESTHESIA/SEDATION: Moderate (conscious) sedation was employed during this procedure as administered by the Interventional Radiology RN. A total of Versed 2 mg and Fentanyl 100 mcg was administered intravenously. Moderate Sedation Time: 27 minutes. The patient's level of consciousness and vital signs were monitored continuously by radiology nursing throughout the procedure under my direct supervision. CONTRAST:  None FLUOROSCOPY TIME:  40 seconds (3 mGy) COMPLICATIONS: None immediate. PROCEDURE: The procedure, risks, benefits, and alternatives were explained to the patient. Questions regarding the procedure were encouraged and answered. The patient understands and consents to the procedure. The right neck and chest were prepped with chlorhexidine in a sterile fashion, and a sterile drape was applied covering the operative field. Maximum barrier sterile technique with sterile gowns and gloves were used for the procedure. A timeout was performed prior to the initiation of the procedure. Local anesthesia was provided with 1% lidocaine with epinephrine. After creating a small venotomy incision, a micropuncture kit was utilized to access the internal jugular vein. Real-time ultrasound guidance was utilized for vascular access including the acquisition of a permanent ultrasound image documenting patency of the accessed vessel. The microwire was utilized to measure appropriate catheter length. A subcutaneous port pocket was then created along the upper chest wall utilizing a combination of sharp and  blunt dissection. The pocket was irrigated with sterile saline. A single lumen clear view power injectable port  was chosen for placement. (Clear view Port a catheter was selected due to patient's history of a nickel allergy). The 8 Fr catheter was tunneled from the port pocket site to the venotomy incision. The port was placed in the pocket. The external catheter was trimmed to appropriate length. At the venotomy, an 8 Fr peel-away sheath was placed over a guidewire under fluoroscopic guidance. The catheter was then placed through the sheath and the sheath was removed. Final catheter positioning was confirmed and documented with a fluoroscopic spot radiograph. The port was accessed with a Huber needle, aspirated and flushed with heparinized saline. The venotomy site was closed with an interrupted 4-0 Vicryl suture. The port pocket incision was closed with interrupted 2-0 Vicryl suture. The skin was opposed with a running subcuticular 4-0 Vicryl suture. Dermabond and Steri-strips were applied to both incisions. Dressings were applied. The patient tolerated the procedure well without immediate post procedural complication. FINDINGS: After catheter placement, the tip lies within the superior cavoatrial junction. The catheter aspirates and flushes normally and is ready for immediate use. IMPRESSION: Successful placement of a right internal jugular approach power injectable Port-A-Cath. The catheter is ready for immediate use. Electronically Signed   By: Simonne Come M.D.   On: 10/18/2022 16:36   ECHOCARDIOGRAM COMPLETE  Result Date: 10/16/2022    ECHOCARDIOGRAM REPORT   Patient Name:   ROBERTO ROMANOSKI [facility] Date of Exam: 10/16/2022 Medical Rec #:  [id number]          Height:       76.0 in Accession #:    [id number]         Weight:       225.1 lb Date of Birth:  1980-08-31          BSA:          2.329 m Patient Age:    41 years           BP:           155/79 mmHg Patient Gender: M                  HR:           66 bpm. Exam Location:  Outpatient Procedure: 2D Echo, 3D Echo, Cardiac Doppler, Color Doppler and Strain Analysis  Indications:    Chemo  History:        Patient has prior history of Echocardiogram examinations, most                 recent 09/10/2017. Risk Factors:Hypertension. HIV positive, CKD.  Sonographer:    Milda Smart Referring Phys: [id number] Jackquline Denmark Keefe Zawistowski IV  Sonographer Comments: Image acquisition challenging due to respiratory motion and Image acquisition challenging due to patient body habitus. Global longitudinal strain was attempted. IMPRESSIONS  1. Left ventricular ejection fraction, by estimation, is 55 to 60%. The left ventricle has normal function. The left ventricle has no regional wall motion abnormalities. There is mild concentric left ventricular hypertrophy. Left ventricular diastolic parameters were normal. The average left ventricular global longitudinal strain is -17.8 %. The global longitudinal strain is normal.  2. Right ventricular systolic function is normal. The right ventricular size is normal. Tricuspid regurgitation signal is inadequate for assessing PA pressure.  3. The mitral valve is normal in structure. No evidence of mitral valve regurgitation. No evidence of mitral stenosis.  4. The aortic valve  is tricuspid. Aortic valve regurgitation is not visualized. No aortic stenosis is present.  5. Aortic dilatation noted. There is borderline dilatation of the aortic root, measuring 38 mm.  6. The inferior vena cava is dilated in size with >50% respiratory variability, suggesting right atrial pressure of 8 mmHg. Comparison(s): No prior Echocardiogram. FINDINGS  Left Ventricle: Left ventricular ejection fraction, by estimation, is 55 to 60%. The left ventricle has normal function. The left ventricle has no regional wall motion abnormalities. The average left ventricular global longitudinal strain is -17.8 %. The global longitudinal strain is normal. The left ventricular internal cavity size was normal in size. There is mild concentric left ventricular hypertrophy. Left ventricular diastolic  parameters were normal. Right Ventricle: The right ventricular size is normal. Right ventricular systolic function is normal. Tricuspid regurgitation signal is inadequate for assessing PA pressure. Left Atrium: Left atrial size was normal in size. Right Atrium: Right atrial size was normal in size. Pericardium: Trivial pericardial effusion is present. Mitral Valve: The mitral valve is normal in structure. No evidence of mitral valve regurgitation. No evidence of mitral valve stenosis. Tricuspid Valve: The tricuspid valve is normal in structure. Tricuspid valve regurgitation is trivial. No evidence of tricuspid stenosis. Aortic Valve: The aortic valve is tricuspid. Aortic valve regurgitation is not visualized. No aortic stenosis is present. Pulmonic Valve: The pulmonic valve was normal in structure. Pulmonic valve regurgitation is trivial. No evidence of pulmonic stenosis. Aorta: Aortic dilatation noted. There is borderline dilatation of the aortic root, measuring 38 mm. Venous: The inferior vena cava is dilated in size with greater than 50% respiratory variability, suggesting right atrial pressure of 8 mmHg. IAS/Shunts: No atrial level shunt detected by color flow Doppler.  LEFT VENTRICLE PLAX 2D LVIDd:         5.00 cm   Diastology LVIDs:         3.40 cm   LV e' medial:    6.74 cm/s LV PW:         1.60 cm   LV E/e' medial:  10.9 LV IVS:        1.00 cm   LV e' lateral:   11.50 cm/s LVOT diam:     2.40 cm   LV E/e' lateral: 6.4 LV SV:         103 LV SV Index:   44        2D Longitudinal Strain LVOT Area:     4.52 cm  2D Strain GLS Avg:     -17.8 %                           3D Volume EF:                          3D EF:        58 %                          LV EDV:       157 ml                          LV ESV:       66 ml                          LV SV:  91 ml RIGHT VENTRICLE             IVC RV S prime:     13.60 cm/s  IVC diam: 2.50 cm TAPSE (M-mode): 2.6 cm LEFT ATRIUM             Index        RIGHT ATRIUM            Index LA diam:        3.00 cm 1.29 cm/m   RA Area:     19.20 cm LA Vol (A2C):   77.0 ml 33.06 ml/m  RA Volume:   60.90 ml  26.14 ml/m LA Vol (A4C):   37.6 ml 16.14 ml/m LA Biplane Vol: 55.9 ml 24.00 ml/m  AORTIC VALVE LVOT Vmax:   121.00 cm/s LVOT Vmean:  85.900 cm/s LVOT VTI:    0.228 m  AORTA Ao Root diam: 3.80 cm Ao Asc diam:  3.40 cm MITRAL VALVE MV Area (PHT): 2.93 cm    SHUNTS MV Decel Time: 259 msec    Systemic VTI:  0.23 m MV E velocity: 73.70 cm/s  Systemic Diam: 2.40 cm Olga Millers MD Electronically signed by Olga Millers MD Signature Date/Time: 10/16/2022/8:54:36 AM    Final     ASSESSMENT & PLAN Lee [name] 42 y.o. male with medical history significant for stage III nodular sclerosing Hodgkin's lymphoma who presents for a follow up visit.   After review of the labs, review of the records, and discussion with the patient the patients findings are most consistent with classical Hodgkin's Lymphoma.    # Hodgkin's Lymphoma, Stage III -- PET CT scan staging complete, findings consistent with stage III Hodgkin's lymphoma. --Will plan to proceed with A-AVD chemotherapy. --Echocardiogram complete, shows excellent baseline cardiac function. Plan:  --Labs today show creatinine 1.57, albumin 2.5, white blood cell 5.0, hemoglobin 10.6, MCV 83.5, and platelets of 232 --OK to proceed with Cycle 1 Day 1 of A-AVD chemotherapy today --RTC in 2 weeks for Cycle 1 Day 15 of treatment    #Supportive Care -- chemotherapy education complete -- port placed -- zofran 8mg  q8H PRN and compazine 10mg  PO q6H for nausea -- EMLA cream for port -- no pain medication required at this time.   Orders Placed This Encounter  Procedures   CBC with Differential (Cancer Center Only)    Standing Status:   Future    Standing Expiration Date:   11/28/2023   CMP (Cancer Center only)    Standing Status:   Future    Standing Expiration Date:   11/28/2023   CBC with Differential (Cancer Center Only)     Standing Status:   Future    Standing Expiration Date:   12/12/2023   CMP (Cancer Center only)    Standing Status:   Future    Standing Expiration Date:   12/12/2023   CBC with Differential (Cancer Center Only)    Standing Status:   Future    Standing Expiration Date:   12/26/2023   CMP (Cancer Center only)    Standing Status:   Future    Standing Expiration Date:   12/26/2023   CBC with Differential (Cancer Center Only)    Standing Status:   Future    Standing Expiration Date:   01/09/2024   CMP (Cancer Center only)    Standing Status:   Future    Standing Expiration Date:   01/09/2024   CBC with Differential (Cancer Center Only)    Standing  Status:   Future    Standing Expiration Date:   01/23/2024   CMP (Cancer Center only)    Standing Status:   Future    Standing Expiration Date:   01/23/2024   CBC with Differential (Cancer Center Only)    Standing Status:   Future    Standing Expiration Date:   02/06/2024   CMP (Cancer Center only)    Standing Status:   Future    Standing Expiration Date:   02/06/2024    All questions were answered. The patient knows to call the clinic with any problems, questions or concerns.  A total of more than 30 minutes were spent on this encounter with face-to-face time and non-face-to-face time, including preparing to see the patient, ordering tests and/or medications, counseling the patient and coordination of care as outlined above.   Ulysees Barns, MD Department of Hematology/Oncology [facility] Phone: [phone] Pager: [phone] Email: [email or web address]  10/31/2022 2:12 PM

## 2022-11-01 ENCOUNTER — Other Ambulatory Visit: Payer: Self-pay

## 2022-11-01 ENCOUNTER — Encounter: Payer: Self-pay | Admitting: Hematology and Oncology

## 2022-11-01 ENCOUNTER — Telehealth: Payer: Self-pay | Admitting: Hematology and Oncology

## 2022-11-01 NOTE — Telephone Encounter (Signed)
Reached out ot patient to make him aware of added appointments; left vociemail will call again at a later time.

## 2022-11-01 NOTE — Telephone Encounter (Signed)
-----   Message from Consepcion Hearing, RN sent at 10/31/2022  1:51 PM EDT ----- Regarding: 1st Time A-AVD Dr Leonides Schanz 1st Time A-AVD, Dr Dorsey's patient. No issues during treatment.

## 2022-11-01 NOTE — Telephone Encounter (Signed)
Chemo Call Back.  Pt reports doing "surprisingly well".  He reports some diarrhea yest but seems to have cleared & a little nausea once & took compazine which helped.  He knows his next appts & decided that Wednesdays will  work ok for him now.  He reports knowing how to reach [place] if needed.

## 2022-11-02 ENCOUNTER — Encounter: Payer: Self-pay | Admitting: Hematology and Oncology

## 2022-11-02 ENCOUNTER — Inpatient Hospital Stay: Payer: BC Managed Care – PPO

## 2022-11-02 VITALS — BP 120/81 | HR 64 | Temp 97.9°F | Resp 16

## 2022-11-02 DIAGNOSIS — R161 Splenomegaly, not elsewhere classified: Secondary | ICD-10-CM | POA: Diagnosis not present

## 2022-11-02 DIAGNOSIS — Z888 Allergy status to other drugs, medicaments and biological substances status: Secondary | ICD-10-CM | POA: Diagnosis not present

## 2022-11-02 DIAGNOSIS — R21 Rash and other nonspecific skin eruption: Secondary | ICD-10-CM | POA: Diagnosis not present

## 2022-11-02 DIAGNOSIS — C8174 Other classical Hodgkin lymphoma, lymph nodes of axilla and upper limb: Secondary | ICD-10-CM | POA: Diagnosis not present

## 2022-11-02 DIAGNOSIS — C8118 Nodular sclerosis classical Hodgkin lymphoma, lymph nodes of multiple sites: Secondary | ICD-10-CM

## 2022-11-02 DIAGNOSIS — Z21 Asymptomatic human immunodeficiency virus [HIV] infection status: Secondary | ICD-10-CM | POA: Diagnosis not present

## 2022-11-02 DIAGNOSIS — Z5189 Encounter for other specified aftercare: Secondary | ICD-10-CM | POA: Diagnosis not present

## 2022-11-02 DIAGNOSIS — Z79899 Other long term (current) drug therapy: Secondary | ICD-10-CM | POA: Diagnosis not present

## 2022-11-02 DIAGNOSIS — Z5111 Encounter for antineoplastic chemotherapy: Secondary | ICD-10-CM | POA: Diagnosis not present

## 2022-11-02 DIAGNOSIS — N183 Chronic kidney disease, stage 3 unspecified: Secondary | ICD-10-CM | POA: Diagnosis not present

## 2022-11-02 DIAGNOSIS — Z9049 Acquired absence of other specified parts of digestive tract: Secondary | ICD-10-CM | POA: Diagnosis not present

## 2022-11-02 DIAGNOSIS — Z5112 Encounter for antineoplastic immunotherapy: Secondary | ICD-10-CM | POA: Diagnosis not present

## 2022-11-02 DIAGNOSIS — R682 Dry mouth, unspecified: Secondary | ICD-10-CM | POA: Diagnosis not present

## 2022-11-02 MED ORDER — PEGFILGRASTIM-CBQV 6 MG/0.6ML ~~LOC~~ SOSY
6.0000 mg | PREFILLED_SYRINGE | Freq: Once | SUBCUTANEOUS | Status: AC
  Administered 2022-11-02: 6 mg via SUBCUTANEOUS
  Filled 2022-11-02: qty 0.6

## 2022-11-08 ENCOUNTER — Telehealth: Payer: Self-pay

## 2022-11-08 NOTE — Telephone Encounter (Signed)
Notified Patient of completion of FMLA Forms. Fax transmission confirmation received. Copy of forms emailed to Patient as requested. No other needs or concerns voiced at this time. 

## 2022-11-09 ENCOUNTER — Encounter: Payer: Self-pay | Admitting: Hematology and Oncology

## 2022-11-10 ENCOUNTER — Encounter: Payer: Self-pay | Admitting: Physician Assistant

## 2022-11-10 ENCOUNTER — Inpatient Hospital Stay (HOSPITAL_BASED_OUTPATIENT_CLINIC_OR_DEPARTMENT_OTHER): Payer: BC Managed Care – PPO | Admitting: Physician Assistant

## 2022-11-10 ENCOUNTER — Telehealth: Payer: Self-pay | Admitting: Hematology and Oncology

## 2022-11-10 VITALS — BP 119/83 | HR 99 | Temp 98.6°F | Resp 16 | Wt 219.3 lb

## 2022-11-10 DIAGNOSIS — R682 Dry mouth, unspecified: Secondary | ICD-10-CM | POA: Diagnosis not present

## 2022-11-10 DIAGNOSIS — Z21 Asymptomatic human immunodeficiency virus [HIV] infection status: Secondary | ICD-10-CM | POA: Diagnosis not present

## 2022-11-10 DIAGNOSIS — Z888 Allergy status to other drugs, medicaments and biological substances status: Secondary | ICD-10-CM | POA: Diagnosis not present

## 2022-11-10 DIAGNOSIS — R161 Splenomegaly, not elsewhere classified: Secondary | ICD-10-CM | POA: Diagnosis not present

## 2022-11-10 DIAGNOSIS — Z5111 Encounter for antineoplastic chemotherapy: Secondary | ICD-10-CM | POA: Diagnosis not present

## 2022-11-10 DIAGNOSIS — N183 Chronic kidney disease, stage 3 unspecified: Secondary | ICD-10-CM | POA: Diagnosis not present

## 2022-11-10 DIAGNOSIS — Z5189 Encounter for other specified aftercare: Secondary | ICD-10-CM | POA: Diagnosis not present

## 2022-11-10 DIAGNOSIS — R21 Rash and other nonspecific skin eruption: Secondary | ICD-10-CM

## 2022-11-10 DIAGNOSIS — C8174 Other classical Hodgkin lymphoma, lymph nodes of axilla and upper limb: Secondary | ICD-10-CM | POA: Diagnosis not present

## 2022-11-10 DIAGNOSIS — Z79899 Other long term (current) drug therapy: Secondary | ICD-10-CM | POA: Diagnosis not present

## 2022-11-10 DIAGNOSIS — Z5112 Encounter for antineoplastic immunotherapy: Secondary | ICD-10-CM | POA: Diagnosis not present

## 2022-11-10 DIAGNOSIS — C8118 Nodular sclerosis classical Hodgkin lymphoma, lymph nodes of multiple sites: Secondary | ICD-10-CM | POA: Diagnosis not present

## 2022-11-10 DIAGNOSIS — Z9049 Acquired absence of other specified parts of digestive tract: Secondary | ICD-10-CM | POA: Diagnosis not present

## 2022-11-10 MED ORDER — PERMETHRIN 5 % EX CREA: 1.0000 | TOPICAL_CREAM | Freq: Every day | CUTANEOUS | 0 refills | Status: DC

## 2022-11-10 MED ORDER — IVERMECTIN 3 MG PO TABS: 200.0000 ug/kg | ORAL_TABLET | Freq: Every day | ORAL | 0 refills | Status: DC

## 2022-11-10 NOTE — Telephone Encounter (Signed)
Reached out to patient to make him aware of new appointment times, left voicemail will mail calendars.

## 2022-11-10 NOTE — Progress Notes (Addendum)
Symptom Management Consult Note [facility]    Patient Care Team: Shirline Frees, NP as PCP - General (Family Medicine) Sidney Ace, MD as Referring Physician (Allergy) Jaci Standard, MD as Consulting Physician (Hematology and Oncology)    Name / MRN / DOB: Lee [name]  [id number]  Mar 28, 1981   Date of visit: 11/10/2022   Chief Complaint/Reason for visit: rash   Current Therapy: A-AVD  Last treatment:  Day 1   Cycle 1 on 10/31/22   ASSESSMENT & PLAN: Patient is a 42 y.o. male  with oncologic history of Hodgkin's Lymphoma, Stage III followed by Dr. Leonides Schanz.  I have viewed most recent oncology note and lab work.    #Hodgkin's Lymphoma, Stage III  - Tolerated first treatment with minimal side effects per patient - Had axillary lymphadenopathy prior to starting treatment. Will need to be monitored at future appointments. - Next appointment with oncologist is 11/15/22   #Rash - Rash on bilateral hands, in webspace. Rash is concerning for scabies. He also has extremely dry and crusted skin on bilateral feet. This is suggestive of scabs vs crusted scabies.  - Patient's history of being immunocompromised warrants treatment with both Permethrin cream and PO Ivermectin. Prescriptions for both were sent to the pharmacy. Can take OTC benadryl for pruritus. - Referral sent to dermatology rash persists despite treatment making another diagnosis probable. Discussed with Dr. Leonides Schanz.  Strict ED precautions discussed should symptoms worsen.     Heme/Onc History: Oncology History  Hodgkin lymphoma  10/23/2022 Initial Diagnosis   Hodgkin lymphoma   10/23/2022 Cancer Staging   Staging form: Hodgkin and Non-Hodgkin Lymphoma, AJCC 8th Edition - Clinical stage from 10/23/2022: Stage III (Hodgkin lymphoma, B - Symptoms) - Signed by Jaci Standard, MD on 10/23/2022 Stage prefix: Initial diagnosis Symptoms at diagnosis (B symptoms): Fever, Weight loss, Night sweats    10/31/2022 -  Chemotherapy   Patient is on Treatment Plan : HODGKINS LYMPHOMA  A + AVD q28d         Interval history-: Lee [name] is a 42 y.o. male with oncologic history as above presenting to [facility] today with chief complaint of rash x 5 days. He is accompanied by his mother who provides additional history.  Patient states he first noted the rash to the webspace between the fingers of right hand x 5 days ago He describes the rash as small raised bumps that itched. He has also been experiencing extremely dry peeling skin on his hands and feet for which he hs been applying Eucerin cream tice per day. The second day of symptoms he noticed rash was getting worse. He tried applying neosporin incase there was a developing infection. He reports mild pain when spreading his fingers out. He has tried putting gauze between his fingers to help with the discomfort as well. The rash presented on his left hand in webspace between fingers last night. Presentation is very similar to how it started on the right hand. He denies history of similar rash or anyone with similar rash. Patient is also having swelling of lymph nodes in his right axilla over the last several day. He had a new node he noticed yesterday. Lymph nodes are tender to the touch.  He denies any fevers or chills.      ROS  All other systems are reviewed and are negative for acute change except as noted in the HPI.    Allergies  Allergen Reactions  Gabapentin Nausea And Vomiting   Lisinopril Other (See Comments)    Dry cough     Nickel Rash     Past Medical History:  Diagnosis Date   CKD (chronic kidney disease) stage 3, GFR 30-59 ml/min (HCC) 02/20/2019   Eczema    Environmental allergies    Essential hypertension    HIV test positive (HCC)    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HCC) 07/24/2016   Shingles      Past Surgical History:  Procedure Laterality Date   CHOLECYSTECTOMY N/A 12/30/2021   Procedure:  LAPAROSCOPIC CHOLECYSTECTOMY WITH  CHOLANGIOGRAM;  Surgeon: Darnell Level, MD;  Location: [facility];  Service: General;  Laterality: N/A;   IR IMAGING GUIDED PORT INSERTION  10/18/2022   LAPAROSCOPIC APPENDECTOMY N/A 03/12/2020   Procedure: APPENDECTOMY LAPAROSCOPIC;  Surgeon: Romie Levee, MD;  Location: [facility];  Service: General;  Laterality: N/A;   TYMPANOSTOMY TUBE PLACEMENT     WISDOM TOOTH EXTRACTION     WRIST SURGERY      Social History   Socioeconomic History   Marital status: Single    Spouse name: Not on file   Number of children: Not on file   Years of education: Not on file   Highest education level: Associate degree: academic program  Occupational History   Not on file  Tobacco Use   Smoking status: Never   Smokeless tobacco: Never  Vaping Use   Vaping Use: Never used  Substance and Sexual Activity   Alcohol use: No   Drug use: No   Sexual activity: Not Currently    Partners: Male    Birth control/protection: Condom    Comment: Given Condoms  Other Topics Concern   Not on file  Social History Narrative   Works as Acupuncturist    -Not married   Social Determinants of Health   Financial Resource Strain: Low Risk  (02/09/2022)   Overall Financial Resource Strain (CARDIA)    Difficulty of Paying Living Expenses: Not hard at all  Food Insecurity: No Food Insecurity (06/27/2022)   Hunger Vital Sign    Worried About Running Out of Food in the Last Year: Never true    Ran Out of Food in the Last Year: Never true  Transportation Needs: No Transportation Needs (06/27/2022)   PRAPARE - Administrator, Civil Service (Medical): No    Lack of Transportation (Non-Medical): No  Physical Activity: Unknown (02/09/2022)   Exercise Vital Sign    Days of Exercise per Week: 0 days    Minutes of Exercise per Session: Not on file  Stress: Stress Concern Present (02/09/2022)   [employer] of Occupational Health - Occupational Stress Questionnaire    Feeling of Stress : To  some extent  Social Connections: Moderately Integrated (02/09/2022)   Social Connection and Isolation Panel [NHANES]    Frequency of Communication with Friends and Family: More than three times a week    Frequency of Social Gatherings with Friends and Family: Not on file    Attends Religious Services: More than 4 times per year    Active Member of [employer] or Organizations: Yes    Attends Engineer, structural: More than 4 times per year    Marital Status: Never married  Intimate Partner Violence: Not on file    Family History  Problem Relation Age of Onset   Hypercalcemia Mother    Hypertension Mother    Hypertension Father      Current Outpatient Medications:  ivermectin (STROMECTOL) 3 MG TABS tablet, Take 6 tablets (18,000 mcg total) by mouth daily for 5 days. Take a pill on days 1, 2, 8, 9, and 15, Disp: 30 tablet, Rfl: 0   permethrin (ELIMITE) 5 % cream, Apply 1 Application topically daily for 7 days. Apply to entire body; leave on for 8 to 14 hours before removing by washing (shower or bath). Repeat this regimen daily for 7 days, Disp: 60 g, Rfl: 0   albuterol (PROVENTIL HFA;VENTOLIN HFA) 108 (90 Base) MCG/ACT inhaler, Inhale 2 puffs into the lungs every 6 (six) hours as needed for wheezing or shortness of breath., Disp: 1 Inhaler, Rfl: 1   amLODipine (NORVASC) 10 MG tablet, Take 1 tablet by mouth once daily, Disp: 30 tablet, Rfl: 0   azelastine (ASTELIN) 0.1 % nasal spray, 1-2 puffs in each nostril twice daily, Disp: , Rfl:    azelastine (OPTIVAR) 0.05 % ophthalmic solution, Apply to eye., Disp: , Rfl:    [name] 50-200-25 MG TABS tablet, TAKE 1 TABLET DAILY, Disp: 30 tablet, Rfl: 5   BREO ELLIPTA 200-25 MCG/ACT AEPB, Inhale 1 puff into the lungs daily., Disp: , Rfl:    EPINEPHrine 0.3 mg/0.3 mL IJ SOAJ injection, Inject 0.3 mg into the muscle once., Disp: , Rfl:    lidocaine-prilocaine (EMLA) cream, Apply 1 Application topically as needed., Disp: 30 g, Rfl: 0    montelukast (SINGULAIR) 10 MG tablet, Take 10 mg by mouth at bedtime., Disp: , Rfl:    ondansetron (ZOFRAN) 8 MG tablet, Take 1 tablet (8 mg total) by mouth every 8 (eight) hours as needed., Disp: 30 tablet, Rfl: 0   ondansetron (ZOFRAN-ODT) 8 MG disintegrating tablet, Take 1 tablet (8 mg total) by mouth every 8 (eight) hours as needed for nausea or vomiting., Disp: 30 tablet, Rfl: 0   prochlorperazine (COMPAZINE) 10 MG tablet, Take 1 tablet (10 mg total) by mouth every 6 (six) hours as needed for nausea or vomiting., Disp: 30 tablet, Rfl: 0   topiramate (TOPAMAX) 50 MG tablet, Take 0.5 tablets (25 mg total) by mouth daily., Disp: 30 tablet, Rfl: 0   Vitamin D, Ergocalciferol, (DRISDOL) 1.25 MG (50000 UT) CAPS capsule, Take 50,000 Units by mouth once a week. Friday, Disp: , Rfl:   PHYSICAL EXAM: ECOG FS:1 - Symptomatic but completely ambulatory    Vitals:   11/10/22 1517  BP: 119/83  Pulse: 99  Resp: 16  Temp: 98.6 F (37 C)  TempSrc: Oral  SpO2: 100%  Weight: 219 lb 4.8 oz (99.5 kg)   Physical Exam Vitals and nursing note reviewed.  Constitutional:      Appearance: He is not ill-appearing or toxic-appearing.  HENT:     Head: Normocephalic.  Eyes:     Conjunctiva/sclera: Conjunctivae normal.  Cardiovascular:     Rate and Rhythm: Normal rate and regular rhythm.     Pulses: Normal pulses.     Heart sounds: Normal heart sounds.  Pulmonary:     Effort: Pulmonary effort is normal.     Breath sounds: Normal breath sounds.  Abdominal:     General: There is no distension.  Musculoskeletal:     Cervical back: Normal range of motion.  Lymphadenopathy:     Comments: 3 palpable lymph nodes in right axilla. Tender to deep palpation. No overlying erythema.  Skin:    General: Skin is warm and dry.     Capillary Refill: Capillary refill takes less than 2 seconds.     Findings: Rash present.  Comments: Excoriated papules in webspace between fingers on bilateral hands. Please see  media below.  Skin on bilateral soles is dry, cracked, and thickened. No bleeding.  Neurological:     Mental Status: He is alert.           LABORATORY DATA: I have reviewed the data as listed    Latest Ref Rng & Units 10/31/2022    7:59 AM 10/06/2022    2:59 PM 09/13/2022   12:43 PM  CBC  WBC 4.0 - 10.5 K/uL 5.0  5.1  4.2   Hemoglobin 13.0 - 17.0 g/dL 16.1  09.6  04.5   Hematocrit 39.0 - 52.0 % 33.4  34.8  36.5   Platelets 150 - 400 K/uL 232  211  221         Latest Ref Rng & Units 10/31/2022    7:59 AM 10/06/2022    2:59 PM 09/13/2022   12:43 PM  CMP  Glucose 70 - 99 mg/dL 89  76  82   BUN 6 - 20 mg/dL Creatinine 0.61 - 1.24 mg/dL 4.09  8.11  9.14   Sodium 135 - 145 mmol/L 132  130  134   Potassium 3.5 - 5.1 mmol/L 3.7  3.9  3.8   Chloride 98 - 111 mmol/L 103  102  106   CO2 22 - 32 mmol/L Calcium 8.9 - 10.3 mg/dL 9.1  8.9  8.7   Total Protein 6.5 - 8.1 g/dL 78.2  >95.6    Total Bilirubin 0.3 - 1.2 mg/dL 0.3  0.3    Alkaline Phos 38 - 126 U/L 83  82    AST 15 - 41 U/L 20  23    ALT 0 - 44 U/L 9  12         RADIOGRAPHIC STUDIES (from last 24 hours if applicable) I have personally reviewed the radiological images as listed and agreed with the findings in the report. No results found.      Visit Diagnosis: 1. Rash and nonspecific skin eruption      Orders Placed This Encounter  Procedures   Ambulatory referral to Dermatology    Referral Priority:   Urgent    Referral Type:   Consultation    Referral Reason:   Specialty Services Required    Referred to Provider:   Terri Piedra, MD    Requested Specialty:   Dermatology    Number of Visits Requested:   1    All questions were answered. The patient knows to call the clinic with any problems, questions or concerns. No barriers to learning was detected.  I have spent a total of 30 minutes minutes of face-to-face and non-face-to-face time, preparing to see the patient,  obtaining and/or reviewing separately obtained history, performing a medically appropriate examination, counseling and educating the patient, ordering tests, documenting clinical information in the electronic health record, and care coordination (communications with other health care professionals or caregivers).    Thank you for allowing me to participate in the care of this patient.    Shanon Ace, PA-C Department of Hematology/Oncology [facility] at [facility] Phone: [phone]  [phone]    11/10/2022 11:01 PM

## 2022-11-11 ENCOUNTER — Other Ambulatory Visit: Payer: Self-pay

## 2022-11-11 MED ORDER — METHYLPREDNISOLONE 4 MG PO TBPK: ORAL_TABLET | ORAL | 0 refills | Status: DC

## 2022-11-11 NOTE — Addendum Note (Signed)
Addended by: Shanon Ace on: 11/11/2022 08:38 AM   Modules accepted: Orders

## 2022-11-13 ENCOUNTER — Encounter: Payer: Self-pay | Admitting: Hematology and Oncology

## 2022-11-14 ENCOUNTER — Ambulatory Visit: Payer: BC Managed Care – PPO

## 2022-11-14 ENCOUNTER — Other Ambulatory Visit: Payer: BC Managed Care – PPO

## 2022-11-14 ENCOUNTER — Ambulatory Visit: Payer: BC Managed Care – PPO | Admitting: Hematology and Oncology

## 2022-11-14 MED FILL — Dexamethasone Sodium Phosphate Inj 100 MG/10ML: INTRAMUSCULAR | Qty: 1 | Status: AC

## 2022-11-14 MED FILL — Fosaprepitant Dimeglumine For IV Infusion 150 MG (Base Eq): INTRAVENOUS | Qty: 5 | Status: AC

## 2022-11-14 NOTE — Progress Notes (Unsigned)
[facility] [phone]   Fax:([phone]) [phone]  PROGRESS NOTE  Patient Care Team: Shirline Frees, NP as PCP - General (Family Medicine) Sidney Ace, MD as Referring Physician (Allergy) Jaci Standard, MD as Consulting Physician (Hematology and Oncology)  Hematological/Oncological History # Hodgkin's Lymphoma, Stage III 06/08/2022: CT neck showed Bulky adenopathy in the deep right pectoral region, recommend chest CT with contrast. There was splenomegaly on abdominal CT from the summer, consider addition of abdominal CT is well. 07/01/2023: CT chest showed Bulky RIGHT axillary lymph node enlargement, with nodal mass measuring 7 cm in length. Findings suspicious for lymphoproliferative disease 09/13/2022: US guided biopsy of right axillary lymphadenopathy was consistent with classical Hodgkin's lymphoma  10/06/2022: establish care with Dr. Leonides Schanz  10/31/2022: Cycle 1 Day 1 of A-AVD chemotherapy.    Interval History:  Lee [name] 42 y.o. male with medical history significant for stage III nodular sclerosing Hodgkin's lymphoma who presents for a follow up visit. The patient's last visit was on 10/31/2022. In the interim since the last visit he has started A-AVD chemotherapy.   On exam today Mr. Brassfield is seen in infusion.  He reports he tolerated his first round of chemotherapy quite well.  He notes that he did have some fatigue throughout the course and did have some diarrhea mostly on Tuesday and Wednesday.  By Thursday had resolved.  He reports he has not been having any pain.  His primary concern is the dermatological issues.  He is developed these skin lesions under his arm and 1 on his chin.  He also has some rash in between his fingers on his right hand.  His left hand is doing much better.  He has been using Eucerin cream which has been helping with the dryness of his hands.  He reports that he did have to take 1 Vicodin for a headache but for the most part  has not required supportive medications.  He is taken a few Compazine that he took "just in case" on Friday Saturday and Sunday.  He reports he also takes Claritin in order to help with the bone pain which he did have about 2 to 3 days after his shot.  He has lost about 6 pounds in the interim since our last visit.  He is willing and able to proceed with treatment today.  A full 10 point ROS is otherwise negative.  MEDICAL HISTORY:  Past Medical History:  Diagnosis Date   CKD (chronic kidney disease) stage 3, GFR 30-59 ml/min (HCC) 02/20/2019   Eczema    Environmental allergies    Essential hypertension    HIV test positive (HCC)    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HCC) 07/24/2016   Shingles     SURGICAL HISTORY: Past Surgical History:  Procedure Laterality Date   CHOLECYSTECTOMY N/A 12/30/2021   Procedure: LAPAROSCOPIC CHOLECYSTECTOMY WITH  CHOLANGIOGRAM;  Surgeon: Darnell Level, MD;  Location: [facility];  Service: General;  Laterality: N/A;   IR IMAGING GUIDED PORT INSERTION  10/18/2022   LAPAROSCOPIC APPENDECTOMY N/A 03/12/2020   Procedure: APPENDECTOMY LAPAROSCOPIC;  Surgeon: Romie Levee, MD;  Location: [facility];  Service: General;  Laterality: N/A;   TYMPANOSTOMY TUBE PLACEMENT     WISDOM TOOTH EXTRACTION     WRIST SURGERY      SOCIAL HISTORY: Social History   Socioeconomic History   Marital status: Single    Spouse name: Not on file   Number of children: Not on file   Years  of education: Not on file   Highest education level: Associate degree: academic program  Occupational History   Not on file  Tobacco Use   Smoking status: Never   Smokeless tobacco: Never  Vaping Use   Vaping Use: Never used  Substance and Sexual Activity   Alcohol use: No   Drug use: No   Sexual activity: Not Currently    Partners: Male    Birth control/protection: Condom    Comment: Given Condoms  Other Topics Concern   Not on file  Social History Narrative   Works as Acupuncturist    -Not  married   Social Determinants of Health   Financial Resource Strain: Low Risk  (02/09/2022)   Overall Financial Resource Strain (CARDIA)    Difficulty of Paying Living Expenses: Not hard at all  Food Insecurity: No Food Insecurity (06/27/2022)   Hunger Vital Sign    Worried About Running Out of Food in the Last Year: Never true    Ran Out of Food in the Last Year: Never true  Transportation Needs: No Transportation Needs (06/27/2022)   PRAPARE - Administrator, Civil Service (Medical): No    Lack of Transportation (Non-Medical): No  Physical Activity: Unknown (02/09/2022)   Exercise Vital Sign    Days of Exercise per Week: 0 days    Minutes of Exercise per Session: Not on file  Stress: Stress Concern Present (02/09/2022)   [employer] of Occupational Health - Occupational Stress Questionnaire    Feeling of Stress : To some extent  Social Connections: Moderately Integrated (02/09/2022)   Social Connection and Isolation Panel [NHANES]    Frequency of Communication with Friends and Family: More than three times a week    Frequency of Social Gatherings with Friends and Family: Not on file    Attends Religious Services: More than 4 times per year    Active Member of [employer] or Organizations: Yes    Attends Engineer, structural: More than 4 times per year    Marital Status: Never married  Catering manager Violence: Not on file    FAMILY HISTORY: Family History  Problem Relation Age of Onset   Hypercalcemia Mother    Hypertension Mother    Hypertension Father     ALLERGIES:  is allergic to gabapentin, lisinopril, and nickel.  MEDICATIONS:  Current Outpatient Medications  Medication Sig Dispense Refill   albuterol (PROVENTIL HFA;VENTOLIN HFA) 108 (90 Base) MCG/ACT inhaler Inhale 2 puffs into the lungs every 6 (six) hours as needed for wheezing or shortness of breath. 1 Inhaler 1   amLODipine (NORVASC) 10 MG tablet Take 1 tablet by mouth once daily 30 tablet 0    azelastine (ASTELIN) 0.1 % nasal spray 1-2 puffs in each nostril twice daily     azelastine (OPTIVAR) 0.05 % ophthalmic solution Apply to eye.     [name] 50-200-25 MG TABS tablet TAKE 1 TABLET DAILY 30 tablet 5   BREO ELLIPTA 200-25 MCG/ACT AEPB Inhale 1 puff into the lungs daily.     EPINEPHrine 0.3 mg/0.3 mL IJ SOAJ injection Inject 0.3 mg into the muscle once.     lidocaine-prilocaine (EMLA) cream Apply 1 Application topically as needed. 30 g 0   methylPREDNISolone (MEDROL DOSEPAK) 4 MG TBPK tablet Take 6 pills by mouth day 1, 5 on day 2, 4 on day 3, 3 on day 4, 2 on day 5, 1 on day 6 21 tablet 0   montelukast (SINGULAIR) 10 MG tablet Take  10 mg by mouth at bedtime.     ondansetron (ZOFRAN) 8 MG tablet Take 1 tablet (8 mg total) by mouth every 8 (eight) hours as needed. 30 tablet 0   ondansetron (ZOFRAN-ODT) 8 MG disintegrating tablet Take 1 tablet (8 mg total) by mouth every 8 (eight) hours as needed for nausea or vomiting. 30 tablet 0   prochlorperazine (COMPAZINE) 10 MG tablet Take 1 tablet (10 mg total) by mouth every 6 (six) hours as needed for nausea or vomiting. 30 tablet 0   topiramate (TOPAMAX) 50 MG tablet Take 0.5 tablets (25 mg total) by mouth daily. 30 tablet 0   Vitamin D, Ergocalciferol, (DRISDOL) 1.25 MG (50000 UT) CAPS capsule Take 50,000 Units by mouth once a week. Friday     No current facility-administered medications for this visit.   Facility-Administered Medications Ordered in Other Visits  Medication Dose Route Frequency Provider Last Rate Last Admin   sodium chloride flush (NS) 0.9 % injection 10 mL  10 mL Intracatheter PRN Jaci Standard, MD   10 mL at 11/15/22 1418    REVIEW OF SYSTEMS:   Constitutional: ( - ) fevers, ( - )  chills , ( - ) night sweats Eyes: ( - ) blurriness of vision, ( - ) double vision, ( - ) watery eyes Ears, nose, mouth, throat, and face: ( - ) mucositis, ( - ) sore throat Respiratory: ( - ) cough, ( - ) dyspnea, ( - )  wheezes Cardiovascular: ( - ) palpitation, ( - ) chest discomfort, ( - ) lower extremity swelling Gastrointestinal:  ( - ) nausea, ( - ) heartburn, ( - ) change in bowel habits Skin: ( - ) abnormal skin rashes Lymphatics: ( - ) new lymphadenopathy, ( - ) easy bruising Neurological: ( - ) numbness, ( - ) tingling, ( - ) new weaknesses Behavioral/Psych: ( - ) mood change, ( - ) new changes  All other systems were reviewed with the patient and are negative.  PHYSICAL EXAMINATION: ECOG PERFORMANCE STATUS: 1 - Symptomatic but completely ambulatory  There were no vitals filed for this visit. There were no vitals filed for this visit.  GENERAL: Well-appearing young African-American male, alert, no distress and comfortable SKIN: skin color, texture, turgor are normal, no rashes or significant lesions EYES: conjunctiva are pink and non-injected, sclera clear LUNGS: clear to auscultation and percussion with normal breathing effort HEART: regular rate & rhythm and no murmurs and no lower extremity edema Musculoskeletal: no cyanosis of digits and no clubbing  PSYCH: alert & oriented x 3, fluent speech NEURO: no focal motor/sensory deficits  LABORATORY DATA:  I have reviewed the data as listed    Latest Ref Rng & Units 11/15/2022    9:13 AM 10/31/2022    7:59 AM 10/06/2022    2:59 PM  CBC  WBC 4.0 - 10.5 K/uL 17.5  5.0  5.1   Hemoglobin 13.0 - 17.0 g/dL 40.9  81.1  91.4   Hematocrit 39.0 - 52.0 % 35.6  33.4  34.8   Platelets 150 - 400 K/uL 265  232  211        Latest Ref Rng & Units 11/15/2022    9:13 AM 10/31/2022    7:59 AM 10/06/2022    2:59 PM  CMP  Glucose 70 - 99 mg/dL 94  89  76   BUN 6 - 20 mg/dL 19  16  17    Creatinine 0.61 - 1.24 mg/dL 7.82  9.56  1.68   Sodium 135 - 145 mmol/L 137  132  130   Potassium 3.5 - 5.1 mmol/L 3.8  3.7  3.9   Chloride 98 - 111 mmol/L 104  103  102   CO2 22 - 32 mmol/L 30  25  24    Calcium 8.9 - 10.3 mg/dL 9.0  9.1  8.9   Total Protein 6.5 - 8.1 g/dL  9.5  04.5  >40.9   Total Bilirubin 0.3 - 1.2 mg/dL 0.2  0.3  0.3   Alkaline Phos 38 - 126 U/L 101  83  82   AST 15 - 41 U/L 21  20  23    ALT 0 - 44 U/L 18  9  12       RADIOGRAPHIC STUDIES: I have personally reviewed the radiological images as listed and agreed with the findings in the report: FDG avid lymph nodes on both sides of the diaphragm, most prominently in the cervical regions and axilla.  Consistent with at least stage III disease. NM PET Image Initial (PI) Skull Base To Thigh  Result Date: 10/23/2022 CLINICAL DATA:  Initial treatment strategy for Hodgkin lymphoma. EXAM: NUCLEAR MEDICINE PET SKULL BASE TO THIGH TECHNIQUE: 11.1 mCi F-18 FDG was injected intravenously. Full-ring PET imaging was performed from the skull base to thigh after the radiotracer. CT data was obtained and used for attenuation correction and anatomic localization. Fasting blood glucose: 86 mg/dl COMPARISON:  CT chest 81/19/1478 and CT abdomen pelvis 01/23/2022. FINDINGS: Mediastinal blood pool activity: SUV max 2.1 Liver activity: SUV max 2.8 NECK: Symmetric hypermetabolism throughout the parotid glands, submandibular glands and nasopharynx. No definite hypermetabolic lymph nodes. Incidental CT findings: Fluid in the maxillary sinuses. CHEST: Extensive right subpectoral and axillary hypermetabolic adenopathy. Index right axillary lymph node measures 3.3 cm (4/58), SUV max 14.2. Hypermetabolic mediastinal and hilar lymph nodes. Index left hilar hypermetabolism, SUV max 5.7. Left axillary lymph nodes do not show metabolism above blood pool. Incidental CT findings: Right IJ Port-A-Cath terminates in the right atrium. Heart is enlarged. No pericardial or pleural effusion. Evaluation of the lungs is limited due to respiratory motion and thick slice collimation. Slight nodularity along the fissures appears similar to 06/30/2022. ABDOMEN/PELVIS: Splenic metabolism, SUV max 3.5, is minimally above liver, 2.8. Hypermetabolic  abdominal and retroperitoneal lymph nodes. Index left periaortic lymph node measures 11 mm (4/124), SUV max 4.9. Index right external iliac lymph node measures 12 mm (4/185), SUV max 3.7. Incidental CT findings: Liver is likely enlarged. Liver and spleen are enlarged. Accurate measurement is challenging without coronal imaging and with respiratory motion. Cholecystectomy. Adrenal glands, kidneys, pancreas, stomach and bowel are grossly unremarkable. SKELETON: No abnormal hypermetabolism. Incidental CT findings: None. IMPRESSION: 1. Hypermetabolic adenopathy in the chest, abdomen and pelvis, compatible with Hodgkin lymphoma ([place] 4-5). 2. Hepatosplenomegaly. Electronically Signed   By: Leanna Battles M.D.   On: 10/23/2022 12:57   IR IMAGING GUIDED PORT INSERTION  Result Date: 10/18/2022 INDICATION: History of lymphoma. In need of durable intravenous access for chemotherapy administration. EXAM: IMPLANTED PORT A CATH PLACEMENT WITH ULTRASOUND AND FLUOROSCOPIC GUIDANCE COMPARISON:  Chest CT-06/30/2022 MEDICATIONS: None ANESTHESIA/SEDATION: Moderate (conscious) sedation was employed during this procedure as administered by the Interventional Radiology RN. A total of Versed 2 mg and Fentanyl 100 mcg was administered intravenously. Moderate Sedation Time: 27 minutes. The patient's level of consciousness and vital signs were monitored continuously by radiology nursing throughout the procedure under my direct supervision. CONTRAST:  None FLUOROSCOPY TIME:  40 seconds (3 mGy)  COMPLICATIONS: None immediate. PROCEDURE: The procedure, risks, benefits, and alternatives were explained to the patient. Questions regarding the procedure were encouraged and answered. The patient understands and consents to the procedure. The right neck and chest were prepped with chlorhexidine in a sterile fashion, and a sterile drape was applied covering the operative field. Maximum barrier sterile technique with sterile gowns and gloves  were used for the procedure. A timeout was performed prior to the initiation of the procedure. Local anesthesia was provided with 1% lidocaine with epinephrine. After creating a small venotomy incision, a micropuncture kit was utilized to access the internal jugular vein. Real-time ultrasound guidance was utilized for vascular access including the acquisition of a permanent ultrasound image documenting patency of the accessed vessel. The microwire was utilized to measure appropriate catheter length. A subcutaneous port pocket was then created along the upper chest wall utilizing a combination of sharp and blunt dissection. The pocket was irrigated with sterile saline. A single lumen clear view power injectable port was chosen for placement. (Clear view Port a catheter was selected due to patient's history of a nickel allergy). The 8 Fr catheter was tunneled from the port pocket site to the venotomy incision. The port was placed in the pocket. The external catheter was trimmed to appropriate length. At the venotomy, an 8 Fr peel-away sheath was placed over a guidewire under fluoroscopic guidance. The catheter was then placed through the sheath and the sheath was removed. Final catheter positioning was confirmed and documented with a fluoroscopic spot radiograph. The port was accessed with a Huber needle, aspirated and flushed with heparinized saline. The venotomy site was closed with an interrupted 4-0 Vicryl suture. The port pocket incision was closed with interrupted 2-0 Vicryl suture. The skin was opposed with a running subcuticular 4-0 Vicryl suture. Dermabond and Steri-strips were applied to both incisions. Dressings were applied. The patient tolerated the procedure well without immediate post procedural complication. FINDINGS: After catheter placement, the tip lies within the superior cavoatrial junction. The catheter aspirates and flushes normally and is ready for immediate use. IMPRESSION: Successful  placement of a right internal jugular approach power injectable Port-A-Cath. The catheter is ready for immediate use. Electronically Signed   By: Simonne Come M.D.   On: 10/18/2022 16:36    ASSESSMENT & PLAN Giani Betzold 42 y.o. male with medical history significant for stage III nodular sclerosing Hodgkin's lymphoma who presents for a follow up visit.   After review of the labs, review of the records, and discussion with the patient the patients findings are most consistent with classical Hodgkin's Lymphoma.    # Hodgkin's Lymphoma, Stage III -- PET CT scan staging complete, findings consistent with stage III Hodgkin's lymphoma. --Will plan to proceed with A-AVD chemotherapy. --Echocardiogram complete, shows excellent baseline cardiac function. Plan:  --Labs today show creatinine 1.32, WBC 17.5, MCV 83.6, Hgb 12.5, Plt 265 --OK to proceed with Cycle 1 Day 15 of A-AVD chemotherapy today --Plan for interval PET CT scan after Cycle 2-Day 15 --RTC in 2 weeks for Cycle 2 Day 1 of treatment    #Supportive Care -- chemotherapy education complete -- port placed -- zofran  q8H PRN and compazine  PO q6H for nausea -- EMLA cream for port -- no pain medication required at this time.   No orders of the defined types were placed in this encounter.   All questions were answered. The patient knows to call the clinic with any problems, questions or concerns.  A total  of more than 30 minutes were spent on this encounter with face-to-face time and non-face-to-face time, including preparing to see the patient, ordering tests and/or medications, counseling the patient and coordination of care as outlined above.   Ulysees Barns, MD Department of Hematology/Oncology [facility] Phone: [phone] Pager: [phone] Email: [email or web address]  11/15/2022 3:44 PM

## 2022-11-15 ENCOUNTER — Ambulatory Visit: Payer: BC Managed Care – PPO | Admitting: Physician Assistant

## 2022-11-15 ENCOUNTER — Inpatient Hospital Stay (HOSPITAL_BASED_OUTPATIENT_CLINIC_OR_DEPARTMENT_OTHER): Payer: BC Managed Care – PPO | Admitting: Hematology and Oncology

## 2022-11-15 ENCOUNTER — Ambulatory Visit: Payer: BC Managed Care – PPO

## 2022-11-15 ENCOUNTER — Ambulatory Visit: Payer: BC Managed Care – PPO | Admitting: Hematology and Oncology

## 2022-11-15 ENCOUNTER — Inpatient Hospital Stay: Payer: BC Managed Care – PPO

## 2022-11-15 ENCOUNTER — Other Ambulatory Visit: Payer: BC Managed Care – PPO

## 2022-11-15 VITALS — BP 132/92 | HR 98 | Resp 16 | Wt 219.5 lb

## 2022-11-15 DIAGNOSIS — Z5189 Encounter for other specified aftercare: Secondary | ICD-10-CM | POA: Diagnosis not present

## 2022-11-15 DIAGNOSIS — Z95828 Presence of other vascular implants and grafts: Secondary | ICD-10-CM | POA: Diagnosis not present

## 2022-11-15 DIAGNOSIS — C8118 Nodular sclerosis classical Hodgkin lymphoma, lymph nodes of multiple sites: Secondary | ICD-10-CM

## 2022-11-15 DIAGNOSIS — Z21 Asymptomatic human immunodeficiency virus [HIV] infection status: Secondary | ICD-10-CM | POA: Diagnosis not present

## 2022-11-15 DIAGNOSIS — Z888 Allergy status to other drugs, medicaments and biological substances status: Secondary | ICD-10-CM | POA: Diagnosis not present

## 2022-11-15 DIAGNOSIS — Z5111 Encounter for antineoplastic chemotherapy: Secondary | ICD-10-CM | POA: Diagnosis not present

## 2022-11-15 DIAGNOSIS — Z79899 Other long term (current) drug therapy: Secondary | ICD-10-CM | POA: Diagnosis not present

## 2022-11-15 DIAGNOSIS — C8174 Other classical Hodgkin lymphoma, lymph nodes of axilla and upper limb: Secondary | ICD-10-CM | POA: Diagnosis not present

## 2022-11-15 DIAGNOSIS — N183 Chronic kidney disease, stage 3 unspecified: Secondary | ICD-10-CM | POA: Diagnosis not present

## 2022-11-15 DIAGNOSIS — Z9049 Acquired absence of other specified parts of digestive tract: Secondary | ICD-10-CM | POA: Diagnosis not present

## 2022-11-15 DIAGNOSIS — Z5112 Encounter for antineoplastic immunotherapy: Secondary | ICD-10-CM | POA: Diagnosis not present

## 2022-11-15 DIAGNOSIS — R682 Dry mouth, unspecified: Secondary | ICD-10-CM | POA: Diagnosis not present

## 2022-11-15 DIAGNOSIS — R161 Splenomegaly, not elsewhere classified: Secondary | ICD-10-CM | POA: Diagnosis not present

## 2022-11-15 DIAGNOSIS — R21 Rash and other nonspecific skin eruption: Secondary | ICD-10-CM | POA: Diagnosis not present

## 2022-11-15 LAB — CBC WITH DIFFERENTIAL (CANCER CENTER ONLY)
Abs Immature Granulocytes: 0.41 10*3/uL — ABNORMAL HIGH (ref 0.00–0.07)
Basophils Absolute: 0.1 10*3/uL (ref 0.0–0.1)
Basophils Relative: 1 %
Eosinophils Absolute: 0 10*3/uL (ref 0.0–0.5)
Eosinophils Relative: 0 %
HCT: 35.6 % — ABNORMAL LOW (ref 39.0–52.0)
Hemoglobin: 12.5 g/dL — ABNORMAL LOW (ref 13.0–17.0)
Immature Granulocytes: 2 %
Lymphocytes Relative: 15 %
Lymphs Abs: 2.6 10*3/uL (ref 0.7–4.0)
MCH: 29.3 pg (ref 26.0–34.0)
MCHC: 35.1 g/dL (ref 30.0–36.0)
MCV: 83.6 fL (ref 80.0–100.0)
Monocytes Absolute: 1 10*3/uL (ref 0.1–1.0)
Monocytes Relative: 6 %
Neutro Abs: 13.4 10*3/uL — ABNORMAL HIGH (ref 1.7–7.7)
Neutrophils Relative %: 76 %
Platelet Count: 265 10*3/uL (ref 150–400)
RBC: 4.26 MIL/uL (ref 4.22–5.81)
RDW: 14.3 % (ref 11.5–15.5)
WBC Count: 17.5 10*3/uL — ABNORMAL HIGH (ref 4.0–10.5)
nRBC: 0 % (ref 0.0–0.2)

## 2022-11-15 LAB — CMP (CANCER CENTER ONLY)
ALT: 18 U/L (ref 0–44)
AST: 21 U/L (ref 15–41)
Albumin: 3.2 g/dL — ABNORMAL LOW (ref 3.5–5.0)
Alkaline Phosphatase: 101 U/L (ref 38–126)
Anion gap: 3 — ABNORMAL LOW (ref 5–15)
BUN: 19 mg/dL (ref 6–20)
CO2: 30 mmol/L (ref 22–32)
Calcium: 9 mg/dL (ref 8.9–10.3)
Chloride: 104 mmol/L (ref 98–111)
Creatinine: 1.32 mg/dL — ABNORMAL HIGH (ref 0.61–1.24)
GFR, Estimated: >60 mL/min (ref >60)
Glucose, Bld: 94 mg/dL (ref 70–99)
Potassium: 3.8 mmol/L (ref 3.5–5.1)
Sodium: 137 mmol/L (ref 135–145)
Total Bilirubin: 0.2 mg/dL — ABNORMAL LOW (ref 0.3–1.2)
Total Protein: 9.5 g/dL — ABNORMAL HIGH (ref 6.5–8.1)

## 2022-11-15 MED ORDER — ACETAMINOPHEN 325 MG PO TABS
650.0000 mg | ORAL_TABLET | Freq: Once | ORAL | Status: AC
  Administered 2022-11-15: 650 mg via ORAL
  Filled 2022-11-15: qty 2

## 2022-11-15 MED ORDER — SODIUM CHLORIDE 0.9 % IV SOLN: Freq: Once | INTRAVENOUS | Status: AC

## 2022-11-15 MED ORDER — SODIUM CHLORIDE 0.9 % IV SOLN
120.0000 mg | Freq: Once | INTRAVENOUS | Status: AC
  Administered 2022-11-15: 120 mg via INTRAVENOUS
  Filled 2022-11-15: qty 24

## 2022-11-15 MED ORDER — DIPHENHYDRAMINE HCL 50 MG/ML IJ SOLN
50.0000 mg | Freq: Once | INTRAMUSCULAR | Status: AC
  Administered 2022-11-15: 50 mg via INTRAVENOUS
  Filled 2022-11-15: qty 1

## 2022-11-15 MED ORDER — SODIUM CHLORIDE 0.9 % IV SOLN
375.0000 mg/m2 | Freq: Once | INTRAVENOUS | Status: AC
  Administered 2022-11-15: 800 mg via INTRAVENOUS
  Filled 2022-11-15: qty 80

## 2022-11-15 MED ORDER — SODIUM CHLORIDE 0.9% FLUSH
10.0000 mL | INTRAVENOUS | Status: DC | PRN
  Administered 2022-11-15: 10 mL

## 2022-11-15 MED ORDER — HEPARIN SOD (PORK) LOCK FLUSH 100 UNIT/ML IV SOLN
500.0000 [IU] | Freq: Once | INTRAVENOUS | Status: AC | PRN
  Administered 2022-11-15: 500 [IU]

## 2022-11-15 MED ORDER — PALONOSETRON HCL INJECTION 0.25 MG/5ML
0.2500 mg | Freq: Once | INTRAVENOUS | Status: AC
  Administered 2022-11-15: 0.25 mg via INTRAVENOUS
  Filled 2022-11-15: qty 5

## 2022-11-15 MED ORDER — SODIUM CHLORIDE 0.9 % IV SOLN
10.0000 mg | Freq: Once | INTRAVENOUS | Status: AC
  Administered 2022-11-15: 10 mg via INTRAVENOUS
  Filled 2022-11-15: qty 10

## 2022-11-15 MED ORDER — VINBLASTINE SULFATE CHEMO INJECTION 1 MG/ML
6.0000 mg/m2 | Freq: Once | INTRAVENOUS | Status: AC
  Administered 2022-11-15: 14 mg via INTRAVENOUS
  Filled 2022-11-15: qty 14

## 2022-11-15 MED ORDER — SODIUM CHLORIDE 0.9 % IV SOLN
150.0000 mg | Freq: Once | INTRAVENOUS | Status: AC
  Administered 2022-11-15: 150 mg via INTRAVENOUS
  Filled 2022-11-15: qty 150

## 2022-11-15 MED ORDER — DOXORUBICIN HCL CHEMO IV INJECTION 2 MG/ML
25.0000 mg/m2 | Freq: Once | INTRAVENOUS | Status: AC
  Administered 2022-11-15: 58 mg via INTRAVENOUS
  Filled 2022-11-15: qty 29

## 2022-11-15 NOTE — Patient Instructions (Signed)
[facility]  Discharge Instructions: Thank you for choosing [facility] to provide your oncology and hematology care.   If you have a lab appointment with the Cancer Center, please go directly to the Cancer Center and check in at the registration area.   Wear comfortable clothing and clothing appropriate for easy access to any Portacath or PICC line.   We strive to give you quality time with your provider. You may need to reschedule your appointment if you arrive late (15 or more minutes).  Arriving late affects you and other patients whose appointments are after yours.  Also, if you miss three or more appointments without notifying the office, you may be dismissed from the clinic at the provider's discretion.      For prescription refill requests, have your pharmacy contact our office and allow 72 hours for refills to be completed.    Today you received the following chemotherapy and/or immunotherapy agents: doxorubicin, vinblastine, dacarbazine, brentuximab vedotin      To help prevent nausea and vomiting after your treatment, we encourage you to take your nausea medication as directed.  BELOW ARE SYMPTOMS THAT SHOULD BE REPORTED IMMEDIATELY: *FEVER GREATER THAN 100.4 F (38 C) OR HIGHER *CHILLS OR SWEATING *NAUSEA AND VOMITING THAT IS NOT CONTROLLED WITH YOUR NAUSEA MEDICATION *UNUSUAL SHORTNESS OF BREATH *UNUSUAL BRUISING OR BLEEDING *URINARY PROBLEMS (pain or burning when urinating, or frequent urination) *BOWEL PROBLEMS (unusual diarrhea, constipation, pain near the anus) TENDERNESS IN MOUTH AND THROAT WITH OR WITHOUT PRESENCE OF ULCERS (sore throat, sores in mouth, or a toothache) UNUSUAL RASH, SWELLING OR PAIN  UNUSUAL VAGINAL DISCHARGE OR ITCHING   Items with * indicate a potential emergency and should be followed up as soon as possible or go to the Emergency Department if any problems should occur.  Please show the  CHEMOTHERAPY ALERT CARD or IMMUNOTHERAPY ALERT CARD at check-in to the Emergency Department and triage nurse.  Should you have questions after your visit or need to cancel or reschedule your appointment, please contact [facility]  Dept: [phone]  and follow the prompts.  Office hours are 8:00 a.m. to 4:30 p.m. Monday - Friday. Please note that voicemails left after 4:00 p.m. may not be returned until the following business day.  We are closed weekends and major holidays. You have access to a nurse at all times for urgent questions. Please call the main number to the clinic Dept: [phone] and follow the prompts.   For any non-urgent questions, you may also contact your provider using MyChart. We now offer e-Visits for anyone 18 and older to request care online for non-urgent symptoms. For details visit [email or web address].   Also download the MyChart app! Go to the app store, search "MyChart", open the app, select , and log in with your MyChart username and password.   

## 2022-11-16 ENCOUNTER — Ambulatory Visit: Payer: BC Managed Care – PPO

## 2022-11-17 ENCOUNTER — Inpatient Hospital Stay: Payer: BC Managed Care – PPO

## 2022-11-17 ENCOUNTER — Ambulatory Visit: Payer: BC Managed Care – PPO

## 2022-11-17 ENCOUNTER — Other Ambulatory Visit: Payer: BC Managed Care – PPO

## 2022-11-17 VITALS — BP 130/72 | HR 94 | Temp 98.0°F | Resp 18

## 2022-11-17 DIAGNOSIS — Z5111 Encounter for antineoplastic chemotherapy: Secondary | ICD-10-CM | POA: Diagnosis not present

## 2022-11-17 DIAGNOSIS — C8118 Nodular sclerosis classical Hodgkin lymphoma, lymph nodes of multiple sites: Secondary | ICD-10-CM

## 2022-11-17 DIAGNOSIS — Z79899 Other long term (current) drug therapy: Secondary | ICD-10-CM | POA: Diagnosis not present

## 2022-11-17 DIAGNOSIS — Z5189 Encounter for other specified aftercare: Secondary | ICD-10-CM | POA: Diagnosis not present

## 2022-11-17 DIAGNOSIS — C8174 Other classical Hodgkin lymphoma, lymph nodes of axilla and upper limb: Secondary | ICD-10-CM | POA: Diagnosis not present

## 2022-11-17 DIAGNOSIS — R161 Splenomegaly, not elsewhere classified: Secondary | ICD-10-CM | POA: Diagnosis not present

## 2022-11-17 DIAGNOSIS — Z9049 Acquired absence of other specified parts of digestive tract: Secondary | ICD-10-CM | POA: Diagnosis not present

## 2022-11-17 DIAGNOSIS — Z21 Asymptomatic human immunodeficiency virus [HIV] infection status: Secondary | ICD-10-CM | POA: Diagnosis not present

## 2022-11-17 DIAGNOSIS — Z888 Allergy status to other drugs, medicaments and biological substances status: Secondary | ICD-10-CM | POA: Diagnosis not present

## 2022-11-17 DIAGNOSIS — Z5112 Encounter for antineoplastic immunotherapy: Secondary | ICD-10-CM | POA: Diagnosis not present

## 2022-11-17 DIAGNOSIS — N183 Chronic kidney disease, stage 3 unspecified: Secondary | ICD-10-CM | POA: Diagnosis not present

## 2022-11-17 DIAGNOSIS — R21 Rash and other nonspecific skin eruption: Secondary | ICD-10-CM | POA: Diagnosis not present

## 2022-11-17 DIAGNOSIS — R682 Dry mouth, unspecified: Secondary | ICD-10-CM | POA: Diagnosis not present

## 2022-11-17 MED ORDER — PEGFILGRASTIM-CBQV 6 MG/0.6ML ~~LOC~~ SOSY
6.0000 mg | PREFILLED_SYRINGE | Freq: Once | SUBCUTANEOUS | Status: AC
  Administered 2022-11-17: 6 mg via SUBCUTANEOUS
  Filled 2022-11-17: qty 0.6

## 2022-11-19 ENCOUNTER — Other Ambulatory Visit: Payer: Self-pay | Admitting: Hematology & Oncology

## 2022-11-19 MED ORDER — AMOXICILLIN-POT CLAVULANATE 875-125 MG PO TABS: 1.0000 | ORAL_TABLET | Freq: Two times a day (BID) | ORAL | 0 refills | Status: DC

## 2022-11-19 MED ORDER — HYDROCODONE-ACETAMINOPHEN 5-325 MG PO TABS: 1.0000 | ORAL_TABLET | Freq: Four times a day (QID) | ORAL | 0 refills | Status: DC | PRN

## 2022-11-20 ENCOUNTER — Ambulatory Visit (INDEPENDENT_AMBULATORY_CARE_PROVIDER_SITE_OTHER): Payer: BC Managed Care – PPO | Admitting: Dermatology

## 2022-11-20 ENCOUNTER — Telehealth: Payer: Self-pay | Admitting: *Deleted

## 2022-11-20 ENCOUNTER — Encounter: Payer: Self-pay | Admitting: Dermatology

## 2022-11-20 DIAGNOSIS — L732 Hidradenitis suppurativa: Secondary | ICD-10-CM

## 2022-11-20 DIAGNOSIS — L723 Sebaceous cyst: Secondary | ICD-10-CM | POA: Diagnosis not present

## 2022-11-20 DIAGNOSIS — T148XXA Other injury of unspecified body region, initial encounter: Secondary | ICD-10-CM

## 2022-11-20 MED ORDER — TRIAMCINOLONE ACETONIDE 10 MG/ML IJ SUSP
20.0000 mg | Freq: Once | INTRAMUSCULAR | Status: AC
Start: 2022-11-20 — End: 2022-11-20
  Administered 2022-11-20: 20 mg via INTRADERMAL

## 2022-11-20 MED ORDER — DOXYCYCLINE HYCLATE 100 MG PO CAPS: 100.0000 mg | ORAL_CAPSULE | Freq: Two times a day (BID) | ORAL | 0 refills | Status: AC

## 2022-11-20 MED ORDER — MUPIROCIN 2 % EX OINT: 1.0000 | TOPICAL_OINTMENT | Freq: Two times a day (BID) | CUTANEOUS | 0 refills | Status: DC

## 2022-11-20 NOTE — Patient Instructions (Signed)
Due to recent changes in healthcare laws, you may see results of your pathology and/or laboratory studies on MyChart before the doctors have had a chance to review them. We understand that in some cases there may be results that are confusing or concerning to you. Please understand that not all results are received at the same time and often the doctors may need to interpret multiple results in order to provide you with the best plan of care or course of treatment. Therefore, we ask that you please give us 2 business days to thoroughly review all your results before contacting the office for clarification. Should we see a critical lab result, you will be contacted sooner.   If You Need Anything After Your Visit  If you have any questions or concerns for your doctor, please call our main line at [phone] If no one answers, please leave a voicemail as directed and we will return your call as soon as possible. Messages left after 4 pm will be answered the following business day.   You may also send us a message via MyChart. We typically respond to MyChart messages within 1-2 business days.  For prescription refills, please ask your pharmacy to contact our office. Our fax number is [phone].  If you have an urgent issue when the clinic is closed that cannot wait until the next business day, you can page your doctor at the number below.    Please note that while we do our best to be available for urgent issues outside of office hours, we are not available 24/7.   If you have an urgent issue and are unable to reach us, you may choose to seek medical care at your doctor's office, retail clinic, urgent care center, or emergency room.  If you have a medical emergency, please immediately call 911 or go to the emergency department. In the event of inclement weather, please call our main line at [phone] for an update on the status of any delays or closures.  Dermatology Medication Tips: Please  keep the boxes that topical medications come in in order to help keep track of the instructions about where and how to use these. Pharmacies typically print the medication instructions only on the boxes and not directly on the medication tubes.   If your medication is too expensive, please contact our office at [phone] or send us a message through MyChart.   We are unable to tell what your co-pay for medications will be in advance as this is different depending on your insurance coverage. However, we may be able to find a substitute medication at lower cost or fill out paperwork to get insurance to cover a needed medication.   If a prior authorization is required to get your medication covered by your insurance company, please allow us 1-2 business days to complete this process.  Drug prices often vary depending on where the prescription is filled and some pharmacies may offer cheaper prices.  The website www.goodrx.com contains coupons for medications through different pharmacies. The prices here do not account for what the cost may be with help from insurance (it may be cheaper with your insurance), but the website can give you the price if you did not use any insurance.  - You can print the associated coupon and take it with your prescription to the pharmacy.  - You may also stop by our office during regular business hours and pick up a GoodRx coupon card.  - If you need your   prescription sent electronically to a different pharmacy, notify our office through [facility] MyChart or by phone at [phone]     

## 2022-11-20 NOTE — Telephone Encounter (Signed)
Received  fax from weekend on call service. Pt had called in with sores on his head and right side. The advise was for pt to go to ED. However, pt's chart reveals that he had a dermatology appt this morning and was treated for Hidrenitis Suppurativa. He was started on Doxycycline 1oo mg BID for 7 days. His next appt here will be on 11/28/22

## 2022-11-20 NOTE — Progress Notes (Unsigned)
New Patient Visit   Subjective  Lee [name] is a 42 y.o. male who presents for the following: All over rash.  Patient complains of boils under arms and gluteal area. No hx of boils.  Also complains of open sores on scalp and did have sores on hands but the hands cleared after a round of antibiotics. 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 Second treatment was last Wednesday when rash started on hands and scalp again. On call provider thinks it may be an allergic reaction to the white blood cell booster from this past Friday. Is currently taking an antibiotic. Using Eucerin OTC cream  Chemo regimen:  Doxorubicun, vinblastin, Dacarbazine. Followed by Brentuximab and Pegfilgrastin   The following portions of the chart were reviewed this encounter and updated as appropriate: medications, allergies, medical history  Review of Systems:  No other skin or systemic complaints except as noted in HPI or Assessment and Plan.  Objective  Well appearing patient in no apparent distress; mood and affect are within normal limits.  A focused examination was performed of the following areas: Scalp, Hands, and axilla  Relevant exam findings are noted in the Assessment and Plan.  Right Axilla, scalp and gluteal cleft Inflamed , tender cysts, some draining    Assessment & Plan   FOLLICULITIS Exam: Perifollicular erythematous papules and pustules  Treatment Plan: -Mupirocin topical antibiotic cream  HIDRADENITIS SUPPURATIVA Exam: Open sores   Flared  Hidradenitis Suppurativa is a chronic; persistent; non-curable, but treatable condition due to abnormal inflamed sweat glands in the body folds (axilla, inframammary, groin, medial thighs), causing recurrent painful draining cysts and scarring. It can be associated with severe scarring acne and cysts; also abscesses and scarring of scalp. The goal is  control and prevention of flares, as it is not curable. Scars are permanent and can be thickened. Treatment may include daily use of topical medication and oral antibiotics.  Oral isotretinoin may also be helpful.  For some cases, Humira or Cosentyx (biologic injections) may be prescribed to decrease the inflammatory process and prevent flares.  When indicated, inflamed cysts may also be treated surgically.  Treatment Plan: -Start Doxycycline oral antibiotic. Take one 100mg  tablet twice a day for seven days   Inflamed sebaceous cyst Right Axilla, scalp and gluteal cleft  Intralesional injection - Right Axilla, scalp and gluteal cleft --Mupirocin topical antibiotic cream  Procedure Note Intralesional Injection  Location: scalp, right axillae, gluteal cleft  Informed Consent: Discussed risks (infection, pain, bleeding, bruising, thinning of the skin, loss of skin pigment, lack of resolution, and recurrence of lesion) and benefits of the procedure, as well as the alternatives. Informed consent was obtained. Preparation: The area was prepared a standard fashion.  Anesthesia: none  Procedure Details: An intralesional injection was performed with Kenalog 20 mg/cc. 1 cc in total were injected. NDC #: - Exp: -  Total number of injections: 10  Plan: The patient was instructed on post-op care. Recommend OTC analgesia as needed for pain.   triamcinolone acetonide (KENALOG) 10 MG/ML injection 20 mg - Right Axilla, scalp and gluteal cleft   Wound drainage  Related Procedures Aerobic culture    Return in about 2 weeks (around 12/04/2022) for HS follow up. Can double book at an 8am slot.    Documentation: I have reviewed the above documentation for accuracy and completeness, and I agree with the above.  Langston Reusing, MD  I, Germaine Pomfret, CMA, am acting as scribe for Langston Reusing, MD.

## 2022-11-22 LAB — AEROBIC CULTURE

## 2022-11-25 ENCOUNTER — Other Ambulatory Visit: Payer: Self-pay

## 2022-11-26 ENCOUNTER — Encounter: Payer: Self-pay | Admitting: Dermatology

## 2022-11-27 ENCOUNTER — Telehealth: Payer: Self-pay | Admitting: Physician Assistant

## 2022-11-27 NOTE — Telephone Encounter (Signed)
Hi Lee [name],  Can you please call patient and ask if he started taking the Doxycycline we precribed for flares.  Let him know we can try to move up his appointment and perform a biopsy to confirm these are cysts and not tumors related to his lymphoma and/or more injections for the inflamed cyst if we need to. Thanks!  ~Dr. Onalee Hua

## 2022-11-28 ENCOUNTER — Telehealth: Payer: Self-pay | Admitting: Dermatology

## 2022-11-28 ENCOUNTER — Other Ambulatory Visit: Payer: Self-pay

## 2022-11-28 MED FILL — Fosaprepitant Dimeglumine For IV Infusion 150 MG (Base Eq): INTRAVENOUS | Qty: 5 | Status: AC

## 2022-11-28 MED FILL — Dexamethasone Sodium Phosphate Inj 100 MG/10ML: INTRAMUSCULAR | Qty: 1 | Status: AC

## 2022-11-28 NOTE — Telephone Encounter (Signed)
Hi Michele,  Please call patient and inform him that I do not prescribe pain medications and I recommend him taking OTC Aleve or Advil.  If he needs more advanced pain control he will need to reach out to his Oncologist.  Thanks!   - Dr. Onalee Hua

## 2022-11-28 NOTE — Telephone Encounter (Signed)
Patient called in asking if he could speak with Dr. Onalee Hua or medical team. In regards to prescribing pain medication due to his open sores that are on his head under arms. He mention that from last visit they have spread to other parts of body. Mentioned he is having trouble sleeping due to the pain. Requesting call back [phone].

## 2022-11-29 ENCOUNTER — Inpatient Hospital Stay: Payer: BC Managed Care – PPO

## 2022-11-29 ENCOUNTER — Other Ambulatory Visit: Payer: Self-pay | Admitting: Infectious Diseases

## 2022-11-29 ENCOUNTER — Inpatient Hospital Stay: Payer: BC Managed Care – PPO | Attending: Hematology and Oncology | Admitting: Physician Assistant

## 2022-11-29 ENCOUNTER — Ambulatory Visit: Payer: BC Managed Care – PPO

## 2022-11-29 DIAGNOSIS — K5900 Constipation, unspecified: Secondary | ICD-10-CM | POA: Insufficient documentation

## 2022-11-29 DIAGNOSIS — Z79899 Other long term (current) drug therapy: Secondary | ICD-10-CM | POA: Diagnosis not present

## 2022-11-29 DIAGNOSIS — Z5112 Encounter for antineoplastic immunotherapy: Secondary | ICD-10-CM | POA: Insufficient documentation

## 2022-11-29 DIAGNOSIS — G629 Polyneuropathy, unspecified: Secondary | ICD-10-CM | POA: Insufficient documentation

## 2022-11-29 DIAGNOSIS — C8118 Nodular sclerosis classical Hodgkin lymphoma, lymph nodes of multiple sites: Secondary | ICD-10-CM

## 2022-11-29 DIAGNOSIS — C8174 Other classical Hodgkin lymphoma, lymph nodes of axilla and upper limb: Secondary | ICD-10-CM | POA: Diagnosis not present

## 2022-11-29 DIAGNOSIS — I10 Essential (primary) hypertension: Secondary | ICD-10-CM

## 2022-11-29 DIAGNOSIS — Z9049 Acquired absence of other specified parts of digestive tract: Secondary | ICD-10-CM | POA: Insufficient documentation

## 2022-11-29 DIAGNOSIS — Z5189 Encounter for other specified aftercare: Secondary | ICD-10-CM | POA: Diagnosis not present

## 2022-11-29 DIAGNOSIS — Z5111 Encounter for antineoplastic chemotherapy: Secondary | ICD-10-CM | POA: Insufficient documentation

## 2022-11-29 DIAGNOSIS — L732 Hidradenitis suppurativa: Secondary | ICD-10-CM | POA: Insufficient documentation

## 2022-11-29 DIAGNOSIS — N183 Chronic kidney disease, stage 3 unspecified: Secondary | ICD-10-CM | POA: Diagnosis not present

## 2022-11-29 DIAGNOSIS — Z888 Allergy status to other drugs, medicaments and biological substances status: Secondary | ICD-10-CM | POA: Diagnosis not present

## 2022-11-29 LAB — CMP (CANCER CENTER ONLY)
ALT: 16 U/L (ref 0–44)
AST: 13 U/L — ABNORMAL LOW (ref 15–41)
Albumin: 3.5 g/dL (ref 3.5–5.0)
Alkaline Phosphatase: 146 U/L — ABNORMAL HIGH (ref 38–126)
Anion gap: 4 — ABNORMAL LOW (ref 5–15)
BUN: 19 mg/dL (ref 6–20)
CO2: 28 mmol/L (ref 22–32)
Calcium: 9 mg/dL (ref 8.9–10.3)
Chloride: 106 mmol/L (ref 98–111)
Creatinine: 1.34 mg/dL — ABNORMAL HIGH (ref 0.61–1.24)
GFR, Estimated: >60 mL/min (ref >60)
Glucose, Bld: 101 mg/dL — ABNORMAL HIGH (ref 70–99)
Potassium: 3.8 mmol/L (ref 3.5–5.1)
Sodium: 138 mmol/L (ref 135–145)
Total Bilirubin: 0.3 mg/dL (ref 0.3–1.2)
Total Protein: 8.1 g/dL (ref 6.5–8.1)

## 2022-11-29 LAB — CBC WITH DIFFERENTIAL (CANCER CENTER ONLY)
Abs Immature Granulocytes: 0.28 10*3/uL — ABNORMAL HIGH (ref 0.00–0.07)
Basophils Absolute: 0.1 10*3/uL (ref 0.0–0.1)
Basophils Relative: 1 %
Eosinophils Absolute: 0 10*3/uL (ref 0.0–0.5)
Eosinophils Relative: 0 %
HCT: 34.2 % — ABNORMAL LOW (ref 39.0–52.0)
Hemoglobin: 12.1 g/dL — ABNORMAL LOW (ref 13.0–17.0)
Immature Granulocytes: 2 %
Lymphocytes Relative: 12 %
Lymphs Abs: 2 10*3/uL (ref 0.7–4.0)
MCH: 29.5 pg (ref 26.0–34.0)
MCHC: 35.4 g/dL (ref 30.0–36.0)
MCV: 83.4 fL (ref 80.0–100.0)
Monocytes Absolute: 0.9 10*3/uL (ref 0.1–1.0)
Monocytes Relative: 6 %
Neutro Abs: 12.9 10*3/uL — ABNORMAL HIGH (ref 1.7–7.7)
Neutrophils Relative %: 79 %
Platelet Count: 264 10*3/uL (ref 150–400)
RBC: 4.1 MIL/uL — ABNORMAL LOW (ref 4.22–5.81)
RDW: 15.3 % (ref 11.5–15.5)
WBC Count: 16.1 10*3/uL — ABNORMAL HIGH (ref 4.0–10.5)
nRBC: 0 % (ref 0.0–0.2)

## 2022-11-29 MED ORDER — SODIUM CHLORIDE 0.9 % IV SOLN
150.0000 mg | Freq: Once | INTRAVENOUS | Status: AC
  Administered 2022-11-29: 150 mg via INTRAVENOUS
  Filled 2022-11-29: qty 150

## 2022-11-29 MED ORDER — SODIUM CHLORIDE 0.9 % IV SOLN: Freq: Once | INTRAVENOUS | Status: AC

## 2022-11-29 MED ORDER — VINBLASTINE SULFATE CHEMO INJECTION 1 MG/ML
6.0000 mg/m2 | Freq: Once | INTRAVENOUS | Status: AC
  Administered 2022-11-29: 14 mg via INTRAVENOUS
  Filled 2022-11-29: qty 14

## 2022-11-29 MED ORDER — ACETAMINOPHEN 325 MG PO TABS
650.0000 mg | ORAL_TABLET | Freq: Once | ORAL | Status: AC
  Administered 2022-11-29: 650 mg via ORAL
  Filled 2022-11-29: qty 2

## 2022-11-29 MED ORDER — SODIUM CHLORIDE 0.9 % IV SOLN
10.0000 mg | Freq: Once | INTRAVENOUS | Status: AC
  Administered 2022-11-29: 10 mg via INTRAVENOUS
  Filled 2022-11-29: qty 10

## 2022-11-29 MED ORDER — SODIUM CHLORIDE 0.9 % IV SOLN
120.0000 mg | Freq: Once | INTRAVENOUS | Status: AC
  Administered 2022-11-29: 120 mg via INTRAVENOUS
  Filled 2022-11-29: qty 24

## 2022-11-29 MED ORDER — PALONOSETRON HCL INJECTION 0.25 MG/5ML
0.2500 mg | Freq: Once | INTRAVENOUS | Status: AC
  Administered 2022-11-29: 0.25 mg via INTRAVENOUS
  Filled 2022-11-29: qty 5

## 2022-11-29 MED ORDER — DOXORUBICIN HCL CHEMO IV INJECTION 2 MG/ML
25.0000 mg/m2 | Freq: Once | INTRAVENOUS | Status: AC
  Administered 2022-11-29: 58 mg via INTRAVENOUS
  Filled 2022-11-29: qty 29

## 2022-11-29 MED ORDER — SODIUM CHLORIDE 0.9 % IV SOLN
375.0000 mg/m2 | Freq: Once | INTRAVENOUS | Status: AC
  Administered 2022-11-29: 800 mg via INTRAVENOUS
  Filled 2022-11-29: qty 80

## 2022-11-29 MED ORDER — DIPHENHYDRAMINE HCL 50 MG/ML IJ SOLN
50.0000 mg | Freq: Once | INTRAMUSCULAR | Status: AC
  Administered 2022-11-29: 50 mg via INTRAVENOUS
  Filled 2022-11-29: qty 1

## 2022-11-29 MED ORDER — OXYCODONE HCL 10 MG PO TABS: 10.0000 mg | ORAL_TABLET | ORAL | 0 refills | Status: DC | PRN

## 2022-11-29 NOTE — Progress Notes (Signed)
[facility] [phone]   [phone]  PROGRESS NOTE  Patient Care Team: Shirline Frees, NP as PCP - General (Family Medicine) Sidney Ace, MD as Referring Physician (Allergy) Jaci Standard, MD as Consulting Physician (Hematology and Oncology)  Hematological/Oncological History # Hodgkin's Lymphoma, Stage III 06/08/2022: CT neck showed Bulky adenopathy in the deep right pectoral region, recommend chest CT with contrast. There was splenomegaly on abdominal CT from the summer, consider addition of abdominal CT is well. 07/01/2023: CT chest showed Bulky RIGHT axillary lymph node enlargement, with nodal mass measuring 7 cm in length. Findings suspicious for lymphoproliferative disease 09/13/2022: US guided biopsy of right axillary lymphadenopathy was consistent with classical Hodgkin's lymphoma  10/06/2022: establish care with Dr. Leonides Schanz  10/31/2022: Cycle 1 Day 1 of A-AVD chemotherapy.  11/14/2022: Cycle 1 Day 15 of A-AVD chemotherapy 11/29/2022: Cycle 2 Day 1 of A-AVD chemotherapy   Interval History:  Lee [name] 42 y.o. male with medical history significant for stage III nodular sclerosing Hodgkin's lymphoma who presents for a follow up visit. The patient's last visit was on 11/14/2022. In the interim since the last visit he has started A-AVD chemotherapy.   On exam today Lee [name] reports that he is tolerating his chemotherapy but primary symptom is the diffuse skin lesions. He is under the care of dermatology and completed 7 day course of doxycycline. He continues to apply topical antibiotic cream and uses a zinc shampoo for his scalp. He reports the sores are painful and norco is not providing him relief. He takes supplemental Tylenol 650 mg twice a day. He denies nausea, vomiting or abdominal pain. He does have constipation. He denies easy bruising or signs of bleeding. 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 He denies fevers, chills, sweats, shortness of breath, chest pain or cough. He has no other complaints.  He is willing and able to proceed with treatment today.  A full 10 point ROS is otherwise negative.  MEDICAL HISTORY:  Past Medical History:  Diagnosis Date   CKD (chronic kidney disease) stage 3, GFR 30-59 ml/min (HCC) 02/20/2019   Eczema    Environmental allergies    Essential hypertension    HIV test positive (HCC)    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HCC) 07/24/2016   Shingles     SURGICAL HISTORY: Past Surgical History:  Procedure Laterality Date   CHOLECYSTECTOMY N/A 12/30/2021   Procedure: LAPAROSCOPIC CHOLECYSTECTOMY WITH  CHOLANGIOGRAM;  Surgeon: Darnell Level, MD;  Location: [facility];  Service: General;  Laterality: N/A;   IR IMAGING GUIDED PORT INSERTION  10/18/2022   LAPAROSCOPIC APPENDECTOMY N/A 03/12/2020   Procedure: APPENDECTOMY LAPAROSCOPIC;  Surgeon: Romie Levee, MD;  Location: [facility];  Service: General;  Laterality: N/A;   TYMPANOSTOMY TUBE PLACEMENT     WISDOM TOOTH EXTRACTION     WRIST SURGERY      SOCIAL HISTORY: Social History   Socioeconomic History   Marital status: Single    Spouse name: Not on file   Number of children: Not on file   Years of education: Not on file   Highest education level: Associate degree: academic program  Occupational History   Not on file  Tobacco Use   Smoking status: Never   Smokeless tobacco: Never  Vaping Use   Vaping Use: Never used  Substance and Sexual Activity   Alcohol use: No   Drug use: No   Sexual activity: Not  Currently    Partners: Male    Birth control/protection: Condom    Comment: Given Condoms  Other Topics Concern   Not on file  Social History Narrative   Works as Acupuncturist    -Not married   Social Determinants of Health   Financial Resource Strain: Low Risk  (02/09/2022)   Overall Financial Resource Strain (CARDIA)    Difficulty of Paying Living Expenses:  Not hard at all  Food Insecurity: No Food Insecurity (06/27/2022)   Hunger Vital Sign    Worried About Running Out of Food in the Last Year: Never true    Ran Out of Food in the Last Year: Never true  Transportation Needs: No Transportation Needs (06/27/2022)   PRAPARE - Administrator, Civil Service (Medical): No    Lack of Transportation (Non-Medical): No  Physical Activity: Unknown (02/09/2022)   Exercise Vital Sign    Days of Exercise per Week: 0 days    Minutes of Exercise per Session: Not on file  Stress: Stress Concern Present (02/09/2022)   [employer] of Occupational Health - Occupational Stress Questionnaire    Feeling of Stress : To some extent  Social Connections: Moderately Integrated (02/09/2022)   Social Connection and Isolation Panel [NHANES]    Frequency of Communication with Friends and Family: More than three times a week    Frequency of Social Gatherings with Friends and Family: Not on file    Attends Religious Services: More than 4 times per year    Active Member of [employer] or Organizations: Yes    Attends Engineer, structural: More than 4 times per year    Marital Status: Never married  Catering manager Violence: Not on file    FAMILY HISTORY: Family History  Problem Relation Age of Onset   Hypercalcemia Mother    Hypertension Mother    Hypertension Father     ALLERGIES:  is allergic to gabapentin, lisinopril, and nickel.  MEDICATIONS:  Current Outpatient Medications  Medication Sig Dispense Refill   acetaminophen (TYLENOL) 650 MG CR tablet Take 650 mg by mouth every 8 (eight) hours as needed for pain.     albuterol (PROVENTIL HFA;VENTOLIN HFA) 108 (90 Base) MCG/ACT inhaler Inhale 2 puffs into the lungs every 6 (six) hours as needed for wheezing or shortness of breath. 1 Inhaler 1   amLODipine (NORVASC) 10 MG tablet Take 1 tablet by mouth once daily 30 tablet 0   amoxicillin-clavulanate (AUGMENTIN) 875-125 MG tablet Take 1 tablet by  mouth 2 (two) times daily. 21 tablet 0   azelastine (ASTELIN) 0.1 % nasal spray 1-2 puffs in each nostril twice daily     azelastine (OPTIVAR) 0.05 % ophthalmic solution Apply to eye.     [name] 50-200-25 MG TABS tablet TAKE 1 TABLET DAILY 30 tablet 5   BREO ELLIPTA 200-25 MCG/ACT AEPB Inhale 1 puff into the lungs daily.     EPINEPHrine 0.3 mg/0.3 mL IJ SOAJ injection Inject 0.3 mg into the muscle once.     HYDROcodone-acetaminophen (NORCO/VICODIN) 5-325 MG tablet Take 1 tablet by mouth every 6 (six) hours as needed for moderate pain. 10 tablet 0   lidocaine-prilocaine (EMLA) cream Apply 1 Application topically as needed. 30 g 0   loratadine (CLARITIN) 10 MG tablet Take 10 mg by mouth daily.     montelukast (SINGULAIR) 10 MG tablet Take 10 mg by mouth at bedtime.     mupirocin ointment (BACTROBAN) 2 % Apply 1 Application topically 2 (two) times  daily. Apply topical to the affected area twice a day until clear 22 g 0   ondansetron (ZOFRAN-ODT) 8 MG disintegrating tablet Take 1 tablet (8 mg total) by mouth every 8 (eight) hours as needed for nausea or vomiting. 30 tablet 0   Vitamin D, Ergocalciferol, (DRISDOL) 1.25 MG (50000 UT) CAPS capsule Take 50,000 Units by mouth once a week. Friday     methylPREDNISolone (MEDROL DOSEPAK) 4 MG TBPK tablet Take 6 pills by mouth day 1, 5 on day 2, 4 on day 3, 3 on day 4, 2 on day 5, 1 on day 6 (Patient not taking: Reported on 11/29/2022) 21 tablet 0   ondansetron (ZOFRAN) 8 MG tablet Take 1 tablet (8 mg total) by mouth every 8 (eight) hours as needed. (Patient not taking: Reported on 11/29/2022) 30 tablet 0   prochlorperazine (COMPAZINE) 10 MG tablet Take 1 tablet (10 mg total) by mouth every 6 (six) hours as needed for nausea or vomiting. (Patient not taking: Reported on 11/29/2022) 30 tablet 0   topiramate (TOPAMAX) 50 MG tablet Take 0.5 tablets (25 mg total) by mouth daily. 30 tablet 0   No current facility-administered medications for this visit.    REVIEW OF  SYSTEMS:   Constitutional: ( - ) fevers, ( - )  chills , ( - ) night sweats Eyes: ( - ) blurriness of vision, ( - ) double vision, ( - ) watery eyes Ears, nose, mouth, throat, and face: ( - ) mucositis, ( - ) sore throat Respiratory: ( - ) cough, ( - ) dyspnea, ( - ) wheezes Cardiovascular: ( - ) palpitation, ( - ) chest discomfort, ( - ) lower extremity swelling Gastrointestinal:  ( - ) nausea, ( - ) heartburn, ( - ) change in bowel habits Skin: ( - ) abnormal skin rashes Lymphatics: ( - ) new lymphadenopathy, ( - ) easy bruising Neurological: ( - ) numbness, ( - ) tingling, ( - ) new weaknesses Behavioral/Psych: ( - ) mood change, ( - ) new changes  All other systems were reviewed with the patient and are negative.  PHYSICAL EXAMINATION: ECOG PERFORMANCE STATUS: 1 - Symptomatic but completely ambulatory  Vitals:   11/29/22 1005  BP: (!) 141/78  Pulse: 100  Resp: 17  Temp: 98.1 F (36.7 C)  SpO2: 99%   Filed Weights   11/29/22 1005  Weight: 224 lb 6.4 oz (101.8 kg)    GENERAL: Well-appearing young African-American male, alert, no distress and comfortable SKIN: skin color, texture, turgor are normal, no rashes. Multifocal subcutaneous cysts involving face, right axilla, scalp.  EYES: conjunctiva are pink and non-injected, sclera clear LUNGS: clear to auscultation and percussion with normal breathing effort HEART: regular rate & rhythm and no murmurs and no lower extremity edema Musculoskeletal: no cyanosis of digits and no clubbing  PSYCH: alert & oriented x 3, fluent speech NEURO: no focal motor/sensory deficits  LABORATORY DATA:  I have reviewed the data as listed    Latest Ref Rng & Units 11/29/2022    9:33 AM 11/15/2022    9:13 AM 10/31/2022    7:59 AM  CBC  WBC 4.0 - 10.5 K/uL 16.1  17.5  5.0   Hemoglobin 13.0 - 17.0 g/dL 16.1  09.6  04.5   Hematocrit 39.0 - 52.0 % 34.2  35.6  33.4   Platelets 150 - 400 K/uL 264  265  232        Latest Ref Rng & Units 11/29/2022  9:33 AM 11/15/2022    9:13 AM 10/31/2022    7:59 AM  CMP  Glucose 70 - 99 mg/dL 161  94  89   BUN 6 - 20 mg/dL 19  19  16    Creatinine 0.61 - 1.24 mg/dL 0.96  0.45  4.09   Sodium 135 - 145 mmol/L 138  137  132   Potassium 3.5 - 5.1 mmol/L 3.8  3.8  3.7   Chloride 98 - 111 mmol/L 106  104  103   CO2 22 - 32 mmol/L 28  30  25    Calcium 8.9 - 10.3 mg/dL 9.0  9.0  9.1   Total Protein 6.5 - 8.1 g/dL 8.1  9.5  81.1   Total Bilirubin 0.3 - 1.2 mg/dL 0.3  0.2  0.3   Alkaline Phos 38 - 126 U/L 146  101  83   AST 15 - 41 U/L 13  21  20    ALT 0 - 44 U/L 16  18  9       RADIOGRAPHIC STUDIES: I have personally reviewed the radiological images as listed and agreed with the findings in the report: FDG avid lymph nodes on both sides of the diaphragm, most prominently in the cervical regions and axilla.  Consistent with at least stage III disease. No results found.  ASSESSMENT & PLAN Lee [name] 42 y.o. male with medical history significant for stage III nodular sclerosing Hodgkin's lymphoma who presents for a follow up visit.   After review of the labs, review of the records, and discussion with the patient the patients findings are most consistent with classical Hodgkin's Lymphoma.    # Hodgkin's Lymphoma, Stage III -- PET CT scan staging complete, findings consistent with stage III Hodgkin's lymphoma. --Will plan to proceed with A-AVD chemotherapy. --Echocardiogram complete, shows excellent baseline cardiac function. Plan:  --Labs today show creatinine 1.34, WBC 16.1, MCV 83.4, Hgb 12.5, Plt 264 --OK to proceed with Cycle 2 Day 1 of A-AVD chemotherapy today --Plan for interval PET CT scan after Cycle 2-Day 15 --RTC in 2 weeks for Cycle 2 Day 15 of treatment   #Hidradenitis Suppurativa: --Multifocal subcutenous nodules seen on physical exam --Under the care of dermatology and relates flare ups to GCSF injection --completed 7 day course of doxycycline. Suggest continuing long term until  he finished chemotherapy. --has DHS zinc shampoo and topical mupirocin abx cream.  --Discussed with Dr. Candise Che that he recommends to continue with GCSF injection while on chemotherapy to prevent neutropenia.  --Sent prescription for oxycodone 10 mg q 4-6 hours PRN. Discontinue Norco 5-325 mg as it was ineffective. Okay to supplemental with tylenol as needed, limiting to 2-3gm per day.    #Supportive Care -- chemotherapy education complete -- port placed -- zofran 8mg  q8H PRN and compazine 10mg  PO q6H for nausea -- EMLA cream for port -- no pain medication required at this time.   No orders of the defined types were placed in this encounter.   All questions were answered. The patient knows to call the clinic with any problems, questions or concerns.  I have spent a total of 30 minutes minutes of face-to-face and non-face-to-face time, preparing to see the patient,performing a medically appropriate examination, counseling and educating the patient, ordering medications/tests/procedures,documenting clinical information in the electronic health record, and care coordination.   Georga Kaufmann PA-C Dept of Hematology and Oncology [facility] Phone: [phone]   11/29/2022 10:23 AM

## 2022-11-29 NOTE — Patient Instructions (Signed)
[facility]  Discharge Instructions: Thank you for choosing [facility] to provide your oncology and hematology care.   If you have a lab appointment with the Cancer Center, please go directly to the Cancer Center and check in at the registration area.   Wear comfortable clothing and clothing appropriate for easy access to any Portacath or PICC line.   We strive to give you quality time with your provider. You may need to reschedule your appointment if you arrive late (15 or more minutes).  Arriving late affects you and other patients whose appointments are after yours.  Also, if you miss three or more appointments without notifying the office, you may be dismissed from the clinic at the provider's discretion.      For prescription refill requests, have your pharmacy contact our office and allow 72 hours for refills to be completed.    Today you received the following chemotherapy and/or immunotherapy agents: doxorubicin, vinblastine, dacarbazine, brentuximab vedotin      To help prevent nausea and vomiting after your treatment, we encourage you to take your nausea medication as directed.  BELOW ARE SYMPTOMS THAT SHOULD BE REPORTED IMMEDIATELY: *FEVER GREATER THAN 100.4 F (38 C) OR HIGHER *CHILLS OR SWEATING *NAUSEA AND VOMITING THAT IS NOT CONTROLLED WITH YOUR NAUSEA MEDICATION *UNUSUAL SHORTNESS OF BREATH *UNUSUAL BRUISING OR BLEEDING *URINARY PROBLEMS (pain or burning when urinating, or frequent urination) *BOWEL PROBLEMS (unusual diarrhea, constipation, pain near the anus) TENDERNESS IN MOUTH AND THROAT WITH OR WITHOUT PRESENCE OF ULCERS (sore throat, sores in mouth, or a toothache) UNUSUAL RASH, SWELLING OR PAIN  UNUSUAL VAGINAL DISCHARGE OR ITCHING   Items with * indicate a potential emergency and should be followed up as soon as possible or go to the Emergency Department if any problems should occur.  Please show the  CHEMOTHERAPY ALERT CARD or IMMUNOTHERAPY ALERT CARD at check-in to the Emergency Department and triage nurse.  Should you have questions after your visit or need to cancel or reschedule your appointment, please contact [facility]  Dept: [phone]  and follow the prompts.  Office hours are 8:00 a.m. to 4:30 p.m. Monday - Friday. Please note that voicemails left after 4:00 p.m. may not be returned until the following business day.  We are closed weekends and major holidays. You have access to a nurse at all times for urgent questions. Please call the main number to the clinic Dept: [phone] and follow the prompts.   For any non-urgent questions, you may also contact your provider using MyChart. We now offer e-Visits for anyone 18 and older to request care online for non-urgent symptoms. For details visit [email or web address].   Also download the MyChart app! Go to the app store, search "MyChart", open the app, select [facility], and log in with your MyChart username and password.   

## 2022-12-01 ENCOUNTER — Other Ambulatory Visit: Payer: Self-pay

## 2022-12-01 ENCOUNTER — Inpatient Hospital Stay: Payer: BC Managed Care – PPO

## 2022-12-01 VITALS — BP 120/71 | HR 90 | Temp 98.6°F | Resp 18

## 2022-12-01 DIAGNOSIS — Z5111 Encounter for antineoplastic chemotherapy: Secondary | ICD-10-CM | POA: Diagnosis not present

## 2022-12-01 DIAGNOSIS — Z888 Allergy status to other drugs, medicaments and biological substances status: Secondary | ICD-10-CM | POA: Diagnosis not present

## 2022-12-01 DIAGNOSIS — Z5189 Encounter for other specified aftercare: Secondary | ICD-10-CM | POA: Diagnosis not present

## 2022-12-01 DIAGNOSIS — L732 Hidradenitis suppurativa: Secondary | ICD-10-CM | POA: Diagnosis not present

## 2022-12-01 DIAGNOSIS — Z79899 Other long term (current) drug therapy: Secondary | ICD-10-CM | POA: Diagnosis not present

## 2022-12-01 DIAGNOSIS — C8118 Nodular sclerosis classical Hodgkin lymphoma, lymph nodes of multiple sites: Secondary | ICD-10-CM

## 2022-12-01 DIAGNOSIS — Z5112 Encounter for antineoplastic immunotherapy: Secondary | ICD-10-CM | POA: Diagnosis not present

## 2022-12-01 DIAGNOSIS — C8174 Other classical Hodgkin lymphoma, lymph nodes of axilla and upper limb: Secondary | ICD-10-CM | POA: Diagnosis not present

## 2022-12-01 DIAGNOSIS — K5900 Constipation, unspecified: Secondary | ICD-10-CM | POA: Diagnosis not present

## 2022-12-01 DIAGNOSIS — Z9049 Acquired absence of other specified parts of digestive tract: Secondary | ICD-10-CM | POA: Diagnosis not present

## 2022-12-01 DIAGNOSIS — G629 Polyneuropathy, unspecified: Secondary | ICD-10-CM | POA: Diagnosis not present

## 2022-12-01 DIAGNOSIS — N183 Chronic kidney disease, stage 3 unspecified: Secondary | ICD-10-CM | POA: Diagnosis not present

## 2022-12-01 MED ORDER — PEGFILGRASTIM-CBQV 6 MG/0.6ML ~~LOC~~ SOSY
6.0000 mg | PREFILLED_SYRINGE | Freq: Once | SUBCUTANEOUS | Status: AC
  Administered 2022-12-01: 6 mg via SUBCUTANEOUS
  Filled 2022-12-01: qty 0.6

## 2022-12-04 ENCOUNTER — Ambulatory Visit: Payer: BC Managed Care – PPO | Admitting: Dermatology

## 2022-12-04 ENCOUNTER — Ambulatory Visit (INDEPENDENT_AMBULATORY_CARE_PROVIDER_SITE_OTHER): Payer: BC Managed Care – PPO | Admitting: Dermatology

## 2022-12-04 ENCOUNTER — Encounter: Payer: Self-pay | Admitting: Dermatology

## 2022-12-04 DIAGNOSIS — L739 Follicular disorder, unspecified: Secondary | ICD-10-CM | POA: Diagnosis not present

## 2022-12-04 DIAGNOSIS — L723 Sebaceous cyst: Secondary | ICD-10-CM

## 2022-12-04 DIAGNOSIS — L732 Hidradenitis suppurativa: Secondary | ICD-10-CM

## 2022-12-04 DIAGNOSIS — D489 Neoplasm of uncertain behavior, unspecified: Secondary | ICD-10-CM

## 2022-12-04 MED ORDER — TRIAMCINOLONE ACETONIDE 40 MG/ML IJ SUSP
40.0000 mg | Freq: Once | INTRAMUSCULAR | Status: AC
Start: 2022-12-04 — End: 2022-12-04
  Administered 2022-12-04: 40 mg via INTRADERMAL

## 2022-12-04 NOTE — Patient Instructions (Signed)
Patient Handout: Wound Care for Skin Biopsy Site  Patient Handout: Wound Care for Skin Biopsy Site  Taking Care of Your Skin Biopsy Site  Proper care of the biopsy site is essential for promoting healing and minimizing scarring. This handout provides instructions on how to care for your biopsy site to ensure optimal recovery.  1. Cleaning the Wound:  Clean the biopsy site daily with gentle soap and water. Gently pat the area dry with a clean, soft towel. Avoid harsh scrubbing or rubbing the area, as this can irritate the skin and delay healing.  2. Applying Aquaphor and Bandage:  After cleaning the wound, apply a thin layer of Aquaphor ointment to the biopsy site. Cover the area with a sterile bandage to protect it from dirt, bacteria, and friction. Change the bandage daily or as needed if it becomes soiled or wet.  3. Continued Care for One Week:  Repeat the cleaning, Aquaphor application, and bandaging process daily for one week following the biopsy procedure. Keeping the wound clean and moist during this initial healing period will help prevent infection and promote optimal healing.  4. Massaging Aquaphor into the Area:  ---After one week, discontinue the use of bandages but continue to apply Aquaphor to the biopsy site. ----Gently massage the Aquaphor into the area using circular motions. ---Massaging the skin helps to promote circulation and prevent the formation of scar tissue.   Additional Tips:  Avoid exposing the biopsy site to direct sunlight during the healing process, as this can cause hyperpigmentation or worsen scarring. If you experience any signs of infection, such as increased redness, swelling, warmth, or drainage from the wound, contact your healthcare provider immediately. Follow any additional instructions provided by your healthcare provider for caring for the biopsy site and managing any discomfort. Conclusion:  Taking proper care of your skin biopsy site  is crucial for ensuring optimal healing and minimizing scarring. By following these instructions for cleaning, applying Aquaphor, and massaging the area, you can promote a smooth and successful recovery. If you have any questions or concerns about caring for your biopsy site, don't hesitate to contact your healthcare provider for guidance.      Due to recent changes in healthcare laws, you may see results of your pathology and/or laboratory studies on MyChart before the doctors have had a chance to review them. We understand that in some cases there may be results that are confusing or concerning to you. Please understand that not all results are received at the same time and often the doctors may need to interpret multiple results in order to provide you with the best plan of care or course of treatment. Therefore, we ask that you please give us 2 business days to thoroughly review all your results before contacting the office for clarification. Should we see a critical lab result, you will be contacted sooner.   If You Need Anything After Your Visit  If you have any questions or concerns for your doctor, please call our main line at [phone] If no one answers, please leave a voicemail as directed and we will return your call as soon as possible. Messages left after 4 pm will be answered the following business day.   You may also send us a message via MyChart. We typically respond to MyChart messages within 1-2 business days.  For prescription refills, please ask your pharmacy to contact our office. Our fax number is [phone].  If you have an urgent issue when the clinic is   closed that cannot wait until the next business day, you can page your doctor at the number below.    Please note that while we do our best to be available for urgent issues outside of office hours, we are not available 24/7.   If you have an urgent issue and are unable to reach us, you may choose to seek medical care  at your doctor's office, retail clinic, urgent care center, or emergency room.  If you have a medical emergency, please immediately call 911 or go to the emergency department. In the event of inclement weather, please call our main line at [phone] for an update on the status of any delays or closures.  Dermatology Medication Tips: Please keep the boxes that topical medications come in in order to help keep track of the instructions about where and how to use these. Pharmacies typically print the medication instructions only on the boxes and not directly on the medication tubes.   If your medication is too expensive, please contact our office at [phone] or send us a message through MyChart.   We are unable to tell what your co-pay for medications will be in advance as this is different depending on your insurance coverage. However, we may be able to find a substitute medication at lower cost or fill out paperwork to get insurance to cover a needed medication.   If a prior authorization is required to get your medication covered by your insurance company, please allow us 1-2 business days to complete this process.  Drug prices often vary depending on where the prescription is filled and some pharmacies may offer cheaper prices.  The website www.goodrx.com contains coupons for medications through different pharmacies. The prices here do not account for what the cost may be with help from insurance (it may be cheaper with your insurance), but the website can give you the price if you did not use any insurance.  - You can print the associated coupon and take it with your prescription to the pharmacy.  - You may also stop by our office during regular business hours and pick up a GoodRx coupon card.  - If you need your prescription sent electronically to a different pharmacy, notify our office through  MyChart or by phone at [phone]     

## 2022-12-04 NOTE — Progress Notes (Signed)
   Follow-Up Visit   Subjective  Lee [name] is a 42 y.o. male who presents for the following: Hidradenitis suppurativa   Patient received kenalog injections on 11/20/22. Doxycycline and Bactroban was also given. He had a little relief but it was about two or three days. The right axilla and arm is flared today. On a scale from 1-10, patient says it is overall about a 7 after taking tylenol but a 10 before he took it about an hour ago.   The following portions of the chart were reviewed this encounter and updated as appropriate: medications, allergies, medical history  Review of Systems:  No other skin or systemic complaints except as noted in HPI or Assessment and Plan.  Objective  Well appearing patient in no apparent distress; mood and affect are within normal limits.  A focused examination was performed of the following areas: Right Axillae, face, and scalp  Relevant exam findings are noted in the Assessment and Plan.  Head - Anterior (Face), Right Axilla, Right Upper Arm - Anterior   Left Thigh - Anterior 1cm perifollicular nodule    Assessment & Plan     Inflamed sebaceous cyst (3) Head - Anterior (Face); Right Upper Arm - Anterior; Right Axilla  Related Medications triamcinolone acetonide (KENALOG-40) injection 40 mg   Neoplasm of uncertain behavior Left Thigh - Anterior  Skin / nail biopsy Type of biopsy: punch   Informed consent: discussed and consent obtained   Timeout: patient name, date of birth, surgical site, and procedure verified   Procedure prep:  Patient was prepped and draped in usual sterile fashion Prep type:  Isopropyl alcohol Anesthesia: the lesion was anesthetized in a standard fashion   Anesthetic:  1% lidocaine w/ epinephrine 1-100,000 buffered w/ 8.4% NaHCO3 Punch size:  4 mm Suture size:  4-0 Suture type: nylon   Hemostasis achieved with: suture   Outcome: patient tolerated procedure well   Post-procedure details: sterile  dressing applied   Dressing type: bandage and bacitracin    Specimen 1 - Surgical pathology Differential Diagnosis: rule out hs nodule vs fungal infection vs bacterial infected vs lymphoma cutis   Check Margins: No  Pt's new lesions are most likely new locations of his HS flare.  However, we'd like to rule out possibility of cutaneous lymphoma given his medical hx.     Return in about 2 years (around 12/03/2024) for suture removal.    Documentation: I have reviewed the above documentation for accuracy and completeness, and I agree with the above.  Langston Reusing, DO  I, Germaine Pomfret, CMA, am acting as scribe for [employer], DO.

## 2022-12-09 ENCOUNTER — Other Ambulatory Visit: Payer: Self-pay

## 2022-12-11 ENCOUNTER — Other Ambulatory Visit: Payer: Self-pay | Admitting: Physician Assistant

## 2022-12-11 ENCOUNTER — Encounter: Payer: Self-pay | Admitting: Infectious Diseases

## 2022-12-11 ENCOUNTER — Encounter: Payer: Self-pay | Admitting: Dermatology

## 2022-12-11 ENCOUNTER — Telehealth: Payer: Self-pay | Admitting: *Deleted

## 2022-12-11 ENCOUNTER — Encounter: Payer: Self-pay | Admitting: Physician Assistant

## 2022-12-11 MED ORDER — METHYLPREDNISOLONE 4 MG PO TBPK: ORAL_TABLET | ORAL | 0 refills | Status: DC

## 2022-12-11 MED ORDER — OXYCODONE HCL ER 10 MG PO T12A: 10.0000 mg | EXTENDED_RELEASE_TABLET | Freq: Two times a day (BID) | ORAL | 0 refills | Status: DC

## 2022-12-11 NOTE — Progress Notes (Signed)
Hi Lee [name],  The biopsy confirm that the lesions are inflamed hair follicle cysts and not some other type of tumor.  The report is consistent with the diagnosis of Hidradenitis Suppurativa.  In your can this is all cause by the post chemo medication you take every 2 weeks to replenish your WBCs.  Your treatment plan will continue with oral Doxycyline and Intralesional Kenalog.    Kind regards,  Dr. Onalee Hua

## 2022-12-11 NOTE — Telephone Encounter (Signed)
Received call from patient regarding large cysts that show up after pt gets his Udenyca. He sees a dermatologist for this as well. He has sent pictures via MyChart. He states these cysts are very painful and the oxycodone 10 mg only helps for a couple of hours. Discussed with Lee Kaufmann, PA. She will see him on Wednesday, 12/13/22. But in the meantime will give him a trial fo oxycontin 10 mg q 12hrs, may increase to every 8 hours if needed,  Also a Medrol dose pack was sent in. TCT patient and spoke with him. Advised of Lee [name], [place] plan for long acting Oxycodone and medrol dose pak. She will re-evaluate on 12/13/22. He voiced understanding and will also keep in contact with his dermatologist.

## 2022-12-12 MED FILL — Fosaprepitant Dimeglumine For IV Infusion 150 MG (Base Eq): INTRAVENOUS | Qty: 5 | Status: AC

## 2022-12-12 MED FILL — Dexamethasone Sodium Phosphate Inj 100 MG/10ML: INTRAMUSCULAR | Qty: 1 | Status: AC

## 2022-12-12 NOTE — Telephone Encounter (Signed)
Returned pt's call- his was undetectable 02-2022. Normal CD4.  He has appt scheduled june 5 3:30 pm Asked him to call back [phone]

## 2022-12-13 ENCOUNTER — Inpatient Hospital Stay: Payer: BC Managed Care – PPO

## 2022-12-13 ENCOUNTER — Inpatient Hospital Stay (HOSPITAL_BASED_OUTPATIENT_CLINIC_OR_DEPARTMENT_OTHER): Payer: BC Managed Care – PPO | Admitting: Physician Assistant

## 2022-12-13 VITALS — BP 125/78 | HR 90 | Resp 18

## 2022-12-13 VITALS — BP 139/85 | HR 94 | Temp 97.9°F | Resp 18 | Ht 76.0 in | Wt 232.8 lb

## 2022-12-13 DIAGNOSIS — Z5189 Encounter for other specified aftercare: Secondary | ICD-10-CM | POA: Diagnosis not present

## 2022-12-13 DIAGNOSIS — L732 Hidradenitis suppurativa: Secondary | ICD-10-CM | POA: Diagnosis not present

## 2022-12-13 DIAGNOSIS — C8118 Nodular sclerosis classical Hodgkin lymphoma, lymph nodes of multiple sites: Secondary | ICD-10-CM

## 2022-12-13 DIAGNOSIS — K5900 Constipation, unspecified: Secondary | ICD-10-CM | POA: Diagnosis not present

## 2022-12-13 DIAGNOSIS — Z888 Allergy status to other drugs, medicaments and biological substances status: Secondary | ICD-10-CM | POA: Diagnosis not present

## 2022-12-13 DIAGNOSIS — Z5112 Encounter for antineoplastic immunotherapy: Secondary | ICD-10-CM | POA: Diagnosis not present

## 2022-12-13 DIAGNOSIS — Z79899 Other long term (current) drug therapy: Secondary | ICD-10-CM | POA: Diagnosis not present

## 2022-12-13 DIAGNOSIS — G629 Polyneuropathy, unspecified: Secondary | ICD-10-CM | POA: Diagnosis not present

## 2022-12-13 DIAGNOSIS — Z5111 Encounter for antineoplastic chemotherapy: Secondary | ICD-10-CM

## 2022-12-13 DIAGNOSIS — C8174 Other classical Hodgkin lymphoma, lymph nodes of axilla and upper limb: Secondary | ICD-10-CM | POA: Diagnosis not present

## 2022-12-13 DIAGNOSIS — N183 Chronic kidney disease, stage 3 unspecified: Secondary | ICD-10-CM | POA: Diagnosis not present

## 2022-12-13 DIAGNOSIS — Z9049 Acquired absence of other specified parts of digestive tract: Secondary | ICD-10-CM | POA: Diagnosis not present

## 2022-12-13 LAB — CBC WITH DIFFERENTIAL (CANCER CENTER ONLY)
Abs Immature Granulocytes: 1.58 10*3/uL — ABNORMAL HIGH (ref 0.00–0.07)
Basophils Absolute: 0.1 10*3/uL (ref 0.0–0.1)
Basophils Relative: 0 %
Eosinophils Absolute: 0 10*3/uL (ref 0.0–0.5)
Eosinophils Relative: 0 %
HCT: 34 % — ABNORMAL LOW (ref 39.0–52.0)
Hemoglobin: 11.5 g/dL — ABNORMAL LOW (ref 13.0–17.0)
Immature Granulocytes: 7 %
Lymphocytes Relative: 9 %
Lymphs Abs: 2.1 10*3/uL (ref 0.7–4.0)
MCH: 29.3 pg (ref 26.0–34.0)
MCHC: 33.8 g/dL (ref 30.0–36.0)
MCV: 86.5 fL (ref 80.0–100.0)
Monocytes Absolute: 1 10*3/uL (ref 0.1–1.0)
Monocytes Relative: 4 %
Neutro Abs: 18.3 10*3/uL — ABNORMAL HIGH (ref 1.7–7.7)
Neutrophils Relative %: 80 %
Platelet Count: 287 10*3/uL (ref 150–400)
RBC: 3.93 MIL/uL — ABNORMAL LOW (ref 4.22–5.81)
RDW: 17.2 % — ABNORMAL HIGH (ref 11.5–15.5)
WBC Count: 23 10*3/uL — ABNORMAL HIGH (ref 4.0–10.5)
nRBC: 0 % (ref 0.0–0.2)

## 2022-12-13 LAB — CMP (CANCER CENTER ONLY)
ALT: 18 U/L (ref 0–44)
AST: 16 U/L (ref 15–41)
Albumin: 3.8 g/dL (ref 3.5–5.0)
Alkaline Phosphatase: 132 U/L — ABNORMAL HIGH (ref 38–126)
Anion gap: 7 (ref 5–15)
BUN: 33 mg/dL — ABNORMAL HIGH (ref 6–20)
CO2: 23 mmol/L (ref 22–32)
Calcium: 8.9 mg/dL (ref 8.9–10.3)
Chloride: 108 mmol/L (ref 98–111)
Creatinine: 1.32 mg/dL — ABNORMAL HIGH (ref 0.61–1.24)
GFR, Estimated: >60 mL/min (ref >60)
Glucose, Bld: 123 mg/dL — ABNORMAL HIGH (ref 70–99)
Potassium: 3.8 mmol/L (ref 3.5–5.1)
Sodium: 138 mmol/L (ref 135–145)
Total Bilirubin: 0.3 mg/dL (ref 0.3–1.2)
Total Protein: 7.8 g/dL (ref 6.5–8.1)

## 2022-12-13 MED ORDER — HEPARIN SOD (PORK) LOCK FLUSH 100 UNIT/ML IV SOLN
500.0000 [IU] | Freq: Once | INTRAVENOUS | Status: AC | PRN
  Administered 2022-12-13: 500 [IU]

## 2022-12-13 MED ORDER — DIPHENHYDRAMINE HCL 50 MG/ML IJ SOLN
50.0000 mg | Freq: Once | INTRAMUSCULAR | Status: AC
  Administered 2022-12-13: 50 mg via INTRAVENOUS
  Filled 2022-12-13: qty 1

## 2022-12-13 MED ORDER — VINBLASTINE SULFATE CHEMO INJECTION 1 MG/ML
6.0000 mg/m2 | Freq: Once | INTRAVENOUS | Status: AC
  Administered 2022-12-13: 14 mg via INTRAVENOUS
  Filled 2022-12-13: qty 14

## 2022-12-13 MED ORDER — SODIUM CHLORIDE 0.9% FLUSH
10.0000 mL | INTRAVENOUS | Status: DC | PRN
  Administered 2022-12-13: 10 mL

## 2022-12-13 MED ORDER — DOXORUBICIN HCL CHEMO IV INJECTION 2 MG/ML
25.0000 mg/m2 | Freq: Once | INTRAVENOUS | Status: AC
  Administered 2022-12-13: 58 mg via INTRAVENOUS
  Filled 2022-12-13: qty 29

## 2022-12-13 MED ORDER — ACETAMINOPHEN 325 MG PO TABS
650.0000 mg | ORAL_TABLET | Freq: Once | ORAL | Status: AC
  Administered 2022-12-13: 650 mg via ORAL
  Filled 2022-12-13: qty 2

## 2022-12-13 MED ORDER — SODIUM CHLORIDE 0.9 % IV SOLN: Freq: Once | INTRAVENOUS | Status: AC

## 2022-12-13 MED ORDER — PALONOSETRON HCL INJECTION 0.25 MG/5ML
0.2500 mg | Freq: Once | INTRAVENOUS | Status: AC
  Administered 2022-12-13: 0.25 mg via INTRAVENOUS
  Filled 2022-12-13: qty 5

## 2022-12-13 MED ORDER — SODIUM CHLORIDE 0.9 % IV SOLN
375.0000 mg/m2 | Freq: Once | INTRAVENOUS | Status: AC
  Administered 2022-12-13: 800 mg via INTRAVENOUS
  Filled 2022-12-13: qty 80

## 2022-12-13 MED ORDER — SODIUM CHLORIDE 0.9 % IV SOLN
10.0000 mg | Freq: Once | INTRAVENOUS | Status: AC
  Administered 2022-12-13: 10 mg via INTRAVENOUS
  Filled 2022-12-13: qty 10

## 2022-12-13 MED ORDER — SODIUM CHLORIDE 0.9 % IV SOLN
150.0000 mg | Freq: Once | INTRAVENOUS | Status: AC
  Administered 2022-12-13: 150 mg via INTRAVENOUS
  Filled 2022-12-13: qty 150

## 2022-12-13 NOTE — Progress Notes (Signed)
[facility] [phone]   Fax:([phone]  PROGRESS NOTE  Patient Care Team: Shirline Frees, NP as PCP - General (Family Medicine) Sidney Ace, MD as Referring Physician (Allergy) Jaci Standard, MD as Consulting Physician (Hematology and Oncology)  Hematological/Oncological History # Hodgkin's Lymphoma, Stage III 06/08/2022: CT neck showed Bulky adenopathy in the deep right pectoral region, recommend chest CT with contrast. There was splenomegaly on abdominal CT from the summer, consider addition of abdominal CT is well. 07/01/2023: CT chest showed Bulky RIGHT axillary lymph node enlargement, with nodal mass measuring 7 cm in length. Findings suspicious for lymphoproliferative disease 09/13/2022: US guided biopsy of right axillary lymphadenopathy was consistent with classical Hodgkin's lymphoma  10/06/2022: establish care with Dr. Leonides Schanz  10/31/2022: Cycle 1 Day 1 of A-AVD chemotherapy.  11/14/2022: Cycle 1 Day 15 of A-AVD chemotherapy 11/29/2022: Cycle 2 Day 1 of A-AVD chemotherapy 12/13/2022: Cycle 2 Day 15 of A-AVD chemotherapy (holding brentuximab and Udenyca due to worsening skin lesions from hidradenitis suppurativa)   Interval History:  Lee [name] April 42 y.o. male with medical history significant for stage III nodular sclerosing Hodgkin's lymphoma who presents for a follow up visit. The patient's last visit was on 11/29/2022. In the interim since the last visit he completed Cycle 2, Day 15 of A-AVD chemotherapy. He is unaccompanied for this visit.   On exam today Lee [name] reports his skin lesions are getting bigger and more painful after each Udenyca injection. He tried the oxycontin and oxycodone pain medications which only give him relief for 2-3 hours.  He reports the most improvement of pain with the medrol dose pack. He reports the sores do drain and he is compliant with the doxycycline and intralesional kenalog as prescribed by his  dermatologist. He reports his energy and appetite are stable. He denies nausea, vomiting or abdominal pain. He has new onset constipation with a bowel movement every 3-4 days. He denies easy bruising or signs of bleeding. He denies fevers, chills, sweats, shortness of breath, chest pain or cough. He has no other complaints.  He is willing and able to proceed with treatment today.  A full 10 point ROS is otherwise negative.  MEDICAL HISTORY:  Past Medical History:  Diagnosis Date   CKD (chronic kidney disease) stage 3, GFR 30-59 ml/min (HCC) 02/20/2019   Eczema    Environmental allergies    Essential hypertension    HIV test positive (HCC)    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HCC) 07/24/2016   Shingles     SURGICAL HISTORY: Past Surgical History:  Procedure Laterality Date   CHOLECYSTECTOMY N/A 12/30/2021   Procedure: LAPAROSCOPIC CHOLECYSTECTOMY WITH  CHOLANGIOGRAM;  Surgeon: Darnell Level, MD;  Location: [facility];  Service: General;  Laterality: N/A;   IR IMAGING GUIDED PORT INSERTION  10/18/2022   LAPAROSCOPIC APPENDECTOMY N/A 03/12/2020   Procedure: APPENDECTOMY LAPAROSCOPIC;  Surgeon: Romie Levee, MD;  Location: [facility];  Service: General;  Laterality: N/A;   TYMPANOSTOMY TUBE PLACEMENT     WISDOM TOOTH EXTRACTION     WRIST SURGERY      SOCIAL HISTORY: Social History   Socioeconomic History   Marital status: Single    Spouse name: Not on file   Number of children: Not on file   Years of education: Not on file   Highest education level: Associate degree: academic program  Occupational History   Not on file  Tobacco Use   Smoking status: Never   Smokeless tobacco: Never  Vaping Use   Vaping Use: Never used  Substance and Sexual Activity   Alcohol use: No   Drug use: No   Sexual activity: Not Currently    Partners: Male    Birth control/protection: Condom    Comment: Given Condoms  Other Topics Concern   Not on file  Social History Narrative   Works as Acupuncturist     -Not married   Social Determinants of Health   Financial Resource Strain: Low Risk  (02/09/2022)   Overall Financial Resource Strain (CARDIA)    Difficulty of Paying Living Expenses: Not hard at all  Food Insecurity: No Food Insecurity (06/27/2022)   Hunger Vital Sign    Worried About Running Out of Food in the Last Year: Never true    Ran Out of Food in the Last Year: Never true  Transportation Needs: No Transportation Needs (06/27/2022)   PRAPARE - Administrator, Civil Service (Medical): No    Lack of Transportation (Non-Medical): No  Physical Activity: Unknown (02/09/2022)   Exercise Vital Sign    Days of Exercise per Week: 0 days    Minutes of Exercise per Session: Not on file  Stress: Stress Concern Present (02/09/2022)   [employer] of Occupational Health - Occupational Stress Questionnaire    Feeling of Stress : To some extent  Social Connections: Moderately Integrated (02/09/2022)   Social Connection and Isolation Panel [NHANES]    Frequency of Communication with Friends and Family: More than three times a week    Frequency of Social Gatherings with Friends and Family: Not on file    Attends Religious Services: More than 4 times per year    Active Member of [employer] or Organizations: Yes    Attends Engineer, structural: More than 4 times per year    Marital Status: Never married  Catering manager Violence: Not on file    FAMILY HISTORY: Family History  Problem Relation Age of Onset   Hypercalcemia Mother    Hypertension Mother    Hypertension Father     ALLERGIES:  is allergic to gabapentin, lisinopril, and nickel.  MEDICATIONS:  Current Outpatient Medications  Medication Sig Dispense Refill   acetaminophen (TYLENOL) 650 MG CR tablet Take 650 mg by mouth every 8 (eight) hours as needed for pain.     albuterol (PROVENTIL HFA;VENTOLIN HFA) 108 (90 Base) MCG/ACT inhaler Inhale 2 puffs into the lungs every 6 (six) hours as needed for wheezing or  shortness of breath. 1 Inhaler 1   amLODipine (NORVASC) 10 MG tablet Take 1 tablet by mouth once daily 30 tablet 0   amoxicillin-clavulanate (AUGMENTIN) 875-125 MG tablet Take 1 tablet by mouth 2 (two) times daily. 21 tablet 0   azelastine (ASTELIN) 0.1 % nasal spray 1-2 puffs in each nostril twice daily     azelastine (OPTIVAR) 0.05 % ophthalmic solution Apply to eye.     [name] 50-200-25 MG TABS tablet TAKE 1 TABLET DAILY 30 tablet 5   BREO ELLIPTA 200-25 MCG/ACT AEPB Inhale 1 puff into the lungs daily.     EPINEPHrine 0.3 mg/0.3 mL IJ SOAJ injection Inject 0.3 mg into the muscle once.     lidocaine-prilocaine (EMLA) cream Apply 1 Application topically as needed. 30 g 0   loratadine (CLARITIN) 10 MG tablet Take 10 mg by mouth daily.     methylPREDNISolone (MEDROL DOSEPAK) 4 MG TBPK tablet Take 6 pills by mouth day 1, 5 on day 2, 4 on day 3, 3 on  day 4, 2 on day 5, 1 on day 6 21 tablet 0   montelukast (SINGULAIR) 10 MG tablet Take 10 mg by mouth at bedtime.     mupirocin ointment (BACTROBAN) 2 % Apply 1 Application topically 2 (two) times daily. Apply topical to the affected area twice a day until clear 22 g 0   ondansetron (ZOFRAN) 8 MG tablet Take 1 tablet (8 mg total) by mouth every 8 (eight) hours as needed. 30 tablet 0   ondansetron (ZOFRAN-ODT) 8 MG disintegrating tablet Take 1 tablet (8 mg total) by mouth every 8 (eight) hours as needed for nausea or vomiting. 30 tablet 0   oxyCODONE (OXYCONTIN) 10 mg 12 hr tablet Take 1 tablet (10 mg total) by mouth every 12 (twelve) hours. 14 tablet 0   oxyCODONE 10 MG TABS Take 1 tablet (10 mg total) by mouth every 4 (four) hours as needed for severe pain. 90 tablet 0   prochlorperazine (COMPAZINE) 10 MG tablet Take 1 tablet (10 mg total) by mouth every 6 (six) hours as needed for nausea or vomiting. 30 tablet 0   Vitamin D, Ergocalciferol, (DRISDOL) 1.25 MG (50000 UT) CAPS capsule Take 50,000 Units by mouth once a week. Friday     topiramate  (TOPAMAX) 50 MG tablet Take 0.5 tablets (25 mg total) by mouth daily. (Patient not taking: Reported on 12/13/2022) 30 tablet 0   No current facility-administered medications for this visit.   Facility-Administered Medications Ordered in Other Visits  Medication Dose Route Frequency Provider Last Rate Last Admin   dacarbazine (DTIC) 800 mg in sodium chloride 0.9 % 250 mL chemo infusion  375 mg/m2 (Treatment Plan Recorded) Intravenous Once Jaci Standard, MD       DOXOrubicin (ADRIAMYCIN) chemo injection 58 mg  25 mg/m2 (Treatment Plan Recorded) Intravenous Once Jaci Standard, MD   58 mg at 12/13/22 1306   heparin lock flush 100 unit/mL  500 Units Intracatheter Once PRN Jaci Standard, MD       sodium chloride flush (NS) 0.9 % injection 10 mL  10 mL Intracatheter PRN Jaci Standard, MD       vinBLAStine (VELBAN) 14 mg in sodium chloride 0.9 % 50 mL chemo infusion  6 mg/m2 (Treatment Plan Recorded) Intravenous Once Jaci Standard, MD        REVIEW OF SYSTEMS:   Constitutional: ( - ) fevers, ( - )  chills , ( - ) night sweats Eyes: ( - ) blurriness of vision, ( - ) double vision, ( - ) watery eyes Ears, nose, mouth, throat, and face: ( - ) mucositis, ( - ) sore throat Respiratory: ( - ) cough, ( - ) dyspnea, ( - ) wheezes Cardiovascular: ( - ) palpitation, ( - ) chest discomfort, ( - ) lower extremity swelling Gastrointestinal:  ( - ) nausea, ( - ) heartburn, ( - ) change in bowel habits Skin: ( - ) abnormal skin rashes Lymphatics: ( - ) new lymphadenopathy, ( - ) easy bruising Neurological: ( - ) numbness, ( - ) tingling, ( - ) new weaknesses Behavioral/Psych: ( - ) mood change, ( - ) new changes  All other systems were reviewed with the patient and are negative.  PHYSICAL EXAMINATION: ECOG PERFORMANCE STATUS: 1 - Symptomatic but completely ambulatory  Vitals:   12/13/22 1029  BP: 139/85  Pulse: 94  Resp: 18  Temp: 97.9 F (36.6 C)  SpO2: 100%   Filed  Weights    12/13/22 1029  Weight: 232 lb 12.8 oz (105.6 kg)    GENERAL: Well-appearing young African-American male, alert, no distress and comfortable SKIN: skin color, texture, turgor are normal, no rashes. Multifocal subcutaneous cysts involving face, right axilla, scalp.  EYES: conjunctiva are pink and non-injected, sclera clear LUNGS: clear to auscultation and percussion with normal breathing effort HEART: regular rate & rhythm and no murmurs and no lower extremity edema Musculoskeletal: no cyanosis of digits and no clubbing  PSYCH: alert & oriented x 3, fluent speech NEURO: no focal motor/sensory deficits  LABORATORY DATA:  I have reviewed the data as listed    Latest Ref Rng & Units 12/13/2022    9:54 AM 11/29/2022    9:33 AM 11/15/2022    9:13 AM  CBC  WBC 4.0 - 10.5 K/uL 23.0  16.1  17.5   Hemoglobin 13.0 - 17.0 g/dL 16.1  09.6  04.5   Hematocrit 39.0 - 52.0 % 34.0  34.2  35.6   Platelets 150 - 400 K/uL 287  264  265        Latest Ref Rng & Units 12/13/2022    9:54 AM 11/29/2022    9:33 AM 11/15/2022    9:13 AM  CMP  Glucose 70 - 99 mg/dL 409  811  94   BUN 6 - 20 mg/dL 33  19  19   Creatinine 0.61 - 1.24 mg/dL 9.14  7.82  9.56   Sodium 135 - 145 mmol/L 138  138  137   Potassium 3.5 - 5.1 mmol/L 3.8  3.8  3.8   Chloride 98 - 111 mmol/L 108  106  104   CO2 22 - 32 mmol/L 23  28  30    Calcium 8.9 - 10.3 mg/dL 8.9  9.0  9.0   Total Protein 6.5 - 8.1 g/dL 7.8  8.1  9.5   Total Bilirubin 0.3 - 1.2 mg/dL 0.3  0.3  0.2   Alkaline Phos 38 - 126 U/L 132  146  101   AST 15 - 41 U/L 16  13  21    ALT 0 - 44 U/L 18  16  18       RADIOGRAPHIC STUDIES: I have personally reviewed the radiological images as listed and agreed with the findings in the report: FDG avid lymph nodes on both sides of the diaphragm, most prominently in the cervical regions and axilla.  Consistent with at least stage III disease. No results found.  ASSESSMENT & PLAN Lee [name] 42 y.o. male with medical  history significant for stage III nodular sclerosing Hodgkin's lymphoma who presents for a follow up visit.   After review of the labs, review of the records, and discussion with the patient the patients findings are most consistent with classical Hodgkin's Lymphoma.    # Hodgkin's Lymphoma, Stage III -- PET CT scan staging complete, findings consistent with stage III Hodgkin's lymphoma. --Will plan to proceed with A-AVD chemotherapy. --Echocardiogram complete, shows excellent baseline cardiac function. Plan:  --Labs today show creatinine 1.34, WBC 16.1, MCV 83.4, Hgb 12.5, Plt 264 --OK to proceed with Cycle 2 Day 15 of A-AVD chemotherapy today --Due to worsening skin lesions that is likely flaring up from Udenyca injection, we will hold Brentuximab and Udenyca injection for this treatment.  --Mid treatment PET CT scan is scheduled for 12/19/2022.  --RTC in 2 weeks for Cycle 3 Day 1 of treatment. Dr. Leonides Schanz will determine if we need to switch chemotherapy regimens  to ABVD to avoid GCSF injections.   #Hidradenitis Suppurativa: --Multifocal subcutenous nodules seen on physical exam --Under the care of dermatology and relates flare ups to GCSF injection --Continue with oral doxycycline and intralesional kenalog --Continue with prescribed pain regimen as needed.  #Constipation: --Likely secondary to opoid pain medication --Recommend to try OTC stool softeners such as senna or miralax.    #Supportive Care -- chemotherapy education complete -- port placed -- zofran 8mg  q8H PRN and compazine 10mg  PO q6H for nausea -- EMLA cream for port -- no pain medication required at this time.   No orders of the defined types were placed in this encounter.   All questions were answered. The patient knows to call the clinic with any problems, questions or concerns.  I have spent a total of 30 minutes minutes of face-to-face and non-face-to-face time, preparing to see the patient,performing a medically  appropriate examination, counseling and educating the patient, ordering medications/tests/procedures,documenting clinical information in the electronic health record, and care coordination.   Georga Kaufmann PA-C Dept of Hematology and Oncology [facility] Phone: [phone]   12/13/2022 1:10 PM

## 2022-12-13 NOTE — Patient Instructions (Signed)
[place] [facility]  Discharge Instructions: Thank you for choosing [facility] to provide your oncology and hematology care.   If you have a lab appointment with the Cancer Center, please go directly to the Cancer Center and check in at the registration area.   Wear comfortable clothing and clothing appropriate for easy access to any Portacath or PICC line.   We strive to give you quality time with your provider. You may need to reschedule your appointment if you arrive late (15 or more minutes).  Arriving late affects you and other patients whose appointments are after yours.  Also, if you miss three or more appointments without notifying the office, you may be dismissed from the clinic at the provider's discretion.      For prescription refill requests, have your pharmacy contact our office and allow 72 hours for refills to be completed.    Today you received the following chemotherapy and/or immunotherapy agents: Adriamycin/Vincristine/DTIC      To help prevent nausea and vomiting after your treatment, we encourage you to take your nausea medication as directed.  BELOW ARE SYMPTOMS THAT SHOULD BE REPORTED IMMEDIATELY: *FEVER GREATER THAN 100.4 F (38 C) OR HIGHER *CHILLS OR SWEATING *NAUSEA AND VOMITING THAT IS NOT CONTROLLED WITH YOUR NAUSEA MEDICATION *UNUSUAL SHORTNESS OF BREATH *UNUSUAL BRUISING OR BLEEDING *URINARY PROBLEMS (pain or burning when urinating, or frequent urination) *BOWEL PROBLEMS (unusual diarrhea, constipation, pain near the anus) TENDERNESS IN MOUTH AND THROAT WITH OR WITHOUT PRESENCE OF ULCERS (sore throat, sores in mouth, or a toothache) UNUSUAL RASH, SWELLING OR PAIN  UNUSUAL VAGINAL DISCHARGE OR ITCHING   Items with * indicate a potential emergency and should be followed up as soon as possible or go to the Emergency Department if any problems should occur.  Please show the CHEMOTHERAPY ALERT CARD or IMMUNOTHERAPY  ALERT CARD at check-in to the Emergency Department and triage nurse.  Should you have questions after your visit or need to cancel or reschedule your appointment, please contact [place] [facility]  Dept: [phone]  and follow the prompts.  Office hours are 8:00 a.m. to 4:30 p.m. Monday - Friday. Please note that voicemails left after 4:00 p.m. may not be returned until the following business day.  We are closed weekends and major holidays. You have access to a nurse at all times for urgent questions. Please call the main number to the clinic Dept: [phone] and follow the prompts.   For any non-urgent questions, you may also contact your provider using MyChart. We now offer e-Visits for anyone 22 and older to request care online for non-urgent symptoms. For details visit [email or web address].   Also download the MyChart app! Go to the app store, search "MyChart", open the app, select [place], and log in with your MyChart username and password.

## 2022-12-15 ENCOUNTER — Inpatient Hospital Stay: Payer: BC Managed Care – PPO

## 2022-12-19 ENCOUNTER — Encounter (HOSPITAL_COMMUNITY)
Admission: RE | Admit: 2022-12-19 | Discharge: 2022-12-19 | Disposition: A | Discharge status: Home / Self Care | Payer: BC Managed Care – PPO | Source: Ambulatory Visit | Attending: Physician Assistant | Admitting: Physician Assistant

## 2022-12-19 DIAGNOSIS — C8118 Nodular sclerosis classical Hodgkin lymphoma, lymph nodes of multiple sites: Secondary | ICD-10-CM | POA: Diagnosis not present

## 2022-12-19 DIAGNOSIS — C8190 Hodgkin lymphoma, unspecified, unspecified site: Secondary | ICD-10-CM | POA: Diagnosis not present

## 2022-12-19 LAB — GLUCOSE, CAPILLARY: Glucose-Capillary: 98 mg/dL (ref 70–99)

## 2022-12-19 MED ORDER — FLUDEOXYGLUCOSE F - 18 (FDG) INJECTION
11.6000 | Freq: Once | INTRAVENOUS | Status: AC | PRN
  Administered 2022-12-19: 11.54 via INTRAVENOUS

## 2022-12-20 ENCOUNTER — Encounter: Payer: Self-pay | Admitting: Dermatology

## 2022-12-20 ENCOUNTER — Ambulatory Visit (INDEPENDENT_AMBULATORY_CARE_PROVIDER_SITE_OTHER): Payer: BC Managed Care – PPO | Admitting: Dermatology

## 2022-12-20 DIAGNOSIS — L723 Sebaceous cyst: Secondary | ICD-10-CM

## 2022-12-20 DIAGNOSIS — L732 Hidradenitis suppurativa: Secondary | ICD-10-CM | POA: Diagnosis not present

## 2022-12-20 MED ORDER — TRIAMCINOLONE ACETONIDE 10 MG/ML IJ SUSP
20.0000 mg | Freq: Once | INTRAMUSCULAR | Status: AC
Start: 2022-12-20 — End: 2022-12-20
  Administered 2022-12-20: 20 mg via INTRADERMAL

## 2022-12-20 MED ORDER — DOXYCYCLINE HYCLATE 100 MG PO TABS
100.0000 mg | ORAL_TABLET | Freq: Every day | ORAL | 0 refills | Status: DC
Start: 2022-12-20 — End: 2023-01-10

## 2022-12-20 MED ORDER — DOXYCYCLINE HYCLATE 100 MG PO TABS
100.0000 mg | ORAL_TABLET | Freq: Every day | ORAL | 3 refills | Status: DC
Start: 2022-12-20 — End: 2022-12-20

## 2022-12-20 MED ORDER — MUPIROCIN 2 % EX OINT
1.0000 | TOPICAL_OINTMENT | Freq: Two times a day (BID) | CUTANEOUS | 3 refills | Status: DC
Start: 2022-12-20 — End: 2024-06-08

## 2022-12-20 NOTE — Patient Instructions (Addendum)
-We will send in Doxycycline 100mg  tablets, Take once daily as soon as you experience a flare for 1 week. TAKE WITH FOOD AND PLENTY OF WATER -Refills for Mupirocin Ointment 2%, Apply 1 Application topically 2 (two) times daily. Apply topical to the affected area twice a day until clear  Due to recent changes in healthcare laws, you may see results of your pathology and/or laboratory studies on MyChart before the doctors have had a chance to review them. We understand that in some cases there may be results that are confusing or concerning to you. Please understand that not all results are received at the same time and often the doctors may need to interpret multiple results in order to provide you with the best plan of care or course of treatment. Therefore, we ask that you please give [place] 2 business days to thoroughly review all your results before contacting the office for clarification. Should we see a critical lab result, you will be contacted sooner.   If You Need Anything After Your Visit  If you have any questions or concerns for your doctor, please call our main line at [phone] If no one answers, please leave a voicemail as directed and we will return your call as soon as possible. Messages left after 4 pm will be answered the following business day.   You may also send [place] a message via MyChart. We typically respond to MyChart messages within 1-2 business days.  For prescription refills, please ask your pharmacy to contact our office. Our fax number is [phone].  If you have an urgent issue when the clinic is closed that cannot wait until the next business day, you can page your doctor at the number below.    Please note that while we do our best to be available for urgent issues outside of office hours, we are not available 24/7.   If you have an urgent issue and are unable to reach [place], you may choose to seek medical care at your doctor's office, retail clinic, urgent care center,  or emergency room.  If you have a medical emergency, please immediately call 911 or go to the emergency department. In the event of inclement weather, please call our main line at [phone] for an update on the status of any delays or closures.  Dermatology Medication Tips: Please keep the boxes that topical medications come in in order to help keep track of the instructions about where and how to use these. Pharmacies typically print the medication instructions only on the boxes and not directly on the medication tubes.   If your medication is too expensive, please contact our office at [phone] or send [place] a message through MyChart.   We are unable to tell what your co-pay for medications will be in advance as this is different depending on your insurance coverage. However, we may be able to find a substitute medication at lower cost or fill out paperwork to get insurance to cover a needed medication.   If a prior authorization is required to get your medication covered by your insurance company, please allow [place] 1-2 business days to complete this process.  Drug prices often vary depending on where the prescription is filled and some pharmacies may offer cheaper prices.  The website www.goodrx.com contains coupons for medications through different pharmacies. The prices here do not account for what the cost may be with help from insurance (it may be cheaper with your insurance), but the website can give you  the price if you did not use any insurance.  - You can print the associated coupon and take it with your prescription to the pharmacy.  - You may also stop by our office during regular business hours and pick up a GoodRx coupon card.  - If you need your prescription sent electronically to a different pharmacy, notify our office through [facility] or by phone at [phone]

## 2022-12-20 NOTE — Progress Notes (Signed)
   Follow-Up Visit   Subjective  Lee [name] is a 42 y.o. male who presents for the following: Hidradenitis suppurativa   Patient present today for follow up visit for suture removal s/p L Thigh (Anterior) bx. Patient was last evaluated on 12/04/22. Patient reports sxs are Better but not at goal. He reports no pain at the thigh incision, He reports some soreness at the RUE sebaceous cyst but better since his previous visit. Patient reports he has not taken the recommended oral Doxycycline but he did use the Mupirocin ointment but has now ran out. Patient reports no other medication changes.    The following portions of the chart were reviewed this encounter and updated as appropriate: medications, allergies, medical history  Review of Systems:  No other skin or systemic complaints except as noted in HPI or Assessment and Plan.  Objective  Well appearing patient in no apparent distress; mood and affect are within normal limits.  A focused examination was performed of the following areas:   Relevant exam findings are noted in the Assessment and Plan.  Mid Submental Area, Right Axilla, Right Submandibular Area Kenalog 20mg  injection    Assessment & Plan   Hidradenitis suppurativa  Exam: Subcutaneous nodules Right Axilla, Right Mid Submental, Right Submandibular  Treatment Plan: - Currently stable because he didn't have the WBC booster treatment this week, he is doing well. - Kenalog 20mg  Intradermal injections administered while in office -We will send in Doxycycline 100 mg tablets, Take once daily as soon as you experience a flare for 1 week. TAKE WITH FOOD AND PLENTY OF WATER -Refills for Mupirocin Ointment 2%, Apply 1 Application topically 2 (two) times daily. Apply topical to the affected area twice a day until clear   Inflamed sebaceous cyst (3) Right Axilla; Right Submandibular Area; Mid Submental Area  Procedure Note Intralesional Injection  Location: Right  Axilla, Right Mid Submental, Right Submandibular  Informed Consent: Discussed risks (infection, pain, bleeding, bruising, thinning of the skin, loss of skin pigment, lack of resolution, and recurrence of lesion) and benefits of the procedure, as well as the alternatives. Informed consent was obtained. Preparation: The area was prepared a standard fashion.  Anesthesia: None  Procedure Details: An intralesional injection was performed with Kenalog 20 mg/cc.  cc in total were injected. [facility] #: [id number] Exp: Dec 2025  Total number of injections: 9  Plan: The patient was instructed on post-op care. Recommend OTC analgesia as needed for pain.   Related Medications doxycycline (VIBRA-TABS) 100 MG tablet Take 1 tablet (100 mg total) by mouth daily. With FOOD AND PLENTY OF WATER PRN for flares  mupirocin ointment (BACTROBAN) 2 % Apply 1 Application topically 2 (two) times daily. Apply topical to the affected area twice a day until clear  triamcinolone acetonide (KENALOG) 10 MG/ML injection 20 mg     Return in about 2 weeks (around 01/03/2023) for Hidradenitis Suppurativa f/u.   Documentation: I have reviewed the above documentation for accuracy and completeness, and I agree with the above.  Stasia Cavalier, am acting as scribe for Langston Reusing, DO.   Langston Reusing, DO

## 2022-12-25 ENCOUNTER — Other Ambulatory Visit: Payer: Self-pay | Admitting: Physician Assistant

## 2022-12-26 MED FILL — Dexamethasone Sodium Phosphate Inj 100 MG/10ML: INTRAMUSCULAR | Qty: 1 | Status: AC

## 2022-12-26 MED FILL — Fosaprepitant Dimeglumine For IV Infusion 150 MG (Base Eq): INTRAVENOUS | Qty: 5 | Status: AC

## 2022-12-27 ENCOUNTER — Inpatient Hospital Stay: Payer: BC Managed Care – PPO | Attending: Hematology and Oncology

## 2022-12-27 ENCOUNTER — Other Ambulatory Visit: Payer: Self-pay

## 2022-12-27 ENCOUNTER — Inpatient Hospital Stay: Payer: BC Managed Care – PPO

## 2022-12-27 ENCOUNTER — Encounter: Payer: BC Managed Care – PPO | Admitting: Infectious Diseases

## 2022-12-27 ENCOUNTER — Encounter: Payer: Self-pay | Admitting: Dermatology

## 2022-12-27 ENCOUNTER — Other Ambulatory Visit: Payer: BC Managed Care – PPO

## 2022-12-27 ENCOUNTER — Inpatient Hospital Stay (HOSPITAL_BASED_OUTPATIENT_CLINIC_OR_DEPARTMENT_OTHER): Payer: BC Managed Care – PPO | Admitting: Hematology and Oncology

## 2022-12-27 VITALS — BP 128/78 | HR 86 | Temp 99.2°F | Resp 18 | Ht 76.0 in | Wt 240.0 lb

## 2022-12-27 DIAGNOSIS — N183 Chronic kidney disease, stage 3 unspecified: Secondary | ICD-10-CM | POA: Diagnosis not present

## 2022-12-27 DIAGNOSIS — C8174 Other classical Hodgkin lymphoma, lymph nodes of axilla and upper limb: Secondary | ICD-10-CM | POA: Insufficient documentation

## 2022-12-27 DIAGNOSIS — C8118 Nodular sclerosis classical Hodgkin lymphoma, lymph nodes of multiple sites: Secondary | ICD-10-CM

## 2022-12-27 DIAGNOSIS — Z95828 Presence of other vascular implants and grafts: Secondary | ICD-10-CM | POA: Insufficient documentation

## 2022-12-27 DIAGNOSIS — Z5111 Encounter for antineoplastic chemotherapy: Secondary | ICD-10-CM | POA: Insufficient documentation

## 2022-12-27 DIAGNOSIS — Z21 Asymptomatic human immunodeficiency virus [HIV] infection status: Secondary | ICD-10-CM | POA: Insufficient documentation

## 2022-12-27 DIAGNOSIS — Z79899 Other long term (current) drug therapy: Secondary | ICD-10-CM | POA: Insufficient documentation

## 2022-12-27 DIAGNOSIS — Z888 Allergy status to other drugs, medicaments and biological substances status: Secondary | ICD-10-CM | POA: Insufficient documentation

## 2022-12-27 DIAGNOSIS — K5900 Constipation, unspecified: Secondary | ICD-10-CM | POA: Insufficient documentation

## 2022-12-27 DIAGNOSIS — L732 Hidradenitis suppurativa: Secondary | ICD-10-CM | POA: Diagnosis not present

## 2022-12-27 LAB — CBC WITH DIFFERENTIAL (CANCER CENTER ONLY)
Abs Immature Granulocytes: 0.01 10*3/uL (ref 0.00–0.07)
Basophils Absolute: 0 10*3/uL (ref 0.0–0.1)
Basophils Relative: 1 %
Eosinophils Absolute: 0 10*3/uL (ref 0.0–0.5)
Eosinophils Relative: 0 %
HCT: 33.5 % — ABNORMAL LOW (ref 39.0–52.0)
Hemoglobin: 11.4 g/dL — ABNORMAL LOW (ref 13.0–17.0)
Immature Granulocytes: 0 %
Lymphocytes Relative: 35 %
Lymphs Abs: 1.2 10*3/uL (ref 0.7–4.0)
MCH: 30 pg (ref 26.0–34.0)
MCHC: 34 g/dL (ref 30.0–36.0)
MCV: 88.2 fL (ref 80.0–100.0)
Monocytes Absolute: 0.5 10*3/uL (ref 0.1–1.0)
Monocytes Relative: 13 %
Neutro Abs: 1.8 10*3/uL (ref 1.7–7.7)
Neutrophils Relative %: 51 %
Platelet Count: 197 10*3/uL (ref 150–400)
RBC: 3.8 MIL/uL — ABNORMAL LOW (ref 4.22–5.81)
RDW: 19.1 % — ABNORMAL HIGH (ref 11.5–15.5)
WBC Count: 3.6 10*3/uL — ABNORMAL LOW (ref 4.0–10.5)
nRBC: 0 % (ref 0.0–0.2)

## 2022-12-27 LAB — CMP (CANCER CENTER ONLY)
ALT: 10 U/L (ref 0–44)
AST: 11 U/L — ABNORMAL LOW (ref 15–41)
Albumin: 4 g/dL (ref 3.5–5.0)
Alkaline Phosphatase: 71 U/L (ref 38–126)
Anion gap: 3 — ABNORMAL LOW (ref 5–15)
BUN: 18 mg/dL (ref 6–20)
CO2: 29 mmol/L (ref 22–32)
Calcium: 9.1 mg/dL (ref 8.9–10.3)
Chloride: 108 mmol/L (ref 98–111)
Creatinine: 1.29 mg/dL — ABNORMAL HIGH (ref 0.61–1.24)
GFR, Estimated: >60 mL/min (ref >60)
Glucose, Bld: 95 mg/dL (ref 70–99)
Potassium: 4.2 mmol/L (ref 3.5–5.1)
Sodium: 140 mmol/L (ref 135–145)
Total Bilirubin: 0.4 mg/dL (ref 0.3–1.2)
Total Protein: 7.3 g/dL (ref 6.5–8.1)

## 2022-12-27 MED ORDER — PALONOSETRON HCL INJECTION 0.25 MG/5ML
0.2500 mg | Freq: Once | INTRAVENOUS | Status: AC
  Administered 2022-12-27: 0.25 mg via INTRAVENOUS
  Filled 2022-12-27: qty 5

## 2022-12-27 MED ORDER — SODIUM CHLORIDE 0.9 % IV SOLN
150.0000 mg | Freq: Once | INTRAVENOUS | Status: AC
  Administered 2022-12-27: 150 mg via INTRAVENOUS
  Filled 2022-12-27: qty 150

## 2022-12-27 MED ORDER — DIPHENHYDRAMINE HCL 50 MG/ML IJ SOLN
50.0000 mg | Freq: Once | INTRAMUSCULAR | Status: AC
  Administered 2022-12-27: 50 mg via INTRAVENOUS
  Filled 2022-12-27: qty 1

## 2022-12-27 MED ORDER — SODIUM CHLORIDE 0.9 % IV SOLN: Freq: Once | INTRAVENOUS | Status: AC

## 2022-12-27 MED ORDER — SODIUM CHLORIDE 0.9% FLUSH
10.0000 mL | INTRAVENOUS | Status: DC | PRN
  Administered 2022-12-27: 10 mL

## 2022-12-27 MED ORDER — ACETAMINOPHEN 325 MG PO TABS
650.0000 mg | ORAL_TABLET | Freq: Once | ORAL | Status: AC
  Administered 2022-12-27: 650 mg via ORAL
  Filled 2022-12-27: qty 2

## 2022-12-27 MED ORDER — DOXORUBICIN HCL CHEMO IV INJECTION 2 MG/ML
25.0000 mg/m2 | Freq: Once | INTRAVENOUS | Status: AC
  Administered 2022-12-27: 58 mg via INTRAVENOUS
  Filled 2022-12-27: qty 29

## 2022-12-27 MED ORDER — VINBLASTINE SULFATE CHEMO INJECTION 1 MG/ML
6.0000 mg/m2 | Freq: Once | INTRAVENOUS | Status: AC
  Administered 2022-12-27: 14 mg via INTRAVENOUS
  Filled 2022-12-27: qty 14

## 2022-12-27 MED ORDER — HEPARIN SOD (PORK) LOCK FLUSH 100 UNIT/ML IV SOLN
500.0000 [IU] | Freq: Once | INTRAVENOUS | Status: AC | PRN
  Administered 2022-12-27: 500 [IU]

## 2022-12-27 MED ORDER — SODIUM CHLORIDE 0.9 % IV SOLN
900.0000 mg | Freq: Once | INTRAVENOUS | Status: AC
  Administered 2022-12-27: 900 mg via INTRAVENOUS
  Filled 2022-12-27: qty 90

## 2022-12-27 MED ORDER — SODIUM CHLORIDE 0.9 % IV SOLN
10.0000 mg | Freq: Once | INTRAVENOUS | Status: AC
  Administered 2022-12-27: 10 mg via INTRAVENOUS
  Filled 2022-12-27: qty 10

## 2022-12-27 NOTE — Progress Notes (Signed)
[facility] [phone]   [phone]  PROGRESS NOTE  Patient Care Team: Shirline Frees, NP as PCP - General (Family Medicine) Sidney Ace, MD as Referring Physician (Allergy) Jaci Standard, MD as Consulting Physician (Hematology and Oncology)  Hematological/Oncological History # Hodgkin's Lymphoma, Stage III 06/08/2022: CT neck showed Bulky adenopathy in the deep right pectoral region, recommend chest CT with contrast. There was splenomegaly on abdominal CT from the summer, consider addition of abdominal CT is well. 07/01/2023: CT chest showed Bulky RIGHT axillary lymph node enlargement, with nodal mass measuring 7 cm in length. Findings suspicious for lymphoproliferative disease 09/13/2022: US guided biopsy of right axillary lymphadenopathy was consistent with classical Hodgkin's lymphoma  10/06/2022: establish care with Dr. Leonides Schanz  10/31/2022: Cycle 1 Day 1 of A-AVD chemotherapy.  11/14/2022: Cycle 1 Day 15 of A-AVD chemotherapy 11/29/2022: Cycle 2 Day 1 of A-AVD chemotherapy 12/13/2022: Cycle 2 Day 15 of A-AVD chemotherapy (holding brentuximab and Udenyca due to worsening skin lesions from hidradenitis suppurativa) 12/27/2022: Cycle 3 Day 1 of AVD chemotherapy   Interval History:  Lee [name] 42 y.o. male with medical history significant for stage III nodular sclerosing Hodgkin's lymphoma who presents for a follow up visit. The patient's last visit was on 11/29/2022. In the interim since the last visit he completed Cycle 2, Day 15 of A-AVD chemotherapy. He is unaccompanied for this visit.   On exam today Lee [name] reports his skin lesions have greatly improved in the interim since her last visit.  He does have some residual on the right side of his neck but overall the ones on his scalp and his left side of face have resolved.  He notes that he has been taking doxycycline as well as Bactroban to try to help with this.  He reports his pain is  currently about a 1.2-2 out of 10.  He reports that he has not had any falls, lightheadedness, dizziness.  He is also not had any nausea, vomiting, or diarrhea with the last cycle of chemotherapy.  His appetite is improved.  He did have his hair fall out and his beard has become sparse. He denies fevers, chills, sweats, shortness of breath, chest pain or cough. He has no other complaints.  He is willing and able to proceed with treatment today.  A full 10 point ROS is otherwise negative.  MEDICAL HISTORY:  Past Medical History:  Diagnosis Date   CKD (chronic kidney disease) stage 3, GFR 30-59 ml/min (HCC) 02/20/2019   Eczema    Environmental allergies    Essential hypertension    HIV test positive (HCC)    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HCC) 07/24/2016   Shingles     SURGICAL HISTORY: Past Surgical History:  Procedure Laterality Date   CHOLECYSTECTOMY N/A 12/30/2021   Procedure: LAPAROSCOPIC CHOLECYSTECTOMY WITH  CHOLANGIOGRAM;  Surgeon: Darnell Level, MD;  Location: [facility];  Service: General;  Laterality: N/A;   IR IMAGING GUIDED PORT INSERTION  10/18/2022   LAPAROSCOPIC APPENDECTOMY N/A 03/12/2020   Procedure: APPENDECTOMY LAPAROSCOPIC;  Surgeon: Romie Levee, MD;  Location: [facility];  Service: General;  Laterality: N/A;   TYMPANOSTOMY TUBE PLACEMENT     WISDOM TOOTH EXTRACTION     WRIST SURGERY      SOCIAL HISTORY: Social History   Socioeconomic History   Marital status: Single    Spouse name: Not on file   Number of children: Not on file   Years of education: Not on file  Highest education level: Associate degree: academic program  Occupational History   Not on file  Tobacco Use   Smoking status: Never   Smokeless tobacco: Never  Vaping Use   Vaping Use: Never used  Substance and Sexual Activity   Alcohol use: No   Drug use: No   Sexual activity: Not Currently    Partners: Male    Birth control/protection: Condom    Comment: Given Condoms  Other Topics  Concern   Not on file  Social History Narrative   Works as Acupuncturist    -Not married   Social Determinants of Health   Financial Resource Strain: Low Risk  (02/09/2022)   Overall Financial Resource Strain (CARDIA)    Difficulty of Paying Living Expenses: Not hard at all  Food Insecurity: No Food Insecurity (06/27/2022)   Hunger Vital Sign    Worried About Running Out of Food in the Last Year: Never true    Ran Out of Food in the Last Year: Never true  Transportation Needs: No Transportation Needs (06/27/2022)   PRAPARE - Administrator, Civil Service (Medical): No    Lack of Transportation (Non-Medical): No  Physical Activity: Unknown (02/09/2022)   Exercise Vital Sign    Days of Exercise per Week: 0 days    Minutes of Exercise per Session: Not on file  Stress: Stress Concern Present (02/09/2022)   [employer] of Occupational Health - Occupational Stress Questionnaire    Feeling of Stress : To some extent  Social Connections: Moderately Integrated (02/09/2022)   Social Connection and Isolation Panel [NHANES]    Frequency of Communication with Friends and Family: More than three times a week    Frequency of Social Gatherings with Friends and Family: Not on file    Attends Religious Services: More than 4 times per year    Active Member of [employer] or Organizations: Yes    Attends Engineer, structural: More than 4 times per year    Marital Status: Never married  Catering manager Violence: Not on file    FAMILY HISTORY: Family History  Problem Relation Age of Onset   Hypercalcemia Mother    Hypertension Mother    Hypertension Father     ALLERGIES:  is allergic to gabapentin, lisinopril, and nickel.  MEDICATIONS:  Current Outpatient Medications  Medication Sig Dispense Refill   acetaminophen (TYLENOL) 650 MG CR tablet Take 650 mg by mouth every 8 (eight) hours as needed for pain.     albuterol (PROVENTIL HFA;VENTOLIN HFA) 108 (90 Base) MCG/ACT inhaler Inhale  2 puffs into the lungs every 6 (six) hours as needed for wheezing or shortness of breath. 1 Inhaler 1   amLODipine (NORVASC) 10 MG tablet Take 1 tablet by mouth once daily 30 tablet 0   amoxicillin-clavulanate (AUGMENTIN) 875-125 MG tablet Take 1 tablet by mouth 2 (two) times daily. 21 tablet 0   azelastine (ASTELIN) 0.1 % nasal spray 1-2 puffs in each nostril twice daily     azelastine (OPTIVAR) 0.05 % ophthalmic solution Apply to eye.     [name] 50-200-25 MG TABS tablet TAKE 1 TABLET DAILY 30 tablet 5   BREO ELLIPTA 200-25 MCG/ACT AEPB Inhale 1 puff into the lungs daily.     doxycycline (VIBRA-TABS) 100 MG tablet Take 1 tablet (100 mg total) by mouth daily. For 7 days With FOOD AND PLENTY OF WATER.  Repeat  PRN for flares 30 tablet 0   EPINEPHrine 0.3 mg/0.3 mL IJ SOAJ injection Inject  0.3 mg into the muscle once.     lidocaine-prilocaine (EMLA) cream Apply 1 Application topically as needed. 30 g 0   loratadine (CLARITIN) 10 MG tablet Take 10 mg by mouth daily.     methylPREDNISolone (MEDROL DOSEPAK) 4 MG TBPK tablet Take 6 pills by mouth day 1, 5 on day 2, 4 on day 3, 3 on day 4, 2 on day 5, 1 on day 6 21 tablet 0   montelukast (SINGULAIR) 10 MG tablet Take 10 mg by mouth at bedtime.     mupirocin ointment (BACTROBAN) 2 % Apply 1 Application topically 2 (two) times daily. Apply topical to the affected area twice a day until clear 30 g 3   ondansetron (ZOFRAN) 8 MG tablet Take 1 tablet (8 mg total) by mouth every 8 (eight) hours as needed. 30 tablet 0   ondansetron (ZOFRAN-ODT) 8 MG disintegrating tablet Take 1 tablet (8 mg total) by mouth every 8 (eight) hours as needed for nausea or vomiting. 30 tablet 0   oxyCODONE (OXYCONTIN) 10 mg 12 hr tablet Take 1 tablet (10 mg total) by mouth every 12 (twelve) hours. 14 tablet 0   oxyCODONE 10 MG TABS Take 1 tablet (10 mg total) by mouth every 4 (four) hours as needed for severe pain. 90 tablet 0   prochlorperazine (COMPAZINE) 10 MG tablet Take 1  tablet (10 mg total) by mouth every 6 (six) hours as needed for nausea or vomiting. 30 tablet 0   Vitamin D, Ergocalciferol, (DRISDOL) 1.25 MG (50000 UT) CAPS capsule Take 50,000 Units by mouth once a week. Friday     No current facility-administered medications for this visit.   Facility-Administered Medications Ordered in Other Visits  Medication Dose Route Frequency Provider Last Rate Last Admin   sodium chloride flush (NS) 0.9 % injection 10 mL  10 mL Intracatheter PRN Jaci Standard, MD   10 mL at 12/27/22 1615    REVIEW OF SYSTEMS:   Constitutional: ( - ) fevers, ( - )  chills , ( - ) night sweats Eyes: ( - ) blurriness of vision, ( - ) double vision, ( - ) watery eyes Ears, nose, mouth, throat, and face: ( - ) mucositis, ( - ) sore throat Respiratory: ( - ) cough, ( - ) dyspnea, ( - ) wheezes Cardiovascular: ( - ) palpitation, ( - ) chest discomfort, ( - ) lower extremity swelling Gastrointestinal:  ( - ) nausea, ( - ) heartburn, ( - ) change in bowel habits Skin: ( - ) abnormal skin rashes Lymphatics: ( - ) new lymphadenopathy, ( - ) easy bruising Neurological: ( - ) numbness, ( - ) tingling, ( - ) new weaknesses Behavioral/Psych: ( - ) mood change, ( - ) new changes  All other systems were reviewed with the patient and are negative.  PHYSICAL EXAMINATION: ECOG PERFORMANCE STATUS: 1 - Symptomatic but completely ambulatory  Vitals:   12/27/22 1125  BP: 128/78  Pulse: 86  Resp: 18  Temp: 99.2 F (37.3 C)  SpO2: 100%   Filed Weights   12/27/22 1125  Weight: 240 lb (108.9 kg)     GENERAL: Well-appearing young African-American male, alert, no distress and comfortable SKIN: skin color, texture, turgor are normal, no rashes. Multifocal subcutaneous cysts involving face, right axilla, scalp.  EYES: conjunctiva are pink and non-injected, sclera clear LUNGS: clear to auscultation and percussion with normal breathing effort HEART: regular rate & rhythm and no murmurs and  no lower  extremity edema Musculoskeletal: no cyanosis of digits and no clubbing  PSYCH: alert & oriented x 3, fluent speech NEURO: no focal motor/sensory deficits  LABORATORY DATA:  I have reviewed the data as listed    Latest Ref Rng & Units 12/27/2022   11:08 AM 12/13/2022    9:54 AM 11/29/2022    9:33 AM  CBC  WBC 4.0 - 10.5 K/uL 3.6  23.0  16.1   Hemoglobin 13.0 - 17.0 g/dL 82.9  56.2  13.0   Hematocrit 39.0 - 52.0 % 33.5  34.0  34.2   Platelets 150 - 400 K/uL 197  287  264        Latest Ref Rng & Units 12/27/2022   11:08 AM 12/13/2022    9:54 AM 11/29/2022    9:33 AM  CMP  Glucose 70 - 99 mg/dL 95  865  784   BUN 6 - 20 mg/dL 18  33  19   Creatinine 0.61 - 1.24 mg/dL 6.96  2.95  2.84   Sodium 135 - 145 mmol/L 140  138  138   Potassium 3.5 - 5.1 mmol/L 4.2  3.8  3.8   Chloride 98 - 111 mmol/L 108  108  106   CO2 22 - 32 mmol/L 29  23  28    Calcium 8.9 - 10.3 mg/dL 9.1  8.9  9.0   Total Protein 6.5 - 8.1 g/dL 7.3  7.8  8.1   Total Bilirubin 0.3 - 1.2 mg/dL 0.4  0.3  0.3   Alkaline Phos 38 - 126 U/L 71  132  146   AST 15 - 41 U/L 11  16  13    ALT 0 - 44 U/L 10  18  16       RADIOGRAPHIC STUDIES: I have personally reviewed the radiological images as listed and agreed with the findings in the report: FDG avid lymph nodes on both sides of the diaphragm, most prominently in the cervical regions and axilla.  Consistent with at least stage III disease. NM PET Image Restag (PS) Skull Base To Thigh  Result Date: 12/25/2022 CLINICAL DATA:  Subsequent treatment strategy for Hodgkin's lymphoma. EXAM: NUCLEAR MEDICINE PET SKULL BASE TO THIGH TECHNIQUE: 11.5 mCi F-18 FDG was injected intravenously. Full-ring PET imaging was performed from the skull base to thigh after the radiotracer. CT data was obtained and used for attenuation correction and anatomic localization. Fasting blood glucose: 98 mg/dl COMPARISON:  13/24/4010 FINDINGS: Mediastinal blood pool activity: SUV max 2.1 Liver activity:  SUV max 3.0 NECK: No hypermetabolic cervical lymphadenopathy. Incidental CT findings: None. CHEST: Residual 18 mm short axis right axillary node with max SUV 1.7. Additional thoracic lymphadenopathy has essentially resolved. No hypermetabolic pulmonary nodules. Right chest port terminates at the cavoatrial junction. Incidental CT findings: None. ABDOMEN/PELVIS: Spleen is top-normal in size, without focal hypermetabolism. Reference max SUV 2.2. No hypermetabolic abdominopelvic lymphadenopathy. No abnormal metabolism in the liver, pancreas, or adrenal glands. Incidental CT findings: Status post cholecystectomy. Prior appendectomy. Mild atherosclerotic calcifications of the abdominal aorta and branch vessels. SKELETON: No focal hypermetabolic activity to suggest skeletal metastasis. Incidental CT findings: None. IMPRESSION: Near complete metabolic response. Residual 18 mm short axis right axillary node (Deauville criteria 2). Electronically Signed   By: Charline Bills M.D.   On: 12/25/2022 00:23    ASSESSMENT & PLAN Lee [name] 42 y.o. male with medical history significant for stage III nodular sclerosing Hodgkin's lymphoma who presents for a follow up visit.   After review  of the labs, review of the records, and discussion with the patient the patients findings are most consistent with classical Hodgkin's Lymphoma.    # Hodgkin's Lymphoma, Stage III -- PET CT scan staging complete, findings consistent with stage III Hodgkin's lymphoma. --Will plan to proceed with A-AVD chemotherapy. --Echocardiogram complete, shows excellent baseline cardiac function. Plan:  --Labs today show creatinine 1.29, WBC 3.6, Hgb 11.4, MCV 88.2, Plt 197 --OK to proceed with Cycle 3 Day 1 of AVD chemotherapy today (holding Brentuximab due to symptoms/worsening skin lesions with long acting GCSF).  --Due to worsening skin lesions that is likely flaring up from Udenyca injection, we will hold Brentuximab and Udenyca  injection moving forward --Mid treatment PET CT scan on 12/19/2022 showed excellent response to treatment (Deauville 2).  --RTC in 2 weeks for Cycle 3 Day 15 of treatment.   #Hidradenitis Suppurativa--improving --Multifocal subcutenous nodules seen on physical exam --Under the care of dermatology and relates flare ups to GCSF injection --Continue with oral doxycycline and intralesional kenalog --Continue with prescribed pain regimen as needed.  #Constipation: --Likely secondary to opoid pain medication --Recommend to try OTC stool softeners such as senna or miralax.    #Supportive Care -- chemotherapy education complete -- port placed -- zofran 8mg  q8H PRN and compazine 10mg  PO q6H for nausea -- EMLA cream for port -- no pain medication required at this time.   Orders Placed This Encounter  Procedures   CBC with Differential (Cancer Center Only)    Standing Status:   Future    Standing Expiration Date:   02/20/2024   CMP (Cancer Center only)    Standing Status:   Future    Standing Expiration Date:   02/20/2024   CBC with Differential (Cancer Center Only)    Standing Status:   Future    Standing Expiration Date:   03/05/2024   CMP (Cancer Center only)    Standing Status:   Future    Standing Expiration Date:   03/05/2024   CBC with Differential (Cancer Center Only)    Standing Status:   Future    Standing Expiration Date:   03/19/2024   CMP (Cancer Center only)    Standing Status:   Future    Standing Expiration Date:   03/19/2024   CBC with Differential (Cancer Center Only)    Standing Status:   Future    Standing Expiration Date:   04/02/2024   CMP (Cancer Center only)    Standing Status:   Future    Standing Expiration Date:   04/02/2024   All questions were answered. The patient knows to call the clinic with any problems, questions or concerns.  I have spent a total of 30 minutes minutes of face-to-face and non-face-to-face time, preparing to see the patient,performing a  medically appropriate examination, counseling and educating the patient, ordering medications/tests/procedures,documenting clinical information in the electronic health record, and care coordination.   Ulysees Barns, MD Department of Hematology/Oncology [facility] Phone: [phone] Pager: [phone] Email: [email or web address]  12/27/2022 5:17 PM

## 2022-12-27 NOTE — Patient Instructions (Signed)
[facility] [place]  Discharge Instructions: Thank you for choosing [facility] to provide your oncology and hematology care.   If you have a lab appointment with the Cancer Center, please go directly to the Cancer Center and check in at the registration area.   Wear comfortable clothing and clothing appropriate for easy access to any Portacath or PICC line.   We strive to give you quality time with your provider. You may need to reschedule your appointment if you arrive late (15 or more minutes).  Arriving late affects you and other patients whose appointments are after yours.  Also, if you miss three or more appointments without notifying the office, you may be dismissed from the clinic at the provider's discretion.      For prescription refill requests, have your pharmacy contact our office and allow 72 hours for refills to be completed.    Today you received the following chemotherapy and/or immunotherapy agents Adriamycin, Vinblastine, Dacarbazine      To help prevent nausea and vomiting after your treatment, we encourage you to take your nausea medication as directed.  BELOW ARE SYMPTOMS THAT SHOULD BE REPORTED IMMEDIATELY: *FEVER GREATER THAN 100.4 F (38 C) OR HIGHER *CHILLS OR SWEATING *NAUSEA AND VOMITING THAT IS NOT CONTROLLED WITH YOUR NAUSEA MEDICATION *UNUSUAL SHORTNESS OF BREATH *UNUSUAL BRUISING OR BLEEDING *URINARY PROBLEMS (pain or burning when urinating, or frequent urination) *BOWEL PROBLEMS (unusual diarrhea, constipation, pain near the anus) TENDERNESS IN MOUTH AND THROAT WITH OR WITHOUT PRESENCE OF ULCERS (sore throat, sores in mouth, or a toothache) UNUSUAL RASH, SWELLING OR PAIN  UNUSUAL VAGINAL DISCHARGE OR ITCHING   Items with * indicate a potential emergency and should be followed up as soon as possible or go to the Emergency Department if any problems should occur.  Please show the CHEMOTHERAPY ALERT CARD or  IMMUNOTHERAPY ALERT CARD at check-in to the Emergency Department and triage nurse.  Should you have questions after your visit or need to cancel or reschedule your appointment, please contact [facility]  Dept: [phone]  and follow the prompts.  Office hours are 8:00 a.m. to 4:30 p.m. Monday - Friday. Please note that voicemails left after 4:00 p.m. may not be returned until the following business day.  We are closed weekends and major holidays. You have access to a nurse at all times for urgent questions. Please call the main number to the clinic Dept: [phone] and follow the prompts.   For any non-urgent questions, you may also contact your provider using MyChart. We now offer e-Visits for anyone 11 and older to request care online for non-urgent symptoms. For details visit [email or web address].   Also download the MyChart app! Go to the app store, search "MyChart", open the app, select [facility], and log in with your MyChart username and password.

## 2022-12-29 ENCOUNTER — Ambulatory Visit: Payer: BC Managed Care – PPO

## 2022-12-29 ENCOUNTER — Other Ambulatory Visit: Payer: Self-pay

## 2022-12-30 ENCOUNTER — Other Ambulatory Visit: Payer: Self-pay | Admitting: Infectious Diseases

## 2022-12-30 DIAGNOSIS — I10 Essential (primary) hypertension: Secondary | ICD-10-CM

## 2023-01-03 ENCOUNTER — Encounter: Payer: Self-pay | Admitting: Dermatology

## 2023-01-03 ENCOUNTER — Ambulatory Visit (INDEPENDENT_AMBULATORY_CARE_PROVIDER_SITE_OTHER): Payer: BC Managed Care – PPO | Admitting: Dermatology

## 2023-01-03 VITALS — BP 121/76

## 2023-01-03 DIAGNOSIS — L732 Hidradenitis suppurativa: Secondary | ICD-10-CM

## 2023-01-03 NOTE — Progress Notes (Signed)
   Follow Up Visit   Subjective  Lee [name] is a 42 y.o. male who presents for the following: HS induced by immunobooster Indencie.  Bumps at the right underarm and submental area. He says they are flatter and less irritated. Treatment with Doxycycline 100mg  1 x daily for 1 week and Kenalog 20mg  ILK injections. He reports having a change in type of chemo. Stopped Indenica immuno booster and immunotherapy.  Since then all the cystic lesions on the beard area, scalp, and axillae have resolved.  He is continuing chemotherapy through till October.  The following portions of the chart were reviewed this encounter and updated as appropriate: medications, allergies, medical history  Review of Systems:  No other skin or systemic complaints except as noted in HPI or Assessment and Plan.  Objective  Well appearing patient in no apparent distress; mood and affect are within normal limits.  A focused examination was performed of the following areas:  Right axilla, submental neck area  Relevant exam findings are noted in the Assessment and Plan.    Assessment & Plan     HIDRADENITIS SUPPURATIVA (drug induced) Exam: Some firm keloidal nodules in beard area in areas of prior HS lesions.  No active boils  Improved   Treatment Plan: Advised regular skin care regimen. Lever 2000 or Dial can be used at the underarm and groin. Marice Potter can be used on the rest of the body.   Discussed possible side effect of induration from ILK injections. Holding off of ILK injections today. However he does have a few keloidal lesions at previous sites and will consider ILK injections in the future.   Advised to massage areas under the chin to aid in flattening scar tissue.   Return in about 2 months (around 03/05/2023).  Jaclynn Guarneri, CMA, am acting as scribe for [employer], DO.   Documentation: I have reviewed the above documentation for accuracy and completeness, and I agree with the  above.  Langston Reusing, DO

## 2023-01-03 NOTE — Patient Instructions (Addendum)
Treatment Plan: Advised regular skin care regimen. Lever 2000 or Dial can be used at the underarm and groin. Marice Potter can be used on the rest of the body.  Discussed possible side effect of induration from ILK injections. Holding off of ILK injections today. However he does have a few keloidal lesions at previous sites and will consider ILK injections in the future. Advised to massage areas under the chin to aid in flattening scar tissue.       Due to recent changes in healthcare laws, you may see results of your pathology and/or laboratory studies on MyChart before the doctors have had a chance to review them. We understand that in some cases there may be results that are confusing or concerning to you. Please understand that not all results are received at the same time and often the doctors may need to interpret multiple results in order to provide you with the best plan of care or course of treatment. Therefore, we ask that you please give [place] 2 business days to thoroughly review all your results before contacting the office for clarification. Should we see a critical lab result, you will be contacted sooner.   If You Need Anything After Your Visit  If you have any questions or concerns for your doctor, please call our main line at [phone] If no one answers, please leave a voicemail as directed and we will return your call as soon as possible. Messages left after 4 pm will be answered the following business day.   You may also send [place] a message via MyChart. We typically respond to MyChart messages within 1-2 business days.  For prescription refills, please ask your pharmacy to contact our office. Our fax number is [phone].  If you have an urgent issue when the clinic is closed that cannot wait until the next business day, you can page your doctor at the number below.    Please note that while we do our best to be available for urgent issues outside of office hours, we are not  available 24/7.   If you have an urgent issue and are unable to reach [place], you may choose to seek medical care at your doctor's office, retail clinic, urgent care center, or emergency room.  If you have a medical emergency, please immediately call 911 or go to the emergency department. In the event of inclement weather, please call our main line at [phone] for an update on the status of any delays or closures.  Dermatology Medication Tips: Please keep the boxes that topical medications come in in order to help keep track of the instructions about where and how to use these. Pharmacies typically print the medication instructions only on the boxes and not directly on the medication tubes.   If your medication is too expensive, please contact our office at [phone] or send [place] a message through MyChart.   We are unable to tell what your co-pay for medications will be in advance as this is different depending on your insurance coverage. However, we may be able to find a substitute medication at lower cost or fill out paperwork to get insurance to cover a needed medication.   If a prior authorization is required to get your medication covered by your insurance company, please allow [place] 1-2 business days to complete this process.  Drug prices often vary depending on where the prescription is filled and some pharmacies may offer cheaper prices.  The website www.goodrx.com contains coupons for medications through  different pharmacies. The prices here do not account for what the cost may be with help from insurance (it may be cheaper with your insurance), but the website can give you the price if you did not use any insurance.  - You can print the associated coupon and take it with your prescription to the pharmacy.  - You may also stop by our office during regular business hours and pick up a GoodRx coupon card.  - If you need your prescription sent electronically to a different pharmacy, notify  our office through [facility] or by phone at [phone]

## 2023-01-07 ENCOUNTER — Other Ambulatory Visit: Payer: Self-pay

## 2023-01-09 MED FILL — Fosaprepitant Dimeglumine For IV Infusion 150 MG (Base Eq): INTRAVENOUS | Qty: 5 | Status: AC

## 2023-01-09 MED FILL — Dexamethasone Sodium Phosphate Inj 100 MG/10ML: INTRAMUSCULAR | Qty: 1 | Status: AC

## 2023-01-10 ENCOUNTER — Inpatient Hospital Stay (HOSPITAL_BASED_OUTPATIENT_CLINIC_OR_DEPARTMENT_OTHER): Payer: BC Managed Care – PPO | Admitting: Hematology and Oncology

## 2023-01-10 ENCOUNTER — Inpatient Hospital Stay: Payer: BC Managed Care – PPO

## 2023-01-10 ENCOUNTER — Other Ambulatory Visit: Payer: Self-pay

## 2023-01-10 VITALS — BP 126/77 | HR 88 | Temp 97.8°F | Resp 15 | Wt 248.1 lb

## 2023-01-10 DIAGNOSIS — Z5111 Encounter for antineoplastic chemotherapy: Secondary | ICD-10-CM | POA: Diagnosis not present

## 2023-01-10 DIAGNOSIS — Z95828 Presence of other vascular implants and grafts: Secondary | ICD-10-CM

## 2023-01-10 DIAGNOSIS — C8174 Other classical Hodgkin lymphoma, lymph nodes of axilla and upper limb: Secondary | ICD-10-CM | POA: Diagnosis not present

## 2023-01-10 DIAGNOSIS — C8118 Nodular sclerosis classical Hodgkin lymphoma, lymph nodes of multiple sites: Secondary | ICD-10-CM

## 2023-01-10 DIAGNOSIS — Z888 Allergy status to other drugs, medicaments and biological substances status: Secondary | ICD-10-CM | POA: Diagnosis not present

## 2023-01-10 DIAGNOSIS — Z21 Asymptomatic human immunodeficiency virus [HIV] infection status: Secondary | ICD-10-CM | POA: Diagnosis not present

## 2023-01-10 DIAGNOSIS — L732 Hidradenitis suppurativa: Secondary | ICD-10-CM | POA: Diagnosis not present

## 2023-01-10 DIAGNOSIS — N183 Chronic kidney disease, stage 3 unspecified: Secondary | ICD-10-CM | POA: Diagnosis not present

## 2023-01-10 DIAGNOSIS — K5900 Constipation, unspecified: Secondary | ICD-10-CM | POA: Diagnosis not present

## 2023-01-10 DIAGNOSIS — Z79899 Other long term (current) drug therapy: Secondary | ICD-10-CM | POA: Diagnosis not present

## 2023-01-10 LAB — CBC WITH DIFFERENTIAL (CANCER CENTER ONLY)
Abs Immature Granulocytes: 0 10*3/uL (ref 0.00–0.07)
Basophils Absolute: 0 10*3/uL (ref 0.0–0.1)
Basophils Relative: 1 %
Eosinophils Absolute: 0 10*3/uL (ref 0.0–0.5)
Eosinophils Relative: 1 %
HCT: 34.5 % — ABNORMAL LOW (ref 39.0–52.0)
Hemoglobin: 11.9 g/dL — ABNORMAL LOW (ref 13.0–17.0)
Immature Granulocytes: 0 %
Lymphocytes Relative: 52 %
Lymphs Abs: 1 10*3/uL (ref 0.7–4.0)
MCH: 30.2 pg (ref 26.0–34.0)
MCHC: 34.5 g/dL (ref 30.0–36.0)
MCV: 87.6 fL (ref 80.0–100.0)
Monocytes Absolute: 0.3 10*3/uL (ref 0.1–1.0)
Monocytes Relative: 14 %
Neutro Abs: 0.6 10*3/uL — ABNORMAL LOW (ref 1.7–7.7)
Neutrophils Relative %: 32 %
Platelet Count: 176 10*3/uL (ref 150–400)
RBC: 3.94 MIL/uL — ABNORMAL LOW (ref 4.22–5.81)
RDW: 17.7 % — ABNORMAL HIGH (ref 11.5–15.5)
WBC Count: 1.9 10*3/uL — ABNORMAL LOW (ref 4.0–10.5)
nRBC: 0 % (ref 0.0–0.2)

## 2023-01-10 LAB — CMP (CANCER CENTER ONLY)
ALT: 10 U/L (ref 0–44)
AST: 15 U/L (ref 15–41)
Albumin: 3.9 g/dL (ref 3.5–5.0)
Alkaline Phosphatase: 65 U/L (ref 38–126)
Anion gap: 6 (ref 5–15)
BUN: 13 mg/dL (ref 6–20)
CO2: 26 mmol/L (ref 22–32)
Calcium: 9.2 mg/dL (ref 8.9–10.3)
Chloride: 108 mmol/L (ref 98–111)
Creatinine: 1.28 mg/dL — ABNORMAL HIGH (ref 0.61–1.24)
GFR, Estimated: >60 mL/min (ref >60)
Glucose, Bld: 119 mg/dL — ABNORMAL HIGH (ref 70–99)
Potassium: 3.8 mmol/L (ref 3.5–5.1)
Sodium: 140 mmol/L (ref 135–145)
Total Bilirubin: 0.6 mg/dL (ref 0.3–1.2)
Total Protein: 6.8 g/dL (ref 6.5–8.1)

## 2023-01-10 MED ORDER — SODIUM CHLORIDE 0.9 % IV SOLN
10.0000 mg | Freq: Once | INTRAVENOUS | Status: AC
  Administered 2023-01-10: 10 mg via INTRAVENOUS
  Filled 2023-01-10: qty 10

## 2023-01-10 MED ORDER — SODIUM CHLORIDE 0.9 % IV SOLN
900.0000 mg | Freq: Once | INTRAVENOUS | Status: AC
  Administered 2023-01-10: 900 mg via INTRAVENOUS
  Filled 2023-01-10: qty 90

## 2023-01-10 MED ORDER — SODIUM CHLORIDE 0.9% FLUSH
10.0000 mL | INTRAVENOUS | Status: DC | PRN
  Administered 2023-01-10: 10 mL

## 2023-01-10 MED ORDER — DOXORUBICIN HCL CHEMO IV INJECTION 2 MG/ML
25.0000 mg/m2 | Freq: Once | INTRAVENOUS | Status: AC
  Administered 2023-01-10: 58 mg via INTRAVENOUS
  Filled 2023-01-10: qty 29

## 2023-01-10 MED ORDER — SODIUM CHLORIDE 0.9 % IV SOLN: Freq: Once | INTRAVENOUS | Status: AC

## 2023-01-10 MED ORDER — DIPHENHYDRAMINE HCL 50 MG/ML IJ SOLN
50.0000 mg | Freq: Once | INTRAMUSCULAR | Status: AC
  Administered 2023-01-10: 50 mg via INTRAVENOUS
  Filled 2023-01-10: qty 1

## 2023-01-10 MED ORDER — PALONOSETRON HCL INJECTION 0.25 MG/5ML
0.2500 mg | Freq: Once | INTRAVENOUS | Status: AC
  Administered 2023-01-10: 0.25 mg via INTRAVENOUS
  Filled 2023-01-10: qty 5

## 2023-01-10 MED ORDER — ACETAMINOPHEN 325 MG PO TABS
650.0000 mg | ORAL_TABLET | Freq: Once | ORAL | Status: AC
  Administered 2023-01-10: 650 mg via ORAL
  Filled 2023-01-10: qty 2

## 2023-01-10 MED ORDER — VINBLASTINE SULFATE CHEMO INJECTION 1 MG/ML
6.0000 mg/m2 | Freq: Once | INTRAVENOUS | Status: AC
  Administered 2023-01-10: 14 mg via INTRAVENOUS
  Filled 2023-01-10: qty 14

## 2023-01-10 MED ORDER — HEPARIN SOD (PORK) LOCK FLUSH 100 UNIT/ML IV SOLN
500.0000 [IU] | Freq: Once | INTRAVENOUS | Status: AC | PRN
  Administered 2023-01-10: 500 [IU]

## 2023-01-10 MED ORDER — SODIUM CHLORIDE 0.9 % IV SOLN
150.0000 mg | Freq: Once | INTRAVENOUS | Status: AC
  Administered 2023-01-10: 150 mg via INTRAVENOUS
  Filled 2023-01-10: qty 150

## 2023-01-10 NOTE — Patient Instructions (Signed)
[facility]  Discharge Instructions: Thank you for choosing [facility] to provide your oncology and hematology care.   If you have a lab appointment with the Cancer Center, please go directly to the Cancer Center and check in at the registration area.   Wear comfortable clothing and clothing appropriate for easy access to any Portacath or PICC line.   We strive to give you quality time with your provider. You may need to reschedule your appointment if you arrive late (15 or more minutes).  Arriving late affects you and other patients whose appointments are after yours.  Also, if you miss three or more appointments without notifying the office, you may be dismissed from the clinic at the provider's discretion.      For prescription refill requests, have your pharmacy contact our office and allow 72 hours for refills to be completed.    Today you received the following chemotherapy and/or immunotherapy agents Adriamycin, Vinblastine, Dacarbazine      To help prevent nausea and vomiting after your treatment, we encourage you to take your nausea medication as directed.  BELOW ARE SYMPTOMS THAT SHOULD BE REPORTED IMMEDIATELY: *FEVER GREATER THAN 100.4 F (38 C) OR HIGHER *CHILLS OR SWEATING *NAUSEA AND VOMITING THAT IS NOT CONTROLLED WITH YOUR NAUSEA MEDICATION *UNUSUAL SHORTNESS OF BREATH *UNUSUAL BRUISING OR BLEEDING *URINARY PROBLEMS (pain or burning when urinating, or frequent urination) *BOWEL PROBLEMS (unusual diarrhea, constipation, pain near the anus) TENDERNESS IN MOUTH AND THROAT WITH OR WITHOUT PRESENCE OF ULCERS (sore throat, sores in mouth, or a toothache) UNUSUAL RASH, SWELLING OR PAIN  UNUSUAL VAGINAL DISCHARGE OR ITCHING   Items with * indicate a potential emergency and should be followed up as soon as possible or go to the Emergency Department if any problems should occur.  Please show the CHEMOTHERAPY ALERT CARD or  IMMUNOTHERAPY ALERT CARD at check-in to the Emergency Department and triage nurse.  Should you have questions after your visit or need to cancel or reschedule your appointment, please contact [facility]  Dept: [phone]  and follow the prompts.  Office hours are 8:00 a.m. to 4:30 p.m. Monday - Friday. Please note that voicemails left after 4:00 p.m. may not be returned until the following business day.  We are closed weekends and major holidays. You have access to a nurse at all times for urgent questions. Please call the main number to the clinic Dept: [phone] and follow the prompts.   For any non-urgent questions, you may also contact your provider using MyChart. We now offer e-Visits for anyone 18 and older to request care online for non-urgent symptoms. For details visit [email or web address].   Also download the MyChart app! Go to the app store, search "MyChart", open the app, select [place], and log in with your MyChart username and password.   

## 2023-01-10 NOTE — Progress Notes (Signed)
Per Dr Leonides Schanz, ok to tx with ANC 0.6.

## 2023-01-10 NOTE — Progress Notes (Signed)
[facility] [phone]   [phone]  PROGRESS NOTE  Patient Care Team: Shirline Frees, NP as PCP - General (Family Medicine) Sidney Ace, MD as Referring Physician (Allergy) Jaci Standard, MD as Consulting Physician (Hematology and Oncology)  Hematological/Oncological History # Hodgkin's Lymphoma, Stage III 06/08/2022: CT neck showed Bulky adenopathy in the deep right pectoral region, recommend chest CT with contrast. There was splenomegaly on abdominal CT from the summer, consider addition of abdominal CT is well. 07/01/2023: CT chest showed Bulky RIGHT axillary lymph node enlargement, with nodal mass measuring 7 cm in length. Findings suspicious for lymphoproliferative disease 09/13/2022: US guided biopsy of right axillary lymphadenopathy was consistent with classical Hodgkin's lymphoma  10/06/2022: establish care with Dr. Leonides Schanz  10/31/2022: Cycle 1 Day 1 of A-AVD chemotherapy.  11/14/2022: Cycle 1 Day 15 of A-AVD chemotherapy 11/29/2022: Cycle 2 Day 1 of A-AVD chemotherapy 12/13/2022: Cycle 2 Day 15 of A-AVD chemotherapy (holding brentuximab and Udenyca due to worsening skin lesions from hidradenitis suppurativa) 12/19/2022: CT PET Study showed near complete metabolic response with residual 18 mm short axis right axillary node (Deauville criteria 2). 12/27/2022: Cycle 3 Day 1 of AVD chemotherapy 01/10/2023: Cycle 3 Day 15 of AVD chemotherapy   Interval History:  Lee April 42 y.o. [name] with medical history significant for stage III nodular sclerosing Hodgkin's lymphoma who presents for a follow up visit. The patient's last visit was on 12/27/2022. In the interim since the last visit he completed Cycle 3, Day 1 of A-AVD chemotherapy. He is unaccompanied for this visit.   On exam today Lee [name] reports he has been well overall in the interim since her last visit.  He does have some nausea during treatment, but it responds well to his nausea  medication.  He reports that he did have some nausea last time during the treatment itself but he thinks it was triggered by the smell of the alcohol wipes.  He reports that he continues to follow with dermatology who will see him again in 2 months time.  He notes that the skin lesions are almost completely cleared up at this time.  He notes that he tries to jog and do jumping jacks it causes him a lot of joint pain and discomfort.  He is unfortunately struggling with some occasional constipation.  He has had good success with magnesium citrate but will begin trying MiraLAX.  He denies fevers, chills, sweats, shortness of breath, chest pain or cough. He has no other complaints.  He is willing and able to proceed with treatment today.  A full 10 point ROS is otherwise negative.  MEDICAL HISTORY:  Past Medical History:  Diagnosis Date   CKD (chronic kidney disease) stage 3, GFR 30-59 ml/min (HCC) 02/20/2019   Eczema    Environmental allergies    Essential hypertension    HIV test positive (HCC)    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HCC) 07/24/2016   Shingles     SURGICAL HISTORY: Past Surgical History:  Procedure Laterality Date   CHOLECYSTECTOMY N/A 12/30/2021   Procedure: LAPAROSCOPIC CHOLECYSTECTOMY WITH  CHOLANGIOGRAM;  Surgeon: Darnell Level, MD;  Location: [facility];  Service: General;  Laterality: N/A;   IR IMAGING GUIDED PORT INSERTION  10/18/2022   LAPAROSCOPIC APPENDECTOMY N/A 03/12/2020   Procedure: APPENDECTOMY LAPAROSCOPIC;  Surgeon: Romie Levee, MD;  Location: [facility];  Service: General;  Laterality: N/A;   TYMPANOSTOMY TUBE PLACEMENT     WISDOM TOOTH EXTRACTION  WRIST SURGERY      SOCIAL HISTORY: Social History   Socioeconomic History   Marital status: Single    Spouse name: Not on file   Number of children: Not on file   Years of education: Not on file   Highest education level: Associate degree: academic program  Occupational History   Not on file  Tobacco Use    Smoking status: Never   Smokeless tobacco: Never  Vaping Use   Vaping Use: Never used  Substance and Sexual Activity   Alcohol use: No   Drug use: No   Sexual activity: Not Currently    Partners: [name]    Birth control/protection: Condom    Comment: Given Condoms  Other Topics Concern   Not on file  Social History Narrative   Works as Acupuncturist    -Not married   Social Determinants of Health   Financial Resource Strain: Low Risk  (02/09/2022)   Overall Financial Resource Strain (CARDIA)    Difficulty of Paying Living Expenses: Not hard at all  Food Insecurity: No Food Insecurity (06/27/2022)   Hunger Vital Sign    Worried About Running Out of Food in the Last Year: Never true    Ran Out of Food in the Last Year: Never true  Transportation Needs: No Transportation Needs (06/27/2022)   PRAPARE - Administrator, Civil Service (Medical): No    Lack of Transportation (Non-Medical): No  Physical Activity: Unknown (02/09/2022)   Exercise Vital Sign    Days of Exercise per Week: 0 days    Minutes of Exercise per Session: Not on file  Stress: Stress Concern Present (02/09/2022)   [employer] of Occupational Health - Occupational Stress Questionnaire    Feeling of Stress : To some extent  Social Connections: Moderately Integrated (02/09/2022)   Social Connection and Isolation Panel [NHANES]    Frequency of Communication with Friends and Family: More than three times a week    Frequency of Social Gatherings with Friends and Family: Not on file    Attends Religious Services: More than 4 times per year    Active Member of [employer] or Organizations: Yes    Attends Engineer, structural: More than 4 times per year    Marital Status: Never married  Catering manager Violence: Not on file    FAMILY HISTORY: Family History  Problem Relation Age of Onset   Hypercalcemia Mother    Hypertension Mother    Hypertension Father     ALLERGIES:  is allergic to gabapentin,  lisinopril, and nickel.  MEDICATIONS:  Current Outpatient Medications  Medication Sig Dispense Refill   acetaminophen (TYLENOL) 650 MG CR tablet Take 650 mg by mouth every 8 (eight) hours as needed for pain.     albuterol (PROVENTIL HFA;VENTOLIN HFA) 108 (90 Base) MCG/ACT inhaler Inhale 2 puffs into the lungs every 6 (six) hours as needed for wheezing or shortness of breath. 1 Inhaler 1   amLODipine (NORVASC) 10 MG tablet Take 1 tablet by mouth once daily 90 tablet 3   azelastine (ASTELIN) 0.1 % nasal spray 1-2 puffs in each nostril twice daily     azelastine (OPTIVAR) 0.05 % ophthalmic solution Apply to eye.     [name] 50-200-25 MG TABS tablet TAKE 1 TABLET DAILY 30 tablet 5   BREO ELLIPTA 200-25 MCG/ACT AEPB Inhale 1 puff into the lungs daily.     EPINEPHrine 0.3 mg/0.3 mL IJ SOAJ injection Inject 0.3 mg into the muscle once.  lidocaine-prilocaine (EMLA) cream Apply 1 Application topically as needed. 30 g 0   loratadine (CLARITIN) 10 MG tablet Take 10 mg by mouth daily.     montelukast (SINGULAIR) 10 MG tablet Take 10 mg by mouth at bedtime.     mupirocin ointment (BACTROBAN) 2 % Apply 1 Application topically 2 (two) times daily. Apply topical to the affected area twice a day until clear 30 g 3   ondansetron (ZOFRAN) 8 MG tablet Take 1 tablet (8 mg total) by mouth every 8 (eight) hours as needed. 30 tablet 0   ondansetron (ZOFRAN-ODT) 8 MG disintegrating tablet Take 1 tablet (8 mg total) by mouth every 8 (eight) hours as needed for nausea or vomiting. 30 tablet 0   oxyCODONE (OXYCONTIN) 10 mg 12 hr tablet Take 1 tablet (10 mg total) by mouth every 12 (twelve) hours. (Patient not taking: Reported on 01/10/2023) 14 tablet 0   oxyCODONE 10 MG TABS Take 1 tablet (10 mg total) by mouth every 4 (four) hours as needed for severe pain. (Patient not taking: Reported on 01/10/2023) 90 tablet 0   prochlorperazine (COMPAZINE) 10 MG tablet Take 1 tablet (10 mg total) by mouth every 6 (six) hours as  needed for nausea or vomiting. 30 tablet 0   Vitamin D, Ergocalciferol, (DRISDOL) 1.25 MG (50000 UT) CAPS capsule Take 50,000 Units by mouth once a week. Friday     No current facility-administered medications for this visit.   Facility-Administered Medications Ordered in Other Visits  Medication Dose Route Frequency Provider Last Rate Last Admin   dacarbazine (DTIC) 900 mg in sodium chloride 0.9 % 250 mL chemo infusion  900 mg Intravenous Once Ulysees Barns IV, MD 340 mL/hr at 01/10/23 1350 900 mg at 01/10/23 1350   heparin lock flush 100 unit/mL  500 Units Intracatheter Once PRN Jaci Standard, MD       sodium chloride flush (NS) 0.9 % injection 10 mL  10 mL Intracatheter PRN Jaci Standard, MD        REVIEW OF SYSTEMS:   Constitutional: ( - ) fevers, ( - )  chills , ( - ) night sweats Eyes: ( - ) blurriness of vision, ( - ) double vision, ( - ) watery eyes Ears, nose, mouth, throat, and face: ( - ) mucositis, ( - ) sore throat Respiratory: ( - ) cough, ( - ) dyspnea, ( - ) wheezes Cardiovascular: ( - ) palpitation, ( - ) chest discomfort, ( - ) lower extremity swelling Gastrointestinal:  ( - ) nausea, ( - ) heartburn, ( - ) change in bowel habits Skin: ( - ) abnormal skin rashes Lymphatics: ( - ) new lymphadenopathy, ( - ) easy bruising Neurological: ( - ) numbness, ( - ) tingling, ( - ) new weaknesses Behavioral/Psych: ( - ) mood change, ( - ) new changes  All other systems were reviewed with the patient and are negative.  PHYSICAL EXAMINATION: ECOG PERFORMANCE STATUS: 1 - Symptomatic but completely ambulatory  Vitals:   01/10/23 1100  BP: 126/77  Pulse: 88  Resp: 15  Temp: 97.8 F (36.6 C)  SpO2: 100%    Filed Weights   01/10/23 1100  Weight: 248 lb 1.6 oz (112.5 kg)      GENERAL: Well-appearing young African-American [name], alert, no distress and comfortable SKIN: skin color, texture, turgor are normal, no rashes. Multifocal subcutaneous cysts involving face,  right axilla, scalp.  EYES: conjunctiva are pink and non-injected, sclera clear  LUNGS: clear to auscultation and percussion with normal breathing effort HEART: regular rate & rhythm and no murmurs and no lower extremity edema Musculoskeletal: no cyanosis of digits and no clubbing  PSYCH: alert & oriented x 3, fluent speech NEURO: no focal motor/sensory deficits  LABORATORY DATA:  I have reviewed the data as listed    Latest Ref Rng & Units 01/10/2023   10:06 AM 12/27/2022   11:08 AM 12/13/2022    9:54 AM  CBC  WBC 4.0 - 10.5 K/uL 1.9  3.6  23.0   Hemoglobin 13.0 - 17.0 g/dL 82.9  56.2  13.0   Hematocrit 39.0 - 52.0 % 34.5  33.5  34.0   Platelets 150 - 400 K/uL 176  197  287        Latest Ref Rng & Units 01/10/2023   10:06 AM 12/27/2022   11:08 AM 12/13/2022    9:54 AM  CMP  Glucose 70 - 99 mg/dL 865  95  784   BUN 6 - 20 mg/dL 13  18  33   Creatinine 0.61 - 1.24 mg/dL 6.96  2.95  2.84   Sodium 135 - 145 mmol/L 140  140  138   Potassium 3.5 - 5.1 mmol/L 3.8  4.2  3.8   Chloride 98 - 111 mmol/L 108  108  108   CO2 22 - 32 mmol/L 26  29  23    Calcium 8.9 - 10.3 mg/dL 9.2  9.1  8.9   Total Protein 6.5 - 8.1 g/dL 6.8  7.3  7.8   Total Bilirubin 0.3 - 1.2 mg/dL 0.6  0.4  0.3   Alkaline Phos 38 - 126 U/L 65  71  132   AST 15 - 41 U/L 15  11  16    ALT 0 - 44 U/L 10  10  18       RADIOGRAPHIC STUDIES: I have personally reviewed the radiological images as listed and agreed with the findings in the report: FDG avid lymph nodes on both sides of the diaphragm, most prominently in the cervical regions and axilla.  Consistent with at least stage III disease. NM PET Image Restag (PS) Skull Base To Thigh  Result Date: 12/25/2022 CLINICAL DATA:  Subsequent treatment strategy for Hodgkin's lymphoma. EXAM: NUCLEAR MEDICINE PET SKULL BASE TO THIGH TECHNIQUE: 11.5 mCi F-18 FDG was injected intravenously. Full-ring PET imaging was performed from the skull base to thigh after the radiotracer. CT data  was obtained and used for attenuation correction and anatomic localization. Fasting blood glucose: 98 mg/dl COMPARISON:  13/24/4010 FINDINGS: Mediastinal blood pool activity: SUV max 2.1 Liver activity: SUV max 3.0 NECK: No hypermetabolic cervical lymphadenopathy. Incidental CT findings: None. CHEST: Residual 18 mm short axis right axillary node with max SUV 1.7. Additional thoracic lymphadenopathy has essentially resolved. No hypermetabolic pulmonary nodules. Right chest port terminates at the cavoatrial junction. Incidental CT findings: None. ABDOMEN/PELVIS: Spleen is top-normal in size, without focal hypermetabolism. Reference max SUV 2.2. No hypermetabolic abdominopelvic lymphadenopathy. No abnormal metabolism in the liver, pancreas, or adrenal glands. Incidental CT findings: Status post cholecystectomy. Prior appendectomy. Mild atherosclerotic calcifications of the abdominal aorta and branch vessels. SKELETON: No focal hypermetabolic activity to suggest skeletal metastasis. Incidental CT findings: None. IMPRESSION: Near complete metabolic response. Residual 18 mm short axis right axillary node (Deauville criteria 2). Electronically Signed   By: Charline Bills M.D.   On: 12/25/2022 00:23    ASSESSMENT & PLAN Lee [name] 42 y.o. [name] with medical history  significant for stage III nodular sclerosing Hodgkin's lymphoma who presents for a follow up visit.   After review of the labs, review of the records, and discussion with the patient the patients findings are most consistent with classical Hodgkin's Lymphoma.    # Hodgkin's Lymphoma, Stage III -- PET CT scan staging complete, findings consistent with stage III Hodgkin's lymphoma. --Will plan to proceed with A-AVD chemotherapy. --Echocardiogram complete, shows excellent baseline cardiac function. Plan:  --Labs today show creatinine 1.28, Hgb 11.9, MCV 87.6, Plt 176, WBC 1.9  --OK to proceed with Cycle 3 Day 15 of AVD chemotherapy today  (holding Brentuximab due to symptoms/worsening skin lesions with long acting GCSF).  --Due to worsening skin lesions that is likely flaring up from Udenyca injection, we will hold Brentuximab and Udenyca injection moving forward --Mid treatment PET CT scan on 12/19/2022 showed excellent response to treatment (Deauville 2).  --RTC in 2 weeks for Cycle 4 Day 1 of treatment.   #Hidradenitis Suppurativa--improving --Multifocal subcutenous nodules seen on physical exam --Under the care of dermatology and relates flare ups to GCSF injection --Continue with oral doxycycline and intralesional kenalog --Continue with prescribed pain regimen as needed.  #Constipation: --Likely secondary to opoid pain medication --Recommend to try OTC stool softeners such as senna or miralax.    #Supportive Care -- chemotherapy education complete -- port placed -- zofran 8mg  q8H PRN and compazine 10mg  PO q6H for nausea -- EMLA cream for port -- no pain medication required at this time.   No orders of the defined types were placed in this encounter.  All questions were answered. The patient knows to call the clinic with any problems, questions or concerns.  I have spent a total of 30 minutes minutes of face-to-face and non-face-to-face time, preparing to see the patient,performing a medically appropriate examination, counseling and educating the patient, ordering medications/tests/procedures,documenting clinical information in the electronic health record, and care coordination.   Ulysees Barns, MD Department of Hematology/Oncology [facility] Phone: [phone] Pager: [phone] Email: [email or web address]  01/10/2023 2:31 PM

## 2023-01-12 ENCOUNTER — Ambulatory Visit: Payer: BC Managed Care – PPO

## 2023-01-20 ENCOUNTER — Other Ambulatory Visit: Payer: Self-pay

## 2023-01-22 MED FILL — Dexamethasone Sodium Phosphate Inj 100 MG/10ML: INTRAMUSCULAR | Qty: 1 | Status: AC

## 2023-01-22 MED FILL — Fosaprepitant Dimeglumine For IV Infusion 150 MG (Base Eq): INTRAVENOUS | Qty: 5 | Status: AC

## 2023-01-23 ENCOUNTER — Other Ambulatory Visit: Payer: Self-pay

## 2023-01-23 ENCOUNTER — Inpatient Hospital Stay: Payer: BC Managed Care – PPO

## 2023-01-23 ENCOUNTER — Inpatient Hospital Stay: Payer: BC Managed Care – PPO | Attending: Hematology and Oncology

## 2023-01-23 ENCOUNTER — Inpatient Hospital Stay (HOSPITAL_BASED_OUTPATIENT_CLINIC_OR_DEPARTMENT_OTHER): Payer: BC Managed Care – PPO | Admitting: Physician Assistant

## 2023-01-23 DIAGNOSIS — Z95828 Presence of other vascular implants and grafts: Secondary | ICD-10-CM

## 2023-01-23 DIAGNOSIS — Z9049 Acquired absence of other specified parts of digestive tract: Secondary | ICD-10-CM | POA: Diagnosis not present

## 2023-01-23 DIAGNOSIS — Z21 Asymptomatic human immunodeficiency virus [HIV] infection status: Secondary | ICD-10-CM | POA: Diagnosis not present

## 2023-01-23 DIAGNOSIS — N183 Chronic kidney disease, stage 3 unspecified: Secondary | ICD-10-CM | POA: Insufficient documentation

## 2023-01-23 DIAGNOSIS — Z79899 Other long term (current) drug therapy: Secondary | ICD-10-CM | POA: Insufficient documentation

## 2023-01-23 DIAGNOSIS — Z888 Allergy status to other drugs, medicaments and biological substances status: Secondary | ICD-10-CM | POA: Diagnosis not present

## 2023-01-23 DIAGNOSIS — C8118 Nodular sclerosis classical Hodgkin lymphoma, lymph nodes of multiple sites: Secondary | ICD-10-CM

## 2023-01-23 DIAGNOSIS — Z5111 Encounter for antineoplastic chemotherapy: Secondary | ICD-10-CM | POA: Insufficient documentation

## 2023-01-23 DIAGNOSIS — L732 Hidradenitis suppurativa: Secondary | ICD-10-CM | POA: Insufficient documentation

## 2023-01-23 DIAGNOSIS — C8174 Other classical Hodgkin lymphoma, lymph nodes of axilla and upper limb: Secondary | ICD-10-CM | POA: Diagnosis not present

## 2023-01-23 DIAGNOSIS — G629 Polyneuropathy, unspecified: Secondary | ICD-10-CM | POA: Insufficient documentation

## 2023-01-23 DIAGNOSIS — K5900 Constipation, unspecified: Secondary | ICD-10-CM | POA: Diagnosis not present

## 2023-01-23 DIAGNOSIS — Z8249 Family history of ischemic heart disease and other diseases of the circulatory system: Secondary | ICD-10-CM | POA: Diagnosis not present

## 2023-01-23 LAB — CMP (CANCER CENTER ONLY)
ALT: 9 U/L (ref 0–44)
AST: 14 U/L — ABNORMAL LOW (ref 15–41)
Albumin: 4 g/dL (ref 3.5–5.0)
Alkaline Phosphatase: 56 U/L (ref 38–126)
Anion gap: 5 (ref 5–15)
BUN: 18 mg/dL (ref 6–20)
CO2: 27 mmol/L (ref 22–32)
Calcium: 8.8 mg/dL — ABNORMAL LOW (ref 8.9–10.3)
Chloride: 107 mmol/L (ref 98–111)
Creatinine: 1.21 mg/dL (ref 0.61–1.24)
GFR, Estimated: >60 mL/min (ref >60)
Glucose, Bld: 78 mg/dL (ref 70–99)
Potassium: 4.3 mmol/L (ref 3.5–5.1)
Sodium: 139 mmol/L (ref 135–145)
Total Bilirubin: 0.6 mg/dL (ref 0.3–1.2)
Total Protein: 6.7 g/dL (ref 6.5–8.1)

## 2023-01-23 LAB — CBC WITH DIFFERENTIAL (CANCER CENTER ONLY)
Abs Immature Granulocytes: 0.01 10*3/uL (ref 0.00–0.07)
Basophils Absolute: 0 10*3/uL (ref 0.0–0.1)
Basophils Relative: 1 %
Eosinophils Absolute: 0 10*3/uL (ref 0.0–0.5)
Eosinophils Relative: 1 %
HCT: 33.9 % — ABNORMAL LOW (ref 39.0–52.0)
Hemoglobin: 12 g/dL — ABNORMAL LOW (ref 13.0–17.0)
Immature Granulocytes: 1 %
Lymphocytes Relative: 57 %
Lymphs Abs: 1 10*3/uL (ref 0.7–4.0)
MCH: 31 pg (ref 26.0–34.0)
MCHC: 35.4 g/dL (ref 30.0–36.0)
MCV: 87.6 fL (ref 80.0–100.0)
Monocytes Absolute: 0.2 10*3/uL (ref 0.1–1.0)
Monocytes Relative: 12 %
Neutro Abs: 0.5 10*3/uL — ABNORMAL LOW (ref 1.7–7.7)
Neutrophils Relative %: 28 %
Platelet Count: 205 10*3/uL (ref 150–400)
RBC: 3.87 MIL/uL — ABNORMAL LOW (ref 4.22–5.81)
RDW: 16.3 % — ABNORMAL HIGH (ref 11.5–15.5)
WBC Count: 1.7 10*3/uL — ABNORMAL LOW (ref 4.0–10.5)
nRBC: 0 % (ref 0.0–0.2)

## 2023-01-23 MED ORDER — SODIUM CHLORIDE 0.9% FLUSH
10.0000 mL | INTRAVENOUS | Status: DC | PRN
  Administered 2023-01-23: 10 mL

## 2023-01-23 MED ORDER — SODIUM CHLORIDE 0.9 % IV SOLN
900.0000 mg | Freq: Once | INTRAVENOUS | Status: AC
  Administered 2023-01-23: 900 mg via INTRAVENOUS
  Filled 2023-01-23: qty 90

## 2023-01-23 MED ORDER — SODIUM CHLORIDE 0.9 % IV SOLN: Freq: Once | INTRAVENOUS | Status: AC

## 2023-01-23 MED ORDER — SODIUM CHLORIDE 0.9 % IV SOLN
10.0000 mg | Freq: Once | INTRAVENOUS | Status: AC
  Administered 2023-01-23: 10 mg via INTRAVENOUS
  Filled 2023-01-23: qty 10

## 2023-01-23 MED ORDER — PALONOSETRON HCL INJECTION 0.25 MG/5ML
0.2500 mg | Freq: Once | INTRAVENOUS | Status: AC
  Administered 2023-01-23: 0.25 mg via INTRAVENOUS
  Filled 2023-01-23: qty 5

## 2023-01-23 MED ORDER — VINBLASTINE SULFATE CHEMO INJECTION 1 MG/ML
6.0000 mg/m2 | Freq: Once | INTRAVENOUS | Status: AC
  Administered 2023-01-23: 14 mg via INTRAVENOUS
  Filled 2023-01-23: qty 14

## 2023-01-23 MED ORDER — ACETAMINOPHEN 325 MG PO TABS
650.0000 mg | ORAL_TABLET | Freq: Once | ORAL | Status: AC
  Administered 2023-01-23: 650 mg via ORAL
  Filled 2023-01-23: qty 2

## 2023-01-23 MED ORDER — DIPHENHYDRAMINE HCL 50 MG/ML IJ SOLN
50.0000 mg | Freq: Once | INTRAMUSCULAR | Status: AC
  Administered 2023-01-23: 50 mg via INTRAVENOUS
  Filled 2023-01-23: qty 1

## 2023-01-23 MED ORDER — SODIUM CHLORIDE 0.9 % IV SOLN
150.0000 mg | Freq: Once | INTRAVENOUS | Status: AC
  Administered 2023-01-23: 150 mg via INTRAVENOUS
  Filled 2023-01-23: qty 150

## 2023-01-23 MED ORDER — LIDOCAINE-PRILOCAINE 2.5-2.5 % EX CREA: 1.0000 | TOPICAL_CREAM | CUTANEOUS | 0 refills | Status: DC | PRN

## 2023-01-23 MED ORDER — HEPARIN SOD (PORK) LOCK FLUSH 100 UNIT/ML IV SOLN
500.0000 [IU] | Freq: Once | INTRAVENOUS | Status: AC | PRN
  Administered 2023-01-23: 500 [IU]

## 2023-01-23 MED ORDER — DOXORUBICIN HCL CHEMO IV INJECTION 2 MG/ML
25.0000 mg/m2 | Freq: Once | INTRAVENOUS | Status: AC
  Administered 2023-01-23: 58 mg via INTRAVENOUS
  Filled 2023-01-23: qty 29

## 2023-01-23 NOTE — Patient Instructions (Signed)
[facility]  Discharge Instructions: Thank you for choosing [facility] to provide your oncology and hematology care.   If you have a lab appointment with the Cancer Center, please go directly to the Cancer Center and check in at the registration area.   Wear comfortable clothing and clothing appropriate for easy access to any Portacath or PICC line.   We strive to give you quality time with your provider. You may need to reschedule your appointment if you arrive late (15 or more minutes).  Arriving late affects you and other patients whose appointments are after yours.  Also, if you miss three or more appointments without notifying the office, you may be dismissed from the clinic at the provider's discretion.      For prescription refill requests, have your pharmacy contact our office and allow 72 hours for refills to be completed.    Today you received the following chemotherapy and/or immunotherapy agents adriamycin, vincristine, dacarbazine      To help prevent nausea and vomiting after your treatment, we encourage you to take your nausea medication as directed.  BELOW ARE SYMPTOMS THAT SHOULD BE REPORTED IMMEDIATELY: *FEVER GREATER THAN 100.4 F (38 C) OR HIGHER *CHILLS OR SWEATING *NAUSEA AND VOMITING THAT IS NOT CONTROLLED WITH YOUR NAUSEA MEDICATION *UNUSUAL SHORTNESS OF BREATH *UNUSUAL BRUISING OR BLEEDING *URINARY PROBLEMS (pain or burning when urinating, or frequent urination) *BOWEL PROBLEMS (unusual diarrhea, constipation, pain near the anus) TENDERNESS IN MOUTH AND THROAT WITH OR WITHOUT PRESENCE OF ULCERS (sore throat, sores in mouth, or a toothache) UNUSUAL RASH, SWELLING OR PAIN  UNUSUAL VAGINAL DISCHARGE OR ITCHING   Items with * indicate a potential emergency and should be followed up as soon as possible or go to the Emergency Department if any problems should occur.  Please show the CHEMOTHERAPY ALERT CARD or  IMMUNOTHERAPY ALERT CARD at check-in to the Emergency Department and triage nurse.  Should you have questions after your visit or need to cancel or reschedule your appointment, please contact [facility]  Dept: [phone]  and follow the prompts.  Office hours are 8:00 a.m. to 4:30 p.m. Monday - Friday. Please note that voicemails left after 4:00 p.m. may not be returned until the following business day.  We are closed weekends and major holidays. You have access to a nurse at all times for urgent questions. Please call the main number to the clinic Dept: [phone] and follow the prompts.   For any non-urgent questions, you may also contact your provider using MyChart. We now offer e-Visits for anyone 14 and older to request care online for non-urgent symptoms. For details visit [email or web address].   Also download the MyChart app! Go to the app store, search "MyChart", open the app, select [address], and log in with your MyChart username and password.

## 2023-01-23 NOTE — Progress Notes (Signed)
[facility] Telephone:([phone]) [phone]   Fax:([phone]  PROGRESS NOTE  Patient Care Team: Shirline Frees, NP as PCP - General (Family Medicine) Sidney Ace, MD as Referring Physician (Allergy) Jaci Standard, MD as Consulting Physician (Hematology and Oncology)  Hematological/Oncological History # Hodgkin's Lymphoma, Stage III 06/08/2022: CT neck showed Bulky adenopathy in the deep right pectoral region, recommend chest CT with contrast. There was splenomegaly on abdominal CT from the summer, consider addition of abdominal CT is well. 07/01/2023: CT chest showed Bulky RIGHT axillary lymph node enlargement, with nodal mass measuring 7 cm in length. Findings suspicious for lymphoproliferative disease 09/13/2022: US guided biopsy of right axillary lymphadenopathy was consistent with classical Hodgkin's lymphoma  10/06/2022: establish care with Dr. Leonides Schanz  10/31/2022: Cycle 1 Day 1 of A-AVD chemotherapy.  11/14/2022: Cycle 1 Day 15 of A-AVD chemotherapy 11/29/2022: Cycle 2 Day 1 of A-AVD chemotherapy 12/13/2022: Cycle 2 Day 15 of A-AVD chemotherapy (holding brentuximab and Udenyca due to worsening skin lesions from hidradenitis suppurativa) 12/19/2022: CT PET Study showed near complete metabolic response with residual 18 mm short axis right axillary node (Deauville criteria 2). 12/27/2022: Cycle 3 Day 1 of AVD chemotherapy 01/10/2023: Cycle 3 Day 15 of AVD chemotherapy 01/23/2023:  Cycle 4 Day 1 of AVD chemotherapy   Interval History:  Lee [name] 42 y.o. male with medical history significant for stage III nodular sclerosing Hodgkin's lymphoma who presents for a follow up visit. The patient's last visit was on 01/10/2023. In the interim since the last visit he completed Cycle 3, Day 15 of A-AVD chemotherapy. He is unaccompanied for this visit.   On exam today Mr. Emanuel reports that he is doing well and majority of his skin lesions have nearly resolved since holding GCSF  injections. He reports his energy levels are stable and he is able to complete his ADLs on his own. He has a good appetite with noted weight gain. He deneis nausea, vomiting or abdominal pain. He still have intermittent episodes of constipation that is well managed stool softeners or laxatives. He denies easy bruising or signs of active bleeding. He denies fevers, chills, sweats, shortness of breath, chest pain or cough. He has no other complaints.  He is willing and able to proceed with treatment today.  A full 10 point ROS is otherwise negative.  MEDICAL HISTORY:  Past Medical History:  Diagnosis Date   CKD (chronic kidney disease) stage 3, GFR 30-59 ml/min (HCC) 02/20/2019   Eczema    Environmental allergies    Essential hypertension    HIV test positive (HCC)    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HCC) 07/24/2016   Shingles     SURGICAL HISTORY: Past Surgical History:  Procedure Laterality Date   CHOLECYSTECTOMY N/A 12/30/2021   Procedure: LAPAROSCOPIC CHOLECYSTECTOMY WITH  CHOLANGIOGRAM;  Surgeon: Darnell Level, MD;  Location: [facility];  Service: General;  Laterality: N/A;   IR IMAGING GUIDED PORT INSERTION  10/18/2022   LAPAROSCOPIC APPENDECTOMY N/A 03/12/2020   Procedure: APPENDECTOMY LAPAROSCOPIC;  Surgeon: Romie Levee, MD;  Location: [facility];  Service: General;  Laterality: N/A;   TYMPANOSTOMY TUBE PLACEMENT     WISDOM TOOTH EXTRACTION     WRIST SURGERY      SOCIAL HISTORY: Social History   Socioeconomic History   Marital status: Single    Spouse name: Not on file   Number of children: Not on file   Years of education: Not on file   Highest education level: Associate degree:  academic program  Occupational History   Not on file  Tobacco Use   Smoking status: Never   Smokeless tobacco: Never  Vaping Use   Vaping Use: Never used  Substance and Sexual Activity   Alcohol use: No   Drug use: No   Sexual activity: Not Currently    Partners: Male    Birth  control/protection: Condom    Comment: Given Condoms  Other Topics Concern   Not on file  Social History Narrative   Works as Acupuncturist    -Not married   Social Determinants of Health   Financial Resource Strain: Low Risk  (02/09/2022)   Overall Financial Resource Strain (CARDIA)    Difficulty of Paying Living Expenses: Not hard at all  Food Insecurity: No Food Insecurity (06/27/2022)   Hunger Vital Sign    Worried About Running Out of Food in the Last Year: Never true    Ran Out of Food in the Last Year: Never true  Transportation Needs: No Transportation Needs (06/27/2022)   PRAPARE - Administrator, Civil Service (Medical): No    Lack of Transportation (Non-Medical): No  Physical Activity: Unknown (02/09/2022)   Exercise Vital Sign    Days of Exercise per Week: 0 days    Minutes of Exercise per Session: Not on file  Stress: Stress Concern Present (02/09/2022)   [employer] of Occupational Health - Occupational Stress Questionnaire    Feeling of Stress : To some extent  Social Connections: Moderately Integrated (02/09/2022)   Social Connection and Isolation Panel [NHANES]    Frequency of Communication with Friends and Family: More than three times a week    Frequency of Social Gatherings with Friends and Family: Not on file    Attends Religious Services: More than 4 times per year    Active Member of [employer] or Organizations: Yes    Attends Engineer, structural: More than 4 times per year    Marital Status: Never married  Catering manager Violence: Not on file    FAMILY HISTORY: Family History  Problem Relation Age of Onset   Hypercalcemia Mother    Hypertension Mother    Hypertension Father     ALLERGIES:  is allergic to gabapentin, lisinopril, and nickel.  MEDICATIONS:  Current Outpatient Medications  Medication Sig Dispense Refill   acetaminophen (TYLENOL) 650 MG CR tablet Take 650 mg by mouth every 8 (eight) hours as needed for pain.      albuterol (PROVENTIL HFA;VENTOLIN HFA) 108 (90 Base) MCG/ACT inhaler Inhale 2 puffs into the lungs every 6 (six) hours as needed for wheezing or shortness of breath. 1 Inhaler 1   amLODipine (NORVASC) 10 MG tablet Take 1 tablet by mouth once daily 90 tablet 3   azelastine (ASTELIN) 0.1 % nasal spray 1-2 puffs in each nostril twice daily     azelastine (OPTIVAR) 0.05 % ophthalmic solution Apply to eye.     [name] 50-200-25 MG TABS tablet TAKE 1 TABLET DAILY 30 tablet 5   BREO ELLIPTA 200-25 MCG/ACT AEPB Inhale 1 puff into the lungs daily.     EPINEPHrine 0.3 mg/0.3 mL IJ SOAJ injection Inject 0.3 mg into the muscle once.     lidocaine-prilocaine (EMLA) cream Apply 1 Application topically as needed. 30 g 0   loratadine (CLARITIN) 10 MG tablet Take 10 mg by mouth daily.     montelukast (SINGULAIR) 10 MG tablet Take 10 mg by mouth at bedtime.     mupirocin ointment (BACTROBAN)  2 % Apply 1 Application topically 2 (two) times daily. Apply topical to the affected area twice a day until clear 30 g 3   ondansetron (ZOFRAN) 8 MG tablet Take 1 tablet (8 mg total) by mouth every 8 (eight) hours as needed. 30 tablet 0   ondansetron (ZOFRAN-ODT) 8 MG disintegrating tablet Take 1 tablet (8 mg total) by mouth every 8 (eight) hours as needed for nausea or vomiting. 30 tablet 0   oxyCODONE (OXYCONTIN) 10 mg 12 hr tablet Take 1 tablet (10 mg total) by mouth every 12 (twelve) hours. (Patient not taking: Reported on 01/10/2023) 14 tablet 0   oxyCODONE 10 MG TABS Take 1 tablet (10 mg total) by mouth every 4 (four) hours as needed for severe pain. (Patient not taking: Reported on 01/10/2023) 90 tablet 0   prochlorperazine (COMPAZINE) 10 MG tablet Take 1 tablet (10 mg total) by mouth every 6 (six) hours as needed for nausea or vomiting. 30 tablet 0   Vitamin D, Ergocalciferol, (DRISDOL) 1.25 MG (50000 UT) CAPS capsule Take 50,000 Units by mouth once a week. Friday     No current facility-administered medications for this  visit.   Facility-Administered Medications Ordered in Other Visits  Medication Dose Route Frequency Provider Last Rate Last Admin   dacarbazine (DTIC) 900 mg in sodium chloride 0.9 % 250 mL chemo infusion  900 mg Intravenous Once Ulysees Barns IV, MD 340 mL/hr at 01/10/23 1350 900 mg at 01/10/23 1350   heparin lock flush 100 unit/mL  500 Units Intracatheter Once PRN Jaci Standard, MD       sodium chloride flush (NS) 0.9 % injection 10 mL  10 mL Intracatheter PRN Jaci Standard, MD        REVIEW OF SYSTEMS:   Constitutional: ( - ) fevers, ( - )  chills , ( - ) night sweats Eyes: ( - ) blurriness of vision, ( - ) double vision, ( - ) watery eyes Ears, nose, mouth, throat, and face: ( - ) mucositis, ( - ) sore throat Respiratory: ( - ) cough, ( - ) dyspnea, ( - ) wheezes Cardiovascular: ( - ) palpitation, ( - ) chest discomfort, ( - ) lower extremity swelling Gastrointestinal:  ( - ) nausea, ( - ) heartburn, ( - ) change in bowel habits Skin: ( - ) abnormal skin rashes Lymphatics: ( - ) new lymphadenopathy, ( - ) easy bruising Neurological: ( - ) numbness, ( - ) tingling, ( - ) new weaknesses Behavioral/Psych: ( - ) mood change, ( - ) new changes  All other systems were reviewed with the patient and are negative.  PHYSICAL EXAMINATION: ECOG PERFORMANCE STATUS: 1 - Symptomatic but completely ambulatory  Vitals:   01/10/23 1100  BP: 126/77  Pulse: 88  Resp: 15  Temp: 97.8 F (36.6 C)  SpO2: 100%    Filed Weights   01/10/23 1100  Weight: 248 lb 1.6 oz (112.5 kg)      GENERAL: Well-appearing young African-American male, alert, no distress and comfortable SKIN: skin color, texture, turgor are normal, no rashes. EYES: conjunctiva are pink and non-injected, sclera clear LUNGS: clear to auscultation and percussion with normal breathing effort HEART: regular rate & rhythm and no murmurs and no lower extremity edema Musculoskeletal: no cyanosis of digits and no clubbing   PSYCH: alert & oriented x 3, fluent speech NEURO: no focal motor/sensory deficits  LABORATORY DATA:  I have reviewed the data as listed  Latest Ref Rng & Units 01/10/2023   10:06 AM 12/27/2022   11:08 AM 12/13/2022    9:54 AM  CBC  WBC 4.0 - 10.5 K/uL 1.9  3.6  23.0   Hemoglobin 13.0 - 17.0 g/dL 29.5  62.1  30.8   Hematocrit 39.0 - 52.0 % 34.5  33.5  34.0   Platelets 150 - 400 K/uL 176  197  287        Latest Ref Rng & Units 01/10/2023   10:06 AM 12/27/2022   11:08 AM 12/13/2022    9:54 AM  CMP  Glucose 70 - 99 mg/dL 657  95  846   BUN 6 - 20 mg/dL 13  18  33   Creatinine 0.61 - 1.24 mg/dL 9.62  9.52  8.41   Sodium 135 - 145 mmol/L 140  140  138   Potassium 3.5 - 5.1 mmol/L 3.8  4.2  3.8   Chloride 98 - 111 mmol/L 108  108  108   CO2 22 - 32 mmol/L 26  29  23    Calcium 8.9 - 10.3 mg/dL 9.2  9.1  8.9   Total Protein 6.5 - 8.1 g/dL 6.8  7.3  7.8   Total Bilirubin 0.3 - 1.2 mg/dL 0.6  0.4  0.3   Alkaline Phos 38 - 126 U/L 65  71  132   AST 15 - 41 U/L 15  11  16    ALT 0 - 44 U/L 10  10  18       RADIOGRAPHIC STUDIES: I have personally reviewed the radiological images as listed and agreed with the findings in the report: FDG avid lymph nodes on both sides of the diaphragm, most prominently in the cervical regions and axilla.  Consistent with at least stage III disease. NM PET Image Restag (PS) Skull Base To Thigh  Result Date: 12/25/2022 CLINICAL DATA:  Subsequent treatment strategy for Hodgkin's lymphoma. EXAM: NUCLEAR MEDICINE PET SKULL BASE TO THIGH TECHNIQUE: 11.5 mCi F-18 FDG was injected intravenously. Full-ring PET imaging was performed from the skull base to thigh after the radiotracer. CT data was obtained and used for attenuation correction and anatomic localization. Fasting blood glucose: 98 mg/dl COMPARISON:  32/44/0102 FINDINGS: Mediastinal blood pool activity: SUV max 2.1 Liver activity: SUV max 3.0 NECK: No hypermetabolic cervical lymphadenopathy. Incidental CT  findings: None. CHEST: Residual 18 mm short axis right axillary node with max SUV 1.7. Additional thoracic lymphadenopathy has essentially resolved. No hypermetabolic pulmonary nodules. Right chest port terminates at the cavoatrial junction. Incidental CT findings: None. ABDOMEN/PELVIS: Spleen is top-normal in size, without focal hypermetabolism. Reference max SUV 2.2. No hypermetabolic abdominopelvic lymphadenopathy. No abnormal metabolism in the liver, pancreas, or adrenal glands. Incidental CT findings: Status post cholecystectomy. Prior appendectomy. Mild atherosclerotic calcifications of the abdominal aorta and branch vessels. SKELETON: No focal hypermetabolic activity to suggest skeletal metastasis. Incidental CT findings: None. IMPRESSION: Near complete metabolic response. Residual 18 mm short axis right axillary node (Deauville criteria 2). Electronically Signed   By: Charline Bills M.D.   On: 12/25/2022 00:23    ASSESSMENT & PLAN Hobart Sieg 42 y.o. male with medical history significant for stage III nodular sclerosing Hodgkin's lymphoma who presents for a follow up visit.   After review of the labs, review of the records, and discussion with the patient the patients findings are most consistent with classical Hodgkin's Lymphoma.    # Hodgkin's Lymphoma, Stage III -- PET CT scan staging complete, findings consistent with stage  III Hodgkin's lymphoma. --Will plan to proceed with A-AVD chemotherapy. --Echocardiogram complete, shows excellent baseline cardiac function. --Mid treatment PET CT scan on 12/19/2022 showed excellent response to treatment (Deauville 2).  --Due to worsening skin lesions that is likely flaring up from Udenyca injection, we held Brentuximab and Udenyca injection starting Cycle 2, Day 15.  Plan:  --Labs today show Hgb 12.0, MCV 87.6, Plt 205, WBC 1.7 --Due for Cycle 4 Day 1 of AVD chemotherapy today  --Proceed with treatment without any further dose  modifications.  --RTC in 2 weeks for Cycle 4 Day 15 of treatment.   #Hidradenitis Suppurativa --Under the care of dermatology and relates flare ups to GCSF injection which has greatly improved since discontinuing GCSF.  --Continue with oral doxycycline and intralesional kenalog as needed --Continue with prescribed pain regimen as needed.  #Constipation-stable: --Likely secondary to opoid pain medication --Recommend to try OTC stool softeners such as senna or miralax.    #Supportive Care -- chemotherapy education complete -- port placed -- zofran 8mg  q8H PRN and compazine 10mg  PO q6H for nausea -- EMLA cream for port. Sent refill today.  -- no pain medication required at this time.   No orders of the defined types were placed in this encounter.  All questions were answered. The patient knows to call the clinic with any problems, questions or concerns.  I have spent a total of 30 minutes minutes of face-to-face and non-face-to-face time, preparing to see the patient,performing a medically appropriate examination, counseling and educating the patient, ordering medications/tests/procedures,documenting clinical information in the electronic health record, and care coordination.   Georga Kaufmann PA-C Dept of Hematology and Oncology [facility] Phone: [phone]   01/10/2023 2:31 PM

## 2023-01-23 NOTE — Progress Notes (Signed)
Per Georga Kaufmann, PA, ok to treat with low ANC

## 2023-01-24 ENCOUNTER — Encounter: Payer: Self-pay | Admitting: Infectious Diseases

## 2023-01-24 ENCOUNTER — Ambulatory Visit (INDEPENDENT_AMBULATORY_CARE_PROVIDER_SITE_OTHER): Payer: BC Managed Care – PPO | Admitting: Infectious Diseases

## 2023-01-24 VITALS — BP 131/68 | HR 88 | Temp 98.0°F | Ht 76.0 in | Wt 257.6 lb

## 2023-01-24 DIAGNOSIS — B20 Human immunodeficiency virus [HIV] disease: Secondary | ICD-10-CM | POA: Diagnosis not present

## 2023-01-24 DIAGNOSIS — Z79899 Other long term (current) drug therapy: Secondary | ICD-10-CM

## 2023-01-24 DIAGNOSIS — Z95828 Presence of other vascular implants and grafts: Secondary | ICD-10-CM

## 2023-01-24 DIAGNOSIS — Z113 Encounter for screening for infections with a predominantly sexual mode of transmission: Secondary | ICD-10-CM

## 2023-01-24 DIAGNOSIS — C8118 Nodular sclerosis classical Hodgkin lymphoma, lymph nodes of multiple sites: Secondary | ICD-10-CM

## 2023-01-24 NOTE — Assessment & Plan Note (Signed)
Looks clean, non-tender. Will follow

## 2023-01-24 NOTE — Assessment & Plan Note (Addendum)
He is doing well Taking art without issue Will check his labs when he is able to come to lab (closed due to holiday today) Entered CD4 and HIV RNA for his next blood draw at onc.  .  Will see him back in 6 months.

## 2023-01-24 NOTE — Assessment & Plan Note (Addendum)
Neutropenic yesterday Wearing a mask He is aware of diet precautions.  Aware of temp 100.3 to call or go to ED.  Has concerns about decreased libido due to CTX. Explained, expected to improve after chemo.

## 2023-01-24 NOTE — Progress Notes (Signed)
Subjective:    Patient ID: Lee [name], male  DOB: [birth year], 42 y.o.        MRN: [id number]   HPI 42 yo M with hx of HTN and HIV+ dx 08-03-16.  He had first visit 2-15 and was also found to have syphilis. He received IM PEN G on 09-14-16.  His genotype was naive, HLA negative.  Has been on triumeq but at 12-21-16 he had increased LFTs and Cr on labs. He had repeat labs done on 6-8 which confirmed this. He was seen in f/u with pharm 02-2017 and was changed to biktarvy.  He had emergency cholecystectomy 12-2021. 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  He started cycle 4 (01-23-23) AVD.    Feeling fatigued today after CTX yesterday, no problems with biktarvy. Feels well on his off weeks, can't stop eating. Has been going to gym when able. On steroids.  Wt up ~ 50.  He had a skin reaction to his CTX, GCSF (?), being managed by Derm.   HIV 1 RNA Quant  Date Value  02/24/2022 Not Detected Copies/mL  05/26/2021 38 Copies/mL (H)  12/05/2019 35 copies/mL (H)   CD4 T Cell Abs (/uL)  Date Value  05/26/2021 525  12/05/2019 538  02/20/2019 524     Health Maintenance  Topic Date Due  . COVID-19 Vaccine (5 - 2023-24 season) 03/24/2022  . INFLUENZA VACCINE  02/22/2023  . DTaP/Tdap/Td (8 - Td or Tdap) 02/19/2029  . Hepatitis C Screening  Completed  . HIV Screening  Completed  . HPV VACCINES  Aged Out      Review of Systems  Constitutional:  Positive for malaise/fatigue. Negative for chills, fever and weight loss.  HENT:  Negative for sore throat.        No oral ulcers  Respiratory:  Negative for cough and shortness of breath.   Gastrointestinal:  Positive for nausea. Negative for constipation, diarrhea and vomiting.  Genitourinary:  Negative for dysuria.    Please see HPI. All other systems reviewed and negative.     Objective:  Physical Exam Vitals reviewed.  Constitutional:      Appearance:  Normal appearance. He is normal weight.  HENT:     Mouth/Throat:     Mouth: Mucous membranes are moist.     Pharynx: Oropharynx is clear. No oropharyngeal exudate.  Eyes:     Extraocular Movements: Extraocular movements intact.     Pupils: Pupils are equal, round, and reactive to light.  Cardiovascular:     Rate and Rhythm: Normal rate and regular rhythm.  Pulmonary:     Effort: Pulmonary effort is normal.     Breath sounds: Normal breath sounds.  Chest:       Comments: Port is non-tender, clean, no fluctuance.  Abdominal:     General: Bowel sounds are normal. There is no distension.     Palpations: Abdomen is soft.     Tenderness: There is no abdominal tenderness.  Musculoskeletal:        General: Normal range of motion.     Cervical back: Normal range of motion and neck supple.     Right lower leg: No edema.     Left lower leg: No edema.  Lymphadenopathy:     Cervical: No cervical adenopathy.  Neurological:     Mental Status: He is alert.           Assessment & Plan:

## 2023-01-26 ENCOUNTER — Ambulatory Visit: Payer: BC Managed Care – PPO

## 2023-01-26 ENCOUNTER — Telehealth: Payer: Self-pay

## 2023-01-26 NOTE — Telephone Encounter (Signed)
Notified Patient of prior authorization approval for Lidocaine-Prilocaine 2.5% Cream. Medication is approved through 04/25/2023. No other needs or concerns voiced at this time.

## 2023-02-01 ENCOUNTER — Other Ambulatory Visit: Payer: Self-pay

## 2023-02-05 MED FILL — Fosaprepitant Dimeglumine For IV Infusion 150 MG (Base Eq): INTRAVENOUS | Qty: 5 | Status: AC

## 2023-02-05 MED FILL — Dexamethasone Sodium Phosphate Inj 100 MG/10ML: INTRAMUSCULAR | Qty: 1 | Status: AC

## 2023-02-06 ENCOUNTER — Inpatient Hospital Stay: Payer: BC Managed Care – PPO

## 2023-02-06 ENCOUNTER — Inpatient Hospital Stay (HOSPITAL_BASED_OUTPATIENT_CLINIC_OR_DEPARTMENT_OTHER): Payer: BC Managed Care – PPO | Admitting: Hematology and Oncology

## 2023-02-06 ENCOUNTER — Other Ambulatory Visit: Payer: Self-pay

## 2023-02-06 VITALS — BP 131/90 | HR 91 | Temp 97.9°F | Resp 17 | Wt 262.7 lb

## 2023-02-06 DIAGNOSIS — Z8249 Family history of ischemic heart disease and other diseases of the circulatory system: Secondary | ICD-10-CM | POA: Diagnosis not present

## 2023-02-06 DIAGNOSIS — Z95828 Presence of other vascular implants and grafts: Secondary | ICD-10-CM

## 2023-02-06 DIAGNOSIS — C8174 Other classical Hodgkin lymphoma, lymph nodes of axilla and upper limb: Secondary | ICD-10-CM | POA: Diagnosis not present

## 2023-02-06 DIAGNOSIS — C8118 Nodular sclerosis classical Hodgkin lymphoma, lymph nodes of multiple sites: Secondary | ICD-10-CM

## 2023-02-06 DIAGNOSIS — L732 Hidradenitis suppurativa: Secondary | ICD-10-CM | POA: Diagnosis not present

## 2023-02-06 DIAGNOSIS — Z888 Allergy status to other drugs, medicaments and biological substances status: Secondary | ICD-10-CM | POA: Diagnosis not present

## 2023-02-06 DIAGNOSIS — Z5111 Encounter for antineoplastic chemotherapy: Secondary | ICD-10-CM

## 2023-02-06 DIAGNOSIS — G629 Polyneuropathy, unspecified: Secondary | ICD-10-CM | POA: Diagnosis not present

## 2023-02-06 DIAGNOSIS — Z21 Asymptomatic human immunodeficiency virus [HIV] infection status: Secondary | ICD-10-CM | POA: Diagnosis not present

## 2023-02-06 DIAGNOSIS — K5900 Constipation, unspecified: Secondary | ICD-10-CM | POA: Diagnosis not present

## 2023-02-06 DIAGNOSIS — Z79899 Other long term (current) drug therapy: Secondary | ICD-10-CM | POA: Diagnosis not present

## 2023-02-06 DIAGNOSIS — N183 Chronic kidney disease, stage 3 unspecified: Secondary | ICD-10-CM | POA: Diagnosis not present

## 2023-02-06 DIAGNOSIS — Z9049 Acquired absence of other specified parts of digestive tract: Secondary | ICD-10-CM | POA: Diagnosis not present

## 2023-02-06 LAB — CMP (CANCER CENTER ONLY)
ALT: 10 U/L (ref 0–44)
AST: 15 U/L (ref 15–41)
Albumin: 4.1 g/dL (ref 3.5–5.0)
Alkaline Phosphatase: 60 U/L (ref 38–126)
Anion gap: 4 — ABNORMAL LOW (ref 5–15)
BUN: 14 mg/dL (ref 6–20)
CO2: 26 mmol/L (ref 22–32)
Calcium: 9 mg/dL (ref 8.9–10.3)
Chloride: 110 mmol/L (ref 98–111)
Creatinine: 1.23 mg/dL (ref 0.61–1.24)
GFR, Estimated: >60 mL/min (ref >60)
Glucose, Bld: 84 mg/dL (ref 70–99)
Potassium: 4.1 mmol/L (ref 3.5–5.1)
Sodium: 140 mmol/L (ref 135–145)
Total Bilirubin: 0.5 mg/dL (ref 0.3–1.2)
Total Protein: 7 g/dL (ref 6.5–8.1)

## 2023-02-06 LAB — CBC WITH DIFFERENTIAL (CANCER CENTER ONLY)
Abs Immature Granulocytes: 0.01 10*3/uL (ref 0.00–0.07)
Basophils Absolute: 0 10*3/uL (ref 0.0–0.1)
Basophils Relative: 1 %
Eosinophils Absolute: 0 10*3/uL (ref 0.0–0.5)
Eosinophils Relative: 1 %
HCT: 34.8 % — ABNORMAL LOW (ref 39.0–52.0)
Hemoglobin: 12.3 g/dL — ABNORMAL LOW (ref 13.0–17.0)
Immature Granulocytes: 1 %
Lymphocytes Relative: 51 %
Lymphs Abs: 1.1 10*3/uL (ref 0.7–4.0)
MCH: 30.8 pg (ref 26.0–34.0)
MCHC: 35.3 g/dL (ref 30.0–36.0)
MCV: 87.2 fL (ref 80.0–100.0)
Monocytes Absolute: 0.4 10*3/uL (ref 0.1–1.0)
Monocytes Relative: 20 %
Neutro Abs: 0.6 10*3/uL — ABNORMAL LOW (ref 1.7–7.7)
Neutrophils Relative %: 26 %
Platelet Count: 198 10*3/uL (ref 150–400)
RBC: 3.99 MIL/uL — ABNORMAL LOW (ref 4.22–5.81)
RDW: 15.5 % (ref 11.5–15.5)
WBC Count: 2.1 10*3/uL — ABNORMAL LOW (ref 4.0–10.5)
nRBC: 0 % (ref 0.0–0.2)

## 2023-02-06 MED ORDER — HEPARIN SOD (PORK) LOCK FLUSH 100 UNIT/ML IV SOLN
500.0000 [IU] | Freq: Once | INTRAVENOUS | Status: AC | PRN
  Administered 2023-02-06: 500 [IU]

## 2023-02-06 MED ORDER — DOXORUBICIN HCL CHEMO IV INJECTION 2 MG/ML
25.0000 mg/m2 | Freq: Once | INTRAVENOUS | Status: AC
  Administered 2023-02-06: 58 mg via INTRAVENOUS
  Filled 2023-02-06: qty 29

## 2023-02-06 MED ORDER — SODIUM CHLORIDE 0.9 % IV SOLN: Freq: Once | INTRAVENOUS | Status: AC

## 2023-02-06 MED ORDER — ACETAMINOPHEN 325 MG PO TABS
650.0000 mg | ORAL_TABLET | Freq: Once | ORAL | Status: AC
  Administered 2023-02-06: 650 mg via ORAL
  Filled 2023-02-06: qty 2

## 2023-02-06 MED ORDER — SODIUM CHLORIDE 0.9 % IV SOLN
900.0000 mg | Freq: Once | INTRAVENOUS | Status: AC
  Administered 2023-02-06: 900 mg via INTRAVENOUS
  Filled 2023-02-06: qty 90

## 2023-02-06 MED ORDER — SODIUM CHLORIDE 0.9% FLUSH
10.0000 mL | INTRAVENOUS | Status: DC | PRN
  Administered 2023-02-06: 10 mL

## 2023-02-06 MED ORDER — VINBLASTINE SULFATE CHEMO INJECTION 1 MG/ML
6.0000 mg/m2 | Freq: Once | INTRAVENOUS | Status: AC
  Administered 2023-02-06: 14 mg via INTRAVENOUS
  Filled 2023-02-06: qty 14

## 2023-02-06 MED ORDER — SODIUM CHLORIDE 0.9 % IV SOLN
10.0000 mg | Freq: Once | INTRAVENOUS | Status: AC
  Administered 2023-02-06: 10 mg via INTRAVENOUS
  Filled 2023-02-06: qty 10

## 2023-02-06 MED ORDER — SODIUM CHLORIDE 0.9 % IV SOLN
150.0000 mg | Freq: Once | INTRAVENOUS | Status: AC
  Administered 2023-02-06: 150 mg via INTRAVENOUS
  Filled 2023-02-06: qty 150

## 2023-02-06 MED ORDER — DIPHENHYDRAMINE HCL 50 MG/ML IJ SOLN
50.0000 mg | Freq: Once | INTRAMUSCULAR | Status: AC
  Administered 2023-02-06: 50 mg via INTRAVENOUS
  Filled 2023-02-06: qty 1

## 2023-02-06 MED ORDER — PALONOSETRON HCL INJECTION 0.25 MG/5ML
0.2500 mg | Freq: Once | INTRAVENOUS | Status: AC
  Administered 2023-02-06: 0.25 mg via INTRAVENOUS
  Filled 2023-02-06: qty 5

## 2023-02-06 NOTE — Progress Notes (Signed)
[facility] [phone]   [phone]  PROGRESS NOTE  Patient Care Team: Shirline Frees, NP as PCP - General (Family Medicine) Sidney Ace, MD as Referring Physician (Allergy) Jaci Standard, MD as Consulting Physician (Hematology and Oncology)  Hematological/Oncological History # Hodgkin's Lymphoma, Stage III 06/08/2022: CT neck showed Bulky adenopathy in the deep right pectoral region, recommend chest CT with contrast. There was splenomegaly on abdominal CT from the summer, consider addition of abdominal CT is well. 07/01/2023: CT chest showed Bulky RIGHT axillary lymph node enlargement, with nodal mass measuring 7 cm in length. Findings suspicious for lymphoproliferative disease 09/13/2022: US guided biopsy of right axillary lymphadenopathy was consistent with classical Hodgkin's lymphoma  10/06/2022: establish care with Dr. Leonides Schanz  10/31/2022: Cycle 1 Day 1 of A-AVD chemotherapy.  11/14/2022: Cycle 1 Day 15 of A-AVD chemotherapy 11/29/2022: Cycle 2 Day 1 of A-AVD chemotherapy 12/13/2022: Cycle 2 Day 15 of A-AVD chemotherapy (holding brentuximab and Udenyca due to worsening skin lesions from hidradenitis suppurativa) 12/19/2022: CT PET Study showed near complete metabolic response with residual 18 mm short axis right axillary node (Deauville criteria 2). 12/27/2022: Cycle 3 Day 1 of AVD chemotherapy 01/10/2023: Cycle 3 Day 15 of AVD chemotherapy 01/23/2023:  Cycle 4 Day 1 of AVD chemotherapy 02/06/2023: Cycle 4 Day 15 of AVD chemotherapy   Interval History:  Lee [name] 42 y.o. male with medical history significant for stage III nodular sclerosing Hodgkin's lymphoma who presents for a follow up visit. The patient's last visit was on 01/23/2023. In the interim since the last visit he completed Cycle 4, Day 1 of AVD chemotherapy. He is unaccompanied for this visit.   On exam today Lee [name] reports he has been feeling pretty good in the interim since  her last visit.  He reports his appetite is strong though he does still have some numbness in his hands, left greater than the right.  He notes he is doing his best to try to keep up with hydration.  He is not having any nausea, vomiting, or diarrhea although he does experience a little bit of nausea after receiving "the red devil".  He notes after his chemotherapy treatment he often goes home and "sleep it off".  He notes that he does have some difficulties with chemo brain/fogging and some ups and downs in his energy levels.  He does not had any infectious symptoms at this time although he does have an occasional night sweat.  He denies fevers, chills, sweats, shortness of breath, chest pain or cough. He has no other complaints.  He is willing and able to proceed with treatment today.  A full 10 point ROS is otherwise negative.  MEDICAL HISTORY:  Past Medical History:  Diagnosis Date   CKD (chronic kidney disease) stage 3, GFR 30-59 ml/min (HCC) 02/20/2019   Eczema    Environmental allergies    Essential hypertension    HIV test positive (HCC)    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HCC) 07/24/2016   Shingles     SURGICAL HISTORY: Past Surgical History:  Procedure Laterality Date   CHOLECYSTECTOMY N/A 12/30/2021   Procedure: LAPAROSCOPIC CHOLECYSTECTOMY WITH  CHOLANGIOGRAM;  Surgeon: Darnell Level, MD;  Location: [facility];  Service: General;  Laterality: N/A;   IR IMAGING GUIDED PORT INSERTION  10/18/2022   LAPAROSCOPIC APPENDECTOMY N/A 03/12/2020   Procedure: APPENDECTOMY LAPAROSCOPIC;  Surgeon: Romie Levee, MD;  Location: [facility];  Service: General;  Laterality: N/A;   TYMPANOSTOMY TUBE PLACEMENT  WISDOM TOOTH EXTRACTION     WRIST SURGERY      SOCIAL HISTORY: Social History   Socioeconomic History   Marital status: Single    Spouse name: Not on file   Number of children: Not on file   Years of education: Not on file   Highest education level: Associate degree: academic  program  Occupational History   Not on file  Tobacco Use   Smoking status: Never   Smokeless tobacco: Never  Vaping Use   Vaping status: Never Used  Substance and Sexual Activity   Alcohol use: No   Drug use: No   Sexual activity: Not Currently    Partners: Male    Birth control/protection: Condom    Comment: Given Condoms  Other Topics Concern   Not on file  Social History Narrative   Works as Acupuncturist    -Not married   Social Determinants of Health   Financial Resource Strain: Low Risk  (02/09/2022)   Overall Financial Resource Strain (CARDIA)    Difficulty of Paying Living Expenses: Not hard at all  Food Insecurity: No Food Insecurity (06/27/2022)   Hunger Vital Sign    Worried About Running Out of Food in the Last Year: Never true    Ran Out of Food in the Last Year: Never true  Transportation Needs: No Transportation Needs (06/27/2022)   PRAPARE - Administrator, Civil Service (Medical): No    Lack of Transportation (Non-Medical): No  Physical Activity: Unknown (02/09/2022)   Exercise Vital Sign    Days of Exercise per Week: 0 days    Minutes of Exercise per Session: Not on file  Stress: Stress Concern Present (02/09/2022)   [employer] of Occupational Health - Occupational Stress Questionnaire    Feeling of Stress : To some extent  Social Connections: Moderately Integrated (02/09/2022)   Social Connection and Isolation Panel [NHANES]    Frequency of Communication with Friends and Family: More than three times a week    Frequency of Social Gatherings with Friends and Family: Not on file    Attends Religious Services: More than 4 times per year    Active Member of [employer] or Organizations: Yes    Attends Engineer, structural: More than 4 times per year    Marital Status: Never married  Catering manager Violence: Not on file    FAMILY HISTORY: Family History  Problem Relation Age of Onset   Hypercalcemia Mother    Hypertension Mother     Hypertension Father    Prostate cancer Father    Multiple myeloma Father     ALLERGIES:  is allergic to gabapentin, lisinopril, and nickel.  MEDICATIONS:  Current Outpatient Medications  Medication Sig Dispense Refill   acetaminophen (TYLENOL) 650 MG CR tablet Take 650 mg by mouth every 8 (eight) hours as needed for pain.     albuterol (PROVENTIL HFA;VENTOLIN HFA) 108 (90 Base) MCG/ACT inhaler Inhale 2 puffs into the lungs every 6 (six) hours as needed for wheezing or shortness of breath. 1 Inhaler 1   amLODipine (NORVASC) 10 MG tablet Take 1 tablet by mouth once daily 90 tablet 3   azelastine (ASTELIN) 0.1 % nasal spray 1-2 puffs in each nostril twice daily     azelastine (OPTIVAR) 0.05 % ophthalmic solution Apply to eye.     [name] 50-200-25 MG TABS tablet TAKE 1 TABLET DAILY 30 tablet 5   BREO ELLIPTA 200-25 MCG/ACT AEPB Inhale 1 puff into the lungs daily.  EPINEPHrine 0.3 mg/0.3 mL IJ SOAJ injection Inject 0.3 mg into the muscle once.     lidocaine-prilocaine (EMLA) cream Apply 1 Application topically as needed. 30 g 0   loratadine (CLARITIN) 10 MG tablet Take 10 mg by mouth daily.     montelukast (SINGULAIR) 10 MG tablet Take 10 mg by mouth at bedtime.     mupirocin ointment (BACTROBAN) 2 % Apply 1 Application topically 2 (two) times daily. Apply topical to the affected area twice a day until clear 30 g 3   ondansetron (ZOFRAN) 8 MG tablet Take 1 tablet (8 mg total) by mouth every 8 (eight) hours as needed. (Patient not taking: Reported on 01/23/2023) 30 tablet 0   ondansetron (ZOFRAN-ODT) 8 MG disintegrating tablet Take 1 tablet (8 mg total) by mouth every 8 (eight) hours as needed for nausea or vomiting. 30 tablet 0   oxyCODONE (OXYCONTIN) 10 mg 12 hr tablet Take 1 tablet (10 mg total) by mouth every 12 (twelve) hours. 14 tablet 0   oxyCODONE 10 MG TABS Take 1 tablet (10 mg total) by mouth every 4 (four) hours as needed for severe pain. 90 tablet 0   prochlorperazine (COMPAZINE)  10 MG tablet Take 1 tablet (10 mg total) by mouth every 6 (six) hours as needed for nausea or vomiting. 30 tablet 0   Vitamin D, Ergocalciferol, (DRISDOL) 1.25 MG (50000 UT) CAPS capsule Take 50,000 Units by mouth once a week. Friday     No current facility-administered medications for this visit.    REVIEW OF SYSTEMS:   Constitutional: ( - ) fevers, ( - )  chills , ( - ) night sweats Eyes: ( - ) blurriness of vision, ( - ) double vision, ( - ) watery eyes Ears, nose, mouth, throat, and face: ( - ) mucositis, ( - ) sore throat Respiratory: ( - ) cough, ( - ) dyspnea, ( - ) wheezes Cardiovascular: ( - ) palpitation, ( - ) chest discomfort, ( - ) lower extremity swelling Gastrointestinal:  ( - ) nausea, ( - ) heartburn, ( - ) change in bowel habits Skin: ( - ) abnormal skin rashes Lymphatics: ( - ) new lymphadenopathy, ( - ) easy bruising Neurological: ( - ) numbness, ( - ) tingling, ( - ) new weaknesses Behavioral/Psych: ( - ) mood change, ( - ) new changes  All other systems were reviewed with the patient and are negative.  PHYSICAL EXAMINATION: ECOG PERFORMANCE STATUS: 1 - Symptomatic but completely ambulatory  There were no vitals filed for this visit. There were no vitals filed for this visit.  GENERAL: Well-appearing young African-American male, alert, no distress and comfortable SKIN: skin color, texture, turgor are normal, no rashes. EYES: conjunctiva are pink and non-injected, sclera clear LUNGS: clear to auscultation and percussion with normal breathing effort HEART: regular rate & rhythm and no murmurs and no lower extremity edema Musculoskeletal: no cyanosis of digits and no clubbing  PSYCH: alert & oriented x 3, fluent speech NEURO: no focal motor/sensory deficits  LABORATORY DATA:  I have reviewed the data as listed    Latest Ref Rng & Units 01/23/2023    9:05 AM 01/10/2023   10:06 AM 12/27/2022   11:08 AM  CBC  WBC 4.0 - 10.5 K/uL 1.7  1.9  3.6   Hemoglobin 13.0 -  17.0 g/dL 72.5  36.6  44.0   Hematocrit 39.0 - 52.0 % 33.9  34.5  33.5   Platelets 150 - 400 K/uL 205  176  197        Latest Ref Rng & Units 01/23/2023    9:05 AM 01/10/2023   10:06 AM 12/27/2022   11:08 AM  CMP  Glucose 70 - 99 mg/dL 78  161  95   BUN 6 - 20 mg/dL 18  13  18    Creatinine 0.61 - 1.24 mg/dL 0.96  0.45  4.09   Sodium 135 - 145 mmol/L 139  140  140   Potassium 3.5 - 5.1 mmol/L 4.3  3.8  4.2   Chloride 98 - 111 mmol/L 107  108  108   CO2 22 - 32 mmol/L 27  26  29    Calcium 8.9 - 10.3 mg/dL 8.8  9.2  9.1   Total Protein 6.5 - 8.1 g/dL 6.7  6.8  7.3   Total Bilirubin 0.3 - 1.2 mg/dL 0.6  0.6  0.4   Alkaline Phos 38 - 126 U/L 56  65  71   AST 15 - 41 U/L 14  15  11    ALT 0 - 44 U/L 9  10  10       RADIOGRAPHIC STUDIES: I have personally reviewed the radiological images as listed and agreed with the findings in the report: FDG avid lymph nodes on both sides of the diaphragm, most prominently in the cervical regions and axilla.  Consistent with at least stage III disease. No results found.  ASSESSMENT & PLAN Lee [name] 42 y.o. male with medical history significant for stage III nodular sclerosing Hodgkin's lymphoma who presents for a follow up visit.   After review of the labs, review of the records, and discussion with the patient the patients findings are most consistent with classical Hodgkin's Lymphoma.    # Hodgkin's Lymphoma, Stage III -- PET CT scan staging complete, findings consistent with stage III Hodgkin's lymphoma. --Will plan to proceed with A-AVD chemotherapy. --Echocardiogram complete, shows excellent baseline cardiac function. --Mid treatment PET CT scan on 12/19/2022 showed excellent response to treatment (Deauville 2).  --Due to worsening skin lesions that is likely flaring up from Udenyca injection, we held Brentuximab and Udenyca injection starting Cycle 2, Day 15.  Plan:  --Labs today show Hgb 12.3, white blood cell count 2.1, MCV 87.2, and  platelets of 198 --Due for Cycle 4 Day 15 of AVD chemotherapy today  --Proceed with treatment without any further dose modifications.  --RTC in 2 weeks for Cycle 5 Day 1 of treatment.   #Hidradenitis Suppurativa --Under the care of dermatology and relates flare ups to GCSF injection which has greatly improved since discontinuing GCSF.  --Continue with oral doxycycline and intralesional kenalog as needed --Continue with prescribed pain regimen as needed.  #Constipation-stable: --Likely secondary to opoid pain medication --Recommend to try OTC stool softeners such as senna or miralax.    #Supportive Care -- chemotherapy education complete -- port placed -- zofran 8mg  q8H PRN and compazine 10mg  PO q6H for nausea -- EMLA cream for port. Sent refill today.  -- no pain medication required at this time.   No orders of the defined types were placed in this encounter.  All questions were answered. The patient knows to call the clinic with any problems, questions or concerns.  I have spent a total of 30 minutes minutes of face-to-face and non-face-to-face time, preparing to see the patient,performing a medically appropriate examination, counseling and educating the patient, ordering medications/tests/procedures,documenting clinical information in the electronic health record, and care coordination.   Ulysees Barns,  MD Department of Hematology/Oncology [facility] Phone: [phone] Pager: [phone] Email: [email or web address]    02/06/2023 7:49 AM

## 2023-02-06 NOTE — Progress Notes (Signed)
Per Dr Leonides Schanz, ok to tx with ANC 0.6.

## 2023-02-19 MED FILL — Fosaprepitant Dimeglumine For IV Infusion 150 MG (Base Eq): INTRAVENOUS | Qty: 5 | Status: AC

## 2023-02-19 MED FILL — Dexamethasone Sodium Phosphate Inj 100 MG/10ML: INTRAMUSCULAR | Qty: 1 | Status: AC

## 2023-02-20 ENCOUNTER — Inpatient Hospital Stay (HOSPITAL_BASED_OUTPATIENT_CLINIC_OR_DEPARTMENT_OTHER): Payer: BC Managed Care – PPO | Admitting: Physician Assistant

## 2023-02-20 ENCOUNTER — Inpatient Hospital Stay: Payer: BC Managed Care – PPO

## 2023-02-20 VITALS — BP 136/75 | HR 95 | Temp 97.3°F | Resp 20 | Wt 270.8 lb

## 2023-02-20 DIAGNOSIS — Z8249 Family history of ischemic heart disease and other diseases of the circulatory system: Secondary | ICD-10-CM | POA: Diagnosis not present

## 2023-02-20 DIAGNOSIS — C8174 Other classical Hodgkin lymphoma, lymph nodes of axilla and upper limb: Secondary | ICD-10-CM | POA: Diagnosis not present

## 2023-02-20 DIAGNOSIS — Z888 Allergy status to other drugs, medicaments and biological substances status: Secondary | ICD-10-CM | POA: Diagnosis not present

## 2023-02-20 DIAGNOSIS — L732 Hidradenitis suppurativa: Secondary | ICD-10-CM | POA: Diagnosis not present

## 2023-02-20 DIAGNOSIS — Z5111 Encounter for antineoplastic chemotherapy: Secondary | ICD-10-CM | POA: Diagnosis not present

## 2023-02-20 DIAGNOSIS — C8118 Nodular sclerosis classical Hodgkin lymphoma, lymph nodes of multiple sites: Secondary | ICD-10-CM

## 2023-02-20 DIAGNOSIS — Z21 Asymptomatic human immunodeficiency virus [HIV] infection status: Secondary | ICD-10-CM | POA: Diagnosis not present

## 2023-02-20 DIAGNOSIS — Z79899 Other long term (current) drug therapy: Secondary | ICD-10-CM | POA: Diagnosis not present

## 2023-02-20 DIAGNOSIS — K5900 Constipation, unspecified: Secondary | ICD-10-CM | POA: Diagnosis not present

## 2023-02-20 DIAGNOSIS — G629 Polyneuropathy, unspecified: Secondary | ICD-10-CM | POA: Diagnosis not present

## 2023-02-20 DIAGNOSIS — Z95828 Presence of other vascular implants and grafts: Secondary | ICD-10-CM

## 2023-02-20 DIAGNOSIS — Z9049 Acquired absence of other specified parts of digestive tract: Secondary | ICD-10-CM | POA: Diagnosis not present

## 2023-02-20 DIAGNOSIS — N183 Chronic kidney disease, stage 3 unspecified: Secondary | ICD-10-CM | POA: Diagnosis not present

## 2023-02-20 LAB — CMP (CANCER CENTER ONLY)
ALT: 11 U/L (ref 0–44)
AST: 16 U/L (ref 15–41)
Albumin: 4 g/dL (ref 3.5–5.0)
Alkaline Phosphatase: 54 U/L (ref 38–126)
Anion gap: 6 (ref 5–15)
BUN: 15 mg/dL (ref 6–20)
CO2: 25 mmol/L (ref 22–32)
Calcium: 9 mg/dL (ref 8.9–10.3)
Chloride: 109 mmol/L (ref 98–111)
Creatinine: 1.37 mg/dL — ABNORMAL HIGH (ref 0.61–1.24)
GFR, Estimated: >60 mL/min (ref >60)
Glucose, Bld: 102 mg/dL — ABNORMAL HIGH (ref 70–99)
Potassium: 3.9 mmol/L (ref 3.5–5.1)
Sodium: 140 mmol/L (ref 135–145)
Total Bilirubin: 0.6 mg/dL (ref 0.3–1.2)
Total Protein: 6.8 g/dL (ref 6.5–8.1)

## 2023-02-20 LAB — CBC WITH DIFFERENTIAL (CANCER CENTER ONLY)
Abs Immature Granulocytes: 0 10*3/uL (ref 0.00–0.07)
Basophils Absolute: 0 10*3/uL (ref 0.0–0.1)
Basophils Relative: 1 %
Eosinophils Absolute: 0 10*3/uL (ref 0.0–0.5)
Eosinophils Relative: 2 %
HCT: 34.1 % — ABNORMAL LOW (ref 39.0–52.0)
Hemoglobin: 12.1 g/dL — ABNORMAL LOW (ref 13.0–17.0)
Immature Granulocytes: 0 %
Lymphocytes Relative: 52 %
Lymphs Abs: 1 10*3/uL (ref 0.7–4.0)
MCH: 30.7 pg (ref 26.0–34.0)
MCHC: 35.5 g/dL (ref 30.0–36.0)
MCV: 86.5 fL (ref 80.0–100.0)
Monocytes Absolute: 0.4 10*3/uL (ref 0.1–1.0)
Monocytes Relative: 19 %
Neutro Abs: 0.5 10*3/uL — ABNORMAL LOW (ref 1.7–7.7)
Neutrophils Relative %: 26 %
Platelet Count: 181 10*3/uL (ref 150–400)
RBC: 3.94 MIL/uL — ABNORMAL LOW (ref 4.22–5.81)
RDW: 14.7 % (ref 11.5–15.5)
WBC Count: 1.9 10*3/uL — ABNORMAL LOW (ref 4.0–10.5)
nRBC: 0 % (ref 0.0–0.2)

## 2023-02-20 MED ORDER — SODIUM CHLORIDE 0.9% FLUSH
10.0000 mL | INTRAVENOUS | Status: DC | PRN
  Administered 2023-02-20: 10 mL

## 2023-02-20 MED ORDER — DIPHENHYDRAMINE HCL 50 MG/ML IJ SOLN
50.0000 mg | Freq: Once | INTRAMUSCULAR | Status: AC
  Administered 2023-02-20: 50 mg via INTRAVENOUS
  Filled 2023-02-20: qty 1

## 2023-02-20 MED ORDER — SODIUM CHLORIDE 0.9 % IV SOLN: Freq: Once | INTRAVENOUS | Status: AC

## 2023-02-20 MED ORDER — PALONOSETRON HCL INJECTION 0.25 MG/5ML
0.2500 mg | Freq: Once | INTRAVENOUS | Status: AC
  Administered 2023-02-20: 0.25 mg via INTRAVENOUS
  Filled 2023-02-20: qty 5

## 2023-02-20 MED ORDER — SODIUM CHLORIDE 0.9 % IV SOLN
10.0000 mg | Freq: Once | INTRAVENOUS | Status: AC
  Administered 2023-02-20: 10 mg via INTRAVENOUS
  Filled 2023-02-20: qty 10

## 2023-02-20 MED ORDER — SODIUM CHLORIDE 0.9 % IV SOLN
150.0000 mg | Freq: Once | INTRAVENOUS | Status: AC
  Administered 2023-02-20: 150 mg via INTRAVENOUS
  Filled 2023-02-20: qty 150

## 2023-02-20 MED ORDER — VINBLASTINE SULFATE CHEMO INJECTION 1 MG/ML
6.0000 mg/m2 | Freq: Once | INTRAVENOUS | Status: AC
  Administered 2023-02-20: 14 mg via INTRAVENOUS
  Filled 2023-02-20: qty 14

## 2023-02-20 MED ORDER — DOXORUBICIN HCL CHEMO IV INJECTION 2 MG/ML
25.0000 mg/m2 | Freq: Once | INTRAVENOUS | Status: AC
  Administered 2023-02-20: 58 mg via INTRAVENOUS
  Filled 2023-02-20: qty 29

## 2023-02-20 MED ORDER — HEPARIN SOD (PORK) LOCK FLUSH 100 UNIT/ML IV SOLN
500.0000 [IU] | Freq: Once | INTRAVENOUS | Status: AC | PRN
  Administered 2023-02-20: 500 [IU]

## 2023-02-20 MED ORDER — SODIUM CHLORIDE 0.9 % IV SOLN
900.0000 mg | Freq: Once | INTRAVENOUS | Status: AC
  Administered 2023-02-20: 900 mg via INTRAVENOUS
  Filled 2023-02-20: qty 90

## 2023-02-20 MED ORDER — ACETAMINOPHEN 325 MG PO TABS
650.0000 mg | ORAL_TABLET | Freq: Once | ORAL | Status: AC
  Administered 2023-02-20: 650 mg via ORAL
  Filled 2023-02-20: qty 2

## 2023-02-20 NOTE — Progress Notes (Signed)
[facility] [phone]   [phone]  PROGRESS NOTE  Patient Care Team: Shirline Frees, NP as PCP - General (Family Medicine) Sidney Ace, MD as Referring Physician (Allergy) Jaci Standard, MD as Consulting Physician (Hematology and Oncology)  Hematological/Oncological History # Hodgkin's Lymphoma, Stage III 06/08/2022: CT neck showed Bulky adenopathy in the deep right pectoral region, recommend chest CT with contrast. There was splenomegaly on abdominal CT from the summer, consider addition of abdominal CT is well. 07/01/2023: CT chest showed Bulky RIGHT axillary lymph node enlargement, with nodal mass measuring 7 cm in length. Findings suspicious for lymphoproliferative disease 09/13/2022: US guided biopsy of right axillary lymphadenopathy was consistent with classical Hodgkin's lymphoma  10/06/2022: establish care with Dr. Leonides Schanz  10/31/2022: Cycle 1 Day 1 of A-AVD chemotherapy.  11/14/2022: Cycle 1 Day 15 of A-AVD chemotherapy 11/29/2022: Cycle 2 Day 1 of A-AVD chemotherapy 12/13/2022: Cycle 2 Day 15 of A-AVD chemotherapy (holding brentuximab and Udenyca due to worsening skin lesions from hidradenitis suppurativa) 12/19/2022: CT PET Study showed near complete metabolic response with residual 18 mm short axis right axillary node (Deauville criteria 2). 12/27/2022: Cycle 3 Day 1 of AVD chemotherapy 01/10/2023: Cycle 3 Day 15 of AVD chemotherapy 01/23/2023:  Cycle 4 Day 1 of AVD chemotherapy 02/06/2023: Cycle 4 Day 15 of AVD chemotherapy 02/20/2023: Cycle 5 Day 1 of AVD chemotherapy   Interval History:  Lee [name] 42 y.o. male with medical history significant for stage III nodular sclerosing Hodgkin's lymphoma who presents for a follow up visit. The patient's last visit was on 02/06/2023. In the interim since the last visit he completed Cycle 4, Day 15 of AVD chemotherapy. He is unaccompanied for this visit.   On exam today [name] reports he  continues to tolerate treatment without any new or concerning symptoms. His appetite and energy levels are stable. He denies nausea, vomiting or abdominal pain. He reports having bowel movements every 2-3 days  but no straining. He denies easy bruising or signs of bleeding. 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 He denies any notable issues with dexterity or grip. He reports bilateral ankle edema periodically that resolves with lower extremity elevation. He denies fevers, chills, sweats, shortness of breath, chest pain or cough. He has no other complaints.  He is willing and able to proceed with treatment today.  A full 10 point ROS is otherwise negative.  MEDICAL HISTORY:  Past Medical History:  Diagnosis Date   CKD (chronic kidney disease) stage 3, GFR 30-59 ml/min (HCC) 02/20/2019   Eczema    Environmental allergies    Essential hypertension    HIV test positive (HCC)    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HCC) 07/24/2016   Shingles     SURGICAL HISTORY: Past Surgical History:  Procedure Laterality Date   CHOLECYSTECTOMY N/A 12/30/2021   Procedure: LAPAROSCOPIC CHOLECYSTECTOMY WITH  CHOLANGIOGRAM;  Surgeon: Darnell Level, MD;  Location: [facility];  Service: General;  Laterality: N/A;   IR IMAGING GUIDED PORT INSERTION  10/18/2022   LAPAROSCOPIC APPENDECTOMY N/A 03/12/2020   Procedure: APPENDECTOMY LAPAROSCOPIC;  Surgeon: Romie Levee, MD;  Location: [facility];  Service: General;  Laterality: N/A;   TYMPANOSTOMY TUBE PLACEMENT     WISDOM TOOTH EXTRACTION     WRIST SURGERY      SOCIAL HISTORY: Social History   Socioeconomic History   Marital status: Single    Spouse name: Not on file   Number of  children: Not on file   Years of education: Not on file   Highest education level: Associate degree: academic program  Occupational History   Not on file  Tobacco Use   Smoking status: Never   Smokeless tobacco: Never  Vaping Use   Vaping  status: Never Used  Substance and Sexual Activity   Alcohol use: No   Drug use: No   Sexual activity: Not Currently    Partners: Male    Birth control/protection: Condom    Comment: Given Condoms  Other Topics Concern   Not on file  Social History Narrative   Works as Acupuncturist    -Not married   Social Determinants of Health   Financial Resource Strain: Low Risk  (02/09/2022)   Overall Financial Resource Strain (CARDIA)    Difficulty of Paying Living Expenses: Not hard at all  Food Insecurity: No Food Insecurity (06/27/2022)   Hunger Vital Sign    Worried About Running Out of Food in the Last Year: Never true    Ran Out of Food in the Last Year: Never true  Transportation Needs: No Transportation Needs (06/27/2022)   PRAPARE - Administrator, Civil Service (Medical): No    Lack of Transportation (Non-Medical): No  Physical Activity: Unknown (02/09/2022)   Exercise Vital Sign    Days of Exercise per Week: 0 days    Minutes of Exercise per Session: Not on file  Stress: Stress Concern Present (02/09/2022)   [employer] of Occupational Health - Occupational Stress Questionnaire    Feeling of Stress : To some extent  Social Connections: Moderately Integrated (02/09/2022)   Social Connection and Isolation Panel [NHANES]    Frequency of Communication with Friends and Family: More than three times a week    Frequency of Social Gatherings with Friends and Family: Not on file    Attends Religious Services: More than 4 times per year    Active Member of [employer] or Organizations: Yes    Attends Engineer, structural: More than 4 times per year    Marital Status: Never married  Catering manager Violence: Not on file    FAMILY HISTORY: Family History  Problem Relation Age of Onset   Hypercalcemia Mother    Hypertension Mother    Hypertension Father    Prostate cancer Father    Multiple myeloma Father     ALLERGIES:  is allergic to gabapentin, lisinopril, and  nickel.  MEDICATIONS:  Current Outpatient Medications  Medication Sig Dispense Refill   acetaminophen (TYLENOL) 650 MG CR tablet Take 650 mg by mouth every 8 (eight) hours as needed for pain.     albuterol (PROVENTIL HFA;VENTOLIN HFA) 108 (90 Base) MCG/ACT inhaler Inhale 2 puffs into the lungs every 6 (six) hours as needed for wheezing or shortness of breath. 1 Inhaler 1   amLODipine (NORVASC) 10 MG tablet Take 1 tablet by mouth once daily 90 tablet 3   azelastine (ASTELIN) 0.1 % nasal spray 1-2 puffs in each nostril twice daily     azelastine (OPTIVAR) 0.05 % ophthalmic solution Apply to eye.     [name] 50-200-25 MG TABS tablet TAKE 1 TABLET DAILY 30 tablet 5   BREO ELLIPTA 200-25 MCG/ACT AEPB Inhale 1 puff into the lungs daily.     EPINEPHrine 0.3 mg/0.3 mL IJ SOAJ injection Inject 0.3 mg into the muscle once.     lidocaine-prilocaine (EMLA) cream Apply 1 Application topically as needed. 30 g 0   loratadine (CLARITIN) 10  MG tablet Take 10 mg by mouth daily.     montelukast (SINGULAIR) 10 MG tablet Take 10 mg by mouth at bedtime.     mupirocin ointment (BACTROBAN) 2 % Apply 1 Application topically 2 (two) times daily. Apply topical to the affected area twice a day until clear 30 g 3   ondansetron (ZOFRAN) 8 MG tablet Take 1 tablet (8 mg total) by mouth every 8 (eight) hours as needed. 30 tablet 0   ondansetron (ZOFRAN-ODT) 8 MG disintegrating tablet Take 1 tablet (8 mg total) by mouth every 8 (eight) hours as needed for nausea or vomiting. 30 tablet 0   oxyCODONE (OXYCONTIN) 10 mg 12 hr tablet Take 1 tablet (10 mg total) by mouth every 12 (twelve) hours. 14 tablet 0   oxyCODONE 10 MG TABS Take 1 tablet (10 mg total) by mouth every 4 (four) hours as needed for severe pain. 90 tablet 0   prochlorperazine (COMPAZINE) 10 MG tablet Take 1 tablet (10 mg total) by mouth every 6 (six) hours as needed for nausea or vomiting. 30 tablet 0   Vitamin D, Ergocalciferol, (DRISDOL) 1.25 MG (50000 UT) CAPS  capsule Take 50,000 Units by mouth once a week. Friday     No current facility-administered medications for this visit.   Facility-Administered Medications Ordered in Other Visits  Medication Dose Route Frequency Provider Last Rate Last Admin   0.9 %  sodium chloride infusion   Intravenous Once Jaci Standard, MD       acetaminophen (TYLENOL) tablet 650 mg  650 mg Oral Once Ulysees Barns IV, MD       dacarbazine (DTIC) 800 mg in sodium chloride 0.9 % 250 mL chemo infusion  375 mg/m2 (Treatment Plan Recorded) Intravenous Once Jaci Standard, MD       dexamethasone (DECADRON) 10 mg in sodium chloride 0.9 % 50 mL IVPB  10 mg Intravenous Once Jaci Standard, MD       diphenhydrAMINE (BENADRYL) injection 50 mg  50 mg Intravenous Once Jaci Standard, MD       DOXOrubicin (ADRIAMYCIN) chemo injection 58 mg  25 mg/m2 (Treatment Plan Recorded) Intravenous Once Jaci Standard, MD       fosaprepitant (EMEND) 150 mg in sodium chloride 0.9 % 145 mL IVPB  150 mg Intravenous Once Ulysees Barns IV, MD       heparin lock flush 100 unit/mL  500 Units Intracatheter Once PRN Jaci Standard, MD       palonosetron (ALOXI) injection 0.25 mg  0.25 mg Intravenous Once Ulysees Barns IV, MD       sodium chloride flush (NS) 0.9 % injection 10 mL  10 mL Intracatheter PRN Jaci Standard, MD       vinBLAStine (VELBAN) 14 mg in sodium chloride 0.9 % 50 mL chemo infusion  6 mg/m2 (Treatment Plan Recorded) Intravenous Once Jaci Standard, MD        REVIEW OF SYSTEMS:   Constitutional: ( - ) fevers, ( - )  chills , ( - ) night sweats Eyes: ( - ) blurriness of vision, ( - ) double vision, ( - ) watery eyes Ears, nose, mouth, throat, and face: ( - ) mucositis, ( - ) sore throat Respiratory: ( - ) cough, ( - ) dyspnea, ( - ) wheezes Cardiovascular: ( - ) palpitation, ( - ) chest discomfort, ( - ) lower extremity swelling Gastrointestinal:  ( - )  nausea, ( - ) heartburn, ( - ) change in bowel  habits Skin: ( - ) abnormal skin rashes Lymphatics: ( - ) new lymphadenopathy, ( - ) easy bruising Neurological: ( - ) numbness, ( - ) tingling, ( - ) new weaknesses Behavioral/Psych: ( - ) mood change, ( - ) new changes  All other systems were reviewed with the patient and are negative.  PHYSICAL EXAMINATION: ECOG PERFORMANCE STATUS: 1 - Symptomatic but completely ambulatory  Vitals:   02/20/23 1057  BP: 136/75  Pulse: 95  Resp: 20  Temp: (!) 97.3 F (36.3 C)  SpO2: 100%   Filed Weights   02/20/23 1057  Weight: 270 lb 12.8 oz (122.8 kg)    GENERAL: Well-appearing young African-American male, alert, no distress and comfortable SKIN: skin color, texture, turgor are normal, no rashes. EYES: conjunctiva are pink and non-injected, sclera clear LUNGS: clear to auscultation and percussion with normal breathing effort HEART: regular rate & rhythm and no murmurs and no lower extremity edema Musculoskeletal: no cyanosis of digits and no clubbing  PSYCH: alert & oriented x 3, fluent speech NEURO: no focal motor/sensory deficits  LABORATORY DATA:  I have reviewed the data as listed    Latest Ref Rng & Units 02/20/2023   10:26 AM 02/06/2023    8:42 AM 01/23/2023    9:05 AM  CBC  WBC 4.0 - 10.5 K/uL 1.9  2.1  1.7   Hemoglobin 13.0 - 17.0 g/dL 16.1  09.6  04.5   Hematocrit 39.0 - 52.0 % 34.1  34.8  33.9   Platelets 150 - 400 K/uL 181  198  205        Latest Ref Rng & Units 02/20/2023   10:26 AM 02/06/2023    8:42 AM 01/23/2023    9:05 AM  CMP  Glucose 70 - 99 mg/dL 409  84  78   BUN 6 - 20 mg/dL 15  14  18    Creatinine 0.61 - 1.24 mg/dL 8.11  9.14  7.82   Sodium 135 - 145 mmol/L 140  140  139   Potassium 3.5 - 5.1 mmol/L 3.9  4.1  4.3   Chloride 98 - 111 mmol/L 109  110  107   CO2 22 - 32 mmol/L 25  26  27    Calcium 8.9 - 10.3 mg/dL 9.0  9.0  8.8   Total Protein 6.5 - 8.1 g/dL 6.8  7.0  6.7   Total Bilirubin 0.3 - 1.2 mg/dL 0.6  0.5  0.6   Alkaline Phos 38 - 126 U/L 54  60   56   AST 15 - 41 U/L 16  15  14    ALT 0 - 44 U/L 11  10  9       RADIOGRAPHIC STUDIES: I have personally reviewed the radiological images as listed and agreed with the findings in the report: FDG avid lymph nodes on both sides of the diaphragm, most prominently in the cervical regions and axilla.  Consistent with at least stage III disease. No results found.  ASSESSMENT & PLAN Lee [name] is a 42 y.o. male with medical history significant for stage III nodular sclerosing Hodgkin's lymphoma who presents for a follow up visit.    # Hodgkin's Lymphoma, Stage III -- PET CT scan staging complete, findings consistent with stage III Hodgkin's lymphoma. --Will plan to proceed with A-AVD chemotherapy. --Echocardiogram complete, shows excellent baseline cardiac function. --Mid treatment PET CT scan on 12/19/2022 showed excellent response to  treatment (Deauville 2).  --Due to worsening skin lesions that is likely flaring up from Udenyca injection, we held Brentuximab and Udenyca injection starting Cycle 2, Day 15.  Plan:  --Labs today show Hgb 12.1, white blood cell count 1.9, MCV 86.5, and platelets of 181. Stable but persistent neutropenia with ANC 0.5.  --Due for Cycle 5 Day 1 of AVD chemotherapy today  -- Strict neutropenic precautions given including monitor for fevers --Proceed with treatment without any further dose modifications.  --RTC in 2 weeks for Cycle 5 Day 15 of treatment.   #Hidradenitis Suppurativa--nearly resolved --Under the care of dermatology and relates flare ups to GCSF injection which has greatly improved since discontinuing GCSF.  --Continue with oral doxycycline and intralesional kenalog as needed --Continue with prescribed pain regimen as needed.  #Constipation-stable: --Likely secondary to opoid pain medication --Recommend to try OTC stool softeners such as senna or miralax.    #Supportive Care -- chemotherapy education complete -- port placed -- zofran 8mg   q8H PRN and compazine 10mg  PO q6H for nausea -- EMLA cream for port. Sent refill today.  -- no pain medication required at this time.   No orders of the defined types were placed in this encounter.  All questions were answered. The patient knows to call the clinic with any problems, questions or concerns.  I have spent a total of 30 minutes minutes of face-to-face and non-face-to-face time, preparing to see the patient,performing a medically appropriate examination, counseling and educating the patient, ordering medications/tests/procedures,documenting clinical information in the electronic health record, and care coordination.   Georga Kaufmann PA-C Dept of Hematology and Oncology [facility] Phone: [phone]    02/20/2023 11:31 AM

## 2023-02-20 NOTE — Patient Instructions (Signed)
[place] [facility]  Discharge Instructions: Thank you for choosing [facility] to provide your oncology and hematology care.   If you have a lab appointment with the Cancer Center, please go directly to the Cancer Center and check in at the registration area.   Wear comfortable clothing and clothing appropriate for easy access to any Portacath or PICC line.   We strive to give you quality time with your provider. You may need to reschedule your appointment if you arrive late (15 or more minutes).  Arriving late affects you and other patients whose appointments are after yours.  Also, if you miss three or more appointments without notifying the office, you may be dismissed from the clinic at the provider's discretion.      For prescription refill requests, have your pharmacy contact our office and allow 72 hours for refills to be completed.    Today you received the following chemotherapy and/or immunotherapy agents: Doxorubicin, Velban, Dacarbazine.       To help prevent nausea and vomiting after your treatment, we encourage you to take your nausea medication as directed.  BELOW ARE SYMPTOMS THAT SHOULD BE REPORTED IMMEDIATELY: *FEVER GREATER THAN 100.4 F (38 C) OR HIGHER *CHILLS OR SWEATING *NAUSEA AND VOMITING THAT IS NOT CONTROLLED WITH YOUR NAUSEA MEDICATION *UNUSUAL SHORTNESS OF BREATH *UNUSUAL BRUISING OR BLEEDING *URINARY PROBLEMS (pain or burning when urinating, or frequent urination) *BOWEL PROBLEMS (unusual diarrhea, constipation, pain near the anus) TENDERNESS IN MOUTH AND THROAT WITH OR WITHOUT PRESENCE OF ULCERS (sore throat, sores in mouth, or a toothache) UNUSUAL RASH, SWELLING OR PAIN  UNUSUAL VAGINAL DISCHARGE OR ITCHING   Items with * indicate a potential emergency and should be followed up as soon as possible or go to the Emergency Department if any problems should occur.  Please show the CHEMOTHERAPY ALERT CARD or  IMMUNOTHERAPY ALERT CARD at check-in to the Emergency Department and triage nurse.  Should you have questions after your visit or need to cancel or reschedule your appointment, please contact [place] [facility]  Dept: [phone]  and follow the prompts.  Office hours are 8:00 a.m. to 4:30 p.m. Monday - Friday. Please note that voicemails left after 4:00 p.m. may not be returned until the following business day.  We are closed weekends and major holidays. You have access to a nurse at all times for urgent questions. Please call the main number to the clinic Dept: [phone] and follow the prompts.   For any non-urgent questions, you may also contact your provider using MyChart. We now offer e-Visits for anyone 46 and older to request care online for non-urgent symptoms. For details visit [email or web address].   Also download the MyChart app! Go to the app store, search "MyChart", open the app, select , and log in with your MyChart username and password.

## 2023-02-20 NOTE — Progress Notes (Signed)
Per Thayil PA, ok to treat with ANC 0.5 today.

## 2023-03-02 DIAGNOSIS — J301 Allergic rhinitis due to pollen: Secondary | ICD-10-CM | POA: Diagnosis not present

## 2023-03-02 DIAGNOSIS — J3081 Allergic rhinitis due to animal (cat) (dog) hair and dander: Secondary | ICD-10-CM | POA: Diagnosis not present

## 2023-03-02 DIAGNOSIS — R059 Cough, unspecified: Secondary | ICD-10-CM | POA: Diagnosis not present

## 2023-03-02 DIAGNOSIS — J3089 Other allergic rhinitis: Secondary | ICD-10-CM | POA: Diagnosis not present

## 2023-03-06 MED FILL — Dexamethasone Sodium Phosphate Inj 100 MG/10ML: INTRAMUSCULAR | Qty: 1 | Status: AC

## 2023-03-06 MED FILL — Fosaprepitant Dimeglumine For IV Infusion 150 MG (Base Eq): INTRAVENOUS | Qty: 5 | Status: AC

## 2023-03-07 ENCOUNTER — Inpatient Hospital Stay: Payer: BC Managed Care – PPO | Attending: Hematology and Oncology

## 2023-03-07 ENCOUNTER — Ambulatory Visit: Payer: BC Managed Care – PPO | Admitting: Hematology and Oncology

## 2023-03-07 ENCOUNTER — Other Ambulatory Visit: Payer: BC Managed Care – PPO

## 2023-03-07 ENCOUNTER — Inpatient Hospital Stay (HOSPITAL_BASED_OUTPATIENT_CLINIC_OR_DEPARTMENT_OTHER): Payer: BC Managed Care – PPO | Admitting: Nurse Practitioner

## 2023-03-07 ENCOUNTER — Inpatient Hospital Stay: Payer: BC Managed Care – PPO

## 2023-03-07 ENCOUNTER — Other Ambulatory Visit: Payer: Self-pay

## 2023-03-07 VITALS — BP 127/80 | HR 96 | Temp 98.7°F | Resp 20 | Wt 279.6 lb

## 2023-03-07 DIAGNOSIS — Z5111 Encounter for antineoplastic chemotherapy: Secondary | ICD-10-CM | POA: Insufficient documentation

## 2023-03-07 DIAGNOSIS — C8118 Nodular sclerosis classical Hodgkin lymphoma, lymph nodes of multiple sites: Secondary | ICD-10-CM

## 2023-03-07 DIAGNOSIS — N183 Chronic kidney disease, stage 3 unspecified: Secondary | ICD-10-CM | POA: Insufficient documentation

## 2023-03-07 DIAGNOSIS — G629 Polyneuropathy, unspecified: Secondary | ICD-10-CM | POA: Insufficient documentation

## 2023-03-07 DIAGNOSIS — Z888 Allergy status to other drugs, medicaments and biological substances status: Secondary | ICD-10-CM | POA: Insufficient documentation

## 2023-03-07 DIAGNOSIS — C8174 Other classical Hodgkin lymphoma, lymph nodes of axilla and upper limb: Secondary | ICD-10-CM | POA: Diagnosis not present

## 2023-03-07 DIAGNOSIS — L732 Hidradenitis suppurativa: Secondary | ICD-10-CM | POA: Insufficient documentation

## 2023-03-07 DIAGNOSIS — Z95828 Presence of other vascular implants and grafts: Secondary | ICD-10-CM

## 2023-03-07 DIAGNOSIS — K5900 Constipation, unspecified: Secondary | ICD-10-CM | POA: Diagnosis not present

## 2023-03-07 DIAGNOSIS — Z8249 Family history of ischemic heart disease and other diseases of the circulatory system: Secondary | ICD-10-CM | POA: Insufficient documentation

## 2023-03-07 DIAGNOSIS — Z79899 Other long term (current) drug therapy: Secondary | ICD-10-CM | POA: Insufficient documentation

## 2023-03-07 LAB — CBC WITH DIFFERENTIAL (CANCER CENTER ONLY)
Abs Immature Granulocytes: 0 10*3/uL (ref 0.00–0.07)
Basophils Absolute: 0 10*3/uL (ref 0.0–0.1)
Basophils Relative: 1 %
Eosinophils Absolute: 0 10*3/uL (ref 0.0–0.5)
Eosinophils Relative: 2 %
HCT: 35.8 % — ABNORMAL LOW (ref 39.0–52.0)
Hemoglobin: 12.7 g/dL — ABNORMAL LOW (ref 13.0–17.0)
Immature Granulocytes: 0 %
Lymphocytes Relative: 51 %
Lymphs Abs: 1.2 10*3/uL (ref 0.7–4.0)
MCH: 30.5 pg (ref 26.0–34.0)
MCHC: 35.5 g/dL (ref 30.0–36.0)
MCV: 86.1 fL (ref 80.0–100.0)
Monocytes Absolute: 0.4 10*3/uL (ref 0.1–1.0)
Monocytes Relative: 18 %
Neutro Abs: 0.7 10*3/uL — ABNORMAL LOW (ref 1.7–7.7)
Neutrophils Relative %: 28 %
Platelet Count: 193 10*3/uL (ref 150–400)
RBC: 4.16 MIL/uL — ABNORMAL LOW (ref 4.22–5.81)
RDW: 14.7 % (ref 11.5–15.5)
WBC Count: 2.4 10*3/uL — ABNORMAL LOW (ref 4.0–10.5)
nRBC: 0 % (ref 0.0–0.2)

## 2023-03-07 LAB — CMP (CANCER CENTER ONLY)
ALT: 10 U/L (ref 0–44)
AST: 15 U/L (ref 15–41)
Albumin: 4.1 g/dL (ref 3.5–5.0)
Alkaline Phosphatase: 57 U/L (ref 38–126)
Anion gap: 3 — ABNORMAL LOW (ref 5–15)
BUN: 16 mg/dL (ref 6–20)
CO2: 28 mmol/L (ref 22–32)
Calcium: 8.8 mg/dL — ABNORMAL LOW (ref 8.9–10.3)
Chloride: 110 mmol/L (ref 98–111)
Creatinine: 1.3 mg/dL — ABNORMAL HIGH (ref 0.61–1.24)
GFR, Estimated: >60 mL/min (ref >60)
Glucose, Bld: 98 mg/dL (ref 70–99)
Potassium: 4.2 mmol/L (ref 3.5–5.1)
Sodium: 141 mmol/L (ref 135–145)
Total Bilirubin: 0.5 mg/dL (ref 0.3–1.2)
Total Protein: 7 g/dL (ref 6.5–8.1)

## 2023-03-07 MED ORDER — ACETAMINOPHEN 325 MG PO TABS
650.0000 mg | ORAL_TABLET | Freq: Once | ORAL | Status: AC
  Administered 2023-03-07: 650 mg via ORAL
  Filled 2023-03-07: qty 2

## 2023-03-07 MED ORDER — SODIUM CHLORIDE 0.9% FLUSH
10.0000 mL | INTRAVENOUS | Status: DC | PRN
  Administered 2023-03-07: 10 mL

## 2023-03-07 MED ORDER — SODIUM CHLORIDE 0.9 % IV SOLN
150.0000 mg | Freq: Once | INTRAVENOUS | Status: AC
  Administered 2023-03-07: 150 mg via INTRAVENOUS
  Filled 2023-03-07: qty 150

## 2023-03-07 MED ORDER — SODIUM CHLORIDE 0.9 % IV SOLN
10.0000 mg | Freq: Once | INTRAVENOUS | Status: AC
  Administered 2023-03-07: 10 mg via INTRAVENOUS
  Filled 2023-03-07: qty 10

## 2023-03-07 MED ORDER — SODIUM CHLORIDE 0.9 % IV SOLN
900.0000 mg | Freq: Once | INTRAVENOUS | Status: AC
  Administered 2023-03-07: 900 mg via INTRAVENOUS
  Filled 2023-03-07: qty 90

## 2023-03-07 MED ORDER — DOXORUBICIN HCL CHEMO IV INJECTION 2 MG/ML
25.0000 mg/m2 | Freq: Once | INTRAVENOUS | Status: AC
  Administered 2023-03-07: 58 mg via INTRAVENOUS
  Filled 2023-03-07: qty 29

## 2023-03-07 MED ORDER — HEPARIN SOD (PORK) LOCK FLUSH 100 UNIT/ML IV SOLN
500.0000 [IU] | Freq: Once | INTRAVENOUS | Status: AC | PRN
  Administered 2023-03-07: 500 [IU]

## 2023-03-07 MED ORDER — PALONOSETRON HCL INJECTION 0.25 MG/5ML
0.2500 mg | Freq: Once | INTRAVENOUS | Status: AC
  Administered 2023-03-07: 0.25 mg via INTRAVENOUS
  Filled 2023-03-07: qty 5

## 2023-03-07 MED ORDER — DIPHENHYDRAMINE HCL 50 MG/ML IJ SOLN
50.0000 mg | Freq: Once | INTRAMUSCULAR | Status: AC
  Administered 2023-03-07: 50 mg via INTRAVENOUS
  Filled 2023-03-07: qty 1

## 2023-03-07 MED ORDER — SODIUM CHLORIDE 0.9 % IV SOLN: Freq: Once | INTRAVENOUS | Status: AC

## 2023-03-07 MED ORDER — VINBLASTINE SULFATE CHEMO INJECTION 1 MG/ML
6.0000 mg/m2 | Freq: Once | INTRAVENOUS | Status: AC
  Administered 2023-03-07: 14 mg via INTRAVENOUS
  Filled 2023-03-07: qty 14

## 2023-03-07 NOTE — Assessment & Plan Note (Addendum)
Patient is stable and clinically doing well. -no current concerns or complaints.  -reviewed labs  --WBC 2.4; Hgb 12.7; Hct 35.8; Plt 193; ANC 0.7 --Creatinine 1.30; Ca 8.8, with remainder of CMP normal.  Plan to proceed with Cycle 5 day 15 AVD chemotherapy today  Labs/flush, follow up, and treatment as scheduled in 2 weeks

## 2023-03-07 NOTE — Progress Notes (Addendum)
Patient Care Team: Shirline Frees, NP as PCP - General (Family Medicine) Sidney Ace, MD as Referring Physician (Allergy) Jaci Standard, MD as Consulting Physician (Hematology and Oncology)  Clinic Day:  03/07/2023  Referring physician: Jaci Standard, MD  ASSESSMENT & PLAN:   Assessment & Plan: Hodgkin lymphoma [facility]) Patient is stable and clinically doing well. -no current concerns or complaints.  -reviewed labs  --WBC 2.4; Hgb 12.7; Hct 35.8; Plt 193; ANC 0.7 --Creatinine 1.30; Ca 8.8, with remainder of CMP normal.  Plan to proceed with Cycle 5 day 15 AVD chemotherapy today  Labs/flush, follow up, and treatment as scheduled in 2 weeks   Suppurative hidradenitis Patient reports overall improvement.  -managed my dermatology    Plan  Reviewed labs  -chronic and slightly improved pancytopenia.  -creatinine slightly improved at 1.30, Ca 8.8 -proceed with Cycle 5 day 15 AVD chemotherapy. -labs/flush, follow up, and treatment as currently scheduled.   The patient understands the plans discussed today and is in agreement with them.  He knows to contact our office if he develops concerns prior to his next appointment.  I provided 30 minutes of face-to-face time during this encounter and > 50% was spent counseling as documented under my assessment and plan.    Carlean Jews, NP  [place] [facility] [address] [place] [address] Dept: [phone] Dept Fax: [phone]   No orders of the defined types were placed in this encounter.     CHIEF COMPLAINT:  CC: Hodgkin's Lymphoma   Current Treatment:  AVD days 1 and 15 of 28 day cycle  INTERVAL HISTORY:  Lee [name] is here today for repeat clinical assessment. He denies fevers or chills. He denies pain. His appetite is good. His weight has increased 9 pounds over last 2 weeks .  Hodgkin's Lymphoma, Stage III 06/08/2022: CT neck showed Bulky  adenopathy in the deep right pectoral region, recommend chest CT with contrast. There was splenomegaly on abdominal CT from the summer, consider addition of abdominal CT is well. 07/01/2023: CT chest showed Bulky RIGHT axillary lymph node enlargement, with nodal mass measuring 7 cm in length. Findings suspicious for lymphoproliferative disease 09/13/2022: US guided biopsy of right axillary lymphadenopathy was consistent with classical Hodgkin's lymphoma  10/06/2022: establish care with Dr. Leonides Schanz  10/31/2022: Cycle 1 Day 1 of A-AVD chemotherapy.  11/14/2022: Cycle 1 Day 15 of A-AVD chemotherapy 11/29/2022: Cycle 2 Day 1 of A-AVD chemotherapy 12/13/2022: Cycle 2 Day 15 of A-AVD chemotherapy (holding brentuximab and Udenyca due to worsening skin lesions from hidradenitis suppurativa) 12/19/2022: CT PET Study showed near complete metabolic response with residual 18 mm short axis right axillary node (Deauville criteria 2). 12/27/2022: Cycle 3 Day 1 of AVD chemotherapy 01/10/2023: Cycle 3 Day 15 of AVD chemotherapy 01/23/2023:  Cycle 4 Day 1 of AVD chemotherapy 02/06/2023: Cycle 4 Day 15 of AVD chemotherapy 02/20/2023: Cycle 5 Day 1 of AVD chemotherapy 03/07/2023 - Cycle 5 day 15 of AVD chemotherapy    I have reviewed the past medical history, past surgical history, social history and family history with the patient and they are unchanged from previous note.  ALLERGIES:  is allergic to gabapentin, lisinopril, and nickel.  MEDICATIONS:  Current Outpatient Medications  Medication Sig Dispense Refill   acetaminophen (TYLENOL) 650 MG CR tablet Take 650 mg by mouth every 8 (eight) hours as needed for pain.     albuterol (PROVENTIL HFA;VENTOLIN HFA) 108 (  90 Base) MCG/ACT inhaler Inhale 2 puffs into the lungs every 6 (six) hours as needed for wheezing or shortness of breath. 1 Inhaler 1   amLODipine (NORVASC) 10 MG tablet Take 1 tablet by mouth once daily 90 tablet 3   azelastine (ASTELIN) 0.1 % nasal spray 1-2  puffs in each nostril twice daily     azelastine (OPTIVAR) 0.05 % ophthalmic solution Apply to eye.     [name] 50-200-25 MG TABS tablet TAKE 1 TABLET DAILY 30 tablet 5   BREO ELLIPTA 200-25 MCG/ACT AEPB Inhale 1 puff into the lungs daily.     EPINEPHrine 0.3 mg/0.3 mL IJ SOAJ injection Inject 0.3 mg into the muscle once.     lidocaine-prilocaine (EMLA) cream Apply 1 Application topically as needed. 30 g 0   loratadine (CLARITIN) 10 MG tablet Take 10 mg by mouth daily.     montelukast (SINGULAIR) 10 MG tablet Take 10 mg by mouth at bedtime.     mupirocin ointment (BACTROBAN) 2 % Apply 1 Application topically 2 (two) times daily. Apply topical to the affected area twice a day until clear 30 g 3   ondansetron (ZOFRAN) 8 MG tablet Take 1 tablet (8 mg total) by mouth every 8 (eight) hours as needed. 30 tablet 0   ondansetron (ZOFRAN-ODT) 8 MG disintegrating tablet Take 1 tablet (8 mg total) by mouth every 8 (eight) hours as needed for nausea or vomiting. 30 tablet 0   oxyCODONE (OXYCONTIN) 10 mg 12 hr tablet Take 1 tablet (10 mg total) by mouth every 12 (twelve) hours. 14 tablet 0   oxyCODONE 10 MG TABS Take 1 tablet (10 mg total) by mouth every 4 (four) hours as needed for severe pain. 90 tablet 0   prochlorperazine (COMPAZINE) 10 MG tablet Take 1 tablet (10 mg total) by mouth every 6 (six) hours as needed for nausea or vomiting. 30 tablet 0   No current facility-administered medications for this visit.   Facility-Administered Medications Ordered in Other Visits  Medication Dose Route Frequency Provider Last Rate Last Admin   heparin lock flush 100 unit/mL  500 Units Intracatheter Once PRN Ulysees Barns IV, MD       sodium chloride flush (NS) 0.9 % injection 10 mL  10 mL Intracatheter PRN Jaci Standard, MD        HISTORY OF PRESENT ILLNESS:   Oncology History  Hodgkin lymphoma (HCC)  10/23/2022 Initial Diagnosis   Hodgkin lymphoma   10/23/2022 Cancer Staging   Staging form: Hodgkin and  Non-Hodgkin Lymphoma, AJCC 8th Edition - Clinical stage from 10/23/2022: Stage III (Hodgkin lymphoma, B - Symptoms) - Signed by Jaci Standard, MD on 10/23/2022 Stage prefix: Initial diagnosis Symptoms at diagnosis (B symptoms): Fever, Weight loss, Night sweats   10/31/2022 -  Chemotherapy   Patient is on Treatment Plan : HODGKINS LYMPHOMA  A + AVD q28d         REVIEW OF SYSTEMS:   Constitutional: Denies fevers, chills or abnormal weight loss. Has had weight gain with good appetite  Eyes: Denies blurriness of vision Ears, nose, mouth, throat, and face: Denies mucositis or sore throat Respiratory: Denies cough, dyspnea or wheezes Cardiovascular: Denies palpitation, chest discomfort . Does have intermittent ankle and feet swelling which resolves with rest and propping his feet.  Gastrointestinal:  Denies nausea, heartburn or change in bowel habits Skin: Denies abnormal skin rashes Lymphatics: Denies new lymphadenopathy or easy bruising Neurological:Denies numbness, tingling or new weaknesses Behavioral/Psych: Mood  is stable, no new changes  All other systems were reviewed with the patient and are negative.   VITALS:   Today's Vitals   03/07/23 1105 03/07/23 1114  BP: 127/80   Pulse: 96   Resp: 20   Temp: 98.7 F (37.1 C)   SpO2: 99%   Weight: 279 lb 9.6 oz (126.8 kg)   PainSc:  0-No pain   Body mass index is 34.03 kg/m.   Wt Readings from Last 3 Encounters:  03/07/23 279 lb 9.6 oz (126.8 kg)  02/20/23 270 lb 12.8 oz (122.8 kg)  02/06/23 262 lb 11.2 oz (119.2 kg)    Body mass index is 34.03 kg/m.  Performance status (ECOG): 1 - symptomatic but completely ambulatory  PHYSICAL EXAM:   GENERAL:alert, no distress and comfortable SKIN: skin color, texture, turgor are normal, no rashes or significant lesions EYES: normal, Conjunctiva are pink and non-injected, sclera clear OROPHARYNX:no exudate, no erythema and lips, buccal mucosa, and tongue normal  NECK: supple,  thyroid normal size, non-tender, without nodularity LYMPH:  no palpable lymphadenopathy in the cervical, axillary or inguinal LUNGS: clear to auscultation and percussion with normal breathing effort HEART: regular rate & rhythm and no murmurs and no lower extremity edema ABDOMEN:abdomen soft, non-tender and normal bowel sounds Musculoskeletal:no cyanosis of digits and no clubbing  NEURO: alert & oriented x 3 with fluent speech, no focal motor/sensory deficits  LABORATORY DATA:  I have reviewed the data as listed    Component Value Date/Time   NA 141 03/07/2023 1038   K 4.2 03/07/2023 1038   CL 110 03/07/2023 1038   CO2 28 03/07/2023 1038   GLUCOSE 98 03/07/2023 1038   BUN 16 03/07/2023 1038   CREATININE 1.30 (H) 03/07/2023 1038   CREATININE 1.44 (H) 05/26/2021 1627   CALCIUM 8.8 (L) 03/07/2023 1038   PROT 7.0 03/07/2023 1038   ALBUMIN 4.1 03/07/2023 1038   AST 15 03/07/2023 1038   ALT 10 03/07/2023 1038   ALKPHOS 57 03/07/2023 1038   BILITOT 0.5 03/07/2023 1038   GFRNONAA >60 03/07/2023 1038   GFRNONAA 45 (L) 03/09/2017 1004   GFRAA >60 03/15/2020 0425   GFRAA 52 (L) 03/09/2017 1004    Lab Results  Component Value Date   WBC 2.4 (L) 03/07/2023   NEUTROABS 0.7 (L) 03/07/2023   HGB 12.7 (L) 03/07/2023   HCT 35.8 (L) 03/07/2023   MCV 86.1 03/07/2023   PLT 193 03/07/2023   Addendum  I have seen the patient, examined him. I agree with the assessment and and plan and have edited the notes.   Pt is here for C5D15 chem AVD, he has been tolerating well overall, in the interim scan showed excellent response.  He has persistent neutropenia, but no recent episodes of infection.  Lab reviewed, will proceed treatment at same dose today.  He knows to call us if he has any concerns after chemo.  I spent a total of 25 minutes for her visit today including reviewing his chart, and more than >50% time on face-to-face counseling.  Malachy Mood MD 03/07/2023

## 2023-03-07 NOTE — Assessment & Plan Note (Signed)
Patient reports overall improvement.  -managed my dermatology

## 2023-03-07 NOTE — Patient Instructions (Signed)
 [place] [facility] AT [facility]  Discharge Instructions: Thank you for choosing [facility] to provide your oncology and hematology care.   If you have a lab appointment with the Cancer Center, please go directly to the Cancer Center and check in at the registration area.   Wear comfortable clothing and clothing appropriate for easy access to any Portacath or PICC line.   We strive to give you quality time with your provider. You may need to reschedule your appointment if you arrive late (15 or more minutes).  Arriving late affects you and other patients whose appointments are after yours.  Also, if you miss three or more appointments without notifying the office, you may be dismissed from the clinic at the provider's discretion.      For prescription refill requests, have your pharmacy contact our office and allow 72 hours for refills to be completed.    Today you received the following chemotherapy and/or immunotherapy agents: Doxorubicin, Velban, Dacarbazine.       To help prevent nausea and vomiting after your treatment, we encourage you to take your nausea medication as directed.  BELOW ARE SYMPTOMS THAT SHOULD BE REPORTED IMMEDIATELY: *FEVER GREATER THAN 100.4 F (38 C) OR HIGHER *CHILLS OR SWEATING *NAUSEA AND VOMITING THAT IS NOT CONTROLLED WITH YOUR NAUSEA MEDICATION *UNUSUAL SHORTNESS OF BREATH *UNUSUAL BRUISING OR BLEEDING *URINARY PROBLEMS (pain or burning when urinating, or frequent urination) *BOWEL PROBLEMS (unusual diarrhea, constipation, pain near the anus) TENDERNESS IN MOUTH AND THROAT WITH OR WITHOUT PRESENCE OF ULCERS (sore throat, sores in mouth, or a toothache) UNUSUAL RASH, SWELLING OR PAIN  UNUSUAL VAGINAL DISCHARGE OR ITCHING   Items with * indicate a potential emergency and should be followed up as soon as possible or go to the Emergency Department if any problems should occur.  Please show the CHEMOTHERAPY ALERT CARD or  IMMUNOTHERAPY ALERT CARD at check-in to the Emergency Department and triage nurse.  Should you have questions after your visit or need to cancel or reschedule your appointment, please contact [place] [facility]  Dept: [phone]  and follow the prompts.  Office hours are 8:00 a.m. to 4:30 p.m. Monday - Friday. Please note that voicemails left after 4:00 p.m. may not be returned until the following business day.  We are closed weekends and major holidays. You have access to a nurse at all times for urgent questions. Please call the main number to the clinic Dept: [phone] and follow the prompts.   For any non-urgent questions, you may also contact your provider using MyChart. We now offer e-Visits for anyone 46 and older to request care online for non-urgent symptoms. For details visit [email or web address].   Also download the MyChart app! Go to the app store, search "MyChart", open the app, select , and log in with your MyChart username and password.

## 2023-03-07 NOTE — Progress Notes (Signed)
Per Herbert Seta, NP ok to treat with ANC 0.7 K/uL

## 2023-03-12 ENCOUNTER — Other Ambulatory Visit: Payer: Self-pay | Admitting: Internal Medicine

## 2023-03-12 DIAGNOSIS — B20 Human immunodeficiency virus [HIV] disease: Secondary | ICD-10-CM

## 2023-03-19 MED FILL — Fosaprepitant Dimeglumine For IV Infusion 150 MG (Base Eq): INTRAVENOUS | Qty: 5 | Status: AC

## 2023-03-19 MED FILL — Dexamethasone Sodium Phosphate Inj 100 MG/10ML: INTRAMUSCULAR | Qty: 1 | Status: AC

## 2023-03-20 ENCOUNTER — Inpatient Hospital Stay (HOSPITAL_BASED_OUTPATIENT_CLINIC_OR_DEPARTMENT_OTHER): Payer: BC Managed Care – PPO | Admitting: Physician Assistant

## 2023-03-20 ENCOUNTER — Inpatient Hospital Stay: Payer: BC Managed Care – PPO

## 2023-03-20 VITALS — BP 121/83 | HR 95 | Temp 98.0°F | Resp 17 | Wt 281.5 lb

## 2023-03-20 VITALS — BP 122/73 | HR 88 | Resp 16

## 2023-03-20 DIAGNOSIS — C8118 Nodular sclerosis classical Hodgkin lymphoma, lymph nodes of multiple sites: Secondary | ICD-10-CM

## 2023-03-20 DIAGNOSIS — Z5111 Encounter for antineoplastic chemotherapy: Secondary | ICD-10-CM

## 2023-03-20 DIAGNOSIS — Z95828 Presence of other vascular implants and grafts: Secondary | ICD-10-CM

## 2023-03-20 DIAGNOSIS — Z79899 Other long term (current) drug therapy: Secondary | ICD-10-CM | POA: Diagnosis not present

## 2023-03-20 DIAGNOSIS — N183 Chronic kidney disease, stage 3 unspecified: Secondary | ICD-10-CM | POA: Diagnosis not present

## 2023-03-20 DIAGNOSIS — C8174 Other classical Hodgkin lymphoma, lymph nodes of axilla and upper limb: Secondary | ICD-10-CM | POA: Diagnosis not present

## 2023-03-20 DIAGNOSIS — G629 Polyneuropathy, unspecified: Secondary | ICD-10-CM | POA: Diagnosis not present

## 2023-03-20 DIAGNOSIS — Z8249 Family history of ischemic heart disease and other diseases of the circulatory system: Secondary | ICD-10-CM | POA: Diagnosis not present

## 2023-03-20 DIAGNOSIS — K5900 Constipation, unspecified: Secondary | ICD-10-CM | POA: Diagnosis not present

## 2023-03-20 DIAGNOSIS — Z888 Allergy status to other drugs, medicaments and biological substances status: Secondary | ICD-10-CM | POA: Diagnosis not present

## 2023-03-20 DIAGNOSIS — L732 Hidradenitis suppurativa: Secondary | ICD-10-CM | POA: Diagnosis not present

## 2023-03-20 LAB — CMP (CANCER CENTER ONLY)
ALT: 9 U/L (ref 0–44)
AST: 14 U/L — ABNORMAL LOW (ref 15–41)
Albumin: 4.2 g/dL (ref 3.5–5.0)
Alkaline Phosphatase: 57 U/L (ref 38–126)
Anion gap: 6 (ref 5–15)
BUN: 18 mg/dL (ref 6–20)
CO2: 25 mmol/L (ref 22–32)
Calcium: 8.9 mg/dL (ref 8.9–10.3)
Chloride: 108 mmol/L (ref 98–111)
Creatinine: 1.28 mg/dL — ABNORMAL HIGH (ref 0.61–1.24)
GFR, Estimated: >60 mL/min (ref >60)
Glucose, Bld: 122 mg/dL — ABNORMAL HIGH (ref 70–99)
Potassium: 3.8 mmol/L (ref 3.5–5.1)
Sodium: 139 mmol/L (ref 135–145)
Total Bilirubin: 0.5 mg/dL (ref 0.3–1.2)
Total Protein: 7.1 g/dL (ref 6.5–8.1)

## 2023-03-20 LAB — CBC WITH DIFFERENTIAL (CANCER CENTER ONLY)
Abs Immature Granulocytes: 0 10*3/uL (ref 0.00–0.07)
Basophils Absolute: 0 10*3/uL (ref 0.0–0.1)
Basophils Relative: 1 %
Eosinophils Absolute: 0.2 10*3/uL (ref 0.0–0.5)
Eosinophils Relative: 9 %
HCT: 36.3 % — ABNORMAL LOW (ref 39.0–52.0)
Hemoglobin: 12.9 g/dL — ABNORMAL LOW (ref 13.0–17.0)
Immature Granulocytes: 0 %
Lymphocytes Relative: 45 %
Lymphs Abs: 1.1 10*3/uL (ref 0.7–4.0)
MCH: 30.4 pg (ref 26.0–34.0)
MCHC: 35.5 g/dL (ref 30.0–36.0)
MCV: 85.6 fL (ref 80.0–100.0)
Monocytes Absolute: 0.4 10*3/uL (ref 0.1–1.0)
Monocytes Relative: 14 %
Neutro Abs: 0.8 10*3/uL — ABNORMAL LOW (ref 1.7–7.7)
Neutrophils Relative %: 31 %
Platelet Count: 205 10*3/uL (ref 150–400)
RBC: 4.24 MIL/uL (ref 4.22–5.81)
RDW: 14.6 % (ref 11.5–15.5)
WBC Count: 2.4 10*3/uL — ABNORMAL LOW (ref 4.0–10.5)
nRBC: 0 % (ref 0.0–0.2)

## 2023-03-20 MED ORDER — HEPARIN SOD (PORK) LOCK FLUSH 100 UNIT/ML IV SOLN
500.0000 [IU] | Freq: Once | INTRAVENOUS | Status: AC | PRN
  Administered 2023-03-20: 500 [IU]

## 2023-03-20 MED ORDER — SODIUM CHLORIDE 0.9 % IV SOLN: Freq: Once | INTRAVENOUS | Status: AC

## 2023-03-20 MED ORDER — PALONOSETRON HCL INJECTION 0.25 MG/5ML
0.2500 mg | Freq: Once | INTRAVENOUS | Status: AC
  Administered 2023-03-20: 0.25 mg via INTRAVENOUS
  Filled 2023-03-20: qty 5

## 2023-03-20 MED ORDER — ACETAMINOPHEN 325 MG PO TABS
650.0000 mg | ORAL_TABLET | Freq: Once | ORAL | Status: AC
  Administered 2023-03-20: 650 mg via ORAL
  Filled 2023-03-20: qty 2

## 2023-03-20 MED ORDER — VINBLASTINE SULFATE CHEMO INJECTION 1 MG/ML
6.0000 mg/m2 | Freq: Once | INTRAVENOUS | Status: AC
  Administered 2023-03-20: 14 mg via INTRAVENOUS
  Filled 2023-03-20: qty 14

## 2023-03-20 MED ORDER — SODIUM CHLORIDE 0.9 % IV SOLN
900.0000 mg | Freq: Once | INTRAVENOUS | Status: AC
  Administered 2023-03-20: 900 mg via INTRAVENOUS
  Filled 2023-03-20: qty 90

## 2023-03-20 MED ORDER — SODIUM CHLORIDE 0.9% FLUSH
10.0000 mL | INTRAVENOUS | Status: DC | PRN
  Administered 2023-03-20: 10 mL

## 2023-03-20 MED ORDER — SODIUM CHLORIDE 0.9 % IV SOLN
10.0000 mg | Freq: Once | INTRAVENOUS | Status: AC
  Administered 2023-03-20: 10 mg via INTRAVENOUS
  Filled 2023-03-20: qty 10

## 2023-03-20 MED ORDER — DOXORUBICIN HCL CHEMO IV INJECTION 2 MG/ML
25.0000 mg/m2 | Freq: Once | INTRAVENOUS | Status: AC
  Administered 2023-03-20: 58 mg via INTRAVENOUS
  Filled 2023-03-20: qty 29

## 2023-03-20 MED ORDER — DIPHENHYDRAMINE HCL 50 MG/ML IJ SOLN
50.0000 mg | Freq: Once | INTRAMUSCULAR | Status: AC
  Administered 2023-03-20: 50 mg via INTRAVENOUS
  Filled 2023-03-20: qty 1

## 2023-03-20 MED ORDER — SODIUM CHLORIDE 0.9 % IV SOLN
150.0000 mg | Freq: Once | INTRAVENOUS | Status: AC
  Administered 2023-03-20: 150 mg via INTRAVENOUS
  Filled 2023-03-20: qty 150

## 2023-03-20 NOTE — Progress Notes (Signed)
[facility] [phone]   [phone]  PROGRESS NOTE  Patient Care Team: Shirline Frees, NP as PCP - General (Family Medicine) Sidney Ace, MD as Referring Physician (Allergy) Jaci Standard, MD as Consulting Physician (Hematology and Oncology)  Hematological/Oncological History # Hodgkin's Lymphoma, Stage III 06/08/2022: CT neck showed Bulky adenopathy in the deep right pectoral region, recommend chest CT with contrast. There was splenomegaly on abdominal CT from the summer, consider addition of abdominal CT is well. 07/01/2023: CT chest showed Bulky RIGHT axillary lymph node enlargement, with nodal mass measuring 7 cm in length. Findings suspicious for lymphoproliferative disease 09/13/2022: US guided biopsy of right axillary lymphadenopathy was consistent with classical Hodgkin's lymphoma  10/06/2022: establish care with Dr. Leonides Schanz  10/31/2022: Cycle 1 Day 1 of A-AVD chemotherapy.  11/14/2022: Cycle 1 Day 15 of A-AVD chemotherapy 11/29/2022: Cycle 2 Day 1 of A-AVD chemotherapy 12/13/2022: Cycle 2 Day 15 of A-AVD chemotherapy (holding brentuximab and Udenyca due to worsening skin lesions from hidradenitis suppurativa) 12/19/2022: CT PET Study showed near complete metabolic response with residual 18 mm short axis right axillary node (Deauville criteria 2). 12/27/2022: Cycle 3 Day 1 of AVD chemotherapy 01/10/2023: Cycle 3 Day 15 of AVD chemotherapy 01/23/2023:  Cycle 4 Day 1 of AVD chemotherapy 02/06/2023: Cycle 4 Day 15 of AVD chemotherapy 02/20/2023: Cycle 5 Day 1 of AVD chemotherapy 03/07/2023: Cycle 5 Day 15 of AVD chemotherapy 03/20/2023: Cycle 6 Day 1 of AVD chemotherapy     Interval History:  Lee [name] 42 y.o. male with medical history significant for stage III nodular sclerosing Hodgkin's lymphoma who presents for a follow up visit. The patient's last visit was on 03/07/2023. In the interim since the last visit he completed Cycle 5, Day 15 of  AVD chemotherapy. He is unaccompanied for this visit.   On exam today Lee [name] reports he continues to tolerate chemotherapy without significant medications.  He does have decreased energy levels for approximately 1 week after chemotherapy.  He tries to go for walks and complete his daily activities independently.  He has a great appetite and denies any weight loss.  He denies nausea, vomiting or bowel habit changes.  He denies easy bruising or signs of active bleeding.  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  He denies fevers, chills, sweats, shortness of breath, chest pain or cough.  He has no other complaints.He is willing and able to proceed with treatment today.  A full 10 point ROS is otherwise negative.  MEDICAL HISTORY:  Past Medical History:  Diagnosis Date   CKD (chronic kidney disease) stage 3, GFR 30-59 ml/min (HCC) 02/20/2019   Eczema    Environmental allergies    Essential hypertension    HIV test positive (HCC)    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HCC) 07/24/2016   Shingles     SURGICAL HISTORY: Past Surgical History:  Procedure Laterality Date   CHOLECYSTECTOMY N/A 12/30/2021   Procedure: LAPAROSCOPIC CHOLECYSTECTOMY WITH  CHOLANGIOGRAM;  Surgeon: Darnell Level, MD;  Location: [facility];  Service: General;  Laterality: N/A;   IR IMAGING GUIDED PORT INSERTION  10/18/2022   LAPAROSCOPIC APPENDECTOMY N/A 03/12/2020   Procedure: APPENDECTOMY LAPAROSCOPIC;  Surgeon: Romie Levee, MD;  Location: [facility];  Service: General;  Laterality: N/A;   TYMPANOSTOMY TUBE PLACEMENT     WISDOM TOOTH EXTRACTION     WRIST SURGERY      SOCIAL HISTORY: Social History  Socioeconomic History   Marital status: Single    Spouse name: Not on file   Number of children: Not on file   Years of education: Not on file   Highest education level: Associate degree: academic program  Occupational History   Not on file  Tobacco Use    Smoking status: Never   Smokeless tobacco: Never  Vaping Use   Vaping status: Never Used  Substance and Sexual Activity   Alcohol use: No   Drug use: No   Sexual activity: Not Currently    Partners: Male    Birth control/protection: Condom    Comment: Given Condoms  Other Topics Concern   Not on file  Social History Narrative   Works as Acupuncturist    -Not married   Social Determinants of Health   Financial Resource Strain: Low Risk  (02/09/2022)   Overall Financial Resource Strain (CARDIA)    Difficulty of Paying Living Expenses: Not hard at all  Food Insecurity: No Food Insecurity (06/27/2022)   Hunger Vital Sign    Worried About Running Out of Food in the Last Year: Never true    Ran Out of Food in the Last Year: Never true  Transportation Needs: No Transportation Needs (06/27/2022)   PRAPARE - Administrator, Civil Service (Medical): No    Lack of Transportation (Non-Medical): No  Physical Activity: Unknown (02/09/2022)   Exercise Vital Sign    Days of Exercise per Week: 0 days    Minutes of Exercise per Session: Not on file  Stress: Stress Concern Present (02/09/2022)   [employer] of Occupational Health - Occupational Stress Questionnaire    Feeling of Stress : To some extent  Social Connections: Moderately Integrated (02/09/2022)   Social Connection and Isolation Panel [NHANES]    Frequency of Communication with Friends and Family: More than three times a week    Frequency of Social Gatherings with Friends and Family: Not on file    Attends Religious Services: More than 4 times per year    Active Member of [employer] or Organizations: Yes    Attends Engineer, structural: More than 4 times per year    Marital Status: Never married  Catering manager Violence: Not on file    FAMILY HISTORY: Family History  Problem Relation Age of Onset   Hypercalcemia Mother    Hypertension Mother    Hypertension Father    Prostate cancer Father    Multiple myeloma  Father     ALLERGIES:  is allergic to gabapentin, lisinopril, and nickel.  MEDICATIONS:  Current Outpatient Medications  Medication Sig Dispense Refill   acetaminophen (TYLENOL) 650 MG CR tablet Take 650 mg by mouth every 8 (eight) hours as needed for pain.     albuterol (PROVENTIL HFA;VENTOLIN HFA) 108 (90 Base) MCG/ACT inhaler Inhale 2 puffs into the lungs every 6 (six) hours as needed for wheezing or shortness of breath. 1 Inhaler 1   amLODipine (NORVASC) 10 MG tablet Take 1 tablet by mouth once daily 90 tablet 3   azelastine (ASTELIN) 0.1 % nasal spray 1-2 puffs in each nostril twice daily     azelastine (OPTIVAR) 0.05 % ophthalmic solution Apply to eye.     [name] 50-200-25 MG TABS tablet TAKE 1 TABLET DAILY 30 tablet 5   BREO ELLIPTA 200-25 MCG/ACT AEPB Inhale 1 puff into the lungs daily.     EPINEPHrine 0.3 mg/0.3 mL IJ SOAJ injection Inject 0.3 mg into the muscle once.  lidocaine-prilocaine (EMLA) cream Apply 1 Application topically as needed. 30 g 0   loratadine (CLARITIN) 10 MG tablet Take 10 mg by mouth daily.     montelukast (SINGULAIR) 10 MG tablet Take 10 mg by mouth at bedtime.     mupirocin ointment (BACTROBAN) 2 % Apply 1 Application topically 2 (two) times daily. Apply topical to the affected area twice a day until clear 30 g 3   ondansetron (ZOFRAN) 8 MG tablet Take 1 tablet (8 mg total) by mouth every 8 (eight) hours as needed. 30 tablet 0   ondansetron (ZOFRAN-ODT) 8 MG disintegrating tablet Take 1 tablet (8 mg total) by mouth every 8 (eight) hours as needed for nausea or vomiting. 30 tablet 0   oxyCODONE (OXYCONTIN) 10 mg 12 hr tablet Take 1 tablet (10 mg total) by mouth every 12 (twelve) hours. 14 tablet 0   oxyCODONE 10 MG TABS Take 1 tablet (10 mg total) by mouth every 4 (four) hours as needed for severe pain. 90 tablet 0   prochlorperazine (COMPAZINE) 10 MG tablet Take 1 tablet (10 mg total) by mouth every 6 (six) hours as needed for nausea or vomiting. 30  tablet 0   No current facility-administered medications for this visit.    REVIEW OF SYSTEMS:   Constitutional: ( - ) fevers, ( - )  chills , ( - ) night sweats Eyes: ( - ) blurriness of vision, ( - ) double vision, ( - ) watery eyes Ears, nose, mouth, throat, and face: ( - ) mucositis, ( - ) sore throat Respiratory: ( - ) cough, ( - ) dyspnea, ( - ) wheezes Cardiovascular: ( - ) palpitation, ( - ) chest discomfort, ( - ) lower extremity swelling Gastrointestinal:  ( - ) nausea, ( - ) heartburn, ( - ) change in bowel habits Skin: ( - ) abnormal skin rashes Lymphatics: ( - ) new lymphadenopathy, ( - ) easy bruising Neurological: ( - ) numbness, ( - ) tingling, ( - ) new weaknesses Behavioral/Psych: ( - ) mood change, ( - ) new changes  All other systems were reviewed with the patient and are negative.  PHYSICAL EXAMINATION: ECOG PERFORMANCE STATUS: 1 - Symptomatic but completely ambulatory  Vitals:   03/20/23 1102  BP: 121/83  Pulse: 95  Resp: 17  Temp: 98 F (36.7 C)  SpO2: 96%   Filed Weights   03/20/23 1102  Weight: 281 lb 8 oz (127.7 kg)    GENERAL: Well-appearing young African-American male, alert, no distress and comfortable SKIN: skin color, texture, turgor are normal, no rashes. EYES: conjunctiva are pink and non-injected, sclera clear LUNGS: clear to auscultation and percussion with normal breathing effort HEART: regular rate & rhythm and no murmurs and no lower extremity edema Musculoskeletal: no cyanosis of digits and no clubbing  PSYCH: alert & oriented x 3, fluent speech NEURO: no focal motor/sensory deficits  LABORATORY DATA:  I have reviewed the data as listed    Latest Ref Rng & Units 03/20/2023   10:32 AM 03/07/2023   10:38 AM 02/20/2023   10:26 AM  CBC  WBC 4.0 - 10.5 K/uL 2.4  2.4  1.9   Hemoglobin 13.0 - 17.0 g/dL 16.1  09.6  04.5   Hematocrit 39.0 - 52.0 % 36.3  35.8  34.1   Platelets 150 - 400 K/uL 205  193  181        Latest Ref Rng &  Units 03/20/2023   10:32 AM 03/07/2023  10:38 AM 02/20/2023   10:26 AM  CMP  Glucose 70 - 99 mg/dL 528  98  413   BUN 6 - 20 mg/dL 18  16  15    Creatinine 0.61 - 1.24 mg/dL 2.44  0.10  2.72   Sodium 135 - 145 mmol/L 139  141  140   Potassium 3.5 - 5.1 mmol/L 3.8  4.2  3.9   Chloride 98 - 111 mmol/L 108  110  109   CO2 22 - 32 mmol/L 25  28  25    Calcium 8.9 - 10.3 mg/dL 8.9  8.8  9.0   Total Protein 6.5 - 8.1 g/dL 7.1  7.0  6.8   Total Bilirubin 0.3 - 1.2 mg/dL 0.5  0.5  0.6   Alkaline Phos 38 - 126 U/L 57  57  54   AST 15 - 41 U/L 14  15  16    ALT 0 - 44 U/L 9  10  11       RADIOGRAPHIC STUDIES: I have personally reviewed the radiological images as listed and agreed with the findings in the report: FDG avid lymph nodes on both sides of the diaphragm, most prominently in the cervical regions and axilla.  Consistent with at least stage III disease. No results found.  ASSESSMENT & PLAN Lee [name] is a 42 y.o. male with medical history significant for stage III nodular sclerosing Hodgkin's lymphoma who presents for a follow up visit.    # Hodgkin's Lymphoma, Stage III -- PET CT scan staging complete, findings consistent with stage III Hodgkin's lymphoma. --Will plan to proceed with A-AVD chemotherapy. --Echocardiogram complete, shows excellent baseline cardiac function. --Mid treatment PET CT scan on 12/19/2022 showed excellent response to treatment (Deauville 2).  --Due to worsening skin lesions that is likely flaring up from Udenyca injection, we held Brentuximab and Udenyca injection starting Cycle 2, Day 15.  Plan:  --Labs today show Hgb 12.9, white blood cell count 2.4, MCV 85.6, and platelets of 205. Stable but persistent neutropenia with ANC 0.8. Creatinine is stable at 1.28, LFTs normal.  --Due for Cycle 6 Day 1 of AVD chemotherapy today  --Strict neutropenic precautions given including monitor for fevers --Proceed with treatment without any further dose modifications.   --RTC in 2 weeks for Cycle 6 Day 15 of treatment.   #Hidradenitis Suppurativa--nearly resolved --Under the care of dermatology and relates flare ups to GCSF injection which has greatly improved since discontinuing GCSF.  --Continue with oral doxycycline and intralesional kenalog as needed  #Constipation-stable: --Likely secondary to opoid pain medication --Recommend to try OTC stool softeners such as senna or miralax.    #Supportive Care -- chemotherapy education complete -- port placed -- zofran 8mg  q8H PRN and compazine 10mg  PO q6H for nausea -- EMLA cream for port.  -- no pain medication required at this time.   No orders of the defined types were placed in this encounter.  All questions were answered. The patient knows to call the clinic with any problems, questions or concerns.  I have spent a total of 30 minutes minutes of face-to-face and non-face-to-face time, preparing to see the patient,performing a medically appropriate examination, counseling and educating the patient, ordering medications/tests/procedures,documenting clinical information in the electronic health record, and care coordination.   Georga Kaufmann PA-C Dept of Hematology and Oncology [facility] Phone: [phone]    03/20/2023 11:25 AM

## 2023-03-20 NOTE — Patient Instructions (Signed)
 [place] [facility] AT [facility]  Discharge Instructions: Thank you for choosing [facility] to provide your oncology and hematology care.   If you have a lab appointment with the Cancer Center, please go directly to the Cancer Center and check in at the registration area.   Wear comfortable clothing and clothing appropriate for easy access to any Portacath or PICC line.   We strive to give you quality time with your provider. You may need to reschedule your appointment if you arrive late (15 or more minutes).  Arriving late affects you and other patients whose appointments are after yours.  Also, if you miss three or more appointments without notifying the office, you may be dismissed from the clinic at the provider's discretion.      For prescription refill requests, have your pharmacy contact our office and allow 72 hours for refills to be completed.    Today you received the following chemotherapy and/or immunotherapy agents: Doxorubicin, Velban, Dacarbazine.       To help prevent nausea and vomiting after your treatment, we encourage you to take your nausea medication as directed.  BELOW ARE SYMPTOMS THAT SHOULD BE REPORTED IMMEDIATELY: *FEVER GREATER THAN 100.4 F (38 C) OR HIGHER *CHILLS OR SWEATING *NAUSEA AND VOMITING THAT IS NOT CONTROLLED WITH YOUR NAUSEA MEDICATION *UNUSUAL SHORTNESS OF BREATH *UNUSUAL BRUISING OR BLEEDING *URINARY PROBLEMS (pain or burning when urinating, or frequent urination) *BOWEL PROBLEMS (unusual diarrhea, constipation, pain near the anus) TENDERNESS IN MOUTH AND THROAT WITH OR WITHOUT PRESENCE OF ULCERS (sore throat, sores in mouth, or a toothache) UNUSUAL RASH, SWELLING OR PAIN  UNUSUAL VAGINAL DISCHARGE OR ITCHING   Items with * indicate a potential emergency and should be followed up as soon as possible or go to the Emergency Department if any problems should occur.  Please show the CHEMOTHERAPY ALERT CARD or  IMMUNOTHERAPY ALERT CARD at check-in to the Emergency Department and triage nurse.  Should you have questions after your visit or need to cancel or reschedule your appointment, please contact [place] [facility]  Dept: [phone]  and follow the prompts.  Office hours are 8:00 a.m. to 4:30 p.m. Monday - Friday. Please note that voicemails left after 4:00 p.m. may not be returned until the following business day.  We are closed weekends and major holidays. You have access to a nurse at all times for urgent questions. Please call the main number to the clinic Dept: [phone] and follow the prompts.   For any non-urgent questions, you may also contact your provider using MyChart. We now offer e-Visits for anyone 46 and older to request care online for non-urgent symptoms. For details visit [email or web address].   Also download the MyChart app! Go to the app store, search "MyChart", open the app, select , and log in with your MyChart username and password.

## 2023-03-26 ENCOUNTER — Other Ambulatory Visit: Payer: Self-pay

## 2023-04-03 MED FILL — Fosaprepitant Dimeglumine For IV Infusion 150 MG (Base Eq): INTRAVENOUS | Qty: 5 | Status: AC

## 2023-04-03 MED FILL — Dexamethasone Sodium Phosphate Inj 100 MG/10ML: INTRAMUSCULAR | Qty: 1 | Status: AC

## 2023-04-04 ENCOUNTER — Inpatient Hospital Stay: Payer: BC Managed Care – PPO

## 2023-04-04 ENCOUNTER — Inpatient Hospital Stay (HOSPITAL_BASED_OUTPATIENT_CLINIC_OR_DEPARTMENT_OTHER): Payer: BC Managed Care – PPO | Admitting: Hematology and Oncology

## 2023-04-04 ENCOUNTER — Inpatient Hospital Stay: Payer: BC Managed Care – PPO | Attending: Hematology and Oncology

## 2023-04-04 VITALS — BP 137/82 | HR 82 | Temp 97.8°F | Resp 16 | Wt 288.1 lb

## 2023-04-04 DIAGNOSIS — Z21 Asymptomatic human immunodeficiency virus [HIV] infection status: Secondary | ICD-10-CM | POA: Insufficient documentation

## 2023-04-04 DIAGNOSIS — K5900 Constipation, unspecified: Secondary | ICD-10-CM | POA: Insufficient documentation

## 2023-04-04 DIAGNOSIS — G629 Polyneuropathy, unspecified: Secondary | ICD-10-CM | POA: Insufficient documentation

## 2023-04-04 DIAGNOSIS — C8118 Nodular sclerosis classical Hodgkin lymphoma, lymph nodes of multiple sites: Secondary | ICD-10-CM | POA: Diagnosis not present

## 2023-04-04 DIAGNOSIS — C8174 Other classical Hodgkin lymphoma, lymph nodes of axilla and upper limb: Secondary | ICD-10-CM | POA: Insufficient documentation

## 2023-04-04 DIAGNOSIS — Z79899 Other long term (current) drug therapy: Secondary | ICD-10-CM | POA: Diagnosis not present

## 2023-04-04 DIAGNOSIS — Z95828 Presence of other vascular implants and grafts: Secondary | ICD-10-CM

## 2023-04-04 DIAGNOSIS — Z5111 Encounter for antineoplastic chemotherapy: Secondary | ICD-10-CM | POA: Diagnosis not present

## 2023-04-04 DIAGNOSIS — Z888 Allergy status to other drugs, medicaments and biological substances status: Secondary | ICD-10-CM | POA: Insufficient documentation

## 2023-04-04 DIAGNOSIS — N183 Chronic kidney disease, stage 3 unspecified: Secondary | ICD-10-CM | POA: Diagnosis not present

## 2023-04-04 DIAGNOSIS — Z8249 Family history of ischemic heart disease and other diseases of the circulatory system: Secondary | ICD-10-CM | POA: Diagnosis not present

## 2023-04-04 LAB — CBC WITH DIFFERENTIAL (CANCER CENTER ONLY)
Abs Immature Granulocytes: 0.01 10*3/uL (ref 0.00–0.07)
Basophils Absolute: 0 10*3/uL (ref 0.0–0.1)
Basophils Relative: 1 %
Eosinophils Absolute: 0.2 10*3/uL (ref 0.0–0.5)
Eosinophils Relative: 6 %
HCT: 38.1 % — ABNORMAL LOW (ref 39.0–52.0)
Hemoglobin: 13.2 g/dL (ref 13.0–17.0)
Immature Granulocytes: 0 %
Lymphocytes Relative: 44 %
Lymphs Abs: 1.4 10*3/uL (ref 0.7–4.0)
MCH: 29.6 pg (ref 26.0–34.0)
MCHC: 34.6 g/dL (ref 30.0–36.0)
MCV: 85.4 fL (ref 80.0–100.0)
Monocytes Absolute: 0.6 10*3/uL (ref 0.1–1.0)
Monocytes Relative: 19 %
Neutro Abs: 0.9 10*3/uL — ABNORMAL LOW (ref 1.7–7.7)
Neutrophils Relative %: 30 %
Platelet Count: 218 10*3/uL (ref 150–400)
RBC: 4.46 MIL/uL (ref 4.22–5.81)
RDW: 14.5 % (ref 11.5–15.5)
WBC Count: 3.2 10*3/uL — ABNORMAL LOW (ref 4.0–10.5)
nRBC: 0 % (ref 0.0–0.2)

## 2023-04-04 LAB — CMP (CANCER CENTER ONLY)
ALT: 10 U/L (ref 0–44)
AST: 15 U/L (ref 15–41)
Albumin: 4.2 g/dL (ref 3.5–5.0)
Alkaline Phosphatase: 57 U/L (ref 38–126)
Anion gap: 3 — ABNORMAL LOW (ref 5–15)
BUN: 16 mg/dL (ref 6–20)
CO2: 28 mmol/L (ref 22–32)
Calcium: 9.1 mg/dL (ref 8.9–10.3)
Chloride: 108 mmol/L (ref 98–111)
Creatinine: 1.33 mg/dL — ABNORMAL HIGH (ref 0.61–1.24)
GFR, Estimated: >60 mL/min (ref >60)
Glucose, Bld: 93 mg/dL (ref 70–99)
Potassium: 4.3 mmol/L (ref 3.5–5.1)
Sodium: 139 mmol/L (ref 135–145)
Total Bilirubin: 0.5 mg/dL (ref 0.3–1.2)
Total Protein: 7.2 g/dL (ref 6.5–8.1)

## 2023-04-04 MED ORDER — SODIUM CHLORIDE 0.9 % IV SOLN
10.0000 mg | Freq: Once | INTRAVENOUS | Status: AC
  Administered 2023-04-04: 10 mg via INTRAVENOUS
  Filled 2023-04-04: qty 10

## 2023-04-04 MED ORDER — SODIUM CHLORIDE 0.9 % IV SOLN
150.0000 mg | Freq: Once | INTRAVENOUS | Status: AC
  Administered 2023-04-04: 150 mg via INTRAVENOUS
  Filled 2023-04-04: qty 150

## 2023-04-04 MED ORDER — SODIUM CHLORIDE 0.9 % IV SOLN: Freq: Once | INTRAVENOUS | Status: AC

## 2023-04-04 MED ORDER — SODIUM CHLORIDE 0.9% FLUSH
10.0000 mL | INTRAVENOUS | Status: DC | PRN
  Administered 2023-04-04: 10 mL

## 2023-04-04 MED ORDER — SODIUM CHLORIDE 0.9 % IV SOLN
900.0000 mg | Freq: Once | INTRAVENOUS | Status: AC
  Administered 2023-04-04: 900 mg via INTRAVENOUS
  Filled 2023-04-04: qty 90

## 2023-04-04 MED ORDER — PALONOSETRON HCL INJECTION 0.25 MG/5ML
0.2500 mg | Freq: Once | INTRAVENOUS | Status: AC
  Administered 2023-04-04: 0.25 mg via INTRAVENOUS
  Filled 2023-04-04: qty 5

## 2023-04-04 MED ORDER — ACETAMINOPHEN 325 MG PO TABS
650.0000 mg | ORAL_TABLET | Freq: Once | ORAL | Status: AC
  Administered 2023-04-04: 650 mg via ORAL
  Filled 2023-04-04: qty 2

## 2023-04-04 MED ORDER — VINBLASTINE SULFATE CHEMO INJECTION 1 MG/ML
6.0000 mg/m2 | Freq: Once | INTRAVENOUS | Status: AC
  Administered 2023-04-04: 14 mg via INTRAVENOUS
  Filled 2023-04-04: qty 14

## 2023-04-04 MED ORDER — DOXORUBICIN HCL CHEMO IV INJECTION 2 MG/ML
25.0000 mg/m2 | Freq: Once | INTRAVENOUS | Status: AC
  Administered 2023-04-04: 58 mg via INTRAVENOUS
  Filled 2023-04-04: qty 29

## 2023-04-04 MED ORDER — DIPHENHYDRAMINE HCL 50 MG/ML IJ SOLN
50.0000 mg | Freq: Once | INTRAMUSCULAR | Status: AC
  Administered 2023-04-04: 50 mg via INTRAVENOUS
  Filled 2023-04-04: qty 1

## 2023-04-04 NOTE — Progress Notes (Signed)
[facility] [phone]   [phone]  PROGRESS NOTE  Patient Care Team: Shirline Frees, NP as PCP - General (Family Medicine) Sidney Ace, MD as Referring Physician (Allergy) Jaci Standard, MD as Consulting Physician (Hematology and Oncology)  Hematological/Oncological History # Hodgkin's Lymphoma, Stage III 06/08/2022: CT neck showed Bulky adenopathy in the deep right pectoral region, recommend chest CT with contrast. There was splenomegaly on abdominal CT from the summer, consider addition of abdominal CT is well. 07/01/2023: CT chest showed Bulky RIGHT axillary lymph node enlargement, with nodal mass measuring 7 cm in length. Findings suspicious for lymphoproliferative disease 09/13/2022: US guided biopsy of right axillary lymphadenopathy was consistent with classical Hodgkin's lymphoma  10/06/2022: establish care with Dr. Leonides Schanz  10/31/2022: Cycle 1 Day 1 of A-AVD chemotherapy.  11/14/2022: Cycle 1 Day 15 of A-AVD chemotherapy 11/29/2022: Cycle 2 Day 1 of A-AVD chemotherapy 12/13/2022: Cycle 2 Day 15 of A-AVD chemotherapy (holding brentuximab and Udenyca due to worsening skin lesions from hidradenitis suppurativa) 12/19/2022: CT PET Study showed near complete metabolic response with residual 18 mm short axis right axillary node (Deauville criteria 2). 12/27/2022: Cycle 3 Day 1 of AVD chemotherapy 01/10/2023: Cycle 3 Day 15 of AVD chemotherapy 01/23/2023:  Cycle 4 Day 1 of AVD chemotherapy 02/06/2023: Cycle 4 Day 15 of AVD chemotherapy 02/20/2023: Cycle 5 Day 1 of AVD chemotherapy 03/07/2023: Cycle 5 Day 15 of AVD chemotherapy 03/20/2023: Cycle 6 Day 1 of AVD chemotherapy 04/04/2023: Cycle 6 Day 15 of AVD chemotherapy  Interval History:  Lee [name] 42 y.o. male with medical history significant for stage III nodular sclerosing Hodgkin's lymphoma who presents for a follow up visit. The patient's last visit was on 03/20/2023. In the interim since the  last visit he completed Cycle 6, Day 1 of AVD chemotherapy. He is unaccompanied for this visit.   On exam today Lee [name] reports he has been well overall and interim since her last visit.  He reports that he is excited to get his cycle 6-day 15 underway.  He notes that his chemotherapy side effects were "at the same old same old".  He was tired for 4 to 5 days but continues to eat well.  His weight has been increasing markedly, he is up to 288 pounds from 279 pounds in August.  He notes he does continue to have a little bit of numbness and neuropathy in his fingers but overall it is stable.  He is had no infectious symptoms such as runny nose, sore throat, or cough.  He notes that his skin has not had any further flares or issues.  He is having some persistent dryness in his throat but overall has no questions concerns or complaints today.  He denies fevers, chills, sweats, shortness of breath, chest pain or cough.  He has no other complaints.He is willing and able to proceed with treatment today.  A full 10 point ROS is otherwise negative.  MEDICAL HISTORY:  Past Medical History:  Diagnosis Date   CKD (chronic kidney disease) stage 3, GFR 30-59 ml/min (HCC) 02/20/2019   Eczema    Environmental allergies    Essential hypertension    HIV test positive (HCC)    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HCC) 07/24/2016   Shingles     SURGICAL HISTORY: Past Surgical History:  Procedure Laterality Date   CHOLECYSTECTOMY N/A 12/30/2021   Procedure: LAPAROSCOPIC CHOLECYSTECTOMY WITH  CHOLANGIOGRAM;  Surgeon: Darnell Level, MD;  Location: [facility];  Service:  General;  Laterality: N/A;   IR IMAGING GUIDED PORT INSERTION  10/18/2022   LAPAROSCOPIC APPENDECTOMY N/A 03/12/2020   Procedure: APPENDECTOMY LAPAROSCOPIC;  Surgeon: Romie Levee, MD;  Location: [facility];  Service: General;  Laterality: N/A;   TYMPANOSTOMY TUBE PLACEMENT     WISDOM TOOTH EXTRACTION     WRIST SURGERY      SOCIAL HISTORY: Social  History   Socioeconomic History   Marital status: Single    Spouse name: Not on file   Number of children: Not on file   Years of education: Not on file   Highest education level: Associate degree: academic program  Occupational History   Not on file  Tobacco Use   Smoking status: Never   Smokeless tobacco: Never  Vaping Use   Vaping status: Never Used  Substance and Sexual Activity   Alcohol use: No   Drug use: No   Sexual activity: Not Currently    Partners: Male    Birth control/protection: Condom    Comment: Given Condoms  Other Topics Concern   Not on file  Social History Narrative   Works as Acupuncturist    -Not married   Social Determinants of Health   Financial Resource Strain: Low Risk  (02/09/2022)   Overall Financial Resource Strain (CARDIA)    Difficulty of Paying Living Expenses: Not hard at all  Food Insecurity: No Food Insecurity (06/27/2022)   Hunger Vital Sign    Worried About Running Out of Food in the Last Year: Never true    Ran Out of Food in the Last Year: Never true  Transportation Needs: No Transportation Needs (06/27/2022)   PRAPARE - Administrator, Civil Service (Medical): No    Lack of Transportation (Non-Medical): No  Physical Activity: Unknown (02/09/2022)   Exercise Vital Sign    Days of Exercise per Week: 0 days    Minutes of Exercise per Session: Not on file  Stress: Stress Concern Present (02/09/2022)   [employer] of Occupational Health - Occupational Stress Questionnaire    Feeling of Stress : To some extent  Social Connections: Moderately Integrated (02/09/2022)   Social Connection and Isolation Panel [NHANES]    Frequency of Communication with Friends and Family: More than three times a week    Frequency of Social Gatherings with Friends and Family: Not on file    Attends Religious Services: More than 4 times per year    Active Member of [employer] or Organizations: Yes    Attends Engineer, structural: More than 4  times per year    Marital Status: Never married  Catering manager Violence: Not on file    FAMILY HISTORY: Family History  Problem Relation Age of Onset   Hypercalcemia Mother    Hypertension Mother    Hypertension Father    Prostate cancer Father    Multiple myeloma Father     ALLERGIES:  is allergic to gabapentin, lisinopril, and nickel.  MEDICATIONS:  Current Outpatient Medications  Medication Sig Dispense Refill   acetaminophen (TYLENOL) 650 MG CR tablet Take 650 mg by mouth every 8 (eight) hours as needed for pain.     albuterol (PROVENTIL HFA;VENTOLIN HFA) 108 (90 Base) MCG/ACT inhaler Inhale 2 puffs into the lungs every 6 (six) hours as needed for wheezing or shortness of breath. 1 Inhaler 1   amLODipine (NORVASC) 10 MG tablet Take 1 tablet by mouth once daily 90 tablet 3   azelastine (ASTELIN) 0.1 % nasal spray 1-2 puffs  in each nostril twice daily     azelastine (OPTIVAR) 0.05 % ophthalmic solution Apply to eye.     [name] 50-200-25 MG TABS tablet TAKE 1 TABLET DAILY 30 tablet 5   BREO ELLIPTA 200-25 MCG/ACT AEPB Inhale 1 puff into the lungs daily.     EPINEPHrine 0.3 mg/0.3 mL IJ SOAJ injection Inject 0.3 mg into the muscle once.     lidocaine-prilocaine (EMLA) cream Apply 1 Application topically as needed. 30 g 0   loratadine (CLARITIN) 10 MG tablet Take 10 mg by mouth daily.     montelukast (SINGULAIR) 10 MG tablet Take 10 mg by mouth at bedtime.     mupirocin ointment (BACTROBAN) 2 % Apply 1 Application topically 2 (two) times daily. Apply topical to the affected area twice a day until clear 30 g 3   ondansetron (ZOFRAN) 8 MG tablet Take 1 tablet (8 mg total) by mouth every 8 (eight) hours as needed. 30 tablet 0   ondansetron (ZOFRAN-ODT) 8 MG disintegrating tablet Take 1 tablet (8 mg total) by mouth every 8 (eight) hours as needed for nausea or vomiting. 30 tablet 0   oxyCODONE (OXYCONTIN) 10 mg 12 hr tablet Take 1 tablet (10 mg total) by mouth every 12 (twelve)  hours. 14 tablet 0   oxyCODONE 10 MG TABS Take 1 tablet (10 mg total) by mouth every 4 (four) hours as needed for severe pain. 90 tablet 0   prochlorperazine (COMPAZINE) 10 MG tablet Take 1 tablet (10 mg total) by mouth every 6 (six) hours as needed for nausea or vomiting. 30 tablet 0   No current facility-administered medications for this visit.   Facility-Administered Medications Ordered in Other Visits  Medication Dose Route Frequency Provider Last Rate Last Admin   dacarbazine (DTIC) 900 mg in sodium chloride 0.9 % 250 mL chemo infusion  900 mg Intravenous Once Ulysees Barns IV, MD 340 mL/hr at 04/04/23 1323 900 mg at 04/04/23 1323    REVIEW OF SYSTEMS:   Constitutional: ( - ) fevers, ( - )  chills , ( - ) night sweats Eyes: ( - ) blurriness of vision, ( - ) double vision, ( - ) watery eyes Ears, nose, mouth, throat, and face: ( - ) mucositis, ( - ) sore throat Respiratory: ( - ) cough, ( - ) dyspnea, ( - ) wheezes Cardiovascular: ( - ) palpitation, ( - ) chest discomfort, ( - ) lower extremity swelling Gastrointestinal:  ( - ) nausea, ( - ) heartburn, ( - ) change in bowel habits Skin: ( - ) abnormal skin rashes Lymphatics: ( - ) new lymphadenopathy, ( - ) easy bruising Neurological: ( - ) numbness, ( - ) tingling, ( - ) new weaknesses Behavioral/Psych: ( - ) mood change, ( - ) new changes  All other systems were reviewed with the patient and are negative.  PHYSICAL EXAMINATION: ECOG PERFORMANCE STATUS: 1 - Symptomatic but completely ambulatory  Vitals:   04/04/23 1100  BP: 137/82  Pulse: 82  Resp: 16  Temp: 97.8 F (36.6 C)  SpO2: 100%    Filed Weights   04/04/23 1100  Weight: 288 lb 1.6 oz (130.7 kg)     GENERAL: Well-appearing young African-American male, alert, no distress and comfortable SKIN: skin color, texture, turgor are normal, no rashes. EYES: conjunctiva are pink and non-injected, sclera clear LUNGS: clear to auscultation and percussion with normal  breathing effort HEART: regular rate & rhythm and no murmurs and no lower extremity  edema Musculoskeletal: no cyanosis of digits and no clubbing  PSYCH: alert & oriented x 3, fluent speech NEURO: no focal motor/sensory deficits  LABORATORY DATA:  I have reviewed the data as listed    Latest Ref Rng & Units 04/04/2023   10:35 AM 03/20/2023   10:32 AM 03/07/2023   10:38 AM  CBC  WBC 4.0 - 10.5 K/uL 3.2  2.4  2.4   Hemoglobin 13.0 - 17.0 g/dL 42.5  95.6  38.7   Hematocrit 39.0 - 52.0 % 38.1  36.3  35.8   Platelets 150 - 400 K/uL 218  205  193        Latest Ref Rng & Units 04/04/2023   10:35 AM 03/20/2023   10:32 AM 03/07/2023   10:38 AM  CMP  Glucose 70 - 99 mg/dL 93  564  98   BUN 6 - 20 mg/dL 16  18  16    Creatinine 0.61 - 1.24 mg/dL 3.32  9.51  8.84   Sodium 135 - 145 mmol/L 139  139  141   Potassium 3.5 - 5.1 mmol/L 4.3  3.8  4.2   Chloride 98 - 111 mmol/L 108  108  110   CO2 22 - 32 mmol/L 28  25  28    Calcium 8.9 - 10.3 mg/dL 9.1  8.9  8.8   Total Protein 6.5 - 8.1 g/dL 7.2  7.1  7.0   Total Bilirubin 0.3 - 1.2 mg/dL 0.5  0.5  0.5   Alkaline Phos 38 - 126 U/L 57  57  57   AST 15 - 41 U/L 15  14  15    ALT 0 - 44 U/L 10  9  10       RADIOGRAPHIC STUDIES: I have personally reviewed the radiological images as listed and agreed with the findings in the report: FDG avid lymph nodes on both sides of the diaphragm, most prominently in the cervical regions and axilla.  Consistent with at least stage III disease. No results found.  ASSESSMENT & PLAN Lee [name] is a 42 y.o. male with medical history significant for stage III nodular sclerosing Hodgkin's lymphoma who presents for a follow up visit.    # Hodgkin's Lymphoma, Stage III -- PET CT scan staging complete, findings consistent with stage III Hodgkin's lymphoma. --Will plan to proceed with A-AVD chemotherapy. --Echocardiogram complete, shows excellent baseline cardiac function. --Mid treatment PET CT scan on  12/19/2022 showed excellent response to treatment (Deauville 2).  --Due to worsening skin lesions that is likely flaring up from Udenyca injection, we held Brentuximab and Udenyca injection starting Cycle 2, Day 15.  Plan:  --Labs today show Hgb 13.2, white blood cell 3.2, MCV 85.4, and platelets of 218 stable but persistent neutropenia with ANC 0.9. Creatinine is stable at 1.33, LFTs normal.  --Due for Cycle 6 Day 15 of AVD chemotherapy today  --Strict neutropenic precautions given including monitor for fevers --Proceed with treatment without any further dose modifications.  --RTC in 4 weeks after treatment with post treatment PET CT scan.   #Hidradenitis Suppurativa--resolved --Under the care of dermatology and relates flare ups to GCSF injection which has greatly improved since discontinuing GCSF.  --Continue with oral doxycycline and intralesional kenalog as needed  #Constipation-stable: --Likely secondary to opoid pain medication --Recommend to try OTC stool softeners such as senna or miralax.    #Supportive Care -- chemotherapy education complete -- port placed.  Can be removed if no concerning findings on upcoming PET CT  scan. -- zofran 8mg  q8H PRN and compazine 10mg  PO q6H for nausea -- EMLA cream for port.  -- no pain medication required at this time.   Orders Placed This Encounter  Procedures   NM PET Image Restag (PS) Skull Base To Thigh    Standing Status:   Future    Standing Expiration Date:   04/03/2024    Order Specific Question:   If indicated for the ordered procedure, I authorize the administration of a radiopharmaceutical per Radiology protocol    Answer:   Yes    Order Specific Question:   Preferred imaging location?    Answer:   [facility]   All questions were answered. The patient knows to call the clinic with any problems, questions or concerns.  I have spent a total of 30 minutes minutes of face-to-face and non-face-to-face time, preparing to see the  patient,performing a medically appropriate examination, counseling and educating the patient, ordering medications/tests/procedures,documenting clinical information in the electronic health record, and care coordination.   Ulysees Barns, MD Department of Hematology/Oncology [facility] Phone: [phone] Pager: [phone] Email: [email or web address]  04/04/2023 1:49 PM

## 2023-04-04 NOTE — Progress Notes (Signed)
Ok to treat today with ANC of 0.8 today per Dr Leonides Schanz.

## 2023-04-04 NOTE — Patient Instructions (Signed)
[place] [facility]  Discharge Instructions: Thank you for choosing [facility] to provide your oncology and hematology care.   If you have a lab appointment with the Cancer Center, please go directly to the Cancer Center and check in at the registration area.   Wear comfortable clothing and clothing appropriate for easy access to any Portacath or PICC line.   We strive to give you quality time with your provider. You may need to reschedule your appointment if you arrive late (15 or more minutes).  Arriving late affects you and other patients whose appointments are after yours.  Also, if you miss three or more appointments without notifying the office, you may be dismissed from the clinic at the provider's discretion.      For prescription refill requests, have your pharmacy contact our office and allow 72 hours for refills to be completed.    Today you received the following chemotherapy and/or immunotherapy agents: Doxorubicin, Velban, Dacarbazine.       To help prevent nausea and vomiting after your treatment, we encourage you to take your nausea medication as directed.  BELOW ARE SYMPTOMS THAT SHOULD BE REPORTED IMMEDIATELY: *FEVER GREATER THAN 100.4 F (38 C) OR HIGHER *CHILLS OR SWEATING *NAUSEA AND VOMITING THAT IS NOT CONTROLLED WITH YOUR NAUSEA MEDICATION *UNUSUAL SHORTNESS OF BREATH *UNUSUAL BRUISING OR BLEEDING *URINARY PROBLEMS (pain or burning when urinating, or frequent urination) *BOWEL PROBLEMS (unusual diarrhea, constipation, pain near the anus) TENDERNESS IN MOUTH AND THROAT WITH OR WITHOUT PRESENCE OF ULCERS (sore throat, sores in mouth, or a toothache) UNUSUAL RASH, SWELLING OR PAIN  UNUSUAL VAGINAL DISCHARGE OR ITCHING   Items with * indicate a potential emergency and should be followed up as soon as possible or go to the Emergency Department if any problems should occur.  Please show the CHEMOTHERAPY ALERT CARD or  IMMUNOTHERAPY ALERT CARD at check-in to the Emergency Department and triage nurse.  Should you have questions after your visit or need to cancel or reschedule your appointment, please contact [place] [facility]  Dept: [phone]  and follow the prompts.  Office hours are 8:00 a.m. to 4:30 p.m. Monday - Friday. Please note that voicemails left after 4:00 p.m. may not be returned until the following business day.  We are closed weekends and major holidays. You have access to a nurse at all times for urgent questions. Please call the main number to the clinic Dept: [phone] and follow the prompts.   For any non-urgent questions, you may also contact your provider using MyChart. We now offer e-Visits for anyone 46 and older to request care online for non-urgent symptoms. For details visit [email or web address].   Also download the MyChart app! Go to the app store, search "MyChart", open the app, select , and log in with your MyChart username and password.

## 2023-04-04 NOTE — Progress Notes (Signed)
Ok to continue Doxorubicin 58mg .  Anola Gurney [place], [place], BCPS, BCOP  04/04/2023 12:54 PM

## 2023-04-06 ENCOUNTER — Other Ambulatory Visit: Payer: Self-pay

## 2023-04-10 ENCOUNTER — Encounter: Payer: Self-pay | Admitting: Hematology and Oncology

## 2023-04-18 ENCOUNTER — Ambulatory Visit (HOSPITAL_COMMUNITY)
Admission: RE | Admit: 2023-04-18 | Discharge: 2023-04-18 | Disposition: A | Discharge status: Home / Self Care | Payer: BC Managed Care – PPO | Source: Ambulatory Visit | Attending: Hematology and Oncology | Admitting: Hematology and Oncology

## 2023-04-18 DIAGNOSIS — C8118 Nodular sclerosis classical Hodgkin lymphoma, lymph nodes of multiple sites: Secondary | ICD-10-CM | POA: Insufficient documentation

## 2023-04-18 LAB — GLUCOSE, CAPILLARY: Glucose-Capillary: 92 mg/dL (ref 70–99)

## 2023-04-18 MED ORDER — FLUDEOXYGLUCOSE F - 18 (FDG) INJECTION
14.5000 | Freq: Once | INTRAVENOUS | Status: AC | PRN
  Administered 2023-04-18: 14.3 via INTRAVENOUS

## 2023-04-19 DIAGNOSIS — R918 Other nonspecific abnormal finding of lung field: Secondary | ICD-10-CM | POA: Diagnosis not present

## 2023-04-19 DIAGNOSIS — C8100 Nodular lymphocyte predominant Hodgkin lymphoma, unspecified site: Secondary | ICD-10-CM | POA: Diagnosis not present

## 2023-04-26 NOTE — Progress Notes (Signed)
[facility] Telephone:([phone]   [phone]  PROGRESS NOTE  Patient Care Team: Shirline Frees, NP as PCP - General (Family Medicine) Sidney Ace, MD as Referring Physician (Allergy) Jaci Standard, MD as Consulting Physician (Hematology and Oncology)  Hematological/Oncological History # Hodgkin's Lymphoma, Stage III 06/08/2022: CT neck showed Bulky adenopathy in the deep right pectoral region, recommend chest CT with contrast. There was splenomegaly on abdominal CT from the summer, consider addition of abdominal CT is well. 07/01/2023: CT chest showed Bulky RIGHT axillary lymph node enlargement, with nodal mass measuring 7 cm in length. Findings suspicious for lymphoproliferative disease 09/13/2022: US guided biopsy of right axillary lymphadenopathy was consistent with classical Hodgkin's lymphoma  10/06/2022: establish care with Dr. Leonides Schanz  10/31/2022: Cycle 1 Day 1 of A-AVD chemotherapy.  11/14/2022: Cycle 1 Day 15 of A-AVD chemotherapy 11/29/2022: Cycle 2 Day 1 of A-AVD chemotherapy 12/13/2022: Cycle 2 Day 15 of A-AVD chemotherapy (holding brentuximab and Udenyca due to worsening skin lesions from hidradenitis suppurativa) 12/19/2022: CT PET Study showed near complete metabolic response with residual 18 mm short axis right axillary node (Deauville criteria 2). 12/27/2022: Cycle 3 Day 1 of AVD chemotherapy 01/10/2023: Cycle 3 Day 15 of AVD chemotherapy 01/23/2023:  Cycle 4 Day 1 of AVD chemotherapy 02/06/2023: Cycle 4 Day 15 of AVD chemotherapy 02/20/2023: Cycle 5 Day 1 of AVD chemotherapy 03/07/2023: Cycle 5 Day 15 of AVD chemotherapy 03/20/2023: Cycle 6 Day 1 of AVD chemotherapy 04/04/2023: Cycle 6 Day 15 of AVD chemotherapy  Interval History:  Lee [name] 42 y.o. male with medical history significant for stage III nodular sclerosing Hodgkin's lymphoma who presents for a follow up visit. The patient's last visit was on 04/04/2023. In the interim since the  last visit he completed Cycle 6, Day 15 of AVD chemotherapy. He is unaccompanied for this visit.   On exam today Lee [name] reports he has been well overall and the numbness is her last visit.  He reports he had a little bit of dry mouth and dry throat but not as bad as it was and what for started treatment.  He knows he is try to do his best to try to increase walking.  He notes that he is having a little bit of ankle swelling but overall feels like he is returning to his baseline level of health.  He feels like he may be eating too much.  He reports that he is going to work with his weight management specialist to get his weight under better control.  He is not currently having any infectious symptoms such as runny nose, sore throat, or cough.  He otherwise denies any fevers, chills, sweats, nausea vomiting or diarrhea.  Full 10 point ROS is otherwise negative.    MEDICAL HISTORY:  Past Medical History:  Diagnosis Date   CKD (chronic kidney disease) stage 3, GFR 30-59 ml/min (HCC) 02/20/2019   Eczema    Environmental allergies    Essential hypertension    HIV test positive (HCC)    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HCC) 07/24/2016   Shingles     SURGICAL HISTORY: Past Surgical History:  Procedure Laterality Date   CHOLECYSTECTOMY N/A 12/30/2021   Procedure: LAPAROSCOPIC CHOLECYSTECTOMY WITH  CHOLANGIOGRAM;  Surgeon: Darnell Level, MD;  Location: [facility];  Service: General;  Laterality: N/A;   IR IMAGING GUIDED PORT INSERTION  10/18/2022   LAPAROSCOPIC APPENDECTOMY N/A 03/12/2020   Procedure: APPENDECTOMY LAPAROSCOPIC;  Surgeon: Romie Levee, MD;  Location:  [facility];  Service: General;  Laterality: N/A;   TYMPANOSTOMY TUBE PLACEMENT     WISDOM TOOTH EXTRACTION     WRIST SURGERY      SOCIAL HISTORY: Social History   Socioeconomic History   Marital status: Single    Spouse name: Not on file   Number of children: Not on file   Years of education: Not on file   Highest education  level: Associate degree: academic program  Occupational History   Not on file  Tobacco Use   Smoking status: Never   Smokeless tobacco: Never  Vaping Use   Vaping status: Never Used  Substance and Sexual Activity   Alcohol use: No   Drug use: No   Sexual activity: Not Currently    Partners: Male    Birth control/protection: Condom    Comment: Given Condoms  Other Topics Concern   Not on file  Social History Narrative   Works as Acupuncturist    -Not married   Social Determinants of Health   Financial Resource Strain: Low Risk  (02/09/2022)   Overall Financial Resource Strain (CARDIA)    Difficulty of Paying Living Expenses: Not hard at all  Food Insecurity: No Food Insecurity (06/27/2022)   Hunger Vital Sign    Worried About Running Out of Food in the Last Year: Never true    Ran Out of Food in the Last Year: Never true  Transportation Needs: No Transportation Needs (06/27/2022)   PRAPARE - Administrator, Civil Service (Medical): No    Lack of Transportation (Non-Medical): No  Physical Activity: Unknown (02/09/2022)   Exercise Vital Sign    Days of Exercise per Week: 0 days    Minutes of Exercise per Session: Not on file  Stress: Stress Concern Present (02/09/2022)   [employer] of Occupational Health - Occupational Stress Questionnaire    Feeling of Stress : To some extent  Social Connections: Moderately Integrated (02/09/2022)   Social Connection and Isolation Panel [NHANES]    Frequency of Communication with Friends and Family: More than three times a week    Frequency of Social Gatherings with Friends and Family: Not on file    Attends Religious Services: More than 4 times per year    Active Member of [employer] or Organizations: Yes    Attends Engineer, structural: More than 4 times per year    Marital Status: Never married  Catering manager Violence: Not on file    FAMILY HISTORY: Family History  Problem Relation Age of Onset   Hypercalcemia Mother     Hypertension Mother    Hypertension Father    Prostate cancer Father    Multiple myeloma Father     ALLERGIES:  is allergic to gabapentin, lisinopril, and nickel.  MEDICATIONS:  Current Outpatient Medications  Medication Sig Dispense Refill   acetaminophen (TYLENOL) 650 MG CR tablet Take 650 mg by mouth every 8 (eight) hours as needed for pain.     albuterol (PROVENTIL HFA;VENTOLIN HFA) 108 (90 Base) MCG/ACT inhaler Inhale 2 puffs into the lungs every 6 (six) hours as needed for wheezing or shortness of breath. 1 Inhaler 1   amLODipine (NORVASC) 10 MG tablet Take 1 tablet by mouth once daily 90 tablet 3   azelastine (ASTELIN) 0.1 % nasal spray 1-2 puffs in each nostril twice daily     azelastine (OPTIVAR) 0.05 % ophthalmic solution Apply to eye.     [name] 50-200-25 MG TABS tablet TAKE 1 TABLET DAILY  30 tablet 5   BREO ELLIPTA 200-25 MCG/ACT AEPB Inhale 1 puff into the lungs daily.     EPINEPHrine 0.3 mg/0.3 mL IJ SOAJ injection Inject 0.3 mg into the muscle once.     lidocaine-prilocaine (EMLA) cream Apply 1 Application topically as needed. 30 g 0   loratadine (CLARITIN) 10 MG tablet Take 10 mg by mouth daily.     montelukast (SINGULAIR) 10 MG tablet Take 10 mg by mouth at bedtime.     mupirocin ointment (BACTROBAN) 2 % Apply 1 Application topically 2 (two) times daily. Apply topical to the affected area twice a day until clear 30 g 3   ondansetron (ZOFRAN) 8 MG tablet Take 1 tablet (8 mg total) by mouth every 8 (eight) hours as needed. 30 tablet 0   ondansetron (ZOFRAN-ODT) 8 MG disintegrating tablet Take 1 tablet (8 mg total) by mouth every 8 (eight) hours as needed for nausea or vomiting. 30 tablet 0   oxyCODONE (OXYCONTIN) 10 mg 12 hr tablet Take 1 tablet (10 mg total) by mouth every 12 (twelve) hours. 14 tablet 0   oxyCODONE 10 MG TABS Take 1 tablet (10 mg total) by mouth every 4 (four) hours as needed for severe pain. 90 tablet 0   prochlorperazine (COMPAZINE) 10 MG tablet Take  1 tablet (10 mg total) by mouth every 6 (six) hours as needed for nausea or vomiting. 30 tablet 0   No current facility-administered medications for this visit.    REVIEW OF SYSTEMS:   Constitutional: ( - ) fevers, ( - )  chills , ( - ) night sweats Eyes: ( - ) blurriness of vision, ( - ) double vision, ( - ) watery eyes Ears, nose, mouth, throat, and face: ( - ) mucositis, ( - ) sore throat Respiratory: ( - ) cough, ( - ) dyspnea, ( - ) wheezes Cardiovascular: ( - ) palpitation, ( - ) chest discomfort, ( - ) lower extremity swelling Gastrointestinal:  ( - ) nausea, ( - ) heartburn, ( - ) change in bowel habits Skin: ( - ) abnormal skin rashes Lymphatics: ( - ) new lymphadenopathy, ( - ) easy bruising Neurological: ( - ) numbness, ( - ) tingling, ( - ) new weaknesses Behavioral/Psych: ( - ) mood change, ( - ) new changes  All other systems were reviewed with the patient and are negative.  PHYSICAL EXAMINATION: ECOG PERFORMANCE STATUS: 1 - Symptomatic but completely ambulatory  Vitals:   04/27/23 1456 04/27/23 1500  BP: (!) 157/91 123/77  Pulse: 83   Resp: 17   Temp: 98.2 F (36.8 C)   SpO2: 98%      Filed Weights   04/27/23 1456  Weight: 295 lb 12.8 oz (134.2 kg)      GENERAL: Well-appearing young African-American male, alert, no distress and comfortable SKIN: skin color, texture, turgor are normal, no rashes. EYES: conjunctiva are pink and non-injected, sclera clear LUNGS: clear to auscultation and percussion with normal breathing effort HEART: regular rate & rhythm and no murmurs and no lower extremity edema Musculoskeletal: no cyanosis of digits and no clubbing  PSYCH: alert & oriented x 3, fluent speech NEURO: no focal motor/sensory deficits  LABORATORY DATA:  I have reviewed the data as listed    Latest Ref Rng & Units 04/04/2023   10:35 AM 03/20/2023   10:32 AM 03/07/2023   10:38 AM  CBC  WBC 4.0 - 10.5 K/uL 3.2  2.4  2.4   Hemoglobin 13.0 -  17.0 g/dL 40.9   81.1  91.4   Hematocrit 39.0 - 52.0 % 38.1  36.3  35.8   Platelets 150 - 400 K/uL 218  205  193        Latest Ref Rng & Units 04/04/2023   10:35 AM 03/20/2023   10:32 AM 03/07/2023   10:38 AM  CMP  Glucose 70 - 99 mg/dL 93  782  98   BUN 6 - 20 mg/dL 16  18  16    Creatinine 0.61 - 1.24 mg/dL 9.56  2.13  0.86   Sodium 135 - 145 mmol/L 139  139  141   Potassium 3.5 - 5.1 mmol/L 4.3  3.8  4.2   Chloride 98 - 111 mmol/L 108  108  110   CO2 22 - 32 mmol/L 28  25  28    Calcium 8.9 - 10.3 mg/dL 9.1  8.9  8.8   Total Protein 6.5 - 8.1 g/dL 7.2  7.1  7.0   Total Bilirubin 0.3 - 1.2 mg/dL 0.5  0.5  0.5   Alkaline Phos 38 - 126 U/L 57  57  57   AST 15 - 41 U/L 15  14  15    ALT 0 - 44 U/L 10  9  10       RADIOGRAPHIC STUDIES: I have personally reviewed the radiological images as listed and agreed with the findings in the report: FDG avid lymph nodes on both sides of the diaphragm, most prominently in the cervical regions and axilla.  Consistent with at least stage III disease. NM PET Image Restag (PS) Skull Base To Thigh  Result Date: 04/27/2023 CLINICAL DATA:  Subsequent treatment strategy for nodular sclerosing Hodgkin's lymphoma. EXAM: NUCLEAR MEDICINE PET SKULL BASE TO THIGH TECHNIQUE: 14.3 mCi F-18 FDG was injected intravenously. Full-ring PET imaging was performed from the skull base to thigh after the radiotracer. CT data was obtained and used for attenuation correction and anatomic localization. Fasting blood glucose: 92 mg/dl COMPARISON:  Knee FINDINGS: Mediastinal blood pool activity: SUV max 2.1 Liver activity: SUV max 3.2 NECK: No hypermetabolic lymph nodes in the neck. Incidental CT findings: None. CHEST: Continued reduction in volume RIGHT axillary lymph node measuring 17 mm short axis compared to 19 mm. This lymph node has metabolic activity less than background blood pool activity. No new hypermetabolic lymph nodes in the chest. There is however new bilateral pulmonary nodules. Example  nodule in the RIGHT upper lobe measures 7 mm (72/4). Example nodule in the LEFT lung apex measures 9 mm (59/4). There is peribronchial thickening centrally (image 77/4). The peribronchial thickening has mild metabolic activity. for example in the RIGHT lower lobe SUV max equal 2.5 on image 81. This is similar to background blood pool activity. Incidental CT findings: No suspicious pulmonary nodules. ABDOMEN/PELVIS: No abnormal hypermetabolic activity within the liver, pancreas, adrenal glands, or spleen. No hypermetabolic lymph nodes in the abdomen or pelvis. Spleen is normal volume with normal metabolic activity. Incidental CT findings: None. SKELETON: No focal hypermetabolic activity to suggest skeletal metastasis. Incidental CT findings: None. IMPRESSION: 1. No significant metabolic activity remains within the RIGHT axillary lymph node. Deauville 1 2. No new metabolic adenopathy on skull base to thigh FDG PET scan. 3. New bilateral scattered pulmonary nodules and peribronchial thickening with very mild metabolic activity. Primary differential would include lymphoma recurrence versus drug reaction. Recommend clinical correlation. These results will be called to the ordering clinician or representative by the Radiologist Assistant, and communication documented in the  PACS or [employer]. Electronically Signed   By: Genevive Bi M.D.   On: 04/27/2023 11:01    ASSESSMENT & PLAN Lee [name] is a 42 y.o. male with medical history significant for stage III nodular sclerosing Hodgkin's lymphoma who presents for a follow up visit.    # Hodgkin's Lymphoma, Stage III -- PET CT scan staging complete, findings consistent with stage III Hodgkin's lymphoma. --Will plan to proceed with A-AVD chemotherapy. --Echocardiogram complete, shows excellent baseline cardiac function. --Mid treatment PET CT scan on 12/19/2022 showed excellent response to treatment (Deauville 2).  --Due to worsening skin lesions  that is likely flaring up from Udenyca injection, we held Brentuximab and Udenyca injection starting Cycle 2, Day 15.  Plan:  --Labs previously showed Hgb 13.2, white blood cell 3.2, MCV 85.4, and platelets of 218 stable but persistent neutropenia with ANC 0.9. Creatinine is stable at 1.33, LFTs normal.  -- Patient has completed cycle 6 Day 15 of AVD chemotherapy today  --Post treatment PET scan shows no evidence of residual or recurrent disease. --Strict neutropenic precautions given including monitor for fevers --Proceed with treatment without any further dose modifications.  --RTC in 3 months time with repeat CT scan in 6 months  #Hidradenitis Suppurativa--resolved --Under the care of dermatology and relates flare ups to GCSF injection which has greatly improved since discontinuing GCSF.  --Continue with oral doxycycline and intralesional kenalog as needed  #Constipation-stable: --Likely secondary to opoid pain medication --Recommend to try OTC stool softeners such as senna or miralax.    #Supportive Care -- chemotherapy education complete -- port placed.  Can be removed if no concerning findings on upcoming PET CT scan. -- zofran 8mg  q8H PRN and compazine 10mg  PO q6H for nausea -- EMLA cream for port.  -- no pain medication required at this time.   Orders Placed This Encounter  Procedures   IR Removal Tun Access W/ Port W/O FL    Standing Status:   Future    Standing Expiration Date:   05/01/2024    Order Specific Question:   Reason for exam:    Answer:   Patient now in remission, completed chemotherapy.  Requesting port removal    Order Specific Question:   Preferred Imaging Location?    Answer:   [facility]   All questions were answered. The patient knows to call the clinic with any problems, questions or concerns.  I have spent a total of 30 minutes minutes of face-to-face and non-face-to-face time, preparing to see the patient,performing a medically appropriate  examination, counseling and educating the patient, ordering medications/tests/procedures,documenting clinical information in the electronic health record, and care coordination.   Ulysees Barns, MD Department of Hematology/Oncology [facility] Phone: [phone] Pager: [phone] Email: [email or web address]  05/02/2023 8:54 PM

## 2023-04-27 ENCOUNTER — Inpatient Hospital Stay: Payer: BC Managed Care – PPO | Attending: Hematology and Oncology

## 2023-04-27 ENCOUNTER — Inpatient Hospital Stay (HOSPITAL_BASED_OUTPATIENT_CLINIC_OR_DEPARTMENT_OTHER): Payer: BC Managed Care – PPO | Attending: Hematology and Oncology | Admitting: Hematology and Oncology

## 2023-04-27 VITALS — BP 123/77 | HR 83 | Temp 98.2°F | Resp 17 | Ht 76.0 in | Wt 295.8 lb

## 2023-04-27 DIAGNOSIS — Z888 Allergy status to other drugs, medicaments and biological substances status: Secondary | ICD-10-CM | POA: Insufficient documentation

## 2023-04-27 DIAGNOSIS — K5900 Constipation, unspecified: Secondary | ICD-10-CM | POA: Insufficient documentation

## 2023-04-27 DIAGNOSIS — C8118 Nodular sclerosis classical Hodgkin lymphoma, lymph nodes of multiple sites: Secondary | ICD-10-CM

## 2023-04-27 DIAGNOSIS — Z21 Asymptomatic human immunodeficiency virus [HIV] infection status: Secondary | ICD-10-CM | POA: Insufficient documentation

## 2023-04-27 DIAGNOSIS — N183 Chronic kidney disease, stage 3 unspecified: Secondary | ICD-10-CM | POA: Insufficient documentation

## 2023-04-27 DIAGNOSIS — Z79899 Other long term (current) drug therapy: Secondary | ICD-10-CM | POA: Insufficient documentation

## 2023-04-27 DIAGNOSIS — C8174 Other classical Hodgkin lymphoma, lymph nodes of axilla and upper limb: Secondary | ICD-10-CM | POA: Diagnosis not present

## 2023-04-27 DIAGNOSIS — Z8249 Family history of ischemic heart disease and other diseases of the circulatory system: Secondary | ICD-10-CM | POA: Insufficient documentation

## 2023-04-27 DIAGNOSIS — Z95828 Presence of other vascular implants and grafts: Secondary | ICD-10-CM | POA: Diagnosis not present

## 2023-04-27 MED ORDER — HEPARIN SOD (PORK) LOCK FLUSH 100 UNIT/ML IV SOLN
500.0000 [IU] | Freq: Once | INTRAVENOUS | Status: AC | PRN
  Administered 2023-04-27: 500 [IU]

## 2023-04-27 MED ORDER — SODIUM CHLORIDE 0.9% FLUSH
10.0000 mL | INTRAVENOUS | Status: DC | PRN
  Administered 2023-04-27: 10 mL

## 2023-04-28 ENCOUNTER — Other Ambulatory Visit: Payer: Self-pay

## 2023-04-29 ENCOUNTER — Other Ambulatory Visit: Payer: Self-pay

## 2023-05-01 ENCOUNTER — Encounter: Payer: Self-pay | Admitting: Hematology and Oncology

## 2023-05-02 ENCOUNTER — Encounter: Payer: Self-pay | Admitting: Hematology and Oncology

## 2023-05-03 ENCOUNTER — Other Ambulatory Visit: Payer: Self-pay | Admitting: Hematology and Oncology

## 2023-05-03 DIAGNOSIS — C8118 Nodular sclerosis classical Hodgkin lymphoma, lymph nodes of multiple sites: Secondary | ICD-10-CM

## 2023-05-03 MED ORDER — SILDENAFIL CITRATE 50 MG PO TABS: 50.0000 mg | ORAL_TABLET | Freq: Every day | ORAL | 0 refills | Status: DC | PRN

## 2023-05-04 ENCOUNTER — Inpatient Hospital Stay: Payer: BC Managed Care – PPO

## 2023-05-04 DIAGNOSIS — Z21 Asymptomatic human immunodeficiency virus [HIV] infection status: Secondary | ICD-10-CM | POA: Diagnosis not present

## 2023-05-04 DIAGNOSIS — Z888 Allergy status to other drugs, medicaments and biological substances status: Secondary | ICD-10-CM | POA: Diagnosis not present

## 2023-05-04 DIAGNOSIS — Z79899 Other long term (current) drug therapy: Secondary | ICD-10-CM | POA: Diagnosis not present

## 2023-05-04 DIAGNOSIS — Z8249 Family history of ischemic heart disease and other diseases of the circulatory system: Secondary | ICD-10-CM | POA: Diagnosis not present

## 2023-05-04 DIAGNOSIS — C8174 Other classical Hodgkin lymphoma, lymph nodes of axilla and upper limb: Secondary | ICD-10-CM | POA: Diagnosis not present

## 2023-05-04 DIAGNOSIS — N183 Chronic kidney disease, stage 3 unspecified: Secondary | ICD-10-CM | POA: Diagnosis not present

## 2023-05-04 DIAGNOSIS — Z95828 Presence of other vascular implants and grafts: Secondary | ICD-10-CM

## 2023-05-04 DIAGNOSIS — K5900 Constipation, unspecified: Secondary | ICD-10-CM | POA: Diagnosis not present

## 2023-05-04 DIAGNOSIS — C8118 Nodular sclerosis classical Hodgkin lymphoma, lymph nodes of multiple sites: Secondary | ICD-10-CM

## 2023-05-04 LAB — CMP (CANCER CENTER ONLY)
ALT: 11 U/L (ref 0–44)
AST: 15 U/L (ref 15–41)
Albumin: 4.2 g/dL (ref 3.5–5.0)
Alkaline Phosphatase: 77 U/L (ref 38–126)
Anion gap: 4 — ABNORMAL LOW (ref 5–15)
BUN: 16 mg/dL (ref 6–20)
CO2: 26 mmol/L (ref 22–32)
Calcium: 9.4 mg/dL (ref 8.9–10.3)
Chloride: 108 mmol/L (ref 98–111)
Creatinine: 1.32 mg/dL — ABNORMAL HIGH (ref 0.61–1.24)
GFR, Estimated: >60 mL/min (ref >60)
Glucose, Bld: 82 mg/dL (ref 70–99)
Potassium: 3.9 mmol/L (ref 3.5–5.1)
Sodium: 138 mmol/L (ref 135–145)
Total Bilirubin: 0.7 mg/dL (ref 0.3–1.2)
Total Protein: 7.7 g/dL (ref 6.5–8.1)

## 2023-05-04 LAB — CBC WITH DIFFERENTIAL (CANCER CENTER ONLY)
Abs Immature Granulocytes: 0.02 10*3/uL (ref 0.00–0.07)
Basophils Absolute: 0 10*3/uL (ref 0.0–0.1)
Basophils Relative: 0 %
Eosinophils Absolute: 2 10*3/uL — ABNORMAL HIGH (ref 0.0–0.5)
Eosinophils Relative: 21 %
HCT: 38.1 % — ABNORMAL LOW (ref 39.0–52.0)
Hemoglobin: 13.6 g/dL (ref 13.0–17.0)
Immature Granulocytes: 0 %
Lymphocytes Relative: 19 %
Lymphs Abs: 1.8 10*3/uL (ref 0.7–4.0)
MCH: 30.4 pg (ref 26.0–34.0)
MCHC: 35.7 g/dL (ref 30.0–36.0)
MCV: 85 fL (ref 80.0–100.0)
Monocytes Absolute: 0.6 10*3/uL (ref 0.1–1.0)
Monocytes Relative: 6 %
Neutro Abs: 5.1 10*3/uL (ref 1.7–7.7)
Neutrophils Relative %: 54 %
Platelet Count: 188 10*3/uL (ref 150–400)
RBC: 4.48 MIL/uL (ref 4.22–5.81)
RDW: 14.9 % (ref 11.5–15.5)
WBC Count: 9.5 10*3/uL (ref 4.0–10.5)
nRBC: 0 % (ref 0.0–0.2)

## 2023-05-04 LAB — LACTATE DEHYDROGENASE: LDH: 379 U/L — ABNORMAL HIGH (ref 98–192)

## 2023-05-04 MED ORDER — SODIUM CHLORIDE 0.9% FLUSH
10.0000 mL | INTRAVENOUS | Status: DC | PRN
  Administered 2023-05-04: 10 mL

## 2023-05-04 MED ORDER — HEPARIN SOD (PORK) LOCK FLUSH 100 UNIT/ML IV SOLN
500.0000 [IU] | Freq: Once | INTRAVENOUS | Status: AC | PRN
  Administered 2023-05-04: 500 [IU]

## 2023-05-11 DIAGNOSIS — I10 Essential (primary) hypertension: Secondary | ICD-10-CM | POA: Diagnosis not present

## 2023-05-11 DIAGNOSIS — D539 Nutritional anemia, unspecified: Secondary | ICD-10-CM | POA: Diagnosis not present

## 2023-05-11 DIAGNOSIS — R5383 Other fatigue: Secondary | ICD-10-CM | POA: Diagnosis not present

## 2023-05-11 DIAGNOSIS — Z131 Encounter for screening for diabetes mellitus: Secondary | ICD-10-CM | POA: Diagnosis not present

## 2023-05-11 DIAGNOSIS — E559 Vitamin D deficiency, unspecified: Secondary | ICD-10-CM | POA: Diagnosis not present

## 2023-05-11 DIAGNOSIS — N1831 Chronic kidney disease, stage 3a: Secondary | ICD-10-CM | POA: Diagnosis not present

## 2023-05-11 DIAGNOSIS — Z79899 Other long term (current) drug therapy: Secondary | ICD-10-CM | POA: Diagnosis not present

## 2023-05-11 DIAGNOSIS — E7800 Pure hypercholesterolemia, unspecified: Secondary | ICD-10-CM | POA: Diagnosis not present

## 2023-05-11 DIAGNOSIS — E669 Obesity, unspecified: Secondary | ICD-10-CM | POA: Diagnosis not present

## 2023-05-16 DIAGNOSIS — Z79899 Other long term (current) drug therapy: Secondary | ICD-10-CM | POA: Diagnosis not present

## 2023-06-02 ENCOUNTER — Other Ambulatory Visit: Payer: Self-pay | Admitting: Adult Health

## 2023-06-05 NOTE — Telephone Encounter (Signed)
 Patient need to schedule for more refills.

## 2023-06-05 NOTE — Telephone Encounter (Signed)
Pt advised this needs to come from allergist. Pt has been scheduled for CPE.

## 2023-06-05 NOTE — Telephone Encounter (Signed)
Left message to return phone call.

## 2023-06-13 ENCOUNTER — Other Ambulatory Visit: Payer: Self-pay | Admitting: Adult Health

## 2023-06-13 DIAGNOSIS — N1831 Chronic kidney disease, stage 3a: Secondary | ICD-10-CM | POA: Diagnosis not present

## 2023-06-13 DIAGNOSIS — E669 Obesity, unspecified: Secondary | ICD-10-CM | POA: Diagnosis not present

## 2023-06-13 DIAGNOSIS — E559 Vitamin D deficiency, unspecified: Secondary | ICD-10-CM | POA: Diagnosis not present

## 2023-06-13 DIAGNOSIS — I10 Essential (primary) hypertension: Secondary | ICD-10-CM | POA: Diagnosis not present

## 2023-06-13 DIAGNOSIS — E7800 Pure hypercholesterolemia, unspecified: Secondary | ICD-10-CM | POA: Diagnosis not present

## 2023-06-14 ENCOUNTER — Other Ambulatory Visit: Payer: Self-pay | Admitting: Hematology and Oncology

## 2023-06-14 DIAGNOSIS — C8118 Nodular sclerosis classical Hodgkin lymphoma, lymph nodes of multiple sites: Secondary | ICD-10-CM

## 2023-06-14 NOTE — Telephone Encounter (Signed)
Pt advised this needs to come from allergist

## 2023-06-15 ENCOUNTER — Encounter: Payer: Self-pay | Admitting: Adult Health

## 2023-06-15 ENCOUNTER — Inpatient Hospital Stay: Payer: BC Managed Care – PPO | Attending: Hematology and Oncology

## 2023-06-15 ENCOUNTER — Ambulatory Visit (INDEPENDENT_AMBULATORY_CARE_PROVIDER_SITE_OTHER): Payer: BC Managed Care – PPO | Admitting: Adult Health

## 2023-06-15 VITALS — BP 132/82 | HR 104 | Temp 98.3°F | Ht 76.0 in | Wt 283.0 lb

## 2023-06-15 DIAGNOSIS — Z21 Asymptomatic human immunodeficiency virus [HIV] infection status: Secondary | ICD-10-CM | POA: Insufficient documentation

## 2023-06-15 DIAGNOSIS — C8174 Other classical Hodgkin lymphoma, lymph nodes of axilla and upper limb: Secondary | ICD-10-CM | POA: Diagnosis not present

## 2023-06-15 DIAGNOSIS — Z8249 Family history of ischemic heart disease and other diseases of the circulatory system: Secondary | ICD-10-CM | POA: Diagnosis not present

## 2023-06-15 DIAGNOSIS — N183 Chronic kidney disease, stage 3 unspecified: Secondary | ICD-10-CM | POA: Diagnosis not present

## 2023-06-15 DIAGNOSIS — C8118 Nodular sclerosis classical Hodgkin lymphoma, lymph nodes of multiple sites: Secondary | ICD-10-CM | POA: Diagnosis not present

## 2023-06-15 DIAGNOSIS — J309 Allergic rhinitis, unspecified: Secondary | ICD-10-CM | POA: Diagnosis not present

## 2023-06-15 DIAGNOSIS — K5900 Constipation, unspecified: Secondary | ICD-10-CM | POA: Insufficient documentation

## 2023-06-15 DIAGNOSIS — I10 Essential (primary) hypertension: Secondary | ICD-10-CM | POA: Diagnosis not present

## 2023-06-15 DIAGNOSIS — Z888 Allergy status to other drugs, medicaments and biological substances status: Secondary | ICD-10-CM | POA: Insufficient documentation

## 2023-06-15 DIAGNOSIS — Z79899 Other long term (current) drug therapy: Secondary | ICD-10-CM | POA: Insufficient documentation

## 2023-06-15 DIAGNOSIS — Z0000 Encounter for general adult medical examination without abnormal findings: Secondary | ICD-10-CM

## 2023-06-15 DIAGNOSIS — J4520 Mild intermittent asthma, uncomplicated: Secondary | ICD-10-CM

## 2023-06-15 DIAGNOSIS — Z95828 Presence of other vascular implants and grafts: Secondary | ICD-10-CM

## 2023-06-15 LAB — CBC WITH DIFFERENTIAL (CANCER CENTER ONLY)
Abs Immature Granulocytes: 0.01 10*3/uL (ref 0.00–0.07)
Basophils Absolute: 0 10*3/uL (ref 0.0–0.1)
Basophils Relative: 1 %
Eosinophils Absolute: 1.3 10*3/uL — ABNORMAL HIGH (ref 0.0–0.5)
Eosinophils Relative: 16 %
HCT: 41.9 % (ref 39.0–52.0)
Hemoglobin: 14.7 g/dL (ref 13.0–17.0)
Immature Granulocytes: 0 %
Lymphocytes Relative: 26 %
Lymphs Abs: 2.1 10*3/uL (ref 0.7–4.0)
MCH: 30.5 pg (ref 26.0–34.0)
MCHC: 35.1 g/dL (ref 30.0–36.0)
MCV: 86.9 fL (ref 80.0–100.0)
Monocytes Absolute: 0.4 10*3/uL (ref 0.1–1.0)
Monocytes Relative: 5 %
Neutro Abs: 4.2 10*3/uL (ref 1.7–7.7)
Neutrophils Relative %: 52 %
Platelet Count: 227 10*3/uL (ref 150–400)
RBC: 4.82 MIL/uL (ref 4.22–5.81)
RDW: 14.1 % (ref 11.5–15.5)
WBC Count: 7.9 10*3/uL (ref 4.0–10.5)
nRBC: 0 % (ref 0.0–0.2)

## 2023-06-15 LAB — CMP (CANCER CENTER ONLY)
ALT: 10 U/L (ref 0–44)
AST: 13 U/L — ABNORMAL LOW (ref 15–41)
Albumin: 4 g/dL (ref 3.5–5.0)
Alkaline Phosphatase: 93 U/L (ref 38–126)
Anion gap: 5 (ref 5–15)
BUN: 14 mg/dL (ref 6–20)
CO2: 22 mmol/L (ref 22–32)
Calcium: 9.3 mg/dL (ref 8.9–10.3)
Chloride: 111 mmol/L (ref 98–111)
Creatinine: 1.49 mg/dL — ABNORMAL HIGH (ref 0.61–1.24)
GFR, Estimated: >60 mL/min (ref >60)
Glucose, Bld: 91 mg/dL (ref 70–99)
Potassium: 3.9 mmol/L (ref 3.5–5.1)
Sodium: 138 mmol/L (ref 135–145)
Total Bilirubin: 0.5 mg/dL (ref <1.2)
Total Protein: 8.2 g/dL — ABNORMAL HIGH (ref 6.5–8.1)

## 2023-06-15 LAB — COMPREHENSIVE METABOLIC PANEL
ALT: 12 U/L (ref 0–53)
AST: 16 U/L (ref 0–37)
Albumin: 4.3 g/dL (ref 3.5–5.2)
Alkaline Phosphatase: 96 U/L (ref 39–117)
BUN: 14 mg/dL (ref 6–23)
CO2: 21 meq/L (ref 19–32)
Calcium: 9.5 mg/dL (ref 8.4–10.5)
Chloride: 109 meq/L (ref 96–112)
Creatinine, Ser: 1.56 mg/dL — ABNORMAL HIGH (ref 0.40–1.50)
GFR: 55.28 mL/min — ABNORMAL LOW (ref >60.00)
Glucose, Bld: 86 mg/dL (ref 70–99)
Potassium: 3.9 meq/L (ref 3.5–5.1)
Sodium: 138 meq/L (ref 135–145)
Total Bilirubin: 0.5 mg/dL (ref 0.2–1.2)
Total Protein: 8.5 g/dL — ABNORMAL HIGH (ref 6.0–8.3)

## 2023-06-15 LAB — LIPID PANEL
Cholesterol: 190 mg/dL (ref 0–200)
HDL: 30.9 mg/dL — ABNORMAL LOW (ref >39.00)
LDL Cholesterol: 143 mg/dL — ABNORMAL HIGH (ref 0–99)
NonHDL: 159.08
Total CHOL/HDL Ratio: 6
Triglycerides: 80 mg/dL (ref 0.0–149.0)
VLDL: 16 mg/dL (ref 0.0–40.0)

## 2023-06-15 LAB — CBC
HCT: 44.4 % (ref 39.0–52.0)
Hemoglobin: 15.2 g/dL (ref 13.0–17.0)
MCHC: 34.2 g/dL (ref 30.0–36.0)
MCV: 90.1 fL (ref 78.0–100.0)
Platelets: 238 10*3/uL (ref 150.0–400.0)
RBC: 4.93 Mil/uL (ref 4.22–5.81)
RDW: 15.3 % (ref 11.5–15.5)
WBC: 7.5 10*3/uL (ref 4.0–10.5)

## 2023-06-15 LAB — LACTATE DEHYDROGENASE: LDH: 277 U/L — ABNORMAL HIGH (ref 98–192)

## 2023-06-15 LAB — TSH: TSH: 1.73 u[IU]/mL (ref 0.35–5.50)

## 2023-06-15 MED ORDER — HEPARIN SOD (PORK) LOCK FLUSH 100 UNIT/ML IV SOLN
500.0000 [IU] | Freq: Once | INTRAVENOUS | Status: AC | PRN
  Administered 2023-06-15: 500 [IU]

## 2023-06-15 MED ORDER — SODIUM CHLORIDE 0.9% FLUSH
10.0000 mL | INTRAVENOUS | Status: DC | PRN
  Administered 2023-06-15: 10 mL

## 2023-06-15 NOTE — Progress Notes (Signed)
Subjective:    Patient ID: Lee [name], male    DOB: 1981-05-19, 42 y.o.   MRN: [id number]  HPI Patient presents for yearly preventative medicine examination. He is a pleasant 42 year old male who  has a past medical history of CKD (chronic kidney disease) stage 3, GFR 30-59 ml/min (HCC) (02/20/2019), Eczema, Environmental allergies, Essential hypertension, HIV test positive (HCC), 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HCC) (07/24/2016), and Shingles.  Nodular Sclerosis Hodgkin's Lymphoma,-he had a CT scan done in November 2023 which showed bulky adenopathy in the deep right pectoral region.  He then had a CT of the chest and December 2024 which showed bulky right axillary lymph node enlargement with nodule mass measuring 7 cm in length.  In February 2024 he had an ultrasound-guided biopsy of the right axilla lymphadenopathy which was consistent with classical Hodgkin's lymphoma.  This seen by Dr. Leonides Schanz completed chemotherapy.  His mid treatment PET/CT on 12/19/2022 showed excellent response to treatment.  HIV -diagnosed in January 2018.  He is currently maintained on [name].  He is seen by ID on a routine basis.  Hypertension- managed with Norvasc 10 mg daily.  He denies dizziness, lightheadedness, blurred vision, chest pain, or shortness of breath BP Readings from Last 3 Encounters:  06/15/23 (!) 130/90  04/27/23 123/77  04/04/23 137/82    Seasonal Allergies with asthma-managed by [facility].  Currently managed with albuterol rescue inhaler, Breo Ellipta daily inhaler, Singulair, and Astelin nasal spray  Obesity - recently started seeing Courtney Paris and was started on Phentermine. He has also started eating healthier and going to the gym more frequently..   All immunizations and health maintenance protocols were reviewed with the patient and needed orders were placed.  Appropriate screening laboratory values were ordered for the patient including screening of  hyperlipidemia, renal function and hepatic function.  Medication reconciliation,  past medical history, social history, problem list and allergies were reviewed in detail with the patient  Goals were established with regard to weight loss, exercise, and  diet in compliance with medications Wt Readings from Last 3 Encounters:  06/15/23 283 lb (128.4 kg)  04/27/23 295 lb 12.8 oz (134.2 kg)  04/04/23 288 lb 1.6 oz (130.7 kg)    Review of Systems  Constitutional: Negative.   HENT: Negative.    Eyes: Negative.   Respiratory: Negative.    Cardiovascular: Negative.   Gastrointestinal: Negative.   Endocrine: Negative.   Genitourinary: Negative.   Musculoskeletal: Negative.   Skin: Negative.   Allergic/Immunologic: Negative.   Neurological: Negative.   Hematological: Negative.   Psychiatric/Behavioral: Negative.    All other systems reviewed and are negative.  Past Medical History:  Diagnosis Date   CKD (chronic kidney disease) stage 3, GFR 30-59 ml/min (HCC) 02/20/2019   Eczema    Environmental allergies    Essential hypertension    HIV test positive (HCC)    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HCC) 07/24/2016   Shingles     Social History   Socioeconomic History   Marital status: Single    Spouse name: Not on file   Number of children: Not on file   Years of education: Not on file   Highest education level: Associate degree: academic program  Occupational History   Not on file  Tobacco Use   Smoking status: Never   Smokeless tobacco: Never  Vaping Use   Vaping status: Never Used  Substance and Sexual Activity   Alcohol use: No  Drug use: No   Sexual activity: Not Currently    Partners: Male    Birth control/protection: Condom    Comment: Given Condoms  Other Topics Concern   Not on file  Social History Narrative   Works as Acupuncturist    -Not married   Social Determinants of Health   Financial Resource Strain: Low Risk  (06/11/2023)   Overall Financial  Resource Strain (CARDIA)    Difficulty of Paying Living Expenses: Not hard at all  Food Insecurity: No Food Insecurity (06/11/2023)   Hunger Vital Sign    Worried About Running Out of Food in the Last Year: Never true    Ran Out of Food in the Last Year: Never true  Transportation Needs: No Transportation Needs (06/11/2023)   PRAPARE - Administrator, Civil Service (Medical): No    Lack of Transportation (Non-Medical): No  Physical Activity: Sufficiently Active (06/11/2023)   Exercise Vital Sign    Days of Exercise per Week: 5 days    Minutes of Exercise per Session: 50 min  Stress: Stress Concern Present (06/11/2023)   [employer] of Occupational Health - Occupational Stress Questionnaire    Feeling of Stress : Rather much  Social Connections: Moderately Integrated (06/11/2023)   Social Connection and Isolation Panel [NHANES]    Frequency of Communication with Friends and Family: More than three times a week    Frequency of Social Gatherings with Friends and Family: Twice a week    Attends Religious Services: More than 4 times per year    Active Member of Clubs or Organizations: Yes    Attends Engineer, structural: More than 4 times per year    Marital Status: Never married  Intimate Partner Violence: Not on file    Past Surgical History:  Procedure Laterality Date   CHOLECYSTECTOMY N/A 12/30/2021   Procedure: LAPAROSCOPIC CHOLECYSTECTOMY WITH  CHOLANGIOGRAM;  Surgeon: Darnell Level, MD;  Location: [facility];  Service: General;  Laterality: N/A;   IR IMAGING GUIDED PORT INSERTION  10/18/2022   LAPAROSCOPIC APPENDECTOMY N/A 03/12/2020   Procedure: APPENDECTOMY LAPAROSCOPIC;  Surgeon: Romie Levee, MD;  Location: [facility];  Service: General;  Laterality: N/A;   TYMPANOSTOMY TUBE PLACEMENT     WISDOM TOOTH EXTRACTION     WRIST SURGERY      Family History  Problem Relation Age of Onset   Hypercalcemia Mother    Hypertension Mother    Hypertension Father     Prostate cancer Father    Multiple myeloma Father     Allergies  Allergen Reactions   Gabapentin Nausea And Vomiting   Lisinopril Other (See Comments)    Dry cough     Nickel Rash    Current Outpatient Medications on File Prior to Visit  Medication Sig Dispense Refill   acetaminophen (TYLENOL) 650 MG CR tablet Take 650 mg by mouth every 8 (eight) hours as needed for pain.     albuterol (PROVENTIL HFA;VENTOLIN HFA) 108 (90 Base) MCG/ACT inhaler Inhale 2 puffs into the lungs every 6 (six) hours as needed for wheezing or shortness of breath. 1 Inhaler 1   amLODipine (NORVASC) 10 MG tablet Take 1 tablet by mouth once daily 90 tablet 3   azelastine (ASTELIN) 0.1 % nasal spray 1-2 puffs in each nostril twice daily     azelastine (OPTIVAR) 0.05 % ophthalmic solution Apply to eye.     [name] 50-200-25 MG TABS tablet TAKE 1 TABLET DAILY 30 tablet 5   BREO  ELLIPTA 200-25 MCG/ACT AEPB Inhale 1 puff into the lungs daily.     EPINEPHrine 0.3 mg/0.3 mL IJ SOAJ injection Inject 0.3 mg into the muscle once.     lidocaine-prilocaine (EMLA) cream Apply 1 Application topically as needed. 30 g 0   loratadine (CLARITIN) 10 MG tablet Take 10 mg by mouth daily.     montelukast (SINGULAIR) 10 MG tablet Take 10 mg by mouth at bedtime.     mupirocin ointment (BACTROBAN) 2 % Apply 1 Application topically 2 (two) times daily. Apply topical to the affected area twice a day until clear 30 g 3   ondansetron (ZOFRAN) 8 MG tablet Take 1 tablet (8 mg total) by mouth every 8 (eight) hours as needed. 30 tablet 0   ondansetron (ZOFRAN-ODT) 8 MG disintegrating tablet Take 1 tablet (8 mg total) by mouth every 8 (eight) hours as needed for nausea or vomiting. 30 tablet 0   oxyCODONE (OXYCONTIN) 10 mg 12 hr tablet Take 1 tablet (10 mg total) by mouth every 12 (twelve) hours. 14 tablet 0   oxyCODONE 10 MG TABS Take 1 tablet (10 mg total) by mouth every 4 (four) hours as needed for severe pain. 90 tablet 0   phentermine 30 MG  capsule Take 30 mg by mouth daily.     prochlorperazine (COMPAZINE) 10 MG tablet Take 1 tablet (10 mg total) by mouth every 6 (six) hours as needed for nausea or vomiting. 30 tablet 0   sildenafil (VIAGRA) 50 MG tablet Take 1 tablet (50 mg total) by mouth daily as needed for erectile dysfunction. 20 tablet 0   topiramate (TOPAMAX) 50 MG tablet Take 50 mg by mouth daily.     Vitamin D, Ergocalciferol, (DRISDOL) 1.25 MG (50000 UNIT) CAPS capsule Take 50,000 Units by mouth once a week.     No current facility-administered medications on file prior to visit.    BP (!) 130/90   Pulse (!) 104   Temp 98.3 F (36.8 C) (Oral)   Ht 6\' 4"  (1.93 m)   Wt 283 lb (128.4 kg)   SpO2 96%   BMI 34.45 kg/m       Objective:   Physical Exam Vitals and nursing note reviewed.  Constitutional:      General: He is not in acute distress.    Appearance: Normal appearance. He is obese. He is not ill-appearing.  HENT:     Head: Normocephalic and atraumatic.     Right Ear: Tympanic membrane, ear canal and external ear normal. There is no impacted cerumen.     Left Ear: Tympanic membrane, ear canal and external ear normal. There is no impacted cerumen.     Nose: Nose normal. No congestion or rhinorrhea.     Mouth/Throat:     Mouth: Mucous membranes are moist.     Pharynx: Oropharynx is clear.  Eyes:     Extraocular Movements: Extraocular movements intact.     Conjunctiva/sclera: Conjunctivae normal.     Pupils: Pupils are equal, round, and reactive to light.  Neck:     Vascular: No carotid bruit.  Cardiovascular:     Rate and Rhythm: Normal rate and regular rhythm.     Pulses: Normal pulses.     Heart sounds: No murmur heard.    No friction rub. No gallop.  Pulmonary:     Effort: Pulmonary effort is normal.     Breath sounds: Normal breath sounds.  Abdominal:     General: Abdomen is flat. Bowel sounds  are normal. There is no distension.     Palpations: Abdomen is soft. There is no mass.      Tenderness: There is no abdominal tenderness. There is no guarding or rebound.     Hernia: No hernia is present.  Musculoskeletal:        General: Normal range of motion.     Cervical back: Normal range of motion and neck supple.  Lymphadenopathy:     Cervical: No cervical adenopathy.  Skin:    General: Skin is warm and dry.     Capillary Refill: Capillary refill takes less than 2 seconds.  Neurological:     General: No focal deficit present.     Mental Status: He is alert and oriented to person, place, and time.  Psychiatric:        Mood and Affect: Mood normal.        Behavior: Behavior normal.        Thought Content: Thought content normal.        Judgment: Judgment normal.       Assessment & Plan:   1. Routine general medical examination at a health care facility Today patient counseled on age appropriate routine health concerns for screening and prevention, each reviewed and up to date or declined. Immunizations reviewed and up to date or declined. Labs ordered and reviewed. Risk factors for depression reviewed and negative. Hearing function and visual acuity are intact. ADLs screened and addressed as needed. Functional ability and level of safety reviewed and appropriate. Education, counseling and referrals performed based on assessed risks today. Patient provided with a copy of personalized plan for preventive services. - Continue to work on weight loss through diet and exercise - Follow up in one year or sooner if needed  2. 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HCC) - Per ID  - Lipid panel; Future - TSH; Future - CBC; Future - Comprehensive metabolic panel; Future  4. Essential hypertension - Well controlled. No change in medication  - Lipid panel; Future - TSH; Future - CBC;  Future - Comprehensive metabolic panel; Future  5. Allergic rhinitis, unspecified seasonality, unspecified trigger - per allergy and asthma   6. Mild intermittent asthma without complication - Per allergy and asthma    Shirline Frees, NP

## 2023-06-15 NOTE — Patient Instructions (Addendum)
It was great seeing you today   We will follow up with you regarding your lab work   Please let me know if you need anything

## 2023-06-16 ENCOUNTER — Other Ambulatory Visit: Payer: Self-pay

## 2023-06-19 ENCOUNTER — Other Ambulatory Visit: Payer: Self-pay | Admitting: Hematology and Oncology

## 2023-06-20 ENCOUNTER — Encounter: Payer: Self-pay | Admitting: Hematology and Oncology

## 2023-06-20 MED ORDER — SILDENAFIL CITRATE 50 MG PO TABS: 50.0000 mg | ORAL_TABLET | Freq: Every day | ORAL | 0 refills | Status: DC | PRN

## 2023-06-27 ENCOUNTER — Other Ambulatory Visit: Payer: Self-pay | Admitting: Adult Health

## 2023-07-03 ENCOUNTER — Other Ambulatory Visit (HOSPITAL_COMMUNITY): Payer: Self-pay

## 2023-07-03 ENCOUNTER — Encounter: Payer: Self-pay | Admitting: Hematology and Oncology

## 2023-07-03 MED ORDER — BREO ELLIPTA 200-25 MCG/ACT IN AEPB
1.0000 | INHALATION_SPRAY | Freq: Every day | RESPIRATORY_TRACT | 0 refills | Status: DC
  Filled 2023-07-03: qty 60, 30d supply, fill #0

## 2023-07-03 NOTE — Telephone Encounter (Signed)
Okay for refill?  

## 2023-07-06 ENCOUNTER — Other Ambulatory Visit (HOSPITAL_COMMUNITY): Payer: Self-pay

## 2023-07-13 DIAGNOSIS — E7800 Pure hypercholesterolemia, unspecified: Secondary | ICD-10-CM | POA: Diagnosis not present

## 2023-07-13 DIAGNOSIS — I10 Essential (primary) hypertension: Secondary | ICD-10-CM | POA: Diagnosis not present

## 2023-07-13 DIAGNOSIS — E669 Obesity, unspecified: Secondary | ICD-10-CM | POA: Diagnosis not present

## 2023-07-13 DIAGNOSIS — C8590 Non-Hodgkin lymphoma, unspecified, unspecified site: Secondary | ICD-10-CM | POA: Diagnosis not present

## 2023-07-13 DIAGNOSIS — N1831 Chronic kidney disease, stage 3a: Secondary | ICD-10-CM | POA: Diagnosis not present

## 2023-07-13 DIAGNOSIS — E559 Vitamin D deficiency, unspecified: Secondary | ICD-10-CM | POA: Diagnosis not present

## 2023-07-26 ENCOUNTER — Other Ambulatory Visit: Payer: Self-pay | Admitting: Hematology and Oncology

## 2023-07-26 DIAGNOSIS — C8118 Nodular sclerosis classical Hodgkin lymphoma, lymph nodes of multiple sites: Secondary | ICD-10-CM

## 2023-07-26 NOTE — Progress Notes (Signed)
 [facility] [phone]   [phone]  PROGRESS NOTE  Patient Care Team: Merna Huxley, NP as PCP - General (Family Medicine) Frutoso Luz, MD as Referring Physician (Allergy) Federico Norleen ONEIDA MADISON, MD as Consulting Physician (Hematology and Oncology)  Hematological/Oncological History # Hodgkin's Lymphoma, Stage III 06/08/2022: CT neck showed Bulky adenopathy in the deep right pectoral region, recommend chest CT with contrast. There was splenomegaly on abdominal CT from the summer, consider addition of abdominal CT is well. 07/01/2023: CT chest showed Bulky RIGHT axillary lymph node enlargement, with nodal mass measuring 7 cm in length. Findings suspicious for lymphoproliferative disease 09/13/2022: US  guided biopsy of right axillary lymphadenopathy was consistent with classical Hodgkin's lymphoma  10/06/2022: establish care with Dr. Federico  10/31/2022: Cycle 1 Day 1 of A-AVD chemotherapy.  11/14/2022: Cycle 1 Day 15 of A-AVD chemotherapy 11/29/2022: Cycle 2 Day 1 of A-AVD chemotherapy 12/13/2022: Cycle 2 Day 15 of A-AVD chemotherapy (holding brentuximab and Udenyca  due to worsening skin lesions from hidradenitis suppurativa) 12/19/2022: CT PET Study showed near complete metabolic response with residual 18 mm short axis right axillary node (Deauville criteria 2). 12/27/2022: Cycle 3 Day 1 of AVD chemotherapy 01/10/2023: Cycle 3 Day 15 of AVD chemotherapy 01/23/2023:  Cycle 4 Day 1 of AVD chemotherapy 02/06/2023: Cycle 4 Day 15 of AVD chemotherapy 02/20/2023: Cycle 5 Day 1 of AVD chemotherapy 03/07/2023: Cycle 5 Day 15 of AVD chemotherapy 03/20/2023: Cycle 6 Day 1 of AVD chemotherapy 04/04/2023: Cycle 6 Day 15 of AVD chemotherapy 04/18/2023: post treatment PET CT scan shows complete response to therapy.   Interval History:  Lee [name] 43 y.o. male with medical history significant for stage III nodular sclerosing Hodgkin's lymphoma who presents for a follow up  visit. The patient's last visit was on 04/27/2023. In the interim since the last visit he has had no major changes in his health.  On exam today Lee [name] reports he is having some issues with allergies.  He reports he has a dry cough and has been using his asthma inhaler.  He reports he is also coughing up some whitish/clear phlegm.  He notes that he is quite prone to allergies and this is normal for him this time a year.  He reports that his energy levels have been quite good and he has been going to the gym frequently.  He notes that his appetite has been strong and is almost back to 100%.  He notes he is getting there.  He notes he is doing his best to try to do cardio and weightlifting at the gym.  He does this about 4-5 times per week.  He notes he has not noticed any enlarged lymph nodes.  He is also had no other issues with his skin.  He reports that he unfortunately has lost his sexual desire and libido.  He reports that otherwise he has been at his baseline level of health and has no questions concerns or complaints today.SABRA He otherwise denies any fevers, chills, sweats, nausea vomiting or diarrhea.  Full 10 point ROS is otherwise negative.    MEDICAL HISTORY:  Past Medical History:  Diagnosis Date   CKD (chronic kidney disease) stage 3, GFR 30-59 ml/min (HCC) 02/20/2019   Eczema    Environmental allergies    Essential hypertension    HIV test positive (HCC)    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HCC) 07/24/2016   Shingles     SURGICAL HISTORY: Past Surgical History:  Procedure Laterality  Date   CHOLECYSTECTOMY N/A 12/30/2021   Procedure: LAPAROSCOPIC CHOLECYSTECTOMY WITH  CHOLANGIOGRAM;  Surgeon: Eletha Boas, MD;  Location: [facility];  Service: General;  Laterality: N/A;   IR IMAGING GUIDED PORT INSERTION  10/18/2022   LAPAROSCOPIC APPENDECTOMY N/A 03/12/2020   Procedure: APPENDECTOMY LAPAROSCOPIC;  Surgeon: Debby Hila, MD;  Location: [facility];  Service: General;  Laterality:  N/A;   TYMPANOSTOMY TUBE PLACEMENT     WISDOM TOOTH EXTRACTION     WRIST SURGERY      SOCIAL HISTORY: Social History   Socioeconomic History   Marital status: Single    Spouse name: Not on file   Number of children: Not on file   Years of education: Not on file   Highest education level: Associate degree: academic program  Occupational History   Not on file  Tobacco Use   Smoking status: Never   Smokeless tobacco: Never  Vaping Use   Vaping status: Never Used  Substance and Sexual Activity   Alcohol  use: No   Drug use: No   Sexual activity: Not Currently    Partners: Male    Birth control/protection: Condom    Comment: Given Condoms  Other Topics Concern   Not on file  Social History Narrative   Works as ACUPUNCTURIST    -Not married   Social Drivers of Corporate Investment Banker Strain: Low Risk  (06/11/2023)   Overall Financial Resource Strain (CARDIA)    Difficulty of Paying Living Expenses: Not hard at all  Food Insecurity: No Food Insecurity (06/11/2023)   Hunger Vital Sign    Worried About Running Out of Food in the Last Year: Never true    Ran Out of Food in the Last Year: Never true  Transportation Needs: No Transportation Needs (06/11/2023)   PRAPARE - Administrator, Civil Service (Medical): No    Lack of Transportation (Non-Medical): No  Physical Activity: Sufficiently Active (06/11/2023)   Exercise Vital Sign    Days of Exercise per Week: 5 days    Minutes of Exercise per Session: 50 min  Stress: Stress Concern Present (06/11/2023)   [employer] of Occupational Health - Occupational Stress Questionnaire    Feeling of Stress : Rather much  Social Connections: Moderately Integrated (06/11/2023)   Social Connection and Isolation Panel [NHANES]    Frequency of Communication with Friends and Family: More than three times a week    Frequency of Social Gatherings with Friends and Family: Twice a week    Attends Religious Services: More than 4  times per year    Active Member of [employer] or Organizations: Yes    Attends Engineer, Structural: More than 4 times per year    Marital Status: Never married  Catering Manager Violence: Not on file    FAMILY HISTORY: Family History  Problem Relation Age of Onset   Hypercalcemia Mother    Hypertension Mother    Hypertension Father    Prostate cancer Father    Multiple myeloma Father     ALLERGIES:  is allergic to gabapentin , lisinopril , and nickel.  MEDICATIONS:  Current Outpatient Medications  Medication Sig Dispense Refill   acetaminophen  (TYLENOL ) 650 MG CR tablet Take 650 mg by mouth every 8 (eight) hours as needed for pain.     albuterol  (PROVENTIL  HFA;VENTOLIN  HFA) 108 (90 Base) MCG/ACT inhaler Inhale 2 puffs into the lungs every 6 (six) hours as needed for wheezing or shortness of breath. 1 Inhaler 1   amLODipine  (  NORVASC ) 10 MG tablet Take 1 tablet by mouth once daily 90 tablet 3   azelastine  (ASTELIN ) 0.1 % nasal spray 1-2 puffs in each nostril twice daily     azelastine  (OPTIVAR ) 0.05 % ophthalmic solution Apply to eye.     [name]  50-200-25 MG TABS tablet TAKE 1 TABLET DAILY 30 tablet 5   BREO ELLIPTA  200-25 MCG/ACT AEPB Inhale 1 puff into the lungs daily. 60 each 0   EPINEPHrine  0.3 mg/0.3 mL IJ SOAJ injection Inject 0.3 mg into the muscle once.     lidocaine -prilocaine  (EMLA ) cream Apply 1 Application topically as needed. 30 g 0   loratadine  (CLARITIN ) 10 MG tablet Take 10 mg by mouth daily.     montelukast  (SINGULAIR ) 10 MG tablet Take 10 mg by mouth at bedtime.     mupirocin  ointment (BACTROBAN ) 2 % Apply 1 Application topically 2 (two) times daily. Apply topical to the affected area twice a day until clear 30 g 3   ondansetron  (ZOFRAN ) 8 MG tablet Take 1 tablet (8 mg total) by mouth every 8 (eight) hours as needed. 30 tablet 0   ondansetron  (ZOFRAN -ODT) 8 MG disintegrating tablet Take 1 tablet (8 mg total) by mouth every 8 (eight) hours as needed for nausea  or vomiting. 30 tablet 0   oxyCODONE  (OXYCONTIN ) 10 mg 12 hr tablet Take 1 tablet (10 mg total) by mouth every 12 (twelve) hours. 14 tablet 0   oxyCODONE  10 MG TABS Take 1 tablet (10 mg total) by mouth every 4 (four) hours as needed for severe pain. 90 tablet 0   phentermine  30 MG capsule Take 30 mg by mouth daily.     prochlorperazine  (COMPAZINE ) 10 MG tablet Take 1 tablet (10 mg total) by mouth every 6 (six) hours as needed for nausea or vomiting. 30 tablet 0   sildenafil  (VIAGRA ) 50 MG tablet Take 1 tablet (50 mg total) by mouth daily as needed for erectile dysfunction. 20 tablet 0   topiramate  (TOPAMAX ) 50 MG tablet Take 50 mg by mouth daily.     Vitamin D, Ergocalciferol, (DRISDOL) 1.25 MG (50000 UNIT) CAPS capsule Take 50,000 Units by mouth once a week.     No current facility-administered medications for this visit.    REVIEW OF SYSTEMS:   Constitutional: ( - ) fevers, ( - )  chills , ( - ) night sweats Eyes: ( - ) blurriness of vision, ( - ) double vision, ( - ) watery eyes Ears, nose, mouth, throat, and face: ( - ) mucositis, ( - ) sore throat Respiratory: ( - ) cough, ( - ) dyspnea, ( - ) wheezes Cardiovascular: ( - ) palpitation, ( - ) chest discomfort, ( - ) lower extremity swelling Gastrointestinal:  ( - ) nausea, ( - ) heartburn, ( - ) change in bowel habits Skin: ( - ) abnormal skin rashes Lymphatics: ( - ) new lymphadenopathy, ( - ) easy bruising Neurological: ( - ) numbness, ( - ) tingling, ( - ) new weaknesses Behavioral/Psych: ( - ) mood change, ( - ) new changes  All other systems were reviewed with the patient and are negative.  PHYSICAL EXAMINATION: ECOG PERFORMANCE STATUS: 1 - Symptomatic but completely ambulatory  There were no vitals filed for this visit.    There were no vitals filed for this visit.     GENERAL: Well-appearing young African-American male, alert, no distress and comfortable SKIN: skin color, texture, turgor are normal, no rashes. EYES:  conjunctiva are pink  and non-injected, sclera clear LUNGS: clear to auscultation and percussion with normal breathing effort HEART: regular rate & rhythm and no murmurs and no lower extremity edema Musculoskeletal: no cyanosis of digits and no clubbing  PSYCH: alert & oriented x 3, fluent speech NEURO: no focal motor/sensory deficits  LABORATORY DATA:  I have reviewed the data as listed    Latest Ref Rng & Units 07/27/2023   10:08 AM 06/15/2023   10:03 AM 06/15/2023    8:19 AM  CBC  WBC 4.0 - 10.5 K/uL 7.2  7.9  7.5   Hemoglobin 13.0 - 17.0 g/dL 84.2  85.2  84.7   Hematocrit 39.0 - 52.0 % 43.2  41.9  44.4   Platelets 150 - 400 K/uL 188  227  238.0        Latest Ref Rng & Units 07/27/2023   10:08 AM 06/15/2023   10:03 AM 06/15/2023    8:19 AM  CMP  Glucose 70 - 99 mg/dL 87  91  86   BUN 6 - 20 mg/dL 15  14  14    Creatinine 0.61 - 1.24 mg/dL 8.62  8.50  8.43   Sodium 135 - 145 mmol/L 137  138  138   Potassium 3.5 - 5.1 mmol/L 3.7  3.9  3.9   Chloride 98 - 111 mmol/L 111  111  109   CO2 22 - 32 mmol/L 22  22  21    Calcium 8.9 - 10.3 mg/dL 9.0  9.3  9.5   Total Protein 6.5 - 8.1 g/dL 8.4  8.2  8.5   Total Bilirubin 0.0 - 1.2 mg/dL 0.5  0.5  0.5   Alkaline Phos 38 - 126 U/L 89  93  96   AST 15 - 41 U/L 13  13  16    ALT 0 - 44 U/L 11  10  12       RADIOGRAPHIC STUDIES: I have personally reviewed the radiological images as listed and agreed with the findings in the report: FDG avid lymph nodes on both sides of the diaphragm, most prominently in the cervical regions and axilla.  Consistent with at least stage III disease. No results found.  ASSESSMENT & PLAN Lee [name] is a 43 y.o. male with medical history significant for stage III nodular sclerosing Hodgkin's lymphoma who presents for a follow up visit.    # Hodgkin's Lymphoma, Stage III -- PET CT scan staging complete, findings consistent with stage III Hodgkin's lymphoma. --Will plan to proceed with A-AVD  chemotherapy. --Echocardiogram complete, shows excellent baseline cardiac function. --Mid treatment PET CT scan on 12/19/2022 showed excellent response to treatment (Deauville 2).  --Due to worsening skin lesions that is likely flaring up from Udenyca  injection, we held Brentuximab and Udenyca  injection starting Cycle 2, Day 15.  Plan:  --Labs previously showed Hgb 15.7, WBC 7.2, MCV 84.9, Plt 188. Creatinine is stable at 1.37, LFTs normal.  -- Patient has completed 6 cycles of A-AVD chemotherapy.  --Post treatment PET scan shows no evidence of residual or recurrent disease. --Strict neutropenic precautions given including monitor for fevers --Proceed with treatment without any further dose modifications.  --RTC in 3 months time with repeat CT scan (April 2025)  #Hidradenitis Suppurativa--resolved --Under the care of dermatology and relates flare ups to GCSF injection which has greatly improved since discontinuing GCSF.  --Continue with oral doxycycline  and intralesional kenalog  as needed  #Constipation-stable: --Likely secondary to opoid pain medication --Recommend to try OTC stool softeners such as senna  or miralax .    #Supportive Care -- chemotherapy education complete -- port placed.  Can be removed if no concerning findings on upcoming PET CT scan. -- zofran  8mg  q8H PRN and compazine  10mg  PO q6H for nausea -- EMLA  cream for port.  -- no pain medication required at this time.   Orders Placed This Encounter  Procedures   CT CHEST ABDOMEN PELVIS W CONTRAST    Standing Status:   Future    Expected Date:   10/15/2023    Expiration Date:   07/28/2024    If indicated for the ordered procedure, I authorize the administration of contrast media per Radiology protocol:   Yes    Does the patient have a contrast media/X-ray dye allergy?:   No    Preferred imaging location?:   [facility]    If indicated for the ordered procedure, I authorize the administration of oral contrast media  per Radiology protocol:   Yes   All questions were answered. The patient knows to call the clinic with any problems, questions or concerns.  I have spent a total of 25 minutes minutes of face-to-face and non-face-to-face time, preparing to see the patient,performing a medically appropriate examination, counseling and educating the patient, ordering medications/tests/procedures,documenting clinical information in the electronic health record, and care coordination.   Norleen IVAR Kidney, MD Department of Hematology/Oncology [facility] Phone: [phone] Pager: [phone] Email: [email or web address]  07/29/2023 12:04 PM

## 2023-07-27 ENCOUNTER — Inpatient Hospital Stay: Payer: BC Managed Care – PPO | Attending: Hematology and Oncology | Admitting: Hematology and Oncology

## 2023-07-27 ENCOUNTER — Inpatient Hospital Stay: Payer: BC Managed Care – PPO

## 2023-07-27 DIAGNOSIS — Z95828 Presence of other vascular implants and grafts: Secondary | ICD-10-CM | POA: Diagnosis not present

## 2023-07-27 DIAGNOSIS — Z8249 Family history of ischemic heart disease and other diseases of the circulatory system: Secondary | ICD-10-CM | POA: Insufficient documentation

## 2023-07-27 DIAGNOSIS — C8174 Other classical Hodgkin lymphoma, lymph nodes of axilla and upper limb: Secondary | ICD-10-CM | POA: Insufficient documentation

## 2023-07-27 DIAGNOSIS — Z452 Encounter for adjustment and management of vascular access device: Secondary | ICD-10-CM | POA: Diagnosis present

## 2023-07-27 DIAGNOSIS — C8118 Nodular sclerosis classical Hodgkin lymphoma, lymph nodes of multiple sites: Secondary | ICD-10-CM

## 2023-07-27 DIAGNOSIS — Z888 Allergy status to other drugs, medicaments and biological substances status: Secondary | ICD-10-CM | POA: Diagnosis not present

## 2023-07-27 DIAGNOSIS — N183 Chronic kidney disease, stage 3 unspecified: Secondary | ICD-10-CM | POA: Diagnosis not present

## 2023-07-27 DIAGNOSIS — Z21 Asymptomatic human immunodeficiency virus [HIV] infection status: Secondary | ICD-10-CM | POA: Diagnosis not present

## 2023-07-27 DIAGNOSIS — Z79899 Other long term (current) drug therapy: Secondary | ICD-10-CM | POA: Insufficient documentation

## 2023-07-27 LAB — CMP (CANCER CENTER ONLY)
ALT: 11 U/L (ref 0–44)
AST: 13 U/L — ABNORMAL LOW (ref 15–41)
Albumin: 3.9 g/dL (ref 3.5–5.0)
Alkaline Phosphatase: 89 U/L (ref 38–126)
Anion gap: 4 — ABNORMAL LOW (ref 5–15)
BUN: 15 mg/dL (ref 6–20)
CO2: 22 mmol/L (ref 22–32)
Calcium: 9 mg/dL (ref 8.9–10.3)
Chloride: 111 mmol/L (ref 98–111)
Creatinine: 1.37 mg/dL — ABNORMAL HIGH (ref 0.61–1.24)
GFR, Estimated: >60 mL/min (ref >60)
Glucose, Bld: 87 mg/dL (ref 70–99)
Potassium: 3.7 mmol/L (ref 3.5–5.1)
Sodium: 137 mmol/L (ref 135–145)
Total Bilirubin: 0.5 mg/dL (ref 0.0–1.2)
Total Protein: 8.4 g/dL — ABNORMAL HIGH (ref 6.5–8.1)

## 2023-07-27 LAB — CBC WITH DIFFERENTIAL (CANCER CENTER ONLY)
Abs Immature Granulocytes: 0.01 10*3/uL (ref 0.00–0.07)
Basophils Absolute: 0 10*3/uL (ref 0.0–0.1)
Basophils Relative: 0 %
Eosinophils Absolute: 1.5 10*3/uL — ABNORMAL HIGH (ref 0.0–0.5)
Eosinophils Relative: 21 %
HCT: 43.2 % (ref 39.0–52.0)
Hemoglobin: 15.7 g/dL (ref 13.0–17.0)
Immature Granulocytes: 0 %
Lymphocytes Relative: 31 %
Lymphs Abs: 2.2 10*3/uL (ref 0.7–4.0)
MCH: 30.8 pg (ref 26.0–34.0)
MCHC: 36.3 g/dL — ABNORMAL HIGH (ref 30.0–36.0)
MCV: 84.9 fL (ref 80.0–100.0)
Monocytes Absolute: 0.4 10*3/uL (ref 0.1–1.0)
Monocytes Relative: 6 %
Neutro Abs: 3 10*3/uL (ref 1.7–7.7)
Neutrophils Relative %: 42 %
Platelet Count: 188 10*3/uL (ref 150–400)
RBC: 5.09 MIL/uL (ref 4.22–5.81)
RDW: 13.3 % (ref 11.5–15.5)
WBC Count: 7.2 10*3/uL (ref 4.0–10.5)
nRBC: 0 % (ref 0.0–0.2)

## 2023-07-27 LAB — LACTATE DEHYDROGENASE: LDH: 266 U/L — ABNORMAL HIGH (ref 98–192)

## 2023-07-27 MED ORDER — LIDOCAINE-PRILOCAINE 2.5-2.5 % EX CREA: 1.0000 | TOPICAL_CREAM | CUTANEOUS | 0 refills | Status: DC | PRN

## 2023-07-27 MED ORDER — SODIUM CHLORIDE 0.9% FLUSH
10.0000 mL | INTRAVENOUS | Status: DC | PRN
Start: 2023-07-27 — End: 2023-07-27
  Administered 2023-07-27: 10 mL

## 2023-07-27 MED ORDER — HEPARIN SOD (PORK) LOCK FLUSH 100 UNIT/ML IV SOLN
500.0000 [IU] | Freq: Once | INTRAVENOUS | Status: AC | PRN
  Administered 2023-07-27: 500 [IU]

## 2023-07-29 ENCOUNTER — Encounter: Payer: Self-pay | Admitting: Hematology and Oncology

## 2023-09-11 ENCOUNTER — Other Ambulatory Visit: Payer: Self-pay | Admitting: Infectious Diseases

## 2023-09-11 DIAGNOSIS — Z21 Asymptomatic human immunodeficiency virus [HIV] infection status: Secondary | ICD-10-CM

## 2023-10-01 ENCOUNTER — Telehealth: Payer: Self-pay | Admitting: Hematology and Oncology

## 2023-10-02 ENCOUNTER — Encounter: Payer: Self-pay | Admitting: *Deleted

## 2023-10-02 ENCOUNTER — Other Ambulatory Visit: Payer: Self-pay | Admitting: Infectious Diseases

## 2023-10-02 DIAGNOSIS — Z113 Encounter for screening for infections with a predominantly sexual mode of transmission: Secondary | ICD-10-CM

## 2023-10-02 DIAGNOSIS — B20 Human immunodeficiency virus [HIV] disease: Secondary | ICD-10-CM

## 2023-10-02 DIAGNOSIS — Z79899 Other long term (current) drug therapy: Secondary | ICD-10-CM

## 2023-10-15 ENCOUNTER — Ambulatory Visit (HOSPITAL_COMMUNITY)
Admission: RE | Admit: 2023-10-15 | Discharge: 2023-10-15 | Disposition: A | Discharge status: Home / Self Care | Payer: BC Managed Care – PPO | Source: Ambulatory Visit | Attending: Hematology and Oncology | Admitting: Hematology and Oncology

## 2023-10-15 DIAGNOSIS — C8118 Nodular sclerosis classical Hodgkin lymphoma, lymph nodes of multiple sites: Secondary | ICD-10-CM | POA: Insufficient documentation

## 2023-10-15 MED ORDER — IOHEXOL 300 MG/ML  SOLN
100.0000 mL | Freq: Once | INTRAMUSCULAR | Status: AC | PRN
  Administered 2023-10-15: 100 mL via INTRAVENOUS

## 2023-10-24 ENCOUNTER — Inpatient Hospital Stay (HOSPITAL_BASED_OUTPATIENT_CLINIC_OR_DEPARTMENT_OTHER): Admitting: Hematology and Oncology

## 2023-10-24 ENCOUNTER — Inpatient Hospital Stay: Attending: Hematology and Oncology

## 2023-10-24 ENCOUNTER — Other Ambulatory Visit: Payer: Self-pay | Admitting: Hematology and Oncology

## 2023-10-24 VITALS — BP 137/82 | HR 83 | Temp 97.7°F | Resp 15 | Wt 277.3 lb

## 2023-10-24 DIAGNOSIS — Z888 Allergy status to other drugs, medicaments and biological substances status: Secondary | ICD-10-CM | POA: Diagnosis not present

## 2023-10-24 DIAGNOSIS — Z452 Encounter for adjustment and management of vascular access device: Secondary | ICD-10-CM | POA: Insufficient documentation

## 2023-10-24 DIAGNOSIS — C8118 Nodular sclerosis classical Hodgkin lymphoma, lymph nodes of multiple sites: Secondary | ICD-10-CM | POA: Diagnosis not present

## 2023-10-24 DIAGNOSIS — Z79899 Other long term (current) drug therapy: Secondary | ICD-10-CM | POA: Insufficient documentation

## 2023-10-24 DIAGNOSIS — Z8249 Family history of ischemic heart disease and other diseases of the circulatory system: Secondary | ICD-10-CM | POA: Insufficient documentation

## 2023-10-24 DIAGNOSIS — N183 Chronic kidney disease, stage 3 unspecified: Secondary | ICD-10-CM | POA: Insufficient documentation

## 2023-10-24 DIAGNOSIS — K5900 Constipation, unspecified: Secondary | ICD-10-CM | POA: Insufficient documentation

## 2023-10-24 DIAGNOSIS — Z95828 Presence of other vascular implants and grafts: Secondary | ICD-10-CM

## 2023-10-24 DIAGNOSIS — C8174 Other classical Hodgkin lymphoma, lymph nodes of axilla and upper limb: Secondary | ICD-10-CM | POA: Insufficient documentation

## 2023-10-24 LAB — CBC WITH DIFFERENTIAL (CANCER CENTER ONLY)
Abs Immature Granulocytes: 0 10*3/uL (ref 0.00–0.07)
Basophils Absolute: 0 10*3/uL (ref 0.0–0.1)
Basophils Relative: 0 %
Eosinophils Absolute: 1.2 10*3/uL — ABNORMAL HIGH (ref 0.0–0.5)
Eosinophils Relative: 17 %
HCT: 43.5 % (ref 39.0–52.0)
Hemoglobin: 15.3 g/dL (ref 13.0–17.0)
Immature Granulocytes: 0 %
Lymphocytes Relative: 31 %
Lymphs Abs: 2.2 10*3/uL (ref 0.7–4.0)
MCH: 31.2 pg (ref 26.0–34.0)
MCHC: 35.2 g/dL (ref 30.0–36.0)
MCV: 88.6 fL (ref 80.0–100.0)
Monocytes Absolute: 0.5 10*3/uL (ref 0.1–1.0)
Monocytes Relative: 6 %
Neutro Abs: 3.3 10*3/uL (ref 1.7–7.7)
Neutrophils Relative %: 46 %
Platelet Count: 183 10*3/uL (ref 150–400)
RBC: 4.91 MIL/uL (ref 4.22–5.81)
RDW: 13.9 % (ref 11.5–15.5)
WBC Count: 7.3 10*3/uL (ref 4.0–10.5)
nRBC: 0 % (ref 0.0–0.2)

## 2023-10-24 LAB — CMP (CANCER CENTER ONLY)
ALT: 12 U/L (ref 0–44)
AST: 13 U/L — ABNORMAL LOW (ref 15–41)
Albumin: 4 g/dL (ref 3.5–5.0)
Alkaline Phosphatase: 90 U/L (ref 38–126)
Anion gap: 4 — ABNORMAL LOW (ref 5–15)
BUN: 15 mg/dL (ref 6–20)
CO2: 24 mmol/L (ref 22–32)
Calcium: 8.9 mg/dL (ref 8.9–10.3)
Chloride: 110 mmol/L (ref 98–111)
Creatinine: 1.43 mg/dL — ABNORMAL HIGH (ref 0.61–1.24)
GFR, Estimated: >60 mL/min (ref >60)
Glucose, Bld: 98 mg/dL (ref 70–99)
Potassium: 3.9 mmol/L (ref 3.5–5.1)
Sodium: 138 mmol/L (ref 135–145)
Total Bilirubin: 0.5 mg/dL (ref 0.0–1.2)
Total Protein: 8.6 g/dL — ABNORMAL HIGH (ref 6.5–8.1)

## 2023-10-24 LAB — LACTATE DEHYDROGENASE: LDH: 242 U/L — ABNORMAL HIGH (ref 98–192)

## 2023-10-24 MED ORDER — HEPARIN SOD (PORK) LOCK FLUSH 100 UNIT/ML IV SOLN
500.0000 [IU] | Freq: Once | INTRAVENOUS | Status: AC | PRN
  Administered 2023-10-24: 500 [IU]

## 2023-10-24 MED ORDER — SODIUM CHLORIDE 0.9% FLUSH
10.0000 mL | INTRAVENOUS | Status: DC | PRN
  Administered 2023-10-24: 10 mL

## 2023-10-24 NOTE — Progress Notes (Signed)
 [name] [phone]) [phone]   [phone]  PROGRESS NOTE  Patient Care Team: Shirline Frees, NP as PCP - General (Family Medicine) Sidney Ace, MD as Referring Physician (Allergy) Jaci Standard, MD as Consulting Physician (Hematology and Oncology)  Hematological/Oncological History # Hodgkin's Lymphoma, Stage III 06/08/2022: CT neck showed Bulky adenopathy in the deep right pectoral region, recommend chest CT with contrast. There was splenomegaly on abdominal CT from the summer, consider addition of abdominal CT is well. 07/01/2023: CT chest showed Bulky RIGHT axillary lymph node enlargement, with nodal mass measuring 7 cm in length. Findings suspicious for lymphoproliferative disease 09/13/2022: US guided biopsy of right axillary lymphadenopathy was consistent with classical Hodgkin's lymphoma  10/06/2022: establish care with Dr. Leonides Schanz  10/31/2022: Cycle 1 Day 1 of A-AVD chemotherapy.  11/14/2022: Cycle 1 Day 15 of A-AVD chemotherapy 11/29/2022: Cycle 2 Day 1 of A-AVD chemotherapy 12/13/2022: Cycle 2 Day 15 of A-AVD chemotherapy (holding brentuximab and Udenyca due to worsening skin lesions from hidradenitis suppurativa) 12/19/2022: CT PET Study showed near complete metabolic response with residual 18 mm short axis right axillary node (Deauville criteria 2). 12/27/2022: Cycle 3 Day 1 of AVD chemotherapy 01/10/2023: Cycle 3 Day 15 of AVD chemotherapy 01/23/2023:  Cycle 4 Day 1 of AVD chemotherapy 02/06/2023: Cycle 4 Day 15 of AVD chemotherapy 02/20/2023: Cycle 5 Day 1 of AVD chemotherapy 03/07/2023: Cycle 5 Day 15 of AVD chemotherapy 03/20/2023: Cycle 6 Day 1 of AVD chemotherapy 04/04/2023: Cycle 6 Day 15 of AVD chemotherapy 04/18/2023: post treatment PET CT scan shows complete response to therapy.   Interval History:  Lee [name] 43 y.o. male with medical history significant for stage III nodular sclerosing Hodgkin's lymphoma who presents for a follow up  visit. The patient's last visit was on 07/27/2023. In the interim since the last visit he has had no major changes in his health.  On exam today Lee [name] reports he has been doing pretty good overall interim since her last visit.  He has been struggling with allergies due to the recent release of pollen.  He notes that he continues to have some occasional bouts of dry mouth.  He reports he does his best to drink plenty of water and juice and milk but sometimes has to wake up at night to drink.  He is also getting up 4-5 times at night to use the bathroom.  He is working on getting a visit with urology but is having difficulty reaching her service.  He reports he is doing his best to try to lose weight with a goal weight of 230 pounds.  He is currently 2077 pounds.  He notes he is had no recent issues with fevers, chills, sweats, nausea, vomiting or diarrhea.  He reports his energy levels are holding up well.  He did start a multivitamin once a day men's energy.  He is also going to the gym and doing cardio.  He is interested in having his port removed.. Full 10 point ROS is otherwise negative.    MEDICAL HISTORY:  Past Medical History:  Diagnosis Date   CKD (chronic kidney disease) stage 3, GFR 30-59 ml/min (HCC) 02/20/2019   Eczema    Environmental allergies    Essential hypertension    HIV test positive (HCC)    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HCC) 07/24/2016   Shingles     SURGICAL HISTORY: Past Surgical History:  Procedure Laterality Date   CHOLECYSTECTOMY N/A 12/30/2021   Procedure: LAPAROSCOPIC  CHOLECYSTECTOMY WITH  CHOLANGIOGRAM;  Surgeon: Darnell Level, MD;  Location: [facility];  Service: General;  Laterality: N/A;   IR IMAGING GUIDED PORT INSERTION  10/18/2022   LAPAROSCOPIC APPENDECTOMY N/A 03/12/2020   Procedure: APPENDECTOMY LAPAROSCOPIC;  Surgeon: Romie Levee, MD;  Location: [facility];  Service: General;  Laterality: N/A;   TYMPANOSTOMY TUBE PLACEMENT     WISDOM TOOTH  EXTRACTION     WRIST SURGERY      SOCIAL HISTORY: Social History   Socioeconomic History   Marital status: Single    Spouse name: Not on file   Number of children: Not on file   Years of education: Not on file   Highest education level: Associate degree: academic program  Occupational History   Not on file  Tobacco Use   Smoking status: Never   Smokeless tobacco: Never  Vaping Use   Vaping status: Never Used  Substance and Sexual Activity   Alcohol use: No   Drug use: No   Sexual activity: Not Currently    Partners: Male    Birth control/protection: Condom    Comment: Given Condoms  Other Topics Concern   Not on file  Social History Narrative   Works as Acupuncturist    -Not married   Social Drivers of Corporate investment banker Strain: Low Risk  (06/11/2023)   Overall Financial Resource Strain (CARDIA)    Difficulty of Paying Living Expenses: Not hard at all  Food Insecurity: No Food Insecurity (06/11/2023)   Hunger Vital Sign    Worried About Running Out of Food in the Last Year: Never true    Ran Out of Food in the Last Year: Never true  Transportation Needs: No Transportation Needs (06/11/2023)   PRAPARE - Administrator, Civil Service (Medical): No    Lack of Transportation (Non-Medical): No  Physical Activity: Sufficiently Active (06/11/2023)   Exercise Vital Sign    Days of Exercise per Week: 5 days    Minutes of Exercise per Session: 50 min  Stress: Stress Concern Present (06/11/2023)   [employer] of Occupational Health - Occupational Stress Questionnaire    Feeling of Stress : Rather much  Social Connections: Moderately Integrated (06/11/2023)   Social Connection and Isolation Panel [NHANES]    Frequency of Communication with Friends and Family: More than three times a week    Frequency of Social Gatherings with Friends and Family: Twice a week    Attends Religious Services: More than 4 times per year    Active Member of [employer] or  Organizations: Yes    Attends Engineer, structural: More than 4 times per year    Marital Status: Never married  Catering manager Violence: Not on file    FAMILY HISTORY: Family History  Problem Relation Age of Onset   Hypercalcemia Mother    Hypertension Mother    Hypertension Father    Prostate cancer Father    Multiple myeloma Father     ALLERGIES:  is allergic to gabapentin, lisinopril, and nickel.  MEDICATIONS:  Current Outpatient Medications  Medication Sig Dispense Refill   acetaminophen (TYLENOL) 650 MG CR tablet Take 650 mg by mouth every 8 (eight) hours as needed for pain.     albuterol (PROVENTIL HFA;VENTOLIN HFA) 108 (90 Base) MCG/ACT inhaler Inhale 2 puffs into the lungs every 6 (six) hours as needed for wheezing or shortness of breath. 1 Inhaler 1   amLODipine (NORVASC) 10 MG tablet Take 1 tablet by mouth once  daily 90 tablet 3   azelastine (ASTELIN) 0.1 % nasal spray 1-2 puffs in each nostril twice daily     azelastine (OPTIVAR) 0.05 % ophthalmic solution Apply to eye.     [name] 50-200-25 MG TABS tablet TAKE 1 TABLET DAILY 30 tablet 5   BREO ELLIPTA 200-25 MCG/ACT AEPB Inhale 1 puff into the lungs daily. 60 each 0   EPINEPHrine 0.3 mg/0.3 mL IJ SOAJ injection Inject 0.3 mg into the muscle once.     lidocaine-prilocaine (EMLA) cream Apply 1 Application topically as needed. 30 g 0   loratadine (CLARITIN) 10 MG tablet Take 10 mg by mouth daily.     montelukast (SINGULAIR) 10 MG tablet Take 10 mg by mouth at bedtime.     mupirocin ointment (BACTROBAN) 2 % Apply 1 Application topically 2 (two) times daily. Apply topical to the affected area twice a day until clear 30 g 3   ondansetron (ZOFRAN) 8 MG tablet Take 1 tablet (8 mg total) by mouth every 8 (eight) hours as needed. 30 tablet 0   ondansetron (ZOFRAN-ODT) 8 MG disintegrating tablet Take 1 tablet (8 mg total) by mouth every 8 (eight) hours as needed for nausea or vomiting. 30 tablet 0   oxyCODONE  (OXYCONTIN) 10 mg 12 hr tablet Take 1 tablet (10 mg total) by mouth every 12 (twelve) hours. 14 tablet 0   oxyCODONE 10 MG TABS Take 1 tablet (10 mg total) by mouth every 4 (four) hours as needed for severe pain. 90 tablet 0   phentermine 30 MG capsule Take 30 mg by mouth daily.     prochlorperazine (COMPAZINE) 10 MG tablet Take 1 tablet (10 mg total) by mouth every 6 (six) hours as needed for nausea or vomiting. 30 tablet 0   sildenafil (VIAGRA) 50 MG tablet Take 1 tablet (50 mg total) by mouth daily as needed for erectile dysfunction. 20 tablet 0   topiramate (TOPAMAX) 50 MG tablet Take 50 mg by mouth daily.     Vitamin D, Ergocalciferol, (DRISDOL) 1.25 MG (50000 UNIT) CAPS capsule Take 50,000 Units by mouth once a week.     No current facility-administered medications for this visit.    REVIEW OF SYSTEMS:   Constitutional: ( - ) fevers, ( - )  chills , ( - ) night sweats Eyes: ( - ) blurriness of vision, ( - ) double vision, ( - ) watery eyes Ears, nose, mouth, throat, and face: ( - ) mucositis, ( - ) sore throat Respiratory: ( - ) cough, ( - ) dyspnea, ( - ) wheezes Cardiovascular: ( - ) palpitation, ( - ) chest discomfort, ( - ) lower extremity swelling Gastrointestinal:  ( - ) nausea, ( - ) heartburn, ( - ) change in bowel habits Skin: ( - ) abnormal skin rashes Lymphatics: ( - ) new lymphadenopathy, ( - ) easy bruising Neurological: ( - ) numbness, ( - ) tingling, ( - ) new weaknesses Behavioral/Psych: ( - ) mood change, ( - ) new changes  All other systems were reviewed with the patient and are negative.  PHYSICAL EXAMINATION: ECOG PERFORMANCE STATUS: 1 - Symptomatic but completely ambulatory  There were no vitals filed for this visit.    There were no vitals filed for this visit.     GENERAL: Well-appearing young African-American male, alert, no distress and comfortable SKIN: skin color, texture, turgor are normal, no rashes. EYES: conjunctiva are pink and non-injected,  sclera clear LUNGS: clear to auscultation and percussion  with normal breathing effort HEART: regular rate & rhythm and no murmurs and no lower extremity edema Musculoskeletal: no cyanosis of digits and no clubbing  PSYCH: alert & oriented x 3, fluent speech NEURO: no focal motor/sensory deficits  LABORATORY DATA:  I have reviewed the data as listed    Latest Ref Rng & Units 07/27/2023   10:08 AM 06/15/2023   10:03 AM 06/15/2023    8:19 AM  CBC  WBC 4.0 - 10.5 K/uL 7.2  7.9  7.5   Hemoglobin 13.0 - 17.0 g/dL 40.9  81.1  91.4   Hematocrit 39.0 - 52.0 % 43.2  41.9  44.4   Platelets 150 - 400 K/uL 188  227  238.0        Latest Ref Rng & Units 07/27/2023   10:08 AM 06/15/2023   10:03 AM 06/15/2023    8:19 AM  CMP  Glucose 70 - 99 mg/dL 87  91  86   BUN 6 - 20 mg/dL 15  14  14    Creatinine 0.61 - 1.24 mg/dL 7.82  9.56  2.13   Sodium 135 - 145 mmol/L 137  138  138   Potassium 3.5 - 5.1 mmol/L 3.7  3.9  3.9   Chloride 98 - 111 mmol/L 111  111  109   CO2 22 - 32 mmol/L 22  22  21    Calcium 8.9 - 10.3 mg/dL 9.0  9.3  9.5   Total Protein 6.5 - 8.1 g/dL 8.4  8.2  8.5   Total Bilirubin 0.0 - 1.2 mg/dL 0.5  0.5  0.5   Alkaline Phos 38 - 126 U/L 89  93  96   AST 15 - 41 U/L 13  13  16    ALT 0 - 44 U/L 11  10  12       RADIOGRAPHIC STUDIES: I have personally reviewed the radiological images as listed and agreed with the findings in the report: FDG avid lymph nodes on both sides of the diaphragm, most prominently in the cervical regions and axilla.  Consistent with at least stage III disease. CT CHEST ABDOMEN PELVIS W CONTRAST Result Date: 10/21/2023 CLINICAL DATA:  Hematologic malignancy, assess treatment response. * Tracking Code: BO *. EXAM: CT CHEST, ABDOMEN, AND PELVIS WITH CONTRAST TECHNIQUE: Multidetector CT imaging of the chest, abdomen and pelvis was performed following the standard protocol during bolus administration of intravenous contrast. RADIATION DOSE REDUCTION: This exam was  performed according to the departmental dose-optimization program which includes automated exposure control, adjustment of the mA and/or kV according to patient size and/or use of iterative reconstruction technique. CONTRAST:  OMNIPAQUE IOHEXOL 300 MG/ML  SOLN COMPARISON:  Multiple priors including most recent PET-CT April 19, 2023. FINDINGS: CT CHEST FINDINGS Cardiovascular: Right chest Port-A-Cath with tip near the SVC. Normal size heart. Mediastinum/Nodes: Similar size of the right axillary/subpectoral lymph nodes. Dominant lymph node measures 17 mm in short axis on image 13/2, unchanged. Prominent mediastinal lymph nodes are stable from prior examination for instance a pre-vascular lymph node measuring 6 mm in short axis on image 27/2 is unchanged. The esophagus is grossly unremarkable. No suspicious thyroid nodule. Lungs/Pleura: Improved bilateral nodular opacities, mucoid impaction and bronchial wall thickening. For reference: -previously index right upper lobe pulmonary nodule measures 2 mm on image 70/4 previously 7 mm. -previously indexed nodule in the left lung apex measures 4 mm on image 34/4 previously 9 mm. No new suspicious pulmonary nodules or masses. Musculoskeletal: No aggressive lytic or blastic  lesion of bone. CT ABDOMEN PELVIS FINDINGS Hepatobiliary: No suspicious hepatic lesion. Gallbladder surgically absent. No biliary ductal dilation. Pancreas: No pancreatic ductal dilation or evidence of acute inflammation. Spleen: No splenomegaly. Adrenals/Urinary Tract: No suspicious adrenal nodule/mass. Hypodense renal lesions are technically too small to accurately characterize but statistically likely reflect cysts and considered benign requiring no independent imaging follow-up in the absence of clinically indicated signs/symptoms. Stomach/Bowel: Stomach is unremarkable for degree of distension. No pathologic dilation of small or large bowel. Suture material along the cecum. No evidence of  acute bowel inflammation. Vascular/Lymphatic: Normal caliber abdominal aorta. Smooth IVC contours. No pathologically enlarged abdominal or pelvic lymph nodes. Reproductive: Prostate is unremarkable. Other: No significant abdominopelvic free fluid. Musculoskeletal: No aggressive lytic or blastic lesion of bone. L4-L5 discogenic disease. Degenerative changes bilateral hips. IMPRESSION: 1. Similar size of the right axillary/subpectoral lymph nodes. No evidence of new or progressive lymphadenopathy in the chest, abdomen or pelvis. 2. Improved bilateral nodular opacities, mucoid impaction and bronchial wall thickening, likely reflecting a resolving infectious/inflammatory process. Suggest continued radiographic follow-up to resolution. Electronically Signed   By: Maudry Mayhew M.D.   On: 10/21/2023 17:08    ASSESSMENT & PLAN Lee [name] is a 43 y.o. male with medical history significant for stage III nodular sclerosing Hodgkin's lymphoma who presents for a follow up visit.    # Hodgkin's Lymphoma, Stage III -- PET CT scan staging complete, findings consistent with stage III Hodgkin's lymphoma. --Will plan to proceed with A-AVD chemotherapy. --Echocardiogram complete, shows excellent baseline cardiac function. --Mid treatment PET CT scan on 12/19/2022 showed excellent response to treatment (Deauville 2).  --Due to worsening skin lesions that is likely flaring up from Udenyca injection, we held Brentuximab and Udenyca injection starting Cycle 2, Day 15.  Plan:  --Labs previously showed Hgb 15.3, MCV 88.6, platelets 183, white blood cell 7.3, LFTs normal.  -- Patient has completed 6 cycles of A-AVD chemotherapy.  --Post treatment PET scan shows no evidence of residual or recurrent disease. CT scan on 10/15/2023 showed no evidence of recurrence, next due in Sept 2025.  --Strict neutropenic precautions given including monitor for fevers --Proceed with treatment without any further dose modifications.   --RTC in 3 months time with repeat CT scan in 6 months   #Hidradenitis Suppurativa--resolved --Under the care of dermatology and relates flare ups to GCSF injection which has greatly improved since discontinuing GCSF.  --Continue with oral doxycycline and intralesional kenalog as needed  #Constipation-stable: --Likely secondary to opoid pain medication --Recommend to try OTC stool softeners such as senna or miralax.    #Supportive Care -- chemotherapy education complete -- port removal order placed.  -- zofran 8mg  q8H PRN and compazine 10mg  PO q6H for nausea -- EMLA cream for port.  -- no pain medication required at this time.   No orders of the defined types were placed in this encounter.  All questions were answered. The patient knows to call the clinic with any problems, questions or concerns.  I have spent a total of 25 minutes minutes of face-to-face and non-face-to-face time, preparing to see the patient,performing a medically appropriate examination, counseling and educating the patient, ordering medications/tests/procedures,documenting clinical information in the electronic health record, and care coordination.   Ulysees Barns, MD Department of Hematology/Oncology [facility] at [facility] Phone: [phone] Pager: [phone] Email: [email or web address]  10/24/2023 7:25 AM

## 2023-10-25 ENCOUNTER — Ambulatory Visit: Payer: BC Managed Care – PPO | Admitting: Hematology and Oncology

## 2023-10-25 ENCOUNTER — Other Ambulatory Visit: Payer: BC Managed Care – PPO

## 2023-10-30 ENCOUNTER — Other Ambulatory Visit

## 2023-10-30 ENCOUNTER — Other Ambulatory Visit (HOSPITAL_COMMUNITY)
Admission: RE | Admit: 2023-10-30 | Discharge: 2023-10-30 | Disposition: A | Discharge status: Home / Self Care | Source: Ambulatory Visit | Attending: Infectious Diseases | Admitting: Infectious Diseases

## 2023-10-30 ENCOUNTER — Other Ambulatory Visit: Payer: Self-pay | Admitting: *Deleted

## 2023-10-30 DIAGNOSIS — Z113 Encounter for screening for infections with a predominantly sexual mode of transmission: Secondary | ICD-10-CM

## 2023-10-30 DIAGNOSIS — Z79899 Other long term (current) drug therapy: Secondary | ICD-10-CM

## 2023-10-30 DIAGNOSIS — B20 Human immunodeficiency virus [HIV] disease: Secondary | ICD-10-CM

## 2023-10-31 ENCOUNTER — Encounter: Payer: Self-pay | Admitting: Hematology and Oncology

## 2023-10-31 LAB — URINE CYTOLOGY ANCILLARY ONLY
Chlamydia: NEGATIVE
Comment: NEGATIVE
Comment: NORMAL
Neisseria Gonorrhea: NEGATIVE

## 2023-10-31 LAB — T-HELPER CELL (CD4) - (RCID CLINIC ONLY)
CD4 % Helper T Cell: 37 % (ref 33–65)
CD4 T Cell Abs: 972 /uL (ref 400–1790)

## 2023-11-01 ENCOUNTER — Encounter: Payer: Self-pay | Admitting: Hematology and Oncology

## 2023-11-01 LAB — LIPID PANEL: Chol/HDL Ratio: 5.5 ratio — ABNORMAL HIGH (ref 0.0–5.0)

## 2023-11-02 ENCOUNTER — Encounter: Payer: Self-pay | Admitting: Hematology and Oncology

## 2023-11-02 LAB — LIPID PANEL
Cholesterol, Total: 198 mg/dL (ref 100–199)
HDL: 36 mg/dL — ABNORMAL LOW (ref >39)
LDL CALC COMMENT:: 5.5 ratio — ABNORMAL HIGH (ref 0.0–5.0)
LDL Chol Calc (NIH): 136 mg/dL — ABNORMAL HIGH (ref 0–99)
Triglycerides: 142 mg/dL (ref 0–149)
VLDL Cholesterol Cal: 26 mg/dL (ref 5–40)

## 2023-11-02 LAB — COMPREHENSIVE METABOLIC PANEL WITH GFR
ALT: 13 IU/L (ref 0–44)
AST: 15 IU/L (ref 0–40)
Albumin: 4.2 g/dL (ref 4.1–5.1)
Alkaline Phosphatase: 109 IU/L (ref 44–121)
BUN/Creatinine Ratio: 9 (ref 9–20)
BUN: 13 mg/dL (ref 6–24)
Bilirubin Total: 0.4 mg/dL (ref 0.0–1.2)
CO2: 17 mmol/L — ABNORMAL LOW (ref 20–29)
Calcium: 9.1 mg/dL (ref 8.7–10.2)
Chloride: 106 mmol/L (ref 96–106)
Creatinine, Ser: 1.44 mg/dL — ABNORMAL HIGH (ref 0.76–1.27)
Globulin, Total: 4.3 g/dL (ref 1.5–4.5)
Glucose: 78 mg/dL (ref 70–99)
Potassium: 4.2 mmol/L (ref 3.5–5.2)
Sodium: 137 mmol/L (ref 134–144)
Total Protein: 8.5 g/dL (ref 6.0–8.5)
eGFR: 63 mL/min/{1.73_m2} (ref >59)

## 2023-11-02 LAB — CBC
Hematocrit: 46.4 % (ref 37.5–51.0)
Hemoglobin: 15.9 g/dL (ref 13.0–17.7)
MCH: 31.6 pg (ref 26.6–33.0)
MCHC: 34.3 g/dL (ref 31.5–35.7)
MCV: 92 fL (ref 79–97)
Platelets: 228 10*3/uL (ref 150–450)
RBC: 5.03 x10E6/uL (ref 4.14–5.80)
RDW: 15.2 % (ref 11.6–15.4)
WBC: 8.4 10*3/uL (ref 3.4–10.8)

## 2023-11-02 LAB — RPR, QUANT+TP ABS (REFLEX): Rapid Plasma Reagin, Quant: 1:2 {titer} — ABNORMAL HIGH

## 2023-11-02 LAB — HIV-1 RNA QUANT-NO REFLEX-BLD
HIV-1 RNA Viral Load Log: 2.826 {Log_copies}/mL
HIV-1 RNA Viral Load: 670 {copies}/mL

## 2023-11-02 LAB — RPR: RPR Ser Ql: REACTIVE — AB

## 2023-11-07 ENCOUNTER — Ambulatory Visit (INDEPENDENT_AMBULATORY_CARE_PROVIDER_SITE_OTHER): Admitting: Infectious Diseases

## 2023-11-07 VITALS — BP 123/85 | HR 76 | Ht 76.0 in | Wt 282.2 lb

## 2023-11-07 DIAGNOSIS — J123 Human metapneumovirus pneumonia: Secondary | ICD-10-CM

## 2023-11-07 DIAGNOSIS — A539 Syphilis, unspecified: Secondary | ICD-10-CM | POA: Diagnosis not present

## 2023-11-07 DIAGNOSIS — T7840XD Allergy, unspecified, subsequent encounter: Secondary | ICD-10-CM

## 2023-11-07 DIAGNOSIS — Z95828 Presence of other vascular implants and grafts: Secondary | ICD-10-CM

## 2023-11-07 DIAGNOSIS — Z21 Asymptomatic human immunodeficiency virus [HIV] infection status: Secondary | ICD-10-CM

## 2023-11-07 DIAGNOSIS — C8190 Hodgkin lymphoma, unspecified, unspecified site: Secondary | ICD-10-CM

## 2023-11-07 DIAGNOSIS — N529 Male erectile dysfunction, unspecified: Secondary | ICD-10-CM

## 2023-11-07 DIAGNOSIS — Z113 Encounter for screening for infections with a predominantly sexual mode of transmission: Secondary | ICD-10-CM

## 2023-11-07 DIAGNOSIS — Z79899 Other long term (current) drug therapy: Secondary | ICD-10-CM

## 2023-11-07 DIAGNOSIS — C8118 Nodular sclerosis classical Hodgkin lymphoma, lymph nodes of multiple sites: Secondary | ICD-10-CM

## 2023-11-07 MED ORDER — BREO ELLIPTA 200-25 MCG/ACT IN AEPB: 1.0000 | INHALATION_SPRAY | Freq: Every day | RESPIRATORY_TRACT | 0 refills | Status: DC

## 2023-11-07 MED ORDER — ALBUTEROL SULFATE HFA 108 (90 BASE) MCG/ACT IN AERS: 2.0000 | INHALATION_SPRAY | Freq: Four times a day (QID) | RESPIRATORY_TRACT | 1 refills | Status: DC | PRN

## 2023-11-07 MED ORDER — SILDENAFIL CITRATE 100 MG PO TABS: 100.0000 mg | ORAL_TABLET | Freq: Every day | ORAL | 1 refills | Status: DC | PRN

## 2023-11-07 NOTE — Assessment & Plan Note (Signed)
 Has not had effect from viagra/, will increase dose.  Given precautions, about prolonged erections, changes in bp, chest pain.

## 2023-11-07 NOTE — Assessment & Plan Note (Signed)
 He'll continue to f/u with his pcp, allergy His inhalers are refilled.

## 2023-11-07 NOTE — Progress Notes (Signed)
 Subjective:    Patient ID: Lee [name], male  DOB: 1981/06/12, 43 y.o.        MRN: [id number]   HPI 43 yo M with hx of HTN and HIV+ dx 08-03-16.  He had first visit 09-07-16 and was also found to have syphilis. He received IM PEN G on 09-14-16.  His genotype was naive, HLA negative.  Has been on triumeq but at 12-21-16 he had increased LFTs and Cr on labs. He had repeat labs done on 6-8 which confirmed this. He was seen in f/u with pharm 02-2017 and was changed to biktarvy.  He had emergency cholecystectomy 12-2021. 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  He completed his CTX 03-2023 with a PET at that time that showed no residual disease.   Wt up 70# since starting chemo.  Having ankle, knee pain since.  Getting port out next week.   [phone]) PET: 1. No significant metabolic activity remains within the RIGHT axillary lymph node. Deauville 1 2. No new metabolic adenopathy on skull base to thigh FDG PET scan. 3. New bilateral scattered pulmonary nodules and peribronchial thickening with very mild metabolic activity. Primary differential would include lymphoma recurrence versus drug reaction. Recommend clinical correlation.  [phone]) CT: 1. Similar size of the right axillary/subpectoral lymph nodes. No evidence of new or progressive lymphadenopathy in the chest, abdomen or pelvis. 2. Improved bilateral nodular opacities, mucoid impaction and bronchial wall thickening, likely reflecting a resolving infectious/inflammatory process. Suggest continued radiographic follow-up to resolution.  Has been coughing more, using inhalers more. Not clear if from pollen.   HIV 1 RNA Quant  Date Value  02/24/2022 Not Detected Copies/mL  05/26/2021 38 Copies/mL (H)  12/05/2019 35 copies/mL (H)   HIV-1 RNA Viral Load (copies/mL)  Date Value  10/30/2023 670   CD4 T Cell Abs (/uL)  Date Value  10/30/2023 972   05/26/2021 525  12/05/2019 538     Health Maintenance  Topic Date Due   COVID-19 Vaccine (5 - 2024-25 season) 03/25/2023   INFLUENZA VACCINE  02/22/2024   DTaP/Tdap/Td (8 - Td or Tdap) 02/19/2029   Pneumococcal Vaccine 86-34 Years old (4 of 4 - PCV20 or PCV21) 02/11/2033   Hepatitis C Screening  Completed   HIV Screening  Completed   HPV VACCINES  Aged Out   Meningococcal B Vaccine  Aged Out      Review of Systems  Constitutional:  Negative for chills, fever and weight loss.  Respiratory:  Positive for cough (every AM, with exertion.), sputum production and shortness of breath.   Cardiovascular:  Negative for chest pain (tightness.).  Gastrointestinal:  Negative for constipation and diarrhea.  Genitourinary:  Negative for dysuria.    Please see HPI. All other systems reviewed and negative.     Objective:  Physical Exam Vitals reviewed.  Constitutional:      Appearance: Normal appearance. He is obese.  HENT:     Mouth/Throat:     Mouth: Mucous membranes are moist.     Pharynx: No oropharyngeal exudate.  Eyes:     Extraocular Movements: Extraocular movements intact.     Pupils: Pupils are equal, round, and reactive to light.  Cardiovascular:     Rate and Rhythm: Normal rate and regular rhythm.  Pulmonary:     Effort: Pulmonary effort is normal.     Breath sounds: Wheezing present.  Abdominal:     General: Bowel sounds  are normal. There is no distension.     Palpations: Abdomen is soft.     Tenderness: There is no abdominal tenderness.  Musculoskeletal:     Cervical back: Normal range of motion and neck supple.  Neurological:     Mental Status: He is alert.  Psychiatric:        Mood and Affect: Mood normal.            Assessment & Plan:

## 2023-11-07 NOTE — Assessment & Plan Note (Signed)
 My great appreciation to Dr Rosaline Coma and his f/u.

## 2023-11-07 NOTE — Assessment & Plan Note (Signed)
 Being removed next week, celebrated.

## 2023-11-07 NOTE — Assessment & Plan Note (Signed)
 He is doing well Offered/refused condoms.  Will repeat his HIV RNA, genotype.  Call him with results.  Continue biktarvy for now.

## 2023-11-09 LAB — HIV-1 RNA ULTRAQUANT REFLEX TO GENTYP+
HIV1 RNA # SerPl PCR: 80 {copies}/mL
HIV1 RNA Plas PCR-Log#: 1.903 {Log_copies}/mL

## 2023-11-13 ENCOUNTER — Encounter: Admitting: Infectious Diseases

## 2023-11-16 ENCOUNTER — Ambulatory Visit (HOSPITAL_COMMUNITY)
Admission: RE | Admit: 2023-11-16 | Discharge: 2023-11-16 | Disposition: A | Discharge status: Home / Self Care | Source: Ambulatory Visit | Attending: Hematology and Oncology | Admitting: Hematology and Oncology

## 2023-11-16 DIAGNOSIS — C8118 Nodular sclerosis classical Hodgkin lymphoma, lymph nodes of multiple sites: Secondary | ICD-10-CM | POA: Diagnosis present

## 2023-11-16 HISTORY — PX: IR REMOVAL TUN ACCESS W/ PORT W/O FL MOD SED: IMG2290

## 2023-11-16 MED ORDER — LIDOCAINE-EPINEPHRINE 1 %-1:100000 IJ SOLN
20.0000 mL | Freq: Once | INTRAMUSCULAR | Status: AC
  Administered 2023-11-16: 10 mL via INTRADERMAL

## 2023-11-16 MED ORDER — LIDOCAINE-EPINEPHRINE 1 %-1:100000 IJ SOLN
INTRAMUSCULAR | Status: AC
  Filled 2023-11-16: qty 1

## 2023-11-30 ENCOUNTER — Other Ambulatory Visit (HOSPITAL_BASED_OUTPATIENT_CLINIC_OR_DEPARTMENT_OTHER): Payer: Self-pay

## 2023-11-30 ENCOUNTER — Other Ambulatory Visit: Payer: Self-pay

## 2023-11-30 ENCOUNTER — Encounter (HOSPITAL_BASED_OUTPATIENT_CLINIC_OR_DEPARTMENT_OTHER): Payer: Self-pay

## 2023-11-30 ENCOUNTER — Emergency Department (HOSPITAL_BASED_OUTPATIENT_CLINIC_OR_DEPARTMENT_OTHER): Admitting: Radiology

## 2023-11-30 ENCOUNTER — Emergency Department (HOSPITAL_BASED_OUTPATIENT_CLINIC_OR_DEPARTMENT_OTHER)
Admission: EM | Admit: 2023-11-30 | Discharge: 2023-11-30 | Disposition: A | Discharge status: Home / Self Care | Attending: Emergency Medicine | Admitting: Emergency Medicine

## 2023-11-30 DIAGNOSIS — Z21 Asymptomatic human immunodeficiency virus [HIV] infection status: Secondary | ICD-10-CM | POA: Insufficient documentation

## 2023-11-30 DIAGNOSIS — M109 Gout, unspecified: Secondary | ICD-10-CM

## 2023-11-30 DIAGNOSIS — I129 Hypertensive chronic kidney disease with stage 1 through stage 4 chronic kidney disease, or unspecified chronic kidney disease: Secondary | ICD-10-CM | POA: Diagnosis not present

## 2023-11-30 DIAGNOSIS — Z79899 Other long term (current) drug therapy: Secondary | ICD-10-CM | POA: Diagnosis not present

## 2023-11-30 DIAGNOSIS — M79641 Pain in right hand: Secondary | ICD-10-CM

## 2023-11-30 DIAGNOSIS — N183 Chronic kidney disease, stage 3 unspecified: Secondary | ICD-10-CM | POA: Insufficient documentation

## 2023-11-30 HISTORY — DX: Hodgkin lymphoma, unspecified, unspecified site: C81.90

## 2023-11-30 MED ORDER — METHYLPREDNISOLONE 4 MG PO TBPK
ORAL_TABLET | ORAL | 0 refills | Status: DC
  Filled 2023-11-30: qty 21, 6d supply, fill #0

## 2023-11-30 MED ORDER — KETOROLAC TROMETHAMINE 60 MG/2ML IM SOLN
30.0000 mg | Freq: Once | INTRAMUSCULAR | Status: AC
  Administered 2023-11-30: 30 mg via INTRAMUSCULAR
  Filled 2023-11-30: qty 2

## 2023-11-30 MED ORDER — FAMOTIDINE 40 MG PO TABS
40.0000 mg | ORAL_TABLET | Freq: Every day | ORAL | 0 refills | Status: DC
  Filled 2023-11-30: qty 14, 14d supply, fill #0

## 2023-11-30 NOTE — ED Notes (Signed)
 RN reviewed discharge instructions with pt. Pt verbalized understanding and had no further questions. VSS upon discharge.

## 2023-11-30 NOTE — Discharge Instructions (Addendum)
 Like we discussed, the pain in your hand may be being caused by gout.  Some of these medications that we have tried may resolve the issue and that would be helpful for given your diagnosis.  I have mentioned prescribing you Toradol , however one of the medications that you are on [name] , does not recommend being on NSAIDs, so we are sending you with a short course of steroids.  Please take these instructed by the dose packaging.  Return to the emergency room if you develop any increased swelling in your hand or arm.  Follow-up with your primary care doctor within 1 to 2 weeks.

## 2023-11-30 NOTE — ED Provider Notes (Signed)
  Physical Exam  BP (!) 131/90 (BP Location: Right Arm)   Pulse 86   Temp 98.2 F (36.8 C)   Resp 18   Ht 6\' 4"  (1.93 m)   Wt 124.7 kg   SpO2 95%   BMI 33.47 kg/m   Physical Exam  Procedures  Procedures  ED Course / MDM    Medical Decision Making Amount and/or Complexity of Data Reviewed Radiology: ordered.   Lee [name], assumed care for this patient.  In brief this is a 43 year old male who presented to the emergency room today for hand pain.  Patient had a port removed from the same arm after fishing chemotherapy 1 week ago.  On exam, patient has a clear area of focal pain at the fifth carpal metacarpal joint.  He has no overlying bruising, no obvious injury.  On exam there is a small lucency in the area, x-ray same possibly foreign body.  Patient denies any knowledge of any prior injury to this hand which could have resulted in a foreign body, and I do not appreciate any such scarring or injury on my exam today.  There is no significant swelling of the hand or upper arm.  I agree with the earlier plan of treating this as a potential gout flare with close follow-up.  Will provide the patient with some Toradol , send a prescription.  He will follow-up with his PCP or return to the emergency department if symptoms worsen.       Lee Horse T, DO 11/30/23 1528

## 2023-11-30 NOTE — ED Provider Notes (Signed)
 [place] [facility]PARTMENT AT [facility] Provider Note   CSN: [id number] Arrival date & time: 11/30/23  1227     History  Chief Complaint  Patient presents with   Hand Pain    Lee [name] is a 43 y.o. male.   Hand Pain  Patient presents with right hand pain.  Over the ulnar aspect of the hand.  Pain with moving of the little finger.  No fevers.  No injury.  Did have recent Chemo-Port removed from that side. No fevers or chills.  No trauma.   Past Medical History:  Diagnosis Date   CKD (chronic kidney disease) stage 3, GFR 30-59 ml/min (HCC) 02/20/2019   Eczema    Environmental allergies    Essential hypertension    HIV test positive (HCC)    Hodgkin's lymphoma (HCC)    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HCC) 07/24/2016   Shingles     Home Medications Prior to Admission medications   Medication Sig Start Date End Date Taking? Authorizing Provider  acetaminophen  (TYLENOL ) 650 MG CR tablet Take 650 mg by mouth every 8 (eight) hours as needed for pain.    [provider]  albuterol  (VENTOLIN  HFA) 108 (90 Base) MCG/ACT inhaler Inhale 2 puffs into the lungs every 6 (six) hours as needed for wheezing or shortness of breath. 11/07/23   Lee Cross, MD  amLODipine  (NORVASC ) 10 MG tablet Take 1 tablet by mouth once daily 01/02/23   Lee Cross, MD  azelastine  (ASTELIN ) 0.1 % nasal spray 1-2 puffs in each nostril twice daily    Lee Heymann, MD  azelastine  (OPTIVAR ) 0.05 % ophthalmic solution Apply to eye. 01/17/22   [provider]  [name]  50-200-25 MG TABS tablet TAKE 1 TABLET DAILY 09/11/23   Lee Cross, MD  BREO ELLIPTA  200-25 MCG/ACT AEPB Inhale 1 puff into the lungs daily. 11/07/23   Lee Cross, MD  EPINEPHrine  0.3 mg/0.3 mL IJ SOAJ injection Inject 0.3 mg into the muscle once.    Lee Heymann, MD  lidocaine -prilocaine  (EMLA ) cream Apply 1 Application topically as needed. 07/27/23   Lee Bame,  MD  loratadine  (CLARITIN ) 10 MG tablet Take 10 mg by mouth daily.    [provider]  montelukast  (SINGULAIR ) 10 MG tablet Take 10 mg by mouth at bedtime. 12/01/21   [provider]  mupirocin  ointment (BACTROBAN ) 2 % Apply 1 Application topically 2 (two) times daily. Apply topical to the affected area twice a day until clear 12/20/22   Lee Fenton, DO  ondansetron  (ZOFRAN ) 8 MG tablet Take 1 tablet (8 mg total) by mouth every 8 (eight) hours as needed. 10/08/22   Lee Bame, MD  ondansetron  (ZOFRAN -ODT) 8 MG disintegrating tablet Take 1 tablet (8 mg total) by mouth every 8 (eight) hours as needed for nausea or vomiting. 10/23/22   Lee Bame, MD  oxyCODONE  (OXYCONTIN ) 10 mg 12 hr tablet Take 1 tablet (10 mg total) by mouth every 12 (twelve) hours. 12/11/22   Lee [name]  oxyCODONE  10 MG TABS Take 1 tablet (10 mg total) by mouth every 4 (four) hours as needed for severe pain. 11/29/22   Lee [name]  phentermine  30 MG capsule Take 30 mg by mouth daily. 06/13/23   [provider]  prochlorperazine  (COMPAZINE ) 10 MG tablet Take 1 tablet (10 mg total) by mouth every 6 (six) hours as needed for nausea or vomiting. 10/08/22  Lee Bame, MD  sildenafil  (VIAGRA ) 100 MG tablet Take 1 tablet (100 mg total) by mouth daily as needed for erectile dysfunction. 11/07/23   Lee Cross, MD  topiramate  (TOPAMAX ) 50 MG tablet Take 50 mg by mouth daily. 06/13/23   [provider]  Vitamin D, Ergocalciferol, (DRISDOL) 1.25 MG (50000 UNIT) CAPS capsule Take 50,000 Units by mouth once a week. 06/06/23   [provider]      Allergies    Gabapentin , Lisinopril , and Nickel    Review of Systems   Review of Systems  Physical Exam Updated Vital Signs BP (!) 131/90 (BP Location: Right Arm)   Pulse 86   Temp 98.2 F (36.8 C)   Resp 18   Ht 6\' 4"  (1.93 m)   Wt 124.7 kg   SpO2 95%   BMI 33.47 kg/m  Physical Exam Vitals and  nursing note reviewed.  Musculoskeletal:     Comments: Tenderness over right fifth MCP joint.  Some pain with movement.  Good movement at the DIP and PIP joints.  Good capillary refill on finger.  No skin changes.  Mild swelling in the area.  No other swelling of the right upper extremity.  Neurological:     Mental Status: He is alert. Mental status is at baseline.     ED Results / Procedures / Treatments   Labs (all labs ordered are listed, but only abnormal results are displayed) Labs Reviewed - No data to display  EKG None  Radiology No results found.  Procedures Procedures    Medications Ordered in ED Medications - No data to display  ED Course/ Medical Decision Making/ A&P                                 Medical Decision Making Amount and/or Complexity of Data Reviewed Radiology: ordered.   Patient with right hand pain.  Critically over MCP joint.  Differential diagnosis does include fractures, gout, joint pain.  Infection considered particularly with previous port.  However felt less likely.  Will get x-ray to evaluate.  Pending results of x-ray.  Reviewed x-ray does show some mild calcification in the joint of the fifth MCP.  Official read pending.  Care turned over to Dr. Florie [name].        Final Clinical Impression(s) / ED Diagnoses Final diagnoses:  None    Rx / DC Orders ED Discharge Orders     None         Lee Arias, MD 11/30/23 1443

## 2023-11-30 NOTE — ED Triage Notes (Signed)
 In for eval of right hand pain and swelling. Completed chemo and port removed last Friday. Took tylenol  last pm. No injury.

## 2023-12-01 ENCOUNTER — Other Ambulatory Visit: Payer: Self-pay | Admitting: Infectious Diseases

## 2023-12-01 DIAGNOSIS — T7840XD Allergy, unspecified, subsequent encounter: Secondary | ICD-10-CM

## 2023-12-03 NOTE — Telephone Encounter (Signed)
 Medication sent to pharmacy

## 2023-12-11 ENCOUNTER — Telehealth: Payer: Self-pay | Admitting: Infectious Diseases

## 2023-12-11 NOTE — Telephone Encounter (Signed)
 Copied from CRM [id number]. Topic: Clinical - Lab/Test Results >> Dec 11, 2023 11:20 AM Jamas Maywood wrote: Reason for CRM: Patient called and was returning a call per Dr. Alwin Baars about his labs, patients callback number is [phone].

## 2023-12-11 NOTE — Telephone Encounter (Signed)
 Called pt and left vm for him to call back to clinic regarding his blood test (HIV RNA) Genotype was not able to be done.  Will repeat at his f/u, which he needs in 6 months

## 2023-12-12 ENCOUNTER — Other Ambulatory Visit: Payer: Self-pay | Admitting: Infectious Diseases

## 2023-12-12 DIAGNOSIS — I10 Essential (primary) hypertension: Secondary | ICD-10-CM

## 2023-12-12 NOTE — Telephone Encounter (Signed)
 Medication sent to pharmacy

## 2024-01-02 ENCOUNTER — Other Ambulatory Visit: Payer: Self-pay | Admitting: Infectious Diseases

## 2024-01-02 DIAGNOSIS — T7840XD Allergy, unspecified, subsequent encounter: Secondary | ICD-10-CM

## 2024-01-02 NOTE — Telephone Encounter (Signed)
 Medication sent to pharmacy

## 2024-01-17 ENCOUNTER — Telehealth: Payer: Self-pay | Admitting: Hematology and Oncology

## 2024-01-17 ENCOUNTER — Telehealth: Payer: Self-pay | Admitting: Hematology

## 2024-01-17 NOTE — Telephone Encounter (Signed)
 Error

## 2024-01-17 NOTE — Telephone Encounter (Signed)
 The patient called to reschedule his port flush's to regular labs. I left him a voicemail with appointment details.

## 2024-01-23 ENCOUNTER — Inpatient Hospital Stay

## 2024-01-23 ENCOUNTER — Inpatient Hospital Stay: Attending: Hematology and Oncology | Admitting: Hematology and Oncology

## 2024-01-23 ENCOUNTER — Other Ambulatory Visit: Payer: Self-pay | Admitting: Hematology and Oncology

## 2024-01-23 VITALS — BP 116/87 | HR 88 | Temp 98.7°F | Resp 14 | Wt 274.9 lb

## 2024-01-23 DIAGNOSIS — C8118 Nodular sclerosis classical Hodgkin lymphoma, lymph nodes of multiple sites: Secondary | ICD-10-CM

## 2024-01-23 DIAGNOSIS — Z79899 Other long term (current) drug therapy: Secondary | ICD-10-CM | POA: Diagnosis not present

## 2024-01-23 DIAGNOSIS — Z5111 Encounter for antineoplastic chemotherapy: Secondary | ICD-10-CM

## 2024-01-23 DIAGNOSIS — K5900 Constipation, unspecified: Secondary | ICD-10-CM | POA: Insufficient documentation

## 2024-01-23 DIAGNOSIS — R682 Dry mouth, unspecified: Secondary | ICD-10-CM | POA: Diagnosis not present

## 2024-01-23 DIAGNOSIS — Z888 Allergy status to other drugs, medicaments and biological substances status: Secondary | ICD-10-CM | POA: Diagnosis not present

## 2024-01-23 DIAGNOSIS — Z8249 Family history of ischemic heart disease and other diseases of the circulatory system: Secondary | ICD-10-CM | POA: Diagnosis not present

## 2024-01-23 DIAGNOSIS — N183 Chronic kidney disease, stage 3 unspecified: Secondary | ICD-10-CM | POA: Diagnosis not present

## 2024-01-23 DIAGNOSIS — C8174 Other classical Hodgkin lymphoma, lymph nodes of axilla and upper limb: Secondary | ICD-10-CM | POA: Diagnosis present

## 2024-01-23 DIAGNOSIS — Z95828 Presence of other vascular implants and grafts: Secondary | ICD-10-CM | POA: Diagnosis not present

## 2024-01-23 LAB — CBC WITH DIFFERENTIAL (CANCER CENTER ONLY)
Abs Immature Granulocytes: 0.01 10*3/uL (ref 0.00–0.07)
Basophils Absolute: 0.1 10*3/uL (ref 0.0–0.1)
Basophils Relative: 1 %
Eosinophils Absolute: 0.9 10*3/uL — ABNORMAL HIGH (ref 0.0–0.5)
Eosinophils Relative: 13 %
HCT: 46.7 % (ref 39.0–52.0)
Hemoglobin: 17 g/dL (ref 13.0–17.0)
Immature Granulocytes: 0 %
Lymphocytes Relative: 34 %
Lymphs Abs: 2.4 10*3/uL (ref 0.7–4.0)
MCH: 31.7 pg (ref 26.0–34.0)
MCHC: 36.4 g/dL — ABNORMAL HIGH (ref 30.0–36.0)
MCV: 87 fL (ref 80.0–100.0)
Monocytes Absolute: 0.4 10*3/uL (ref 0.1–1.0)
Monocytes Relative: 6 %
Neutro Abs: 3.4 10*3/uL (ref 1.7–7.7)
Neutrophils Relative %: 46 %
Platelet Count: 202 10*3/uL (ref 150–400)
RBC: 5.37 MIL/uL (ref 4.22–5.81)
RDW: 12 % (ref 11.5–15.5)
WBC Count: 7.1 10*3/uL (ref 4.0–10.5)
nRBC: 0 % (ref 0.0–0.2)

## 2024-01-23 LAB — CMP (CANCER CENTER ONLY)
ALT: 9 U/L (ref 0–44)
AST: 14 U/L — ABNORMAL LOW (ref 15–41)
Albumin: 4 g/dL (ref 3.5–5.0)
Alkaline Phosphatase: 94 U/L (ref 38–126)
Anion gap: 6 (ref 5–15)
BUN: 19 mg/dL (ref 6–20)
CO2: 21 mmol/L — ABNORMAL LOW (ref 22–32)
Calcium: 9 mg/dL (ref 8.9–10.3)
Chloride: 108 mmol/L (ref 98–111)
Creatinine: 1.68 mg/dL — ABNORMAL HIGH (ref 0.61–1.24)
GFR, Estimated: 52 mL/min — ABNORMAL LOW (ref >60)
Glucose, Bld: 76 mg/dL (ref 70–99)
Potassium: 3.9 mmol/L (ref 3.5–5.1)
Sodium: 135 mmol/L (ref 135–145)
Total Bilirubin: 0.7 mg/dL (ref 0.0–1.2)
Total Protein: 8.7 g/dL — ABNORMAL HIGH (ref 6.5–8.1)

## 2024-01-23 LAB — LACTATE DEHYDROGENASE: LDH: 235 U/L — ABNORMAL HIGH (ref 98–192)

## 2024-01-23 NOTE — Progress Notes (Unsigned)
 [facility] [phone]   [phone]  PROGRESS NOTE  Patient Care Team: Merna Huxley, NP as PCP - General (Family Medicine) Frutoso Luz, Lee [name] as Referring Physician (Allergy) Federico Norleen Lee MADISON, Lee [name] as Consulting Physician (Hematology and Oncology)  Hematological/Oncological History # Hodgkin's Lymphoma, Stage III 06/08/2022: CT neck showed Bulky adenopathy in the deep right pectoral region, recommend chest CT with contrast. There was splenomegaly on abdominal CT from the summer, consider addition of abdominal CT is well. 07/01/2023: CT chest showed Bulky RIGHT axillary lymph node enlargement, with nodal mass measuring 7 cm in length. Findings suspicious for lymphoproliferative disease 09/13/2022: US  guided biopsy of right axillary lymphadenopathy was consistent with classical Hodgkin's lymphoma  10/06/2022: establish care with Dr. Federico  10/31/2022: Cycle 1 Day 1 of A-AVD chemotherapy.  11/14/2022: Cycle 1 Day 15 of A-AVD chemotherapy 11/29/2022: Cycle 2 Day 1 of A-AVD chemotherapy 12/13/2022: Cycle 2 Day 15 of A-AVD chemotherapy (holding brentuximab and Udenyca  due to worsening skin lesions from hidradenitis suppurativa) 12/19/2022: CT PET Study showed near complete metabolic response with residual 18 mm short axis right axillary node (Deauville criteria 2). 12/27/2022: Cycle 3 Day 1 of AVD chemotherapy 01/10/2023: Cycle 3 Day 15 of AVD chemotherapy 01/23/2023:  Cycle 4 Day 1 of AVD chemotherapy 02/06/2023: Cycle 4 Day 15 of AVD chemotherapy 02/20/2023: Cycle 5 Day 1 of AVD chemotherapy 03/07/2023: Cycle 5 Day 15 of AVD chemotherapy 03/20/2023: Cycle 6 Day 1 of AVD chemotherapy 04/04/2023: Cycle 6 Day 15 of AVD chemotherapy 04/18/2023: post treatment PET CT scan shows complete response to therapy.   Interval History:  Lee [name] 43 y.o. male with medical history significant for stage III nodular sclerosing Hodgkin's lymphoma who presents for a follow up  visit. The patient's last visit was on 10/24/2023. In the interim since the last visit he has had no major changes in his health.  On exam today Lee [name] reports he is doing his best to try to stay in the heat and stay hydrated.  He reports overall he feels quite well.  He notes he is not having any new or concerning symptoms.  He denies any bumps or lumps concerning for lymphadenopathy.  He notes that he is trying to do more cardio in the gym and is taking a weight loss medication which is has him down to 274 pounds.  He reports that he has not had any further issues with his skin since we stopped the G-CSF medication.  He reports that he does have some occasional dry mouth but keeps a yeti mug full of water around him at all times.  Overall he feels quite well and has no additional questions concerns or complaints.  A full 10 point ROS is otherwise negative.  MEDICAL HISTORY:  Past Medical History:  Diagnosis Date   CKD (chronic kidney disease) stage 3, GFR 30-59 ml/min (HCC) 02/20/2019   Eczema    Environmental allergies    Essential hypertension    HIV test positive (HCC)    Hodgkin's lymphoma (HCC)    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HCC) 07/24/2016   Shingles     SURGICAL HISTORY: Past Surgical History:  Procedure Laterality Date   CHOLECYSTECTOMY N/A 12/30/2021   Procedure: LAPAROSCOPIC CHOLECYSTECTOMY WITH  CHOLANGIOGRAM;  Surgeon: Eletha Boas, Lee [name];  Location: [facility];  Service: General;  Laterality: N/A;   IR IMAGING GUIDED PORT INSERTION  10/18/2022   IR REMOVAL TUN ACCESS W/ PORT W/O FL MOD SED  11/16/2023  LAPAROSCOPIC APPENDECTOMY N/A 03/12/2020   Procedure: APPENDECTOMY LAPAROSCOPIC;  Surgeon: Debby Hila, Lee [name];  Location: [facility];  Service: General;  Laterality: N/A;   TYMPANOSTOMY TUBE PLACEMENT     WISDOM TOOTH EXTRACTION     WRIST SURGERY      SOCIAL HISTORY: Social History   Socioeconomic History   Marital status: Single    Spouse name: Not on file   Number  of children: Not on file   Years of education: Not on file   Highest education level: Associate degree: academic program  Occupational History   Not on file  Tobacco Use   Smoking status: Never   Smokeless tobacco: Never  Vaping Use   Vaping status: Never Used  Substance and Sexual Activity   Alcohol  use: No   Drug use: No   Sexual activity: Not Currently    Partners: Male    Birth control/protection: Condom    Comment: Given Condoms  Other Topics Concern   Not on file  Social History Narrative   Works as Acupuncturist    -Not married   Social Drivers of Corporate investment banker Strain: Low Risk  (06/11/2023)   Overall Financial Resource Strain (CARDIA)    Difficulty of Paying Living Expenses: Not hard at all  Food Insecurity: No Food Insecurity (11/07/2023)   Hunger Vital Sign    Worried About Running Out of Food in the Last Year: Never true    Ran Out of Food in the Last Year: Never true  Transportation Needs: No Transportation Needs (11/07/2023)   PRAPARE - Administrator, Civil Service (Medical): No    Lack of Transportation (Non-Medical): No  Physical Activity: Sufficiently Active (06/11/2023)   Exercise Vital Sign    Days of Exercise per Week: 5 days    Minutes of Exercise per Session: 50 min  Stress: Stress Concern Present (06/11/2023)   [employer] of Occupational Health - Occupational Stress Questionnaire    Feeling of Stress : Rather much  Social Connections: Moderately Integrated (06/11/2023)   Social Connection and Isolation Panel    Frequency of Communication with Friends and Family: More than three times a week    Frequency of Social Gatherings with Friends and Family: Twice a week    Attends Religious Services: More than 4 times per year    Active Member of [employer] or Organizations: Yes    Attends Engineer, structural: More than 4 times per year    Marital Status: Never married  Catering manager Violence: Not on file    FAMILY  HISTORY: Family History  Problem Relation Age of Onset   Hypercalcemia Mother    Hypertension Mother    Hypertension Father    Prostate cancer Father    Multiple myeloma Father     ALLERGIES:  is allergic to gabapentin , lisinopril , and nickel.  MEDICATIONS:  Current Outpatient Medications  Medication Sig Dispense Refill   acetaminophen  (TYLENOL ) 650 MG CR tablet Take 650 mg by mouth every 8 (eight) hours as needed for pain.     albuterol  (VENTOLIN  HFA) 108 (90 Base) MCG/ACT inhaler Inhale 2 puffs into the lungs every 6 (six) hours as needed for wheezing or shortness of breath. 1 each 1   amLODipine  (NORVASC ) 10 MG tablet Take 1 tablet by mouth once daily 90 tablet 0   azelastine  (ASTELIN ) 0.1 % nasal spray 1-2 puffs in each nostril twice daily     azelastine  (OPTIVAR ) 0.05 % ophthalmic solution Apply to eye.  [name]  50-200-25 MG TABS tablet TAKE 1 TABLET DAILY 30 tablet 5   BREO ELLIPTA  200-25 MCG/ACT AEPB Inhale 1 puff by mouth once daily 60 each 0   EPINEPHrine  0.3 mg/0.3 mL IJ SOAJ injection Inject 0.3 mg into the muscle once.     famotidine  (PEPCID ) 40 MG tablet Take 1 tablet (40 mg total) by mouth daily for 14 days. 14 tablet 0   lidocaine -prilocaine  (EMLA ) cream Apply 1 Application topically as needed. 30 g 0   loratadine  (CLARITIN ) 10 MG tablet Take 10 mg by mouth daily.     methylPREDNISolone  (MEDROL  DOSEPAK) 4 MG TBPK tablet Take as instructed by dose packaging 21 each 0   montelukast  (SINGULAIR ) 10 MG tablet Take 10 mg by mouth at bedtime.     mupirocin  ointment (BACTROBAN ) 2 % Apply 1 Application topically 2 (two) times daily. Apply topical to the affected area twice a day until clear 30 g 3   ondansetron  (ZOFRAN ) 8 MG tablet Take 1 tablet (8 mg total) by mouth every 8 (eight) hours as needed. 30 tablet 0   ondansetron  (ZOFRAN -ODT) 8 MG disintegrating tablet Take 1 tablet (8 mg total) by mouth every 8 (eight) hours as needed for nausea or vomiting. 30 tablet 0    oxyCODONE  (OXYCONTIN ) 10 mg 12 hr tablet Take 1 tablet (10 mg total) by mouth every 12 (twelve) hours. 14 tablet 0   oxyCODONE  10 MG TABS Take 1 tablet (10 mg total) by mouth every 4 (four) hours as needed for severe pain. 90 tablet 0   phentermine  30 MG capsule Take 30 mg by mouth daily.     prochlorperazine  (COMPAZINE ) 10 MG tablet Take 1 tablet (10 mg total) by mouth every 6 (six) hours as needed for nausea or vomiting. 30 tablet 0   sildenafil  (VIAGRA ) 100 MG tablet Take 1 tablet (100 mg total) by mouth daily as needed for erectile dysfunction. 10 tablet 1   topiramate  (TOPAMAX ) 50 MG tablet Take 50 mg by mouth daily.     Vitamin D, Ergocalciferol, (DRISDOL) 1.25 MG (50000 UNIT) CAPS capsule Take 50,000 Units by mouth once a week.     No current facility-administered medications for this visit.    REVIEW OF SYSTEMS:   Constitutional: ( - ) fevers, ( - )  chills , ( - ) night sweats Eyes: ( - ) blurriness of vision, ( - ) double vision, ( - ) watery eyes Ears, nose, mouth, throat, and face: ( - ) mucositis, ( - ) sore throat Respiratory: ( - ) cough, ( - ) dyspnea, ( - ) wheezes Cardiovascular: ( - ) palpitation, ( - ) chest discomfort, ( - ) lower extremity swelling Gastrointestinal:  ( - ) nausea, ( - ) heartburn, ( - ) change in bowel habits Skin: ( - ) abnormal skin rashes Lymphatics: ( - ) new lymphadenopathy, ( - ) easy bruising Neurological: ( - ) numbness, ( - ) tingling, ( - ) new weaknesses Behavioral/Psych: ( - ) mood change, ( - ) new changes  All other systems were reviewed with the patient and are negative.  PHYSICAL EXAMINATION: ECOG PERFORMANCE STATUS: 1 - Symptomatic but completely ambulatory  Vitals:   01/23/24 1414  BP: 116/87  Pulse: 88  Resp: 14  Temp: 98.7 F (37.1 C)  SpO2: 99%      Filed Weights   01/23/24 1414  Weight: 274 lb 14.4 oz (124.7 kg)       GENERAL: Well-appearing young African-American male,  alert, no distress and  comfortable SKIN: skin color, texture, turgor are normal, no rashes. EYES: conjunctiva are pink and non-injected, sclera clear LUNGS: clear to auscultation and percussion with normal breathing effort HEART: regular rate & rhythm and no murmurs and no lower extremity edema Musculoskeletal: no cyanosis of digits and no clubbing  PSYCH: alert & oriented x 3, fluent speech NEURO: no focal motor/sensory deficits  LABORATORY DATA:  I have reviewed the data as listed    Latest Ref Rng & Units 01/23/2024    1:47 PM 10/30/2023    2:02 PM 10/24/2023    1:03 PM  CBC  WBC 4.0 - 10.5 K/uL 7.1  8.4  7.3   Hemoglobin 13.0 - 17.0 g/dL 82.9  84.0  84.6   Hematocrit 39.0 - 52.0 % 46.7  46.4  43.5   Platelets 150 - 400 K/uL 202  228  183        Latest Ref Rng & Units 01/23/2024    1:47 PM 10/30/2023    2:02 PM 10/24/2023    1:03 PM  CMP  Glucose 70 - 99 mg/dL 76  78  98   BUN 6 - 20 mg/dL 19  13  15    Creatinine 0.61 - 1.24 mg/dL 8.31  8.55  8.56   Sodium 135 - 145 mmol/L 135  137  138   Potassium 3.5 - 5.1 mmol/L 3.9  4.2  3.9   Chloride 98 - 111 mmol/L 108  106  110   CO2 22 - 32 mmol/L 21  17  24    Calcium 8.9 - 10.3 mg/dL 9.0  9.1  8.9   Total Protein 6.5 - 8.1 g/dL 8.7  8.5  8.6   Total Bilirubin 0.0 - 1.2 mg/dL 0.7  0.4  0.5   Alkaline Phos 38 - 126 U/L 94  109  90   AST 15 - 41 U/L 14  15  13    ALT 0 - 44 U/L 9  13  12       RADIOGRAPHIC STUDIES: I have personally reviewed the radiological images as listed and agreed with the findings in the report: FDG avid lymph nodes on both sides of the diaphragm, most prominently in the cervical regions and axilla.  Consistent with at least stage III disease. No results found.   ASSESSMENT & PLAN Lee [name] is a 43 y.o. male with medical history significant for stage III nodular sclerosing Hodgkin's lymphoma who presents for a follow up visit.    # Hodgkin's Lymphoma, Stage III -- PET CT scan staging complete, findings consistent with stage  III Hodgkin's lymphoma. --Will plan to proceed with A-AVD chemotherapy. --Echocardiogram complete, shows excellent baseline cardiac function. --Mid treatment PET CT scan on 12/19/2022 showed excellent response to treatment (Deauville 2).  --Due to worsening skin lesions that is likely flaring up from Udenyca  injection, we held Brentuximab and Udenyca  injection starting Cycle 2, Day 15.  Plan:  --Labs previously showed Hgb 17, white blood cell 7.1, MCV 87, platelets 202. LFTs normal.  -- Patient has completed 6 cycles of A-AVD chemotherapy.  --Post treatment PET scan shows no evidence of residual or recurrent disease. CT scan on 10/15/2023 showed no evidence of recurrence, next due in Sept 2025.  --Strict neutropenic precautions given including monitor for fevers --Proceed with treatment without any further dose modifications.  --RTC in 3 months time with repeat CT scan  #Hidradenitis Suppurativa--resolved --Under the care of dermatology and relates flare ups to GCSF injection  which has greatly improved since discontinuing GCSF.  --Continue with oral doxycycline  and intralesional kenalog  as needed  #Constipation-stable: --Likely secondary to opoid pain medication --Recommend to try OTC stool softeners such as senna or miralax .    #Supportive Care -- chemotherapy education complete -- port removal order placed.  -- zofran  8mg  q8H PRN and compazine  10mg  PO q6H for nausea -- EMLA  cream for port.  -- no pain medication required at this time.   Orders Placed This Encounter  Procedures   CT CHEST ABDOMEN PELVIS W CONTRAST    Standing Status:   Future    Expected Date:   04/14/2024    Expiration Date:   01/26/2025    If indicated for the ordered procedure, I authorize the administration of contrast media per Radiology protocol:   Yes    Does the patient have a contrast media/X-ray dye allergy?:   No    Preferred imaging location?:   [facility]    If indicated for the ordered  procedure, I authorize the administration of oral contrast media per Radiology protocol:   Yes   All questions were answered. The patient knows to call the clinic with any problems, questions or concerns.  I have spent a total of 25 minutes minutes of face-to-face and non-face-to-face time, preparing to see the patient,performing a medically appropriate examination, counseling and educating the patient, ordering medications/tests/procedures,documenting clinical information in the electronic health record, and care coordination.   Norleen IVAR Kidney, Lee [name] Department of Hematology/Oncology [facility] Phone: [phone] Pager: [phone] Email: [email or web address]  01/27/2024 5:48 PM

## 2024-01-28 ENCOUNTER — Encounter: Payer: Self-pay | Admitting: Hematology and Oncology

## 2024-01-31 ENCOUNTER — Other Ambulatory Visit: Payer: Self-pay | Admitting: Infectious Diseases

## 2024-01-31 DIAGNOSIS — T7840XD Allergy, unspecified, subsequent encounter: Secondary | ICD-10-CM

## 2024-01-31 NOTE — Telephone Encounter (Signed)
LOV: 11/07/23

## 2024-02-28 ENCOUNTER — Other Ambulatory Visit: Payer: Self-pay | Admitting: Infectious Diseases

## 2024-02-28 DIAGNOSIS — T7840XD Allergy, unspecified, subsequent encounter: Secondary | ICD-10-CM

## 2024-02-28 NOTE — Telephone Encounter (Signed)
 Medication sent to pharmacy

## 2024-03-04 ENCOUNTER — Other Ambulatory Visit: Payer: Self-pay | Admitting: Infectious Diseases

## 2024-03-04 DIAGNOSIS — Z21 Asymptomatic human immunodeficiency virus [HIV] infection status: Secondary | ICD-10-CM

## 2024-03-23 ENCOUNTER — Other Ambulatory Visit: Payer: Self-pay | Admitting: Infectious Diseases

## 2024-03-23 DIAGNOSIS — I10 Essential (primary) hypertension: Secondary | ICD-10-CM

## 2024-03-25 NOTE — Telephone Encounter (Signed)
 Medication sent to pharmacy

## 2024-04-14 ENCOUNTER — Ambulatory Visit (HOSPITAL_COMMUNITY)
Admission: RE | Admit: 2024-04-14 | Discharge: 2024-04-14 | Disposition: A | Discharge status: Home / Self Care | Source: Ambulatory Visit | Attending: Hematology and Oncology | Admitting: Hematology and Oncology

## 2024-04-14 DIAGNOSIS — C8118 Nodular sclerosis classical Hodgkin lymphoma, lymph nodes of multiple sites: Secondary | ICD-10-CM | POA: Diagnosis present

## 2024-04-14 MED ORDER — IOHEXOL 300 MG/ML  SOLN
100.0000 mL | Freq: Once | INTRAMUSCULAR | Status: AC | PRN
  Administered 2024-04-14: 100 mL via INTRAVENOUS

## 2024-04-16 ENCOUNTER — Ambulatory Visit: Payer: Self-pay | Admitting: *Deleted

## 2024-04-16 NOTE — Telephone Encounter (Signed)
-----   Message from Norleen ONEIDA Kidney IV sent at 04/15/2024  9:40 AM EDT ----- Please let Lee [name] know that his CT scan showed no evidence of residual or recurrent disease. We will see him back as scheduled in early Oct 2025.  ----- Message ----- From: Rebecka, Rad Results In Sent: 04/14/2024   3:50 PM EDT To: [name], MD

## 2024-04-16 NOTE — Telephone Encounter (Signed)
 TCT patient regarding recent scan results. Spoke with him. Advised that his CT scan showed no evidence of residual or recurrent disease. We will see him back as scheduled in early Oct 2025. Pt voiced understanding and is aware of his future appts.

## 2024-04-29 ENCOUNTER — Other Ambulatory Visit

## 2024-04-29 ENCOUNTER — Other Ambulatory Visit (HOSPITAL_COMMUNITY)
Admission: RE | Admit: 2024-04-29 | Discharge: 2024-04-29 | Disposition: A | Discharge status: Home / Self Care | Source: Ambulatory Visit | Attending: Internal Medicine | Admitting: Internal Medicine

## 2024-04-29 DIAGNOSIS — Z21 Asymptomatic human immunodeficiency virus [HIV] infection status: Secondary | ICD-10-CM

## 2024-04-29 DIAGNOSIS — Z113 Encounter for screening for infections with a predominantly sexual mode of transmission: Secondary | ICD-10-CM | POA: Insufficient documentation

## 2024-04-29 DIAGNOSIS — Z79899 Other long term (current) drug therapy: Secondary | ICD-10-CM

## 2024-04-29 IMAGING — CR DG RIBS W/ CHEST 3+V*R*
3 series · 3 of 3 positions shown · non-contrast
Comparison: Chest x-ray 07/14/2021

CLINICAL DATA: right rib pain with vomiting

EXAM:
RIGHT RIBS AND CHEST - 3+ VIEW

[w chest pa]
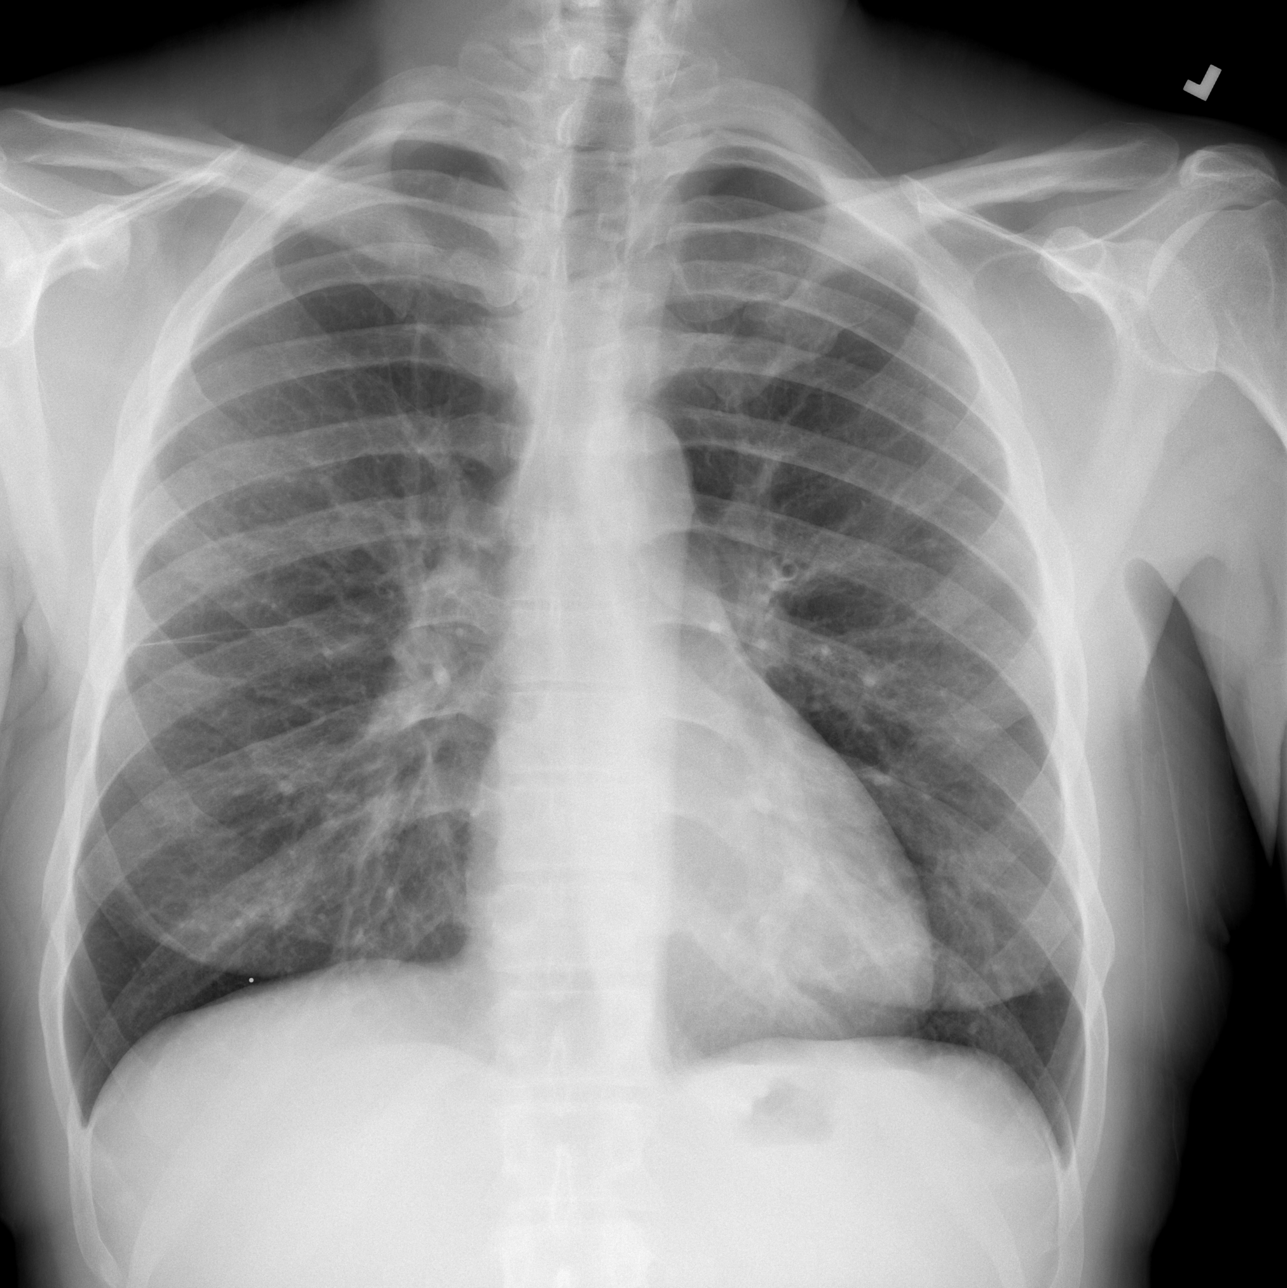

[w ribs ap/pa upper right]
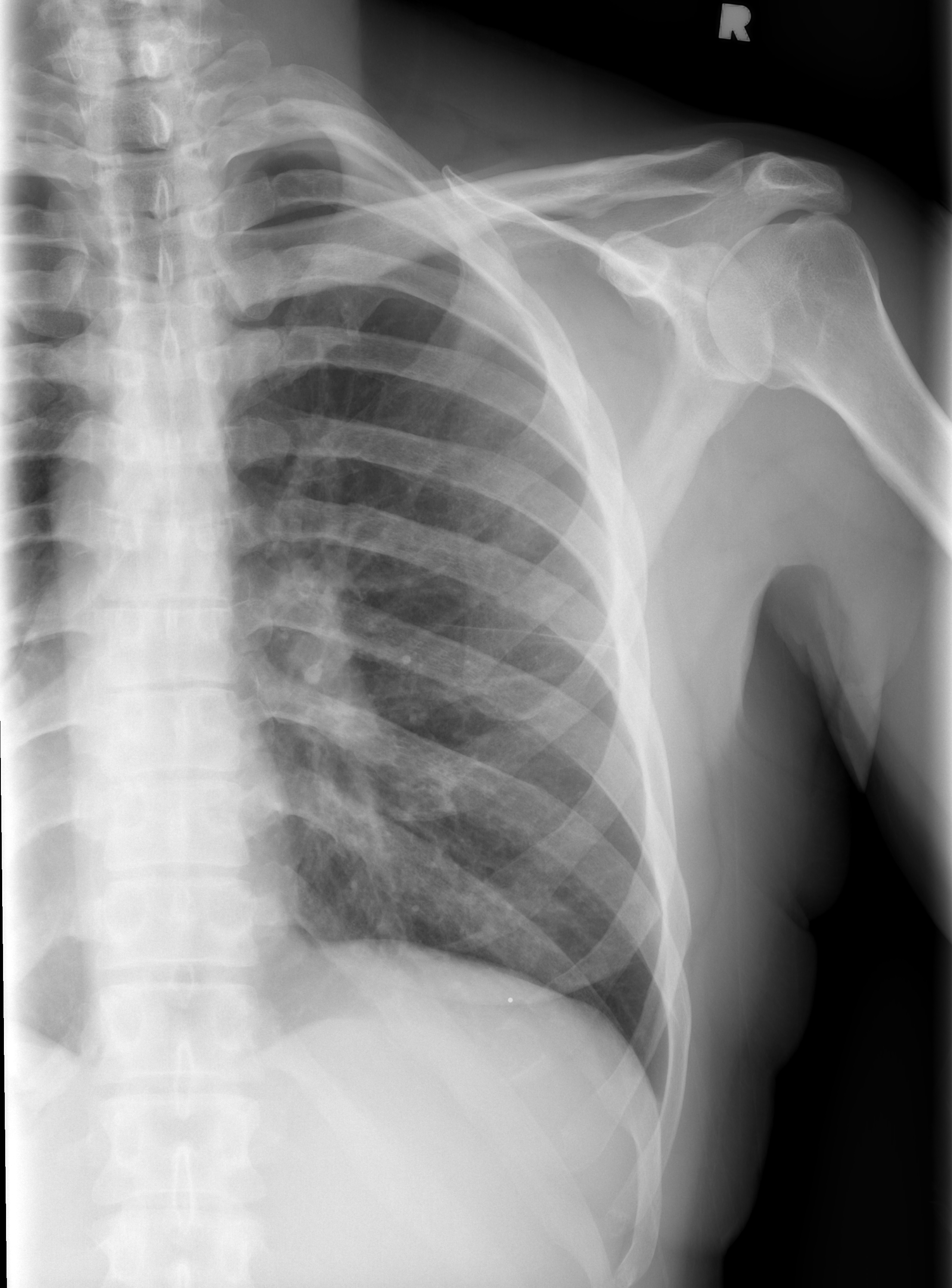

[w ribs oblique right]
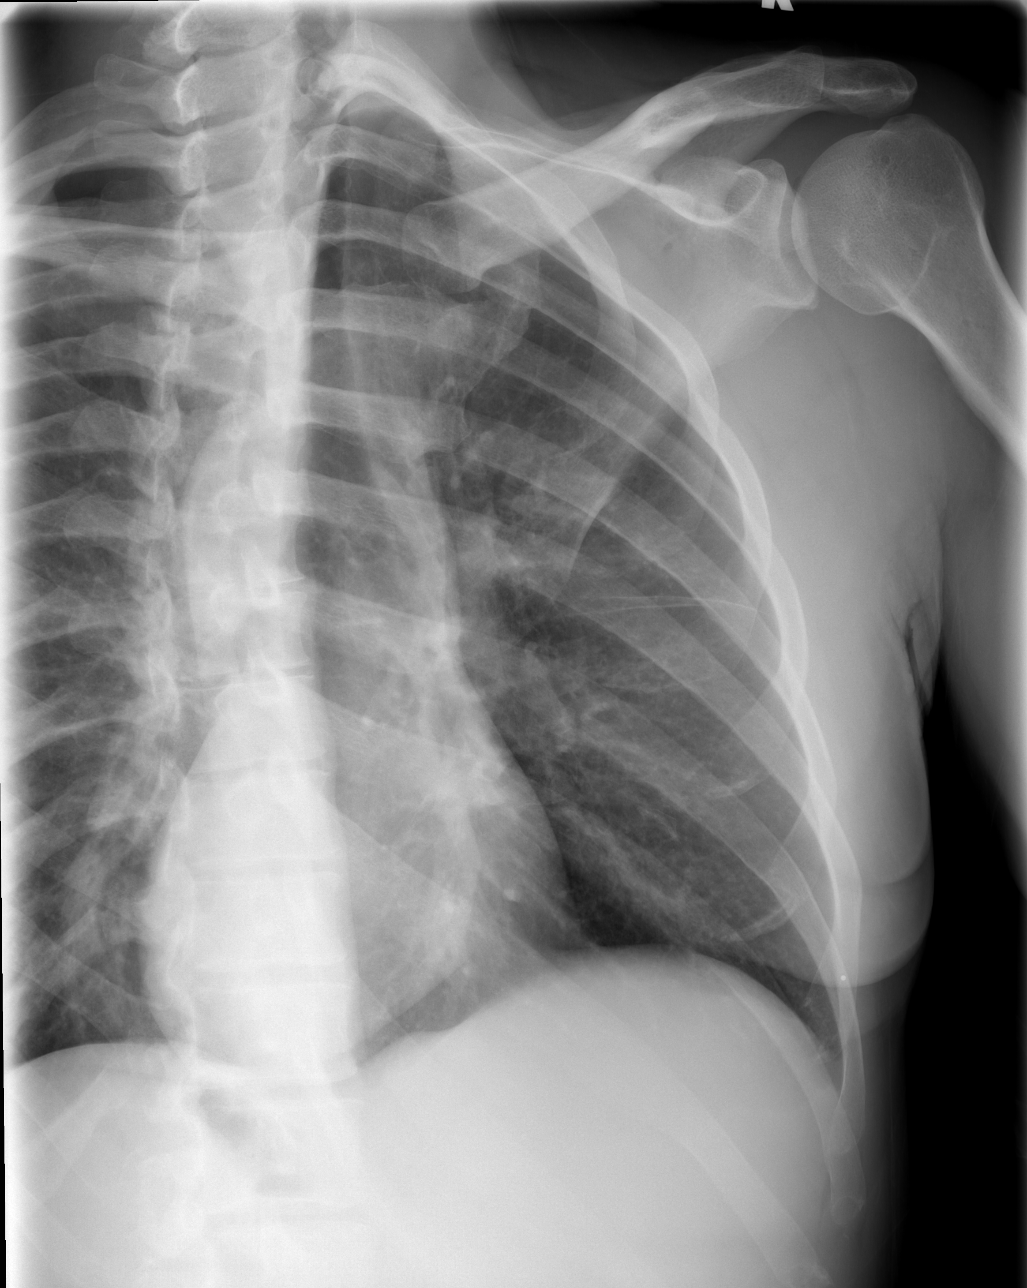

[3 of 3 positions shown; findings below may reference images not displayed]

FINDINGS: The heart and mediastinal contours are within normal limits.

No focal consolidation. No pulmonary edema. No pleural effusion. No
pneumothorax.

Nipple markers noted bilaterally.

No acute displaced fracture or other bone lesions are seen involving
the ribs.
IMPRESSION: 1. No acute displaced right rib fractures. Please note, nondisplaced
rib fractures may be occult on radiograph.
2. No acute cardiopulmonary abnormality.

## 2024-04-30 ENCOUNTER — Inpatient Hospital Stay

## 2024-04-30 ENCOUNTER — Encounter: Payer: Self-pay | Admitting: Hematology and Oncology

## 2024-04-30 ENCOUNTER — Other Ambulatory Visit: Payer: Self-pay | Admitting: Hematology and Oncology

## 2024-04-30 ENCOUNTER — Inpatient Hospital Stay: Attending: Hematology and Oncology | Admitting: Hematology and Oncology

## 2024-04-30 VITALS — BP 126/86 | HR 98 | Temp 98.1°F | Resp 16 | Wt 243.7 lb

## 2024-04-30 DIAGNOSIS — Z8571 Personal history of Hodgkin lymphoma: Secondary | ICD-10-CM | POA: Diagnosis present

## 2024-04-30 DIAGNOSIS — C8118 Nodular sclerosis classical Hodgkin lymphoma, lymph nodes of multiple sites: Secondary | ICD-10-CM

## 2024-04-30 DIAGNOSIS — K5900 Constipation, unspecified: Secondary | ICD-10-CM | POA: Insufficient documentation

## 2024-04-30 DIAGNOSIS — Z08 Encounter for follow-up examination after completed treatment for malignant neoplasm: Secondary | ICD-10-CM | POA: Diagnosis present

## 2024-04-30 DIAGNOSIS — Z9221 Personal history of antineoplastic chemotherapy: Secondary | ICD-10-CM | POA: Insufficient documentation

## 2024-04-30 LAB — T-HELPER CELL (CD4) - (RCID CLINIC ONLY)
CD4 % Helper T Cell: 41 % (ref 33–65)
CD4 T Cell Abs: 944 /uL (ref 400–1790)

## 2024-04-30 LAB — URINE CYTOLOGY ANCILLARY ONLY
Chlamydia: NEGATIVE
Comment: NEGATIVE
Comment: NORMAL
Neisseria Gonorrhea: NEGATIVE

## 2024-04-30 NOTE — Progress Notes (Signed)
 [facility] [phone]) [phone]   [phone]  PROGRESS NOTE  Patient Care Team: Merna Huxley, NP as PCP - General (Family Medicine) Frutoso Luz, MD as Referring Physician (Allergy) Federico Norleen Lee MADISON, MD as Consulting Physician (Hematology and Oncology)  Hematological/Oncological History # Hodgkin's Lymphoma, Stage III 06/08/2022: CT neck showed Bulky adenopathy in the deep right pectoral region, recommend chest CT with contrast. There was splenomegaly on abdominal CT from the summer, consider addition of abdominal CT is well. 07/01/2023: CT chest showed Bulky RIGHT axillary lymph node enlargement, with nodal mass measuring 7 cm in length. Findings suspicious for lymphoproliferative disease 09/13/2022: US  guided biopsy of right axillary lymphadenopathy was consistent with classical Hodgkin's lymphoma  10/06/2022: establish care with Dr. Federico  10/31/2022: Cycle 1 Day 1 of A-AVD chemotherapy.  11/14/2022: Cycle 1 Day 15 of A-AVD chemotherapy 11/29/2022: Cycle 2 Day 1 of A-AVD chemotherapy 12/13/2022: Cycle 2 Day 15 of A-AVD chemotherapy (holding brentuximab and Udenyca  due to worsening skin lesions from hidradenitis suppurativa) 12/19/2022: CT PET Study showed near complete metabolic response with residual 18 mm short axis right axillary node (Deauville criteria 2). 12/27/2022: Cycle 3 Day 1 of AVD chemotherapy 01/10/2023: Cycle 3 Day 15 of AVD chemotherapy 01/23/2023:  Cycle 4 Day 1 of AVD chemotherapy 02/06/2023: Cycle 4 Day 15 of AVD chemotherapy 02/20/2023: Cycle 5 Day 1 of AVD chemotherapy 03/07/2023: Cycle 5 Day 15 of AVD chemotherapy 03/20/2023: Cycle 6 Day 1 of AVD chemotherapy 04/04/2023: Cycle 6 Day 15 of AVD chemotherapy 04/18/2023: post treatment PET CT scan shows complete response to therapy.   Interval History:  Lee [name] 43 y.o. male with medical history significant for stage III nodular sclerosing Hodgkin's lymphoma who presents for a follow up  visit. The patient's last visit was on 01/23/2024. In the interim since the last visit he has had no major changes in his health.  On exam today Lee [name] reports he has been well overall in the interim since our last visit.  He notes that he has upcoming visit with ENT because he feels like he is ears are clogging and draining.  He reports that he needs them to take the wax out.  He notes that he has not been using Debrox drops.  He notes that he has had tubes in his ear in the past and has a longstanding relationship with ENT.  He reports that he is been going through this since birth.  He notes that he did recently have a cough and thinks it may be related to his ear issues.  He notes his energy levels are good and he is working out at [name] doing cardio and weightlifting.  His weight has continued to drop intentionally, down to 243 pounds today.  He notes that he is eating well and has not had any other infectious symptoms such as fevers, chills, sweats, nausea vomiting or diarrhea.  Overall he feels quite well and has no additional questions concerns or complaints today.  Full 10 point ROS is otherwise negative.  MEDICAL HISTORY:  Past Medical History:  Diagnosis Date   CKD (chronic kidney disease) stage 3, GFR 30-59 ml/min (HCC) 02/20/2019   Eczema    Environmental allergies    Essential hypertension    HIV test positive (HCC)    Hodgkin's lymphoma (HCC)    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HCC) 07/24/2016   Shingles     SURGICAL HISTORY: Past Surgical History:  Procedure Laterality Date   CHOLECYSTECTOMY  N/A 12/30/2021   Procedure: LAPAROSCOPIC CHOLECYSTECTOMY WITH  CHOLANGIOGRAM;  Surgeon: Eletha Boas, MD;  Location: [facility];  Service: General;  Laterality: N/A;   IR IMAGING GUIDED PORT INSERTION  10/18/2022   IR REMOVAL TUN ACCESS W/ PORT W/O FL MOD SED  11/16/2023   LAPAROSCOPIC APPENDECTOMY N/A 03/12/2020   Procedure: APPENDECTOMY LAPAROSCOPIC;  Surgeon: Debby Hila,  MD;  Location: [facility];  Service: General;  Laterality: N/A;   TYMPANOSTOMY TUBE PLACEMENT     WISDOM TOOTH EXTRACTION     WRIST SURGERY      SOCIAL HISTORY: Social History   Socioeconomic History   Marital status: Single    Spouse name: Not on file   Number of children: Not on file   Years of education: Not on file   Highest education level: Associate degree: academic program  Occupational History   Not on file  Tobacco Use   Smoking status: Never   Smokeless tobacco: Never  Vaping Use   Vaping status: Never Used  Substance and Sexual Activity   Alcohol  use: No   Drug use: No   Sexual activity: Not Currently    Partners: Male    Birth control/protection: Condom    Comment: Given Condoms  Other Topics Concern   Not on file  Social History Narrative   Works as Acupuncturist    -Not married   Social Drivers of Corporate investment banker Strain: Low Risk  (06/11/2023)   Overall Financial Resource Strain (CARDIA)    Difficulty of Paying Living Expenses: Not hard at all  Food Insecurity: No Food Insecurity (11/07/2023)   Hunger Vital Sign    Worried About Running Out of Food in the Last Year: Never true    Ran Out of Food in the Last Year: Never true  Transportation Needs: No Transportation Needs (11/07/2023)   PRAPARE - Administrator, Civil Service (Medical): No    Lack of Transportation (Non-Medical): No  Physical Activity: Sufficiently Active (06/11/2023)   Exercise Vital Sign    Days of Exercise per Week: 5 days    Minutes of Exercise per Session: 50 min  Stress: Stress Concern Present (06/11/2023)   [employer] of Occupational Health - Occupational Stress Questionnaire    Feeling of Stress : Rather much  Social Connections: Moderately Integrated (06/11/2023)   Social Connection and Isolation Panel    Frequency of Communication with Friends and Family: More than three times a week    Frequency of Social Gatherings with Friends and Family: Twice a week     Attends Religious Services: More than 4 times per year    Active Member of [employer] or Organizations: Yes    Attends Engineer, structural: More than 4 times per year    Marital Status: Never married  Catering manager Violence: Not on file    FAMILY HISTORY: Family History  Problem Relation Age of Onset   Hypercalcemia Mother    Hypertension Mother    Hypertension Father    Prostate cancer Father    Multiple myeloma Father     ALLERGIES:  is allergic to gabapentin , lisinopril , and nickel.  MEDICATIONS:  Current Outpatient Medications  Medication Sig Dispense Refill   acetaminophen  (TYLENOL ) 650 MG CR tablet Take 650 mg by mouth every 8 (eight) hours as needed for pain.     albuterol  (VENTOLIN  HFA) 108 (90 Base) MCG/ACT inhaler Inhale 2 puffs into the lungs every 6 (six) hours as needed for wheezing or shortness  of breath. 1 each 1   amLODipine  (NORVASC ) 10 MG tablet Take 1 tablet by mouth once daily 90 tablet 0   azelastine  (ASTELIN ) 0.1 % nasal spray 1-2 puffs in each nostril twice daily     azelastine  (OPTIVAR ) 0.05 % ophthalmic solution Apply to eye.     [name]  50-200-25 MG TABS tablet TAKE 1 TABLET DAILY 30 tablet 5   BREO ELLIPTA  200-25 MCG/ACT AEPB Inhale 1 puff by mouth once daily 60 each 0   EPINEPHrine  0.3 mg/0.3 mL IJ SOAJ injection Inject 0.3 mg into the muscle once.     lidocaine -prilocaine  (EMLA ) cream Apply 1 Application topically as needed. 30 g 0   loratadine  (CLARITIN ) 10 MG tablet Take 10 mg by mouth daily.     montelukast  (SINGULAIR ) 10 MG tablet Take 10 mg by mouth at bedtime.     mupirocin  ointment (BACTROBAN ) 2 % Apply 1 Application topically 2 (two) times daily. Apply topical to the affected area twice a day until clear 30 g 3   ondansetron  (ZOFRAN ) 8 MG tablet Take 1 tablet (8 mg total) by mouth every 8 (eight) hours as needed. 30 tablet 0   ondansetron  (ZOFRAN -ODT) 8 MG disintegrating tablet Take 1 tablet (8 mg total) by mouth every 8 (eight)  hours as needed for nausea or vomiting. 30 tablet 0   oxyCODONE  (OXYCONTIN ) 10 mg 12 hr tablet Take 1 tablet (10 mg total) by mouth every 12 (twelve) hours. 14 tablet 0   oxyCODONE  10 MG TABS Take 1 tablet (10 mg total) by mouth every 4 (four) hours as needed for severe pain. 90 tablet 0   phentermine  30 MG capsule Take 30 mg by mouth daily.     prochlorperazine  (COMPAZINE ) 10 MG tablet Take 1 tablet (10 mg total) by mouth every 6 (six) hours as needed for nausea or vomiting. 30 tablet 0   sildenafil  (VIAGRA ) 100 MG tablet Take 1 tablet (100 mg total) by mouth daily as needed for erectile dysfunction. 10 tablet 1   topiramate  (TOPAMAX ) 50 MG tablet Take 50 mg by mouth daily.     Vitamin D, Ergocalciferol, (DRISDOL) 1.25 MG (50000 UNIT) CAPS capsule Take 50,000 Units by mouth once a week.     No current facility-administered medications for this visit.    REVIEW OF SYSTEMS:   Constitutional: ( - ) fevers, ( - )  chills , ( - ) night sweats Eyes: ( - ) blurriness of vision, ( - ) double vision, ( - ) watery eyes Ears, nose, mouth, throat, and face: ( - ) mucositis, ( - ) sore throat Respiratory: ( - ) cough, ( - ) dyspnea, ( - ) wheezes Cardiovascular: ( - ) palpitation, ( - ) chest discomfort, ( - ) lower extremity swelling Gastrointestinal:  ( - ) nausea, ( - ) heartburn, ( - ) change in bowel habits Skin: ( - ) abnormal skin rashes Lymphatics: ( - ) new lymphadenopathy, ( - ) easy bruising Neurological: ( - ) numbness, ( - ) tingling, ( - ) new weaknesses Behavioral/Psych: ( - ) mood change, ( - ) new changes  All other systems were reviewed with the patient and are negative.  PHYSICAL EXAMINATION: ECOG PERFORMANCE STATUS: 1 - Symptomatic but completely ambulatory  Vitals:   04/30/24 1416  BP: 126/86  Pulse: 98  Resp: 16  Temp: 98.1 F (36.7 C)  SpO2: 100%       Filed Weights   04/30/24 1416  Weight: 243 lb  11.2 oz (110.5 kg)        GENERAL: Well-appearing young  African-American male, alert, no distress and comfortable SKIN: skin color, texture, turgor are normal, no rashes. EYES: conjunctiva are pink and non-injected, sclera clear LUNGS: clear to auscultation and percussion with normal breathing effort HEART: regular rate & rhythm and no murmurs and no lower extremity edema Musculoskeletal: no cyanosis of digits and no clubbing  PSYCH: alert & oriented x 3, fluent speech NEURO: no focal motor/sensory deficits  LABORATORY DATA:  I have reviewed the data as listed    Latest Ref Rng & Units 04/29/2024    2:15 PM 01/23/2024    1:47 PM 10/30/2023    2:02 PM  CBC  WBC 3.4 - 10.8 x10E3/uL 6.6  7.1  8.4   Hemoglobin 13.0 - 17.7 g/dL 83.3  82.9  84.0   Hematocrit 37.5 - 51.0 % 49.7  46.7  46.4   Platelets 150 - 450 x10E3/uL 294  202  228        Latest Ref Rng & Units 04/29/2024    2:15 PM 01/23/2024    1:47 PM 10/30/2023    2:02 PM  CMP  Glucose 70 - 99 mg/dL 76  76  78   BUN 6 - 24 mg/dL 10  19  13    Creatinine 0.76 - 1.27 mg/dL 8.57  8.31  8.55   Sodium 134 - 144 mmol/L 137  135  137   Potassium 3.5 - 5.2 mmol/L 4.5  3.9  4.2   Chloride 96 - 106 mmol/L 104  108  106   CO2 20 - 29 mmol/L 19  21  17    Calcium 8.7 - 10.2 mg/dL 9.4  9.0  9.1   Total Protein 6.0 - 8.5 g/dL 8.7  8.7  8.5   Total Bilirubin 0.0 - 1.2 mg/dL 0.6  0.7  0.4   Alkaline Phos 47 - 123 IU/L 107  94  109   AST 0 - 40 IU/L 11  14  15    ALT 0 - 44 IU/L 7  9  13       RADIOGRAPHIC STUDIES: I have personally reviewed the radiological images as listed and agreed with the findings in the report: FDG avid lymph nodes on both sides of the diaphragm, most prominently in the cervical regions and axilla.  Consistent with at least stage III disease. CT CHEST ABDOMEN PELVIS W CONTRAST Result Date: 04/14/2024 CLINICAL DATA:  Hematologic malignancy, monitor. Nodular sclerosing Hodgkin's lymphoma. * Tracking Code: BO * EXAM: CT CHEST, ABDOMEN, AND PELVIS WITH CONTRAST TECHNIQUE: Multidetector  CT imaging of the chest, abdomen and pelvis was performed following the standard protocol during bolus administration of intravenous contrast. RADIATION DOSE REDUCTION: This exam was performed according to the departmental dose-optimization program which includes automated exposure control, adjustment of the mA and/or kV according to patient size and/or use of iterative reconstruction technique. CONTRAST:  OMNIPAQUE  IOHEXOL  300 MG/ML  SOLN COMPARISON:  Multiple priors including CT October 15, 2023 FINDINGS: CT CHEST FINDINGS Cardiovascular: Normal caliber thoracic aorta. Normal size heart. No significant pericardial effusion/thickening. Mediastinum/Nodes: No suspicious thyroid  nodule. Decreased size of the right axillary/subpectoral with the previously indexed subpectoral lymph node measuring 13 mm in short axis on image 14/2 previously 17 mm. Similar size of the prominent mediastinal lymph nodes, for reference a prevascular mediastinal lymph node measures 6 mm in short axis on image 27/2, unchanged Lungs/Pleura: Previously indexed pulmonary nodules are as follows: -right upper lobe Peri  fissural pulmonary nodule measures 2 mm on image 72/4, unchanged -pulmonary nodule in the left lung apex measures 3 mm on image 37/4, previously 4 mm. -subpleural right upper lobe pulmonary nodule on image 76/4 measures 3 mm, unchanged New 6 mm right upper lobe ground-glass pulmonary nodule on image 48/4. Similar mild diffuse bronchial wall thickening with scattered areas of mucoid impaction. Musculoskeletal: No aggressive lytic or blastic lesion of bone. CT ABDOMEN PELVIS FINDINGS Hepatobiliary: No suspicious hepatic lesion. Gallbladder surgically absent. No biliary ductal dilation. Pancreas: No pancreatic ductal dilation or evidence of acute inflammation. Spleen: No splenomegaly. Adrenals/Urinary Tract: No suspicious adrenal nodule/mass. Hypodense renal lesions are technically too small to accurately characterize but  statistically likely reflect cysts. No hydronephrosis. Kidneys demonstrate symmetric enhancement. Urinary bladder is unremarkable for degree of distension. Stomach/Bowel: Stomach is unremarkable for degree of distension. No pathologic dilation of small or large bowel. Suture material along the cecum. Vascular/Lymphatic: Aortic atherosclerosis. Normal caliber abdominal aorta. Smooth IVC contours. The portal, splenic and superior mesenteric veins are patent. No pathologically enlarged abdominal or pelvic lymph nodes. Reproductive: Prostate is unremarkable. Other: No significant abdominopelvic free fluid. Musculoskeletal: No aggressive lytic or blastic lesion of bone. Lower lumbar discogenic disease. IMPRESSION: 1. Decreased size of the right axillary/subpectoral lymph nodes. No evidence of new or progressive lymphadenopathy in the chest, abdomen or pelvis. No splenomegaly. 2. New 6 mm right upper lobe ground-glass pulmonary nodule, nonspecific but favored to reflect an infectious or inflammatory etiology. Suggest attention on follow-up imaging. 3.  Aortic Atherosclerosis (ICD10-I70.0). Electronically Signed   By: Reyes Holder M.D.   On: 04/14/2024 15:48     ASSESSMENT & PLAN Lee [name] is a 43 y.o. male with medical history significant for stage III nodular sclerosing Hodgkin's lymphoma who presents for a follow up visit.    # Hodgkin's Lymphoma, Stage III -- PET CT scan staging complete, findings consistent with stage III Hodgkin's lymphoma. --Will plan to proceed with A-AVD chemotherapy. --Echocardiogram complete, shows excellent baseline cardiac function. --Mid treatment PET CT scan on 12/19/2022 showed excellent response to treatment (Deauville 2).  --Due to worsening skin lesions that is likely flaring up from Udenyca  injection, we held Brentuximab and Udenyca  injection starting Cycle 2, Day 15.  Plan:  --Labs previously showed Hgb 16.6, white blood cell 4.7, MCV 95, platelets 294. LFTs  normal.  -- Patient has completed 6 cycles of A-AVD chemotherapy.  --Post treatment PET scan shows no evidence of residual or recurrent disease. CT scan on 04/14/2024 showed no evidence of recurrence. Next CT scan in March 2026.  --Strict neutropenic precautions given including monitor for fevers --Proceed with treatment without any further dose modifications.  --RTC in 3 months time with repeat CT scan in 6 months.   #Hidradenitis Suppurativa--resolved --Under the care of dermatology and relates flare ups to GCSF injection which has greatly improved since discontinuing GCSF.  --Continue with oral doxycycline  and intralesional kenalog  as needed  #Constipation-stable: --Likely secondary to opoid pain medication --Recommend to try OTC stool softeners such as senna or miralax .    #Supportive Care -- chemotherapy education complete -- port removal order placed.  -- zofran  8mg  q8H PRN and compazine  10mg  PO q6H for nausea -- EMLA  cream for port.  -- no pain medication required at this time.   No orders of the defined types were placed in this encounter.  All questions were answered. The patient knows to call the clinic with any problems, questions or concerns.  I have spent a  total of 25 minutes minutes of face-to-face and non-face-to-face time, preparing to see the patient,performing a medically appropriate examination, counseling and educating the patient, ordering medications/tests/procedures,documenting clinical information in the electronic health record, and care coordination.   Norleen IVAR Kidney, MD Department of Hematology/Oncology [facility] Phone: [phone] Pager: [phone] Email: [email or web address]  05/05/2024 4:08 PM

## 2024-05-01 ENCOUNTER — Telehealth: Payer: Self-pay | Admitting: *Deleted

## 2024-05-01 ENCOUNTER — Other Ambulatory Visit: Payer: Self-pay

## 2024-05-01 ENCOUNTER — Encounter: Payer: Self-pay | Admitting: Hematology and Oncology

## 2024-05-01 LAB — COMPREHENSIVE METABOLIC PANEL WITH GFR
ALT: 7 IU/L (ref 0–44)
AST: 11 IU/L (ref 0–40)
Albumin: 4 g/dL — ABNORMAL LOW (ref 4.1–5.1)
Alkaline Phosphatase: 107 IU/L (ref 47–123)
BUN/Creatinine Ratio: 7 — ABNORMAL LOW (ref 9–20)
BUN: 10 mg/dL (ref 6–24)
Bilirubin Total: 0.6 mg/dL (ref 0.0–1.2)
CO2: 19 mmol/L — ABNORMAL LOW (ref 20–29)
Calcium: 9.4 mg/dL (ref 8.7–10.2)
Chloride: 104 mmol/L (ref 96–106)
Creatinine, Ser: 1.42 mg/dL — ABNORMAL HIGH (ref 0.76–1.27)
Globulin, Total: 4.7 g/dL — ABNORMAL HIGH (ref 1.5–4.5)
Glucose: 76 mg/dL (ref 70–99)
Potassium: 4.5 mmol/L (ref 3.5–5.2)
Sodium: 137 mmol/L (ref 134–144)
Total Protein: 8.7 g/dL — ABNORMAL HIGH (ref 6.0–8.5)
eGFR: 64 mL/min/1.73 (ref >59)

## 2024-05-01 LAB — LIPID PANEL
Chol/HDL Ratio: 5.5 ratio — ABNORMAL HIGH (ref 0.0–5.0)
Cholesterol, Total: 180 mg/dL (ref 100–199)
HDL: 33 mg/dL — ABNORMAL LOW (ref >39)
LDL Chol Calc (NIH): 133 mg/dL — ABNORMAL HIGH (ref 0–99)
Triglycerides: 73 mg/dL (ref 0–149)
VLDL Cholesterol Cal: 14 mg/dL (ref 5–40)

## 2024-05-01 LAB — CBC
Hematocrit: 49.7 % (ref 37.5–51.0)
Hemoglobin: 16.6 g/dL (ref 13.0–17.7)
MCH: 31.7 pg (ref 26.6–33.0)
MCHC: 33.4 g/dL (ref 31.5–35.7)
MCV: 95 fL (ref 79–97)
Platelets: 294 x10E3/uL (ref 150–450)
RBC: 5.23 x10E6/uL (ref 4.14–5.80)
RDW: 13.2 % (ref 11.6–15.4)
WBC: 6.6 x10E3/uL (ref 3.4–10.8)

## 2024-05-01 LAB — RPR: RPR Ser Ql: REACTIVE — AB

## 2024-05-01 LAB — HIV-1 RNA QUANT-NO REFLEX-BLD: HIV-1 RNA Viral Load: <20 {copies}/mL

## 2024-05-01 LAB — RPR, QUANT+TP ABS (REFLEX)
Rapid Plasma Reagin, Quant: 1:2 {titer} — ABNORMAL HIGH
T Pallidum Abs: REACTIVE — AB

## 2024-05-01 IMAGING — RF DG CHOLANGIOGRAM OPERATIVE
1 series · 4 of 4 positions shown · non-contrast
Comparison: Right upper quadrant ultrasound on 12/28/2021

CLINICAL DATA: Cholecystectomy for symptomatic cholelithiasis.

EXAM:
INTRAOPERATIVE CHOLANGIOGRAM
TECHNIQUE: Cholangiographic images from the C-arm fluoroscopic device were
submitted for interpretation post-operatively. Please see the
procedural report for the amount of contrast and the fluoroscopy
time utilized.
FLUOROSCOPY:
Radiation Exposure Index (as provided by the fluoroscopic device):
11.2 mGy Kerma

[Series 1: run · 4 of 155 frames shown]
[frame 18/155]
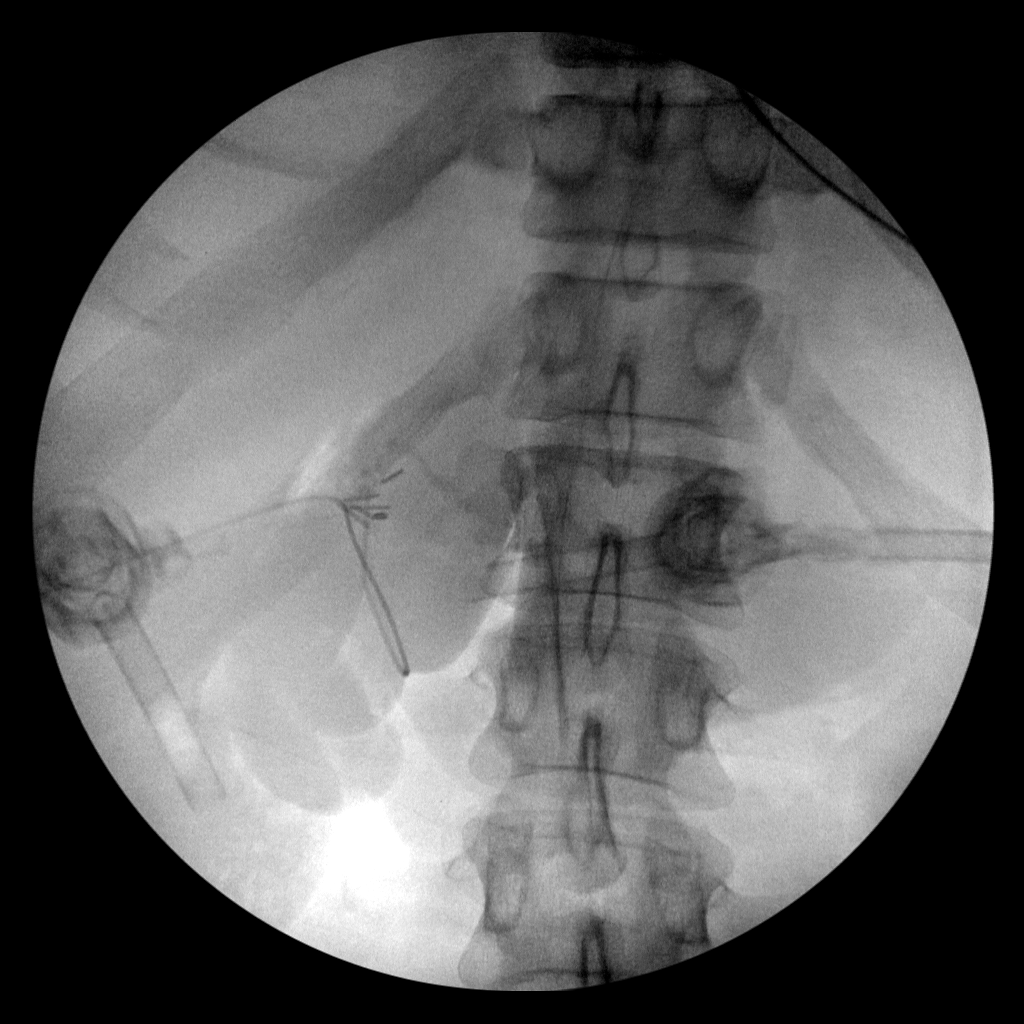
[frame 24/155]
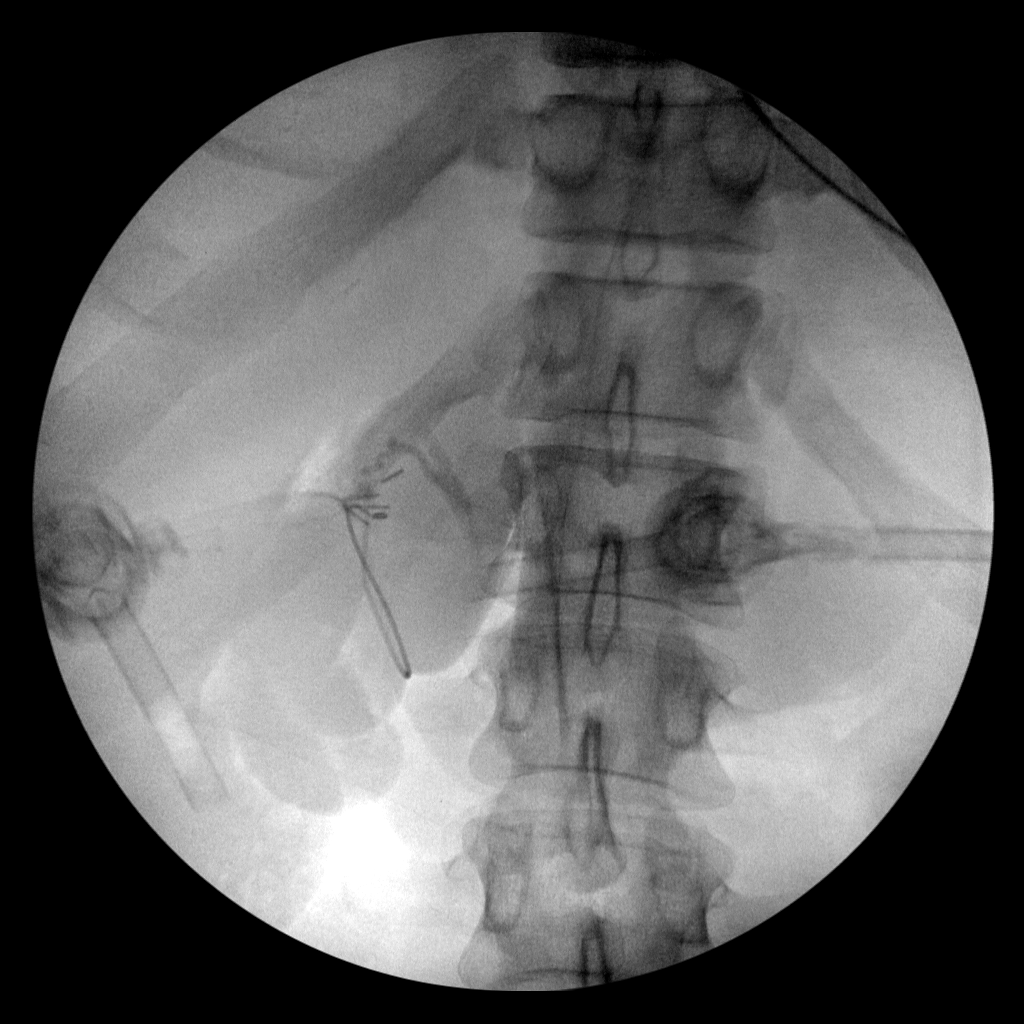
[frame 78/155]
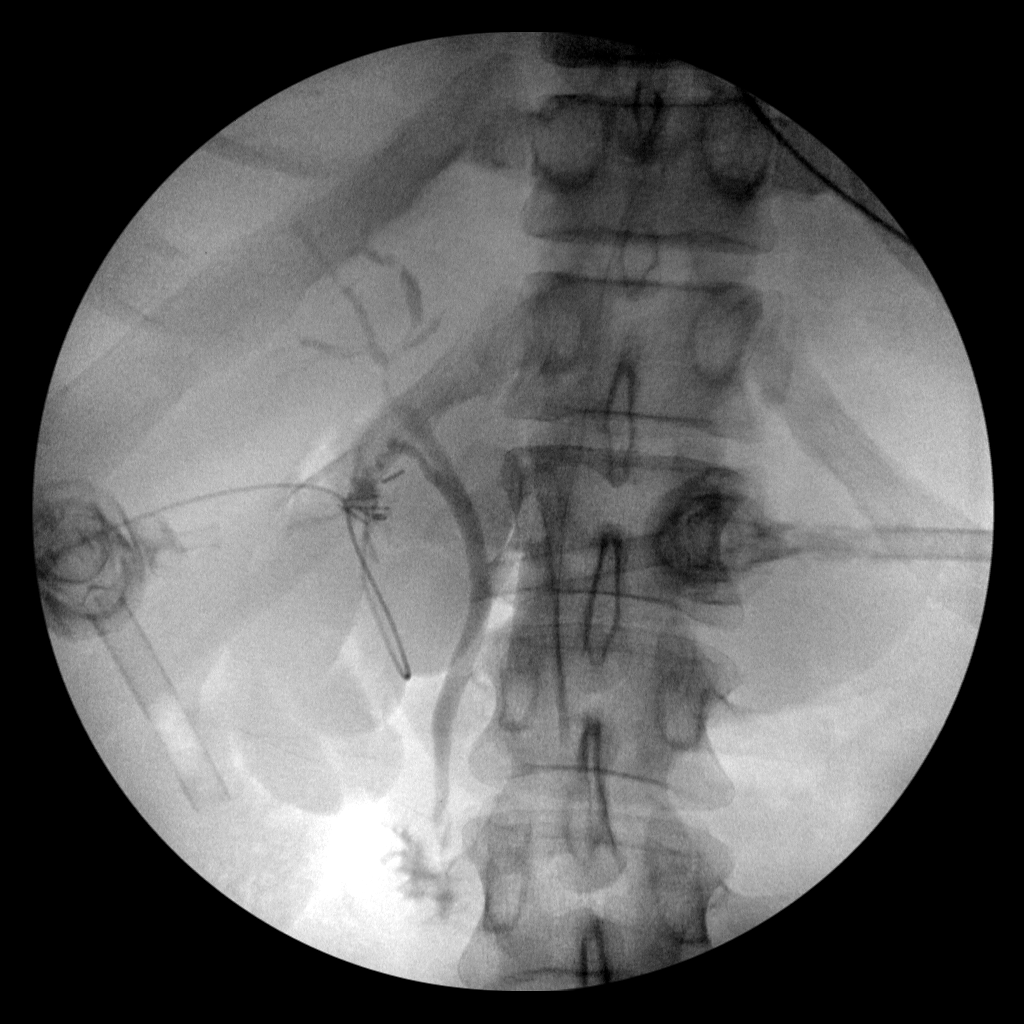
[frame 132/155]
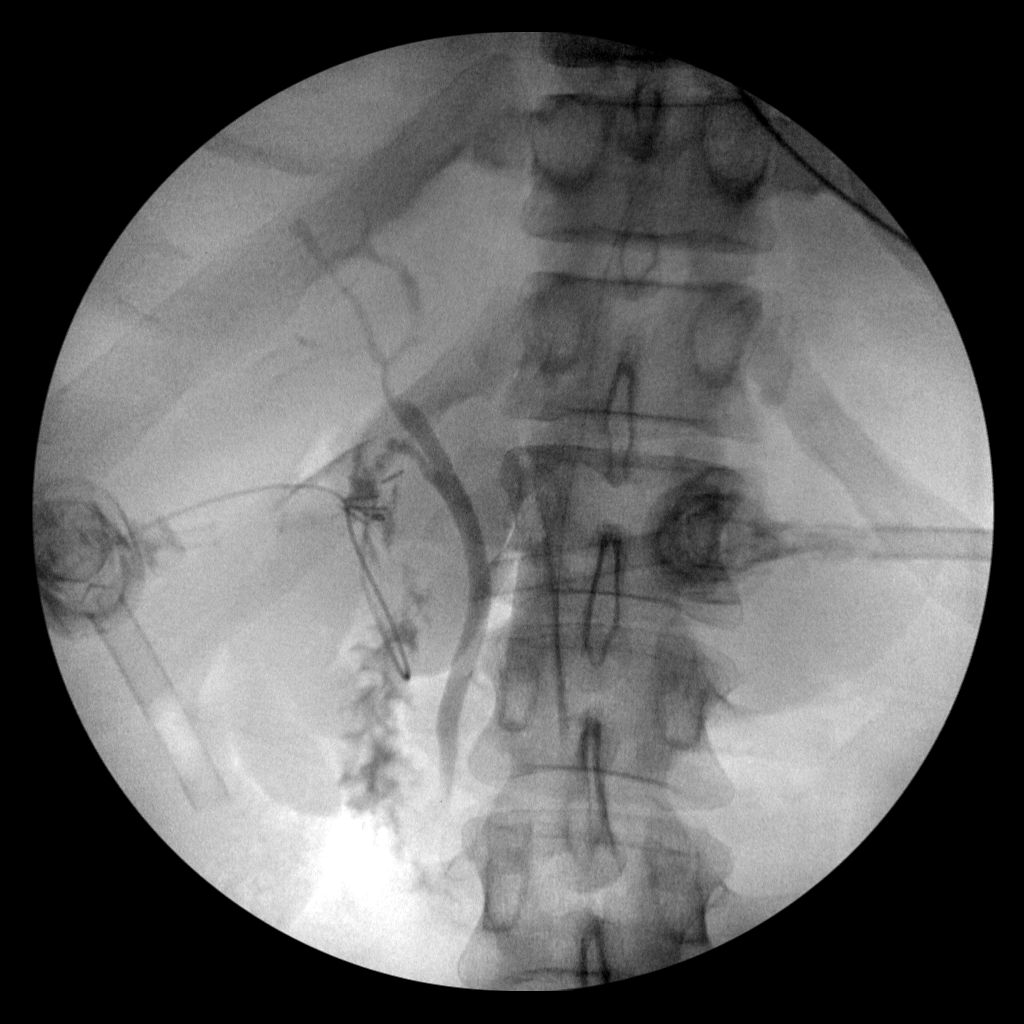

[4 of 4 positions shown; findings below may reference images not displayed]

FINDINGS: Intraoperative imaging with a C-arm demonstrates normal opacified
cystic duct stump, common bile duct and visualized intrahepatic
ducts. No evidence of biliary dilatation, stricture, filling defect
or contrast extravasation. Contrast enters the duodenum normally.
IMPRESSION: Normal intraoperative cholangiogram.

## 2024-05-01 NOTE — Telephone Encounter (Signed)
 Received lab report -  Rapid Plasma Reagin, Quant 1:2 High    T Pallidum Abs Reactiv   Collected 04/29/24.

## 2024-05-05 ENCOUNTER — Encounter: Payer: Self-pay | Admitting: Hematology and Oncology

## 2024-05-13 ENCOUNTER — Encounter: Admitting: Infectious Diseases

## 2024-05-20 ENCOUNTER — Other Ambulatory Visit (HOSPITAL_COMMUNITY): Payer: Self-pay

## 2024-05-20 ENCOUNTER — Ambulatory Visit: Admitting: Infectious Diseases

## 2024-05-20 ENCOUNTER — Other Ambulatory Visit: Payer: Self-pay

## 2024-05-20 ENCOUNTER — Encounter: Payer: Self-pay | Admitting: Infectious Diseases

## 2024-05-20 ENCOUNTER — Telehealth: Payer: Self-pay

## 2024-05-20 VITALS — BP 122/95 | HR 103 | Temp 98.1°F | Ht 76.0 in | Wt 244.8 lb

## 2024-05-20 DIAGNOSIS — J309 Allergic rhinitis, unspecified: Secondary | ICD-10-CM

## 2024-05-20 DIAGNOSIS — Z79899 Other long term (current) drug therapy: Secondary | ICD-10-CM

## 2024-05-20 DIAGNOSIS — H906 Mixed conductive and sensorineural hearing loss, bilateral: Secondary | ICD-10-CM

## 2024-05-20 DIAGNOSIS — N1831 Chronic kidney disease, stage 3a: Secondary | ICD-10-CM

## 2024-05-20 DIAGNOSIS — I129 Hypertensive chronic kidney disease with stage 1 through stage 4 chronic kidney disease, or unspecified chronic kidney disease: Secondary | ICD-10-CM | POA: Diagnosis not present

## 2024-05-20 DIAGNOSIS — B20 Human immunodeficiency virus [HIV] disease: Secondary | ICD-10-CM

## 2024-05-20 DIAGNOSIS — H6993 Unspecified Eustachian tube disorder, bilateral: Secondary | ICD-10-CM | POA: Diagnosis not present

## 2024-05-20 DIAGNOSIS — Z8249 Family history of ischemic heart disease and other diseases of the circulatory system: Secondary | ICD-10-CM

## 2024-05-20 DIAGNOSIS — Z113 Encounter for screening for infections with a predominantly sexual mode of transmission: Secondary | ICD-10-CM

## 2024-05-20 DIAGNOSIS — Z7985 Long-term (current) use of injectable non-insulin antidiabetic drugs: Secondary | ICD-10-CM

## 2024-05-20 DIAGNOSIS — Z8572 Personal history of non-Hodgkin lymphomas: Secondary | ICD-10-CM

## 2024-05-20 DIAGNOSIS — N529 Male erectile dysfunction, unspecified: Secondary | ICD-10-CM

## 2024-05-20 DIAGNOSIS — I10 Essential (primary) hypertension: Secondary | ICD-10-CM

## 2024-05-20 DIAGNOSIS — C8118 Nodular sclerosis classical Hodgkin lymphoma, lymph nodes of multiple sites: Secondary | ICD-10-CM

## 2024-05-20 DIAGNOSIS — Z21 Asymptomatic human immunodeficiency virus [HIV] infection status: Secondary | ICD-10-CM

## 2024-05-20 DIAGNOSIS — Z8619 Personal history of other infectious and parasitic diseases: Secondary | ICD-10-CM

## 2024-05-20 MED ORDER — TADALAFIL 5 MG PO TABS
5.0000 mg | ORAL_TABLET | Freq: Every day | ORAL | 1 refills | Status: AC | PRN
Start: 2024-05-20 — End: ?

## 2024-05-20 MED ORDER — OZEMPIC (0.25 OR 0.5 MG/DOSE) 2 MG/1.5ML ~~LOC~~ SOPN: 0.5000 mg | PEN_INJECTOR | SUBCUTANEOUS | Status: DC

## 2024-05-20 NOTE — Assessment & Plan Note (Signed)
 Mild increase in diastolic Appreciate pcp f/u

## 2024-05-20 NOTE — Assessment & Plan Note (Addendum)
 He is doing well No change to biktarvy . Mentioned cabaneuva. Will run insurance Doesn't want vax Will see him back in 9 months.

## 2024-05-20 NOTE — Progress Notes (Signed)
 Subjective:    Patient ID: Lee [name], male  DOB: 07-26-1980, 43 y.o.        MRN: [id number]   HPI 43 yo M with hx of HTN and HIV+ dx 08-03-16.  He had first visit 09-07-16 and was also found to have syphilis. He received IM PEN G on 09-14-16.  His genotype was naive, HLA negative.  Has been on triumeq but at 12-21-16 he had increased LFTs and Cr on labs. He had repeat labs done on 6-8 which confirmed this. He was seen in f/u with pharm 02-2017 and was changed to biktarvy .  He had emergency cholecystectomy 12-2021. 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  He completed his CTX 03-2023 with a PET at that time that showed no residual disease.    (04-14-2024) CT: 1. Decreased size of the right axillary/subpectoral lymph nodes. No evidence of new or progressive lymphadenopathy in the chest, abdomen or pelvis. No splenomegaly. 2. New 6 mm right upper lobe ground-glass pulmonary nodule, nonspecific but favored to reflect an infectious or inflammatory etiology. Suggest attention on follow-up imaging. 3.  Aortic Atherosclerosis (ICD10-I70.0).  was seen in f/u by Dr Federico and discussed.  Feels like his memory is worse after CTX.  Libido is decreased as well. Not sexually active.   Doing well with his biktarvy .  Staying busy with his work.  Has not does not want flu or COVID vax this fall.   Is on ozempeic from his PCP.  Has lost wt and aiming for 222.  Has plan to keep wt off after his insurance stops covering December   HIV 1 RNA Quant  Date Value  02/24/2022 Not Detected Copies/mL  05/26/2021 38 Copies/mL (H)  12/05/2019 35 copies/mL (H)   HIV-1 RNA Viral Load (copies/mL)  Date Value  04/29/2024 <20  10/30/2023 670   CD4 T Cell Abs (/uL)  Date Value  04/29/2024 944  10/30/2023 972  05/26/2021 525     Health Maintenance  Topic Date Due  . HPV VACCINES (1 - Risk 3-dose SCDM series) Never done   . Influenza Vaccine  02/22/2024  . COVID-19 Vaccine (6 - 2025-26 season) 03/24/2024  . DTaP/Tdap/Td (8 - Td or Tdap) 02/19/2029  . Pneumococcal Vaccine (4 of 4 - PCV20 or PCV21) 02/11/2033  . Hepatitis B Vaccines 19-59 Average Risk  Completed  . Hepatitis C Screening  Completed  . HIV Screening  Completed  . Meningococcal B Vaccine  Aged Out    Review of Systems  Constitutional:  Positive for weight loss (44# wt loss, intentional). Negative for chills and fever.  Respiratory:  Negative for cough and shortness of breath.   Gastrointestinal:  Negative for constipation and diarrhea.  Genitourinary:  Negative for dysuria.  Sees ENT for hearing, has sinus d/c. Same d/c coming from his ears. No surgery pending. Has f/u in the next month. Has allergist [place]).  Please see HPI. All other systems reviewed and negative.     Objective:  Physical Exam Vitals reviewed.  Constitutional:      Appearance: Normal appearance.  HENT:     Mouth/Throat:     Mouth: Mucous membranes are moist.     Pharynx: No oropharyngeal exudate.  Eyes:     Extraocular Movements: Extraocular movements intact.     Pupils: Pupils are equal, round, and reactive to light.  Cardiovascular:     Rate and Rhythm: Normal rate and regular  rhythm.  Pulmonary:     Effort: Pulmonary effort is normal.     Breath sounds: Normal breath sounds.  Abdominal:     General: Abdomen is flat. There is no distension.     Tenderness: There is no abdominal tenderness.  Musculoskeletal:        General: Normal range of motion.     Cervical back: Normal range of motion and neck supple.     Right lower leg: No edema.     Left lower leg: No edema.  Neurological:     General: No focal deficit present.     Mental Status: He is alert.            Assessment & Plan:

## 2024-05-20 NOTE — Assessment & Plan Note (Signed)
 F/u with Dr Federico Jan 2026

## 2024-05-20 NOTE — Assessment & Plan Note (Signed)
Remains stable

## 2024-05-20 NOTE — Telephone Encounter (Signed)
 Per test claim:   Prior Auth required.  ROUTE OF ADMINISTRATION NOT COVERED. PRODUCT OR SERVICE NOT COVERED.    Will attempt PA

## 2024-05-20 NOTE — Assessment & Plan Note (Signed)
 Considering hearing aids after ENT f/u next month.

## 2024-05-20 NOTE — Assessment & Plan Note (Signed)
 Appreciate ENT and allergy f/u.

## 2024-05-20 NOTE — Addendum Note (Signed)
 Addended by: Aizik Reh C on: 05/20/2024 02:41 PM   Modules accepted: Orders

## 2024-05-20 NOTE — Telephone Encounter (Signed)
 Prior authorization submitted for CABENUVA to [employer] via [employer].   Key: [id number]

## 2024-05-23 NOTE — Telephone Encounter (Signed)
 Pharmacy Patient Advocate Encounter  Received notification from [facility] that Prior Authorization for Cabenuva 600 & 900MG /3ML er suspension has been CANCELLED due to  No Authorization Required. Please note this medication is not covered under the pharmacy benefit and does not require prior authorization under the medical benefit. Your request is being cancelled.     This medication can be billed under their medical benefit. They will need to receive this at the infusion center. Will route to appropriate team.  PA #/Case ID/Reference #: [id number]

## 2024-05-30 ENCOUNTER — Telehealth (HOSPITAL_COMMUNITY): Payer: Self-pay

## 2024-05-30 ENCOUNTER — Other Ambulatory Visit (HOSPITAL_COMMUNITY): Payer: Self-pay | Admitting: Pharmacy Technician

## 2024-05-30 DIAGNOSIS — B20 Human immunodeficiency virus [HIV] disease: Secondary | ICD-10-CM | POA: Insufficient documentation

## 2024-05-30 NOTE — Telephone Encounter (Signed)
 Auth Submission: NO AUTH NEEDED Site of care: Site of care: [facility] Payer: [employer] [place] Medication & CPT/J Code(s) submitted: [place] ([id number]) Diagnosis Code: B20, Z21 Route of submission (phone, fax, portal): phone Phone #[phone] Fax # Auth type: Buy/Bill HB Units/visits requested: 600/900mg  q61month x 2 doses, then 600/900mg  q56months Reference number: [id number] Approval from: 05/30/24 to 07/23/24

## 2024-06-04 ENCOUNTER — Other Ambulatory Visit: Payer: Self-pay

## 2024-06-04 ENCOUNTER — Emergency Department (HOSPITAL_BASED_OUTPATIENT_CLINIC_OR_DEPARTMENT_OTHER)

## 2024-06-04 ENCOUNTER — Emergency Department (HOSPITAL_BASED_OUTPATIENT_CLINIC_OR_DEPARTMENT_OTHER)
Admission: EM | Admit: 2024-06-04 | Discharge: 2024-06-05 | Disposition: A | Discharge status: Home / Self Care | Source: Home / Self Care

## 2024-06-04 ENCOUNTER — Encounter (HOSPITAL_BASED_OUTPATIENT_CLINIC_OR_DEPARTMENT_OTHER): Payer: Self-pay

## 2024-06-04 DIAGNOSIS — R0602 Shortness of breath: Secondary | ICD-10-CM | POA: Insufficient documentation

## 2024-06-04 DIAGNOSIS — T7840XD Allergy, unspecified, subsequent encounter: Secondary | ICD-10-CM

## 2024-06-04 DIAGNOSIS — J4541 Moderate persistent asthma with (acute) exacerbation: Secondary | ICD-10-CM | POA: Diagnosis not present

## 2024-06-04 LAB — BASIC METABOLIC PANEL WITH GFR
Anion gap: 11 (ref 5–15)
BUN: 12 mg/dL (ref 6–20)
CO2: 24 mmol/L (ref 22–32)
Calcium: 9 mg/dL (ref 8.9–10.3)
Chloride: 105 mmol/L (ref 98–111)
Creatinine, Ser: 1.33 mg/dL — ABNORMAL HIGH (ref 0.61–1.24)
GFR, Estimated: >60 mL/min (ref >60)
Glucose, Bld: 85 mg/dL (ref 70–99)
Potassium: 3.5 mmol/L (ref 3.5–5.1)
Sodium: 141 mmol/L (ref 135–145)

## 2024-06-04 LAB — CBC
HCT: 46.4 % (ref 39.0–52.0)
Hemoglobin: 16.7 g/dL (ref 13.0–17.0)
MCH: 32.5 pg (ref 26.0–34.0)
MCHC: 36 g/dL (ref 30.0–36.0)
MCV: 90.3 fL (ref 80.0–100.0)
Platelets: 219 K/uL (ref 150–400)
RBC: 5.14 MIL/uL (ref 4.22–5.81)
RDW: 13 % (ref 11.5–15.5)
WBC: 7.5 K/uL (ref 4.0–10.5)
nRBC: 0 % (ref 0.0–0.2)

## 2024-06-04 MED ORDER — IPRATROPIUM-ALBUTEROL 0.5-2.5 (3) MG/3ML IN SOLN
3.0000 mL | RESPIRATORY_TRACT | Status: DC | PRN
  Administered 2024-06-04: 3 mL via RESPIRATORY_TRACT
  Filled 2024-06-04 (×2): qty 3

## 2024-06-04 MED ORDER — IPRATROPIUM-ALBUTEROL 0.5-2.5 (3) MG/3ML IN SOLN
RESPIRATORY_TRACT | Status: AC
  Administered 2024-06-04: 3 mL via RESPIRATORY_TRACT
  Filled 2024-06-04: qty 3

## 2024-06-04 MED ORDER — ALBUTEROL (5 MG/ML) CONTINUOUS INHALATION SOLN
10.0000 mg/h | INHALATION_SOLUTION | Freq: Once | RESPIRATORY_TRACT | Status: AC
  Administered 2024-06-04: 10 mg/h via RESPIRATORY_TRACT

## 2024-06-04 MED ORDER — PREDNISONE 50 MG PO TABS: 50.0000 mg | ORAL_TABLET | Freq: Every day | ORAL | 0 refills | Status: DC

## 2024-06-04 MED ORDER — PREDNISONE 50 MG PO TABS
60.0000 mg | ORAL_TABLET | Freq: Once | ORAL | Status: AC
  Administered 2024-06-04: 60 mg via ORAL
  Filled 2024-06-04: qty 1

## 2024-06-04 NOTE — ED Triage Notes (Signed)
 SHOB w exertion since this morning.  Increased WOB while talking and walking   Denies chest pain, N/V, cough, fevers  RRT in triage

## 2024-06-04 NOTE — ED Notes (Signed)
 Report received from [place], [place]. Assuming pt care at this time.

## 2024-06-04 NOTE — ED Notes (Signed)
 Dr. Maple Hudson at bedside

## 2024-06-04 NOTE — ED Notes (Signed)
 Per [place]  RRT, pt's ambulatory SpO2: 88%

## 2024-06-04 NOTE — ED Provider Notes (Signed)
 [place] EMERGENCY DEPARTMENT AT [facility] Provider Note   CSN: [id number] Arrival date & time: 06/04/24  1813     Patient presents with: Shortness of Breath   Lee [name] is a 43 y.o. male.   43 year old male presenting emergency department shortness of breath.  Reports some minor congestion rhinorrhea today.  Worsening dyspnea on exertion and talking.  Wheezing.  No chest pain nausea vomiting, fevers chills or cough.  Tried his home inhaler with little improvement.   Shortness of Breath      Prior to Admission medications   Medication Sig Start Date End Date Taking? Authorizing Provider  predniSONE  (DELTASONE ) 50 MG tablet Take 1 tablet (50 mg total) by mouth daily with breakfast for 3 days. 06/05/24 06/08/24 Yes Tj Kitchings, Caron PARAS, DO  acetaminophen  (TYLENOL ) 650 MG CR tablet Take 650 mg by mouth every 8 (eight) hours as needed for pain.    [provider]  albuterol  (VENTOLIN  HFA) 108 (90 Base) MCG/ACT inhaler Inhale 2 puffs into the lungs every 6 (six) hours as needed for wheezing or shortness of breath. 11/07/23   Eben Reyes BROCKS, MD  amLODipine  (NORVASC ) 10 MG tablet Take 1 tablet by mouth once daily 03/25/24   Eben Reyes BROCKS, MD  azelastine  (ASTELIN ) 0.1 % nasal spray 1-2 puffs in each nostril twice daily    Frutoso Luz, MD  azelastine  (OPTIVAR ) 0.05 % ophthalmic solution Apply to eye. 01/17/22   [provider]  [name]  50-200-25 MG TABS tablet TAKE 1 TABLET DAILY 03/04/24   Eben Reyes BROCKS, MD  BREO ELLIPTA  200-25 MCG/ACT AEPB Inhale 1 puff by mouth once daily 02/28/24   Eben Reyes BROCKS, MD  EPINEPHrine  0.3 mg/0.3 mL IJ SOAJ injection Inject 0.3 mg into the muscle once.    Frutoso Luz, MD  loratadine  (CLARITIN ) 10 MG tablet Take 10 mg by mouth daily.    [provider]  montelukast  (SINGULAIR ) 10 MG tablet Take 10 mg by mouth at bedtime. 12/01/21   [provider]  mupirocin  ointment (BACTROBAN ) 2 % Apply  1 Application topically 2 (two) times daily. Apply topical to the affected area twice a day until clear 12/20/22   Alm Delon SAILOR, DO  ondansetron  (ZOFRAN -ODT) 8 MG disintegrating tablet Take 1 tablet (8 mg total) by mouth every 8 (eight) hours as needed for nausea or vomiting. 10/23/22   Federico Norleen ONEIDA MADISON, MD  prochlorperazine  (COMPAZINE ) 10 MG tablet Take 1 tablet (10 mg total) by mouth every 6 (six) hours as needed for nausea or vomiting. 10/08/22   Federico Norleen ONEIDA MADISON, MD  Semaglutide,0.25 or 0.5MG /DOS, (OZEMPIC, 0.25 OR 0.5 MG/DOSE,) 2 MG/1.5ML SOPN Inject 0.5 mg into the skin once a week. 05/20/24   Eben Reyes BROCKS, MD  tadalafil (CIALIS) 5 MG tablet Take 1 tablet (5 mg total) by mouth daily as needed for erectile dysfunction. 05/20/24   Eben Reyes BROCKS, MD  Vitamin D, Ergocalciferol, (DRISDOL) 1.25 MG (50000 UNIT) CAPS capsule Take 50,000 Units by mouth once a week. 06/06/23   [provider]    Allergies: Gabapentin , Lisinopril , and Nickel    Review of Systems  Respiratory:  Positive for shortness of breath.     Updated Vital Signs BP (!) 129/101 (BP Location: Left Arm)   Pulse (!) 118   Temp 98.2 F (36.8 C) (Oral)   Resp 15   Ht 6' 4 (1.93 m)   Wt 111 kg   SpO2 96%   BMI 29.79 kg/m  Physical Exam Vitals and nursing note reviewed.  Constitutional:      General: He is not in acute distress.    Appearance: He is not toxic-appearing.  Cardiovascular:     Rate and Rhythm: Normal rate and regular rhythm.  Pulmonary:     Effort: Pulmonary effort is normal.     Breath sounds: Wheezing present. No decreased breath sounds, rhonchi or rales.  Musculoskeletal:     Cervical back: Normal range of motion.     Right lower leg: No edema.     Left lower leg: No edema.  Skin:    General: Skin is warm.     Capillary Refill: Capillary refill takes less than 2 seconds.  Neurological:     General: No focal deficit present.     Mental Status: He is alert.  Psychiatric:         Mood and Affect: Mood normal.        Behavior: Behavior normal.     (all labs ordered are listed, but only abnormal results are displayed) Labs Reviewed  BASIC METABOLIC PANEL WITH GFR - Abnormal; Notable for the following components:      Result Value   Creatinine, Ser 1.33 (*)    All other components within normal limits  CBC    EKG: None  Radiology: DG Chest 2 View Result Date: 06/04/2024 CLINICAL DATA:  Shortness of breath EXAM: CHEST - 2 VIEW COMPARISON:  Chest x-ray 12/28/2021, CT chest abdomen and pelvis 04/14/2024. FINDINGS: There central peribronchial wall thickening bilaterally. There is mild prominence of the bilateral hilar regions likely related to adenopathy seen on prior study. There is no focal lung infiltrate, pleural effusion or pneumothorax. The cardiac silhouette is within normal limits. The osseous structures appear normal. IMPRESSION: 1. Central peribronchial wall thickening bilaterally, which can be seen in the setting of bronchitis or reactive airway disease. 2. Mild prominence of the bilateral hilar regions likely related to adenopathy seen on prior study. Electronically Signed   By: Greig Pique M.D.   On: 06/04/2024 18:55     Procedures   Medications Ordered in the ED  ipratropium-albuterol  (DUONEB) 0.5-2.5 (3) MG/3ML nebulizer solution 3 mL (3 mLs Nebulization Given 06/04/24 2119)  predniSONE  (DELTASONE ) tablet 60 mg (60 mg Oral Given 06/04/24 2011)  albuterol  (PROVENTIL ,VENTOLIN ) solution continuous neb (10 mg/hr Nebulization Given 06/04/24 2245)    Clinical Course as of 06/04/24 2326  Wed Jun 04, 2024  2000 DG Chest 2 View MPRESSION: 1. Central peribronchial wall thickening bilaterally, which can be seen in the setting of bronchitis or reactive airway disease. 2. Mild prominence of the bilateral hilar regions likely related to adenopathy seen on prior study.   Electronically Signed   By: Greig Pique M.D.   [TY]  2220 Patient feels  improved.  Wheezing is greatly diminished.  Will ambulate.  If he does not desaturate will discharge in stable condition.  Elevated heart rate likely secondary to albuterol  use. [TY]  2236 Patient desaturated to 88% with ambulation walking around the ED.  Had some tachypnea and worsening shortness of breath towards the end of the walk.  He has recovered sitting in bed.  Will give another breathing treatments and reassess.  As patient clinically shown clinical improvement and wheezing has greatly improved. He does not want to be admitted.  [TY]  2326 Care signed out to overnight team.  Dispo pending reevaluation after breathing treatment and ambulatory trial. [TY]    Clinical Course User Index [TY] Neysa,  Caron PARAS, DO                                 Medical Decision Making Is a 43 year old male presenting emergency department with shortness of breath.  Afebrile, slightly tachypneic, but maintaining oxygen saturation on room air.  Does not appear to be in overt respiratory distress.  However does have some wheezing in lower lungs.  Chest x-ray without pneumonia or pneumothorax my depend review.  Radiologist reading with peribronchial thickening likely related to bronchitis/reactive airway.  He has no metabolic derangement.  No anemia on his CBC.  Does see [place] allergy/asthma and is on several asthma medications.  Treated with DuoNeb, prednisone .  Plan to reevaluate patient. See ED course for further MDM/dispo.   Amount and/or Complexity of Data Reviewed External Data Reviewed:     Details: Per chart review I did see that he follows with [place] allergy/asthma and is on several medications for asthma Labs: ordered. Decision-making details documented in ED Course. Radiology: ordered and independent interpretation performed. Decision-making details documented in ED Course.    Details: See above ECG/medicine tests: ordered.  Risk Prescription drug management. Decision regarding  hospitalization. Diagnosis or treatment significantly limited by social determinants of health. Risk Details: Poor health literacy       Final diagnoses:  SOB (shortness of breath)    ED Discharge Orders          Ordered    predniSONE  (DELTASONE ) 50 MG tablet  Daily with breakfast        06/04/24 2144               Neysa Caron PARAS, DO 06/04/24 2326

## 2024-06-05 ENCOUNTER — Inpatient Hospital Stay (HOSPITAL_COMMUNITY)
Admission: EM | Admit: 2024-06-05 | Discharge: 2024-06-08 | DRG: 203 | Disposition: A | Discharge status: Home / Self Care | Attending: Internal Medicine | Admitting: Internal Medicine

## 2024-06-05 ENCOUNTER — Encounter (HOSPITAL_COMMUNITY): Payer: Self-pay

## 2024-06-05 ENCOUNTER — Emergency Department (HOSPITAL_COMMUNITY)

## 2024-06-05 ENCOUNTER — Other Ambulatory Visit: Payer: Self-pay

## 2024-06-05 DIAGNOSIS — T7840XD Allergy, unspecified, subsequent encounter: Secondary | ICD-10-CM

## 2024-06-05 DIAGNOSIS — Z8249 Family history of ischemic heart disease and other diseases of the circulatory system: Secondary | ICD-10-CM

## 2024-06-05 DIAGNOSIS — Z8571 Personal history of Hodgkin lymphoma: Secondary | ICD-10-CM

## 2024-06-05 DIAGNOSIS — H905 Unspecified sensorineural hearing loss: Secondary | ICD-10-CM | POA: Diagnosis present

## 2024-06-05 DIAGNOSIS — J4541 Moderate persistent asthma with (acute) exacerbation: Principal | ICD-10-CM | POA: Diagnosis present

## 2024-06-05 DIAGNOSIS — J189 Pneumonia, unspecified organism: Secondary | ICD-10-CM

## 2024-06-05 DIAGNOSIS — Z21 Asymptomatic human immunodeficiency virus [HIV] infection status: Secondary | ICD-10-CM | POA: Diagnosis present

## 2024-06-05 DIAGNOSIS — Z9221 Personal history of antineoplastic chemotherapy: Secondary | ICD-10-CM

## 2024-06-05 DIAGNOSIS — J45901 Unspecified asthma with (acute) exacerbation: Secondary | ICD-10-CM | POA: Diagnosis not present

## 2024-06-05 DIAGNOSIS — R0902 Hypoxemia: Secondary | ICD-10-CM | POA: Diagnosis present

## 2024-06-05 DIAGNOSIS — Z7985 Long-term (current) use of injectable non-insulin antidiabetic drugs: Secondary | ICD-10-CM

## 2024-06-05 DIAGNOSIS — Z9049 Acquired absence of other specified parts of digestive tract: Secondary | ICD-10-CM

## 2024-06-05 DIAGNOSIS — Z7952 Long term (current) use of systemic steroids: Secondary | ICD-10-CM

## 2024-06-05 DIAGNOSIS — Z91048 Other nonmedicinal substance allergy status: Secondary | ICD-10-CM

## 2024-06-05 DIAGNOSIS — R000 Tachycardia, unspecified: Secondary | ICD-10-CM | POA: Diagnosis not present

## 2024-06-05 DIAGNOSIS — Z79899 Other long term (current) drug therapy: Secondary | ICD-10-CM

## 2024-06-05 DIAGNOSIS — Z888 Allergy status to other drugs, medicaments and biological substances status: Secondary | ICD-10-CM

## 2024-06-05 DIAGNOSIS — N183 Chronic kidney disease, stage 3 unspecified: Secondary | ICD-10-CM | POA: Diagnosis present

## 2024-06-05 DIAGNOSIS — B9789 Other viral agents as the cause of diseases classified elsewhere: Secondary | ICD-10-CM | POA: Diagnosis present

## 2024-06-05 DIAGNOSIS — I129 Hypertensive chronic kidney disease with stage 1 through stage 4 chronic kidney disease, or unspecified chronic kidney disease: Secondary | ICD-10-CM | POA: Diagnosis present

## 2024-06-05 DIAGNOSIS — T50995A Adverse effect of other drugs, medicaments and biological substances, initial encounter: Secondary | ICD-10-CM | POA: Diagnosis not present

## 2024-06-05 DIAGNOSIS — Z7951 Long term (current) use of inhaled steroids: Secondary | ICD-10-CM

## 2024-06-05 LAB — CBC WITH DIFFERENTIAL/PLATELET
Abs Immature Granulocytes: 0.03 K/uL (ref 0.00–0.07)
Basophils Absolute: 0 K/uL (ref 0.0–0.1)
Basophils Relative: 0 %
Eosinophils Absolute: 0 K/uL (ref 0.0–0.5)
Eosinophils Relative: 0 %
HCT: 47.1 % (ref 39.0–52.0)
Hemoglobin: 16.9 g/dL (ref 13.0–17.0)
Immature Granulocytes: 0 %
Lymphocytes Relative: 14 %
Lymphs Abs: 1 K/uL (ref 0.7–4.0)
MCH: 32.1 pg (ref 26.0–34.0)
MCHC: 35.9 g/dL (ref 30.0–36.0)
MCV: 89.5 fL (ref 80.0–100.0)
Monocytes Absolute: 0.2 K/uL (ref 0.1–1.0)
Monocytes Relative: 2 %
Neutro Abs: 6.3 K/uL (ref 1.7–7.7)
Neutrophils Relative %: 84 %
Platelets: 238 K/uL (ref 150–400)
RBC: 5.26 MIL/uL (ref 4.22–5.81)
RDW: 13.1 % (ref 11.5–15.5)
WBC: 7.5 K/uL (ref 4.0–10.5)
nRBC: 0 % (ref 0.0–0.2)

## 2024-06-05 LAB — BASIC METABOLIC PANEL WITH GFR
Anion gap: 12 (ref 5–15)
BUN: 14 mg/dL (ref 6–20)
CO2: 22 mmol/L (ref 22–32)
Calcium: 9.4 mg/dL (ref 8.9–10.3)
Chloride: 105 mmol/L (ref 98–111)
Creatinine, Ser: 1.18 mg/dL (ref 0.61–1.24)
GFR, Estimated: >60 mL/min (ref >60)
Glucose, Bld: 99 mg/dL (ref 70–99)
Potassium: 3.9 mmol/L (ref 3.5–5.1)
Sodium: 139 mmol/L (ref 135–145)

## 2024-06-05 LAB — RESP PANEL BY RT-PCR (RSV, FLU A&B, COVID)  RVPGX2
Influenza A by PCR: NEGATIVE
Influenza B by PCR: NEGATIVE
Resp Syncytial Virus by PCR: NEGATIVE
SARS Coronavirus 2 by RT PCR: NEGATIVE

## 2024-06-05 MED ORDER — FLUTICASONE PROPIONATE 50 MCG/ACT NA SUSP: 1.0000 | Freq: Two times a day (BID) | NASAL | Status: DC | PRN

## 2024-06-05 MED ORDER — ACETAMINOPHEN 325 MG PO TABS: 650.0000 mg | ORAL_TABLET | Freq: Four times a day (QID) | ORAL | Status: DC | PRN

## 2024-06-05 MED ORDER — MONTELUKAST SODIUM 10 MG PO TABS
10.0000 mg | ORAL_TABLET | Freq: Every day | ORAL | Status: DC
  Administered 2024-06-05 – 2024-06-07 (×3): 10 mg via ORAL
  Filled 2024-06-05 (×3): qty 1

## 2024-06-05 MED ORDER — HYDRALAZINE HCL 25 MG PO TABS: 25.0000 mg | ORAL_TABLET | Freq: Four times a day (QID) | ORAL | Status: DC | PRN

## 2024-06-05 MED ORDER — IPRATROPIUM BROMIDE 0.06 % NA SOLN: 2.0000 | Freq: Four times a day (QID) | NASAL | Status: DC | PRN

## 2024-06-05 MED ORDER — AZITHROMYCIN 250 MG PO TABS
500.0000 mg | ORAL_TABLET | Freq: Once | ORAL | Status: AC
  Administered 2024-06-05: 500 mg via ORAL
  Filled 2024-06-05: qty 2

## 2024-06-05 MED ORDER — SODIUM CHLORIDE 0.9 % IV SOLN
1.0000 g | Freq: Once | INTRAVENOUS | Status: AC
  Administered 2024-06-05: 1 g via INTRAVENOUS
  Filled 2024-06-05: qty 10

## 2024-06-05 MED ORDER — GUAIFENESIN ER 600 MG PO TB12
600.0000 mg | ORAL_TABLET | Freq: Two times a day (BID) | ORAL | Status: DC
  Administered 2024-06-05 – 2024-06-08 (×6): 600 mg via ORAL
  Filled 2024-06-05 (×6): qty 1

## 2024-06-05 MED ORDER — ALBUTEROL SULFATE HFA 108 (90 BASE) MCG/ACT IN AERS: 2.0000 | INHALATION_SPRAY | Freq: Four times a day (QID) | RESPIRATORY_TRACT | 2 refills | Status: DC | PRN

## 2024-06-05 MED ORDER — ENOXAPARIN SODIUM 40 MG/0.4ML IJ SOSY
40.0000 mg | PREFILLED_SYRINGE | INTRAMUSCULAR | Status: DC
  Administered 2024-06-05 – 2024-06-07 (×3): 40 mg via SUBCUTANEOUS
  Filled 2024-06-05 (×3): qty 0.4

## 2024-06-05 MED ORDER — BUDESONIDE 0.5 MG/2ML IN SUSP
0.5000 mg | Freq: Two times a day (BID) | RESPIRATORY_TRACT | Status: DC
  Administered 2024-06-05 – 2024-06-08 (×6): 0.5 mg via RESPIRATORY_TRACT
  Filled 2024-06-05 (×6): qty 2

## 2024-06-05 MED ORDER — PREDNISONE 20 MG PO TABS
60.0000 mg | ORAL_TABLET | Freq: Once | ORAL | Status: AC
  Administered 2024-06-05: 60 mg via ORAL
  Filled 2024-06-05: qty 3

## 2024-06-05 MED ORDER — SODIUM CHLORIDE 0.9 % IV BOLUS
1000.0000 mL | Freq: Once | INTRAVENOUS | Status: AC
  Administered 2024-06-05: 1000 mL via INTRAVENOUS

## 2024-06-05 MED ORDER — AMLODIPINE BESYLATE 10 MG PO TABS
10.0000 mg | ORAL_TABLET | Freq: Every day | ORAL | Status: DC
  Administered 2024-06-05 – 2024-06-07 (×3): 10 mg via ORAL
  Filled 2024-06-05: qty 1
  Filled 2024-06-05: qty 2
  Filled 2024-06-05: qty 1

## 2024-06-05 MED ORDER — ALBUTEROL SULFATE (2.5 MG/3ML) 0.083% IN NEBU
2.5000 mg | INHALATION_SOLUTION | Freq: Once | RESPIRATORY_TRACT | Status: AC
  Administered 2024-06-05: 2.5 mg via RESPIRATORY_TRACT
  Filled 2024-06-05: qty 3

## 2024-06-05 MED ORDER — PREDNISONE 50 MG PO TABS
50.0000 mg | ORAL_TABLET | Freq: Every day | ORAL | Status: DC
  Administered 2024-06-06 – 2024-06-08 (×3): 50 mg via ORAL
  Filled 2024-06-05 (×3): qty 1

## 2024-06-05 MED ORDER — ACETAMINOPHEN 650 MG RE SUPP: 650.0000 mg | Freq: Four times a day (QID) | RECTAL | Status: DC | PRN

## 2024-06-05 MED ORDER — LEVALBUTEROL HCL 0.63 MG/3ML IN NEBU
0.6300 mg | INHALATION_SOLUTION | RESPIRATORY_TRACT | Status: DC | PRN
Start: 2024-06-05 — End: 2024-06-08

## 2024-06-05 MED ORDER — MAGNESIUM SULFATE 50 % IJ SOLN: 1.0000 g | Freq: Once | INTRAMUSCULAR | Status: DC

## 2024-06-05 MED ORDER — MAGNESIUM SULFATE 2 GM/50ML IV SOLN
2.0000 g | Freq: Once | INTRAVENOUS | Status: AC
  Administered 2024-06-05: 2 g via INTRAVENOUS
  Filled 2024-06-05: qty 50

## 2024-06-05 MED ORDER — IPRATROPIUM-ALBUTEROL 0.5-2.5 (3) MG/3ML IN SOLN
3.0000 mL | Freq: Once | RESPIRATORY_TRACT | Status: AC
  Administered 2024-06-05: 3 mL via RESPIRATORY_TRACT
  Filled 2024-06-05: qty 3

## 2024-06-05 MED ORDER — GUAIFENESIN-DM 100-10 MG/5ML PO SYRP: 5.0000 mL | ORAL_SOLUTION | ORAL | Status: DC | PRN

## 2024-06-05 MED ORDER — LEVALBUTEROL HCL 0.63 MG/3ML IN NEBU
0.6300 mg | INHALATION_SOLUTION | Freq: Four times a day (QID) | RESPIRATORY_TRACT | Status: DC
  Administered 2024-06-05 – 2024-06-06 (×4): 0.63 mg via RESPIRATORY_TRACT
  Filled 2024-06-05 (×6): qty 3

## 2024-06-05 MED ORDER — BICTEGRAVIR-EMTRICITAB-TENOFOV 50-200-25 MG PO TABS
1.0000 | ORAL_TABLET | Freq: Every day | ORAL | Status: DC
  Administered 2024-06-05 – 2024-06-07 (×3): 1 via ORAL
  Filled 2024-06-05 (×3): qty 1

## 2024-06-05 MED ORDER — KETOTIFEN FUMARATE 0.035 % OP SOLN
2.0000 [drp] | Freq: Two times a day (BID) | OPHTHALMIC | Status: DC | PRN
Start: 2024-06-05 — End: 2024-06-08

## 2024-06-05 NOTE — ED Provider Notes (Signed)
 [place] EMERGENCY DEPARTMENT AT [facility] Provider Note   CSN: [id number] Arrival date & time: 06/05/24  1337     Patient presents with: Shortness of Breath   Lee [name] is a 43 y.o. male.   The history is provided by the patient and medical records. No language interpreter was used.  Shortness of Breath    43 year old male with history of asthma, Hodgkin lymphoma, HIV, AIDS,  presents ED for evaluation of shortness of breath.  Patient states for the past 2 days he has had wheezing, sinus congestion, cough, and gradual shortness of breath.  Shortness of breath worse with short ambulation.  Endorses some subjective fever but no chills or bodyaches.  No nausea vomiting or diarrhea.  He was seen in the ER yesterday for his complaint.  Received multiple dose of treatments and was found to have some hypoxia with an O2 sats of 88% on room air.  Patient was given options to get admitted but due to prolonged wait patient prefers to go home.  He has not had a chance to get his steroid yet but I throughout the day today his shortness of breath increases prompting this ER visit.  No prior history of PE or DVT no recent surgery or prolonged bedrest no active cancer.  States that his CD4 count and viral loads in good standing and states he had it checked several weeks ago by his ID specialist Dr. Eben.  Prior to Admission medications   Medication Sig Start Date End Date Taking? Authorizing Provider  acetaminophen  (TYLENOL ) 650 MG CR tablet Take 650 mg by mouth every 8 (eight) hours as needed for pain.    [provider]  albuterol  (VENTOLIN  HFA) 108 (90 Base) MCG/ACT inhaler Inhale 2 puffs into the lungs every 6 (six) hours as needed for wheezing or shortness of breath. 06/05/24   Haze Lonni PARAS, MD  amLODipine  (NORVASC ) 10 MG tablet Take 1 tablet by mouth once daily 03/25/24   Eben Reyes BROCKS, MD  azelastine  (ASTELIN ) 0.1 % nasal spray 1-2 puffs in each  nostril twice daily    Frutoso Luz, MD  azelastine  (OPTIVAR ) 0.05 % ophthalmic solution Apply to eye. 01/17/22   [provider]  [name]  50-200-25 MG TABS tablet TAKE 1 TABLET DAILY 03/04/24   Eben Reyes BROCKS, MD  BREO ELLIPTA  200-25 MCG/ACT AEPB Inhale 1 puff by mouth once daily 02/28/24   Eben Reyes BROCKS, MD  EPINEPHrine  0.3 mg/0.3 mL IJ SOAJ injection Inject 0.3 mg into the muscle once.    Frutoso Luz, MD  loratadine  (CLARITIN ) 10 MG tablet Take 10 mg by mouth daily.    [provider]  montelukast  (SINGULAIR ) 10 MG tablet Take 10 mg by mouth at bedtime. 12/01/21   [provider]  mupirocin  ointment (BACTROBAN ) 2 % Apply 1 Application topically 2 (two) times daily. Apply topical to the affected area twice a day until clear 12/20/22   Alm Delon SAILOR, DO  ondansetron  (ZOFRAN -ODT) 8 MG disintegrating tablet Take 1 tablet (8 mg total) by mouth every 8 (eight) hours as needed for nausea or vomiting. 10/23/22   Federico Norleen ONEIDA MADISON, MD  predniSONE  (DELTASONE ) 50 MG tablet Take 1 tablet (50 mg total) by mouth daily with breakfast for 3 days. 06/05/24 06/08/24  Neysa Caron PARAS, DO  prochlorperazine  (COMPAZINE ) 10 MG tablet Take 1 tablet (10 mg total) by mouth every 6 (six) hours as needed for nausea or vomiting. 10/08/22   Federico Norleen ONEIDA  IV, MD  Semaglutide,0.25 or 0.5MG /DOS, (OZEMPIC, 0.25 OR 0.5 MG/DOSE,) 2 MG/1.5ML SOPN Inject 0.5 mg into the skin once a week. 05/20/24   Eben Reyes BROCKS, MD  tadalafil (CIALIS) 5 MG tablet Take 1 tablet (5 mg total) by mouth daily as needed for erectile dysfunction. 05/20/24   Eben Reyes BROCKS, MD  Vitamin D, Ergocalciferol, (DRISDOL) 1.25 MG (50000 UNIT) CAPS capsule Take 50,000 Units by mouth once a week. 06/06/23   [provider]    Allergies: Gabapentin , Lisinopril , and Nickel    Review of Systems  Respiratory:  Positive for shortness of breath.   All other systems reviewed and are negative.   Updated Vital  Signs BP (!) 137/96 (BP Location: Left Arm)   Pulse (!) 127   Temp 99.4 F (37.4 C) (Oral)   Resp 20   SpO2 96%   Physical Exam Constitutional:      General: He is not in acute distress.    Appearance: He is well-developed.  HENT:     Head: Atraumatic.  Eyes:     Conjunctiva/sclera: Conjunctivae normal.  Cardiovascular:     Rate and Rhythm: Tachycardia present.     Pulses: Normal pulses.     Heart sounds: Normal heart sounds.  Pulmonary:     Breath sounds: Wheezing present. No rhonchi or rales.  Abdominal:     Palpations: Abdomen is soft.     Tenderness: There is no abdominal tenderness.  Musculoskeletal:     Cervical back: Normal range of motion and neck supple.  Skin:    Findings: No rash.  Neurological:     Mental Status: He is alert.     (all labs ordered are listed, but only abnormal results are displayed) Labs Reviewed  RESP PANEL BY RT-PCR (RSV, FLU A&B, COVID)  [id number]  CBC WITH DIFFERENTIAL/PLATELET  BASIC METABOLIC PANEL WITH GFR    EKG: None  Radiology: DG Chest 2 View Result Date: 06/05/2024 CLINICAL DATA:  Shortness of breath. EXAM: CHEST - 2 VIEW COMPARISON:  Chest radiograph dated 06/04/2024. FINDINGS: There is peribronchial thickening which may represent bronchitis or reactive airway disease. Left infrahilar/lingular density may represent atelectasis or infiltrate. No pleural effusion or pneumothorax. The cardiac silhouette is within normal limits. No acute osseous pathology. IMPRESSION: 1. Findings suggestive of bronchitis or reactive airway disease. 2. Left infrahilar/lingular atelectasis or infiltrate. Electronically Signed   By: Vanetta Chou M.D.   On: 06/05/2024 15:48   DG Chest 2 View Result Date: 06/04/2024 CLINICAL DATA:  Shortness of breath EXAM: CHEST - 2 VIEW COMPARISON:  Chest x-ray 12/28/2021, CT chest abdomen and pelvis 04/14/2024. FINDINGS: There central peribronchial wall thickening bilaterally. There is mild prominence of the  bilateral hilar regions likely related to adenopathy seen on prior study. There is no focal lung infiltrate, pleural effusion or pneumothorax. The cardiac silhouette is within normal limits. The osseous structures appear normal. IMPRESSION: 1. Central peribronchial wall thickening bilaterally, which can be seen in the setting of bronchitis or reactive airway disease. 2. Mild prominence of the bilateral hilar regions likely related to adenopathy seen on prior study. Electronically Signed   By: Greig Pique M.D.   On: 06/04/2024 18:55     Procedures   Medications Ordered in the ED  ipratropium-albuterol  (DUONEB) 0.5-2.5 (3) MG/3ML nebulizer solution 3 mL (3 mLs Nebulization Given 06/05/24 1425)  predniSONE  (DELTASONE ) tablet 60 mg (60 mg Oral Given 06/05/24 1425)  albuterol  (PROVENTIL ) (2.5 MG/3ML) 0.083% nebulizer solution 2.5 mg (2.5 mg Nebulization  Given 06/05/24 1532)  sodium chloride  0.9 % bolus 1,000 mL (0 mLs Intravenous Stopped 06/05/24 1743)  magnesium  sulfate IVPB 2 g 50 mL (0 g Intravenous Stopped 06/05/24 1627)  cefTRIAXone  (ROCEPHIN ) 1 g in sodium chloride  0.9 % 100 mL IVPB (0 g Intravenous Stopped 06/05/24 1723)  azithromycin  (ZITHROMAX ) tablet 500 mg (500 mg Oral Given 06/05/24 1656)                                    Medical Decision Making Risk Prescription drug management.   BP (!) 137/96 (BP Location: Left Arm)   Pulse (!) 127   Temp 99.4 F (37.4 C) (Oral)   Resp 20   SpO2 96%   81:56 PM 43 year old male with history of asthma, Hodgkin lymphoma, HIV, AIDS presents ED for evaluation of shortness of breath.  Patient states for the past 2 days he has had wheezing, sinus congestion, cough, and gradual shortness of breath.  Shortness of breath worse with short ambulation.  Endorses some subjective fever but no chills or bodyaches.  No nausea vomiting or diarrhea.  He was seen in the ER yesterday for his complaint.  Received multiple dose of treatments and was found to  have some hypoxia with an O2 sats of 88% on room air.  Patient was given options to get admitted but due to prolonged wait patient prefers to go home.  He has not had a chance to get his steroid yet but I throughout the day today his shortness of breath increases prompting this ER visit.  No prior history of PE or DVT no recent surgery or prolonged bedrest no active cancer.  States that his CD4 count and viral loads in good standing and states he had it checked several weeks ago by his ID specialist Dr. Eben.  On exam, patient is able to speak in complete sentences however he is tachycardic and he does have both inspiratory and expiratory wheezes heard.  He does not appear to be fluid overloaded.  His symptoms more likely to be viral illness causing reactive airway disease and less likely to be PE or CHF.  Will continue with current treatment anticipate hospital admission if no improvement.  -Labs ordered, independently viewed and interpreted by me.  Labs remarkable for normal WBC, normal H&H, electrolyte panels are reassuring, viral respiratory panel negative -The patient was maintained on a cardiac monitor.  I personally viewed and interpreted the cardiac monitored which showed an underlying rhythm of: Sinus tachycardia -Imaging independently viewed and interpreted by me and I agree with radiologist's interpretation.  Result remarkable for chest x-ray shows findings suggestive of bronchitis or reactive airway disease however there is a left infrahilar atelectasis or infiltrate.  Since patient does have history of HIV and is immunocompromise, will initiate antibiotic to cover for potential superimposed bacterial infection.  Patient given Rocephin  and Zithromax .  When ambulating, O2 sats remain 95% on room air and patient felt better. -This patient presents to the ED for concern of shortness of breath, this involves an extensive number of treatment options, and is a complaint that carries with it a high  risk of complications and morbidity.  The differential diagnosis includes viral illness, pneumonia, PE, PTX, asthma, COPD, reactive airway disease -Co morbidities that complicate the patient evaluation includes asthma, HIV -Treatment includes Rocephin , Zithromax , DuoNebs, magnesium , IV fluid -Reevaluation of the patient after these medicines showed that the patient improved. patient  appears more comfortable, lung sounds improved.  He is still tachycardic with heart rates in the 120s.  This is likely in the setting of recent albuterol  use.  Will continue to monitor and will decide whether he needs admission or not. -PCP office notes or outside notes reviewed -Discussion with specialist [facility] Dr. Gonfa who agrees to admit pt -Escalation to admission/observation considered: patient agrees with hospital admission       Final diagnoses:  Moderate persistent asthma with exacerbation  Pneumonia of left lower lobe due to infectious organism    ED Discharge Orders     None          Nivia Colon, PA-C 06/05/24 1906    Simon Lavonia SAILOR, MD 06/06/24 [phone]

## 2024-06-05 NOTE — H&P (Addendum)
 History and Physical    Patient: Lee [name] FMW:[id number] DOB: 1980-11-08 DOA: 06/05/2024 DOS: the patient was seen and examined on 06/05/2024 PCP: Merna Huxley, NP  Patient coming from: Home  Chief Complaint:  Chief Complaint  Patient presents with   Shortness of Breath   HPI: Lee [name] is a 43 y.o. male with PMH of HIV, Hodgkin's lymphoma, gallstone pancreatitis, syphilis, HTN and sensorineural hearing loss presented to ED with ongoing respiratory symptoms.  Patient presented to [facility] ED last night with SOB, DOE, wheeze, cough, sinus congestion and subjective fever.  He was hypoxic to 88%.  He was offered hospital admission for further treatment but he decided to go home instead of waiting on ambulance for 4 to 6 hours.   Patient had worsening symptoms after he went home with increased chest tightness, DOE, and wheezing that prompted him to return to ED. he stated that he has to stop to catch his breath even to get to bathroom.  He tried to use the rescue inhaler without significant improvement.  He reports good compliance with his home Breo.  He did not use his prescription for prednisone  from ED last night.  He denies fever, rhinorrhea, sore throat, GI or UTI symptoms.  He denies known sick contacts.  Reports good compliance with [name] .  Patient reports improvement in his breathing after treatment in ED but his blood pressure and heart rate went up..   Patient denies smoking cigarette, drinking alcohol  or using recreational drugs.  He is interested in cardiopulmonary cessation in event of sudden cardiopulmonary arrest.   In ED, mild temp to 99.4.  HR went from 95-120s.  BP slightly elevated.  Saturating in upper 90s on RA.  CMP and CBC without significant finding.  COVID-19, influenza and RSV PCR nonreactive.  CXR suggested bronchitis/RAD and left infrahilar and lingular atelectasis or infiltrate.  Patient was given prednisone  and nebulizers.   Admission requested for possible asthma exacerbation and tachycardia.  Review of Systems: As mentioned in the history of present illness. All other systems reviewed and are negative. Past Medical History:  Diagnosis Date   CKD (chronic kidney disease) stage 3, GFR 30-59 ml/min (HCC) 02/20/2019   Eczema    Environmental allergies    Essential hypertension    HIV test positive (HCC)    Hodgkin's lymphoma (HCC)    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HCC) 07/24/2016   Shingles    Past Surgical History:  Procedure Laterality Date   CHOLECYSTECTOMY N/A 12/30/2021   Procedure: LAPAROSCOPIC CHOLECYSTECTOMY WITH  CHOLANGIOGRAM;  Surgeon: Eletha Boas, MD;  Location: [facility];  Service: General;  Laterality: N/A;   IR IMAGING GUIDED PORT INSERTION  10/18/2022   IR REMOVAL TUN ACCESS W/ PORT W/O FL MOD SED  11/16/2023   LAPAROSCOPIC APPENDECTOMY N/A 03/12/2020   Procedure: APPENDECTOMY LAPAROSCOPIC;  Surgeon: Debby Hila, MD;  Location: [facility];  Service: General;  Laterality: N/A;   TYMPANOSTOMY TUBE PLACEMENT     WISDOM TOOTH EXTRACTION     WRIST SURGERY     Social History:  reports that he has never smoked. He has never used smokeless tobacco. He reports that he does not drink alcohol  and does not use drugs.  Allergies  Allergen Reactions   Gabapentin  Nausea And Vomiting   Lisinopril  Other (See Comments)    Dry cough     Nickel Rash    Family History  Problem Relation Age of Onset   Hypercalcemia Mother    Hypertension  Mother    Hypertension Father    Prostate cancer Father    Multiple myeloma Father     Prior to Admission medications   Medication Sig Start Date End Date Taking? Authorizing Provider  acetaminophen  (TYLENOL ) 650 MG CR tablet Take 650 mg by mouth every 8 (eight) hours as needed for pain.    [provider]  albuterol  (VENTOLIN  HFA) 108 (90 Base) MCG/ACT inhaler Inhale 2 puffs into the lungs every 6 (six) hours as needed for wheezing or shortness of  breath. 06/05/24   Haze Lonni PARAS, MD  amLODipine  (NORVASC ) 10 MG tablet Take 1 tablet by mouth once daily 03/25/24   Eben Reyes BROCKS, MD  azelastine  (ASTELIN ) 0.1 % nasal spray 1-2 puffs in each nostril twice daily    Frutoso Luz, MD  azelastine  (OPTIVAR ) 0.05 % ophthalmic solution Apply to eye. 01/17/22   [provider]  [name]  50-200-25 MG TABS tablet TAKE 1 TABLET DAILY 03/04/24   Eben Reyes BROCKS, MD  BREO ELLIPTA  200-25 MCG/ACT AEPB Inhale 1 puff by mouth once daily 02/28/24   Eben Reyes BROCKS, MD  EPINEPHrine  0.3 mg/0.3 mL IJ SOAJ injection Inject 0.3 mg into the muscle once.    Frutoso Luz, MD  loratadine  (CLARITIN ) 10 MG tablet Take 10 mg by mouth daily.    [provider]  montelukast  (SINGULAIR ) 10 MG tablet Take 10 mg by mouth at bedtime. 12/01/21   [provider]  mupirocin  ointment (BACTROBAN ) 2 % Apply 1 Application topically 2 (two) times daily. Apply topical to the affected area twice a day until clear 12/20/22   Alm Delon SAILOR, DO  ondansetron  (ZOFRAN -ODT) 8 MG disintegrating tablet Take 1 tablet (8 mg total) by mouth every 8 (eight) hours as needed for nausea or vomiting. 10/23/22   Federico Norleen ONEIDA MADISON, MD  predniSONE  (DELTASONE ) 50 MG tablet Take 1 tablet (50 mg total) by mouth daily with breakfast for 3 days. 06/05/24 06/08/24  Neysa Caron PARAS, DO  prochlorperazine  (COMPAZINE ) 10 MG tablet Take 1 tablet (10 mg total) by mouth every 6 (six) hours as needed for nausea or vomiting. 10/08/22   Federico Norleen ONEIDA MADISON, MD  Semaglutide,0.25 or 0.5MG /DOS, (OZEMPIC, 0.25 OR 0.5 MG/DOSE,) 2 MG/1.5ML SOPN Inject 0.5 mg into the skin once a week. 05/20/24   Eben Reyes BROCKS, MD  tadalafil (CIALIS) 5 MG tablet Take 1 tablet (5 mg total) by mouth daily as needed for erectile dysfunction. 05/20/24   Eben Reyes BROCKS, MD  Vitamin D, Ergocalciferol, (DRISDOL) 1.25 MG (50000 UNIT) CAPS capsule Take 50,000 Units by mouth once a week. 06/06/23   [provider]    Physical Exam: Vitals:   06/05/24 1645 06/05/24 1715 06/05/24 1800 06/05/24 1810  BP: (!) 153/98 (!) 152/87 (!) 156/79   Pulse:  (!) 117 (!) 122   Resp: 19 18 19    Temp:    97.8 F (36.6 C)  TempSrc:    Oral  SpO2: 97% 97% 96%    GENERAL: No apparent distress.  Nontoxic. HEENT: MMM.  Vision and hearing grossly intact.  NECK: Supple.  No apparent JVD.  RESP: Getting nebulizers.  No IWOB.  Fair aeration bilaterally. CVS: HR 114.  Regular rhythm. Heart sounds normal.  ABD/GI/GU: BS+. Abd soft, NTND.  MSK/EXT:   No apparent deformity. Moves extremities. No edema.  SKIN: no apparent skin lesion or wound NEURO: Awake and alert. Oriented appropriately.  No apparent focal neuro deficit. PSYCH: Calm. Normal affect.  Data  Reviewed: See HPI  Assessment and Plan: Acute asthma exacerbation: Presents with chest tightness, SOB, DOE, wheezing and cough that did not improve much with home rescue inhaler..  Reports good compliance with his Breo.  COVID-19, influenza and RSV PCR nonreactive.  CXR with bronchitis/RAD and possible left infrahilar/lingular atelectasis or infiltrate.  Received prednisone  and multiple rounds of bronchodilators in ED.  Respiratory symptoms improved.  Exam reassuring. - Continue p.o. prednisone  - Xopenex  every 6 hours scheduled and every 2 hours as needed given tachycardia. - Pulmicort 0.5 mg twice daily - Check 20 pathogen RVP and procalcitonin  Sinus tachycardia: Likely due to nebulizers. - Change nebulizers as above  HIV: Undetectable HIV-1 RNA with CD4 counts of 944 on 10/7.  Compliant with [name]  - Continue home [name] .  History of Hodgkin's lymphoma: Stage III.  Status post 6 cycles of chemotherapy.  Posttreatment.  Scan showed no evidence of residual or recurrence of disease.  Followed by Dr. Federico. - Outpatient follow-up  Essential hypertension: BP slightly elevated today - Resume home amlodipine  after med rec  Seasonal allergies -  Continue home meds after med rec    Advance Care Planning:   Code Status: Full Code-discussed with patient.  Consults: None  Family Communication: Cousins at bedside but left the room to give patient privacy  Severity of Illness: The appropriate patient status for this patient is OBSERVATION. Observation status is judged to be reasonable and necessary in order to provide the required intensity of service to ensure the patient's safety. The patient's presenting symptoms, physical exam findings, and initial radiographic and laboratory data in the context of their medical condition is felt to place them at decreased risk for further clinical deterioration. Furthermore, it is anticipated that the patient will be medically stable for discharge from the hospital within 2 midnights of admission.   Author: Mignon ONEIDA Bump, MD 06/05/2024 7:07 PM  For on call review [email or web address].

## 2024-06-05 NOTE — ED Provider Notes (Signed)
 Patient signed out to me while receiving continuous nebulizer treatment.  Upon recheck he does appear comfortable.  Oxygen saturations are 93 to 96%.  Upon auscultation he still has active wheezing but adequate air movement.  Offered admission versus monitoring here for additional breathing treatments.  Patient reports that he feels well enough to go home, will return or go to [facility] if his symptoms worsen.   Haze Lonni PARAS, MD 06/05/24 [phone]

## 2024-06-05 NOTE — ED Notes (Signed)
 The patient was able to ambulate around the nurses station with independent steady gait. O2 sats maintained 95-97% and HR was 130-133.

## 2024-06-05 NOTE — ED Provider Triage Note (Signed)
 Emergency Medicine Provider Triage Evaluation Note  Derrill Bagnell , a 43 y.o. male  was evaluated in triage.  Pt complains of asthma exacerbation, was seen by [facility] ED and recommended admission, but decided to go home. Has not been able to steroids yet. Provided one dose of prednisone  yesterday.   Reporting worsening shortness of breath and wheezing. Taking HIV medications regularly.   Denies fever, headache, vision changes, chest pain, abdominal pain, n/v/d, LE swelling, rashes, dysuria.   Review of Systems  Positive: N/a Negative: N/a  Physical Exam  BP (!) 137/96 (BP Location: Left Arm)   Pulse (!) 127   Temp 99.4 F (37.4 C) (Oral)   Resp 20   SpO2 96%  Gen:   Awake, no distress   Resp:  Normal effort  MSK:   Moves extremities without difficulty  Other:    Medical Decision Making  Medically screening exam initiated at 2:09 PM.  Appropriate orders placed.  Artist Raymund Mall was informed that the remainder of the evaluation will be completed by another provider, this initial triage assessment does not replace that evaluation, and the importance of remaining in the ED until their evaluation is complete.     Beola Terrall RAMAN, [place] 06/05/24 1413

## 2024-06-05 NOTE — ED Triage Notes (Signed)
 Pt arrives via POV. PT report ongoing sob with exertion. States he was seen at [facility] yesterday was offered admission due to his oxygen dropping into the 80s when he ambulated. PT arrives AxOx4. He is tachycardic during triage.

## 2024-06-06 DIAGNOSIS — Z7985 Long-term (current) use of injectable non-insulin antidiabetic drugs: Secondary | ICD-10-CM | POA: Diagnosis not present

## 2024-06-06 DIAGNOSIS — Z8249 Family history of ischemic heart disease and other diseases of the circulatory system: Secondary | ICD-10-CM | POA: Diagnosis not present

## 2024-06-06 DIAGNOSIS — Z79899 Other long term (current) drug therapy: Secondary | ICD-10-CM | POA: Diagnosis not present

## 2024-06-06 DIAGNOSIS — I129 Hypertensive chronic kidney disease with stage 1 through stage 4 chronic kidney disease, or unspecified chronic kidney disease: Secondary | ICD-10-CM | POA: Diagnosis present

## 2024-06-06 DIAGNOSIS — Z7952 Long term (current) use of systemic steroids: Secondary | ICD-10-CM | POA: Diagnosis not present

## 2024-06-06 DIAGNOSIS — T50995A Adverse effect of other drugs, medicaments and biological substances, initial encounter: Secondary | ICD-10-CM | POA: Diagnosis not present

## 2024-06-06 DIAGNOSIS — Z9221 Personal history of antineoplastic chemotherapy: Secondary | ICD-10-CM | POA: Diagnosis not present

## 2024-06-06 DIAGNOSIS — B348 Other viral infections of unspecified site: Secondary | ICD-10-CM | POA: Diagnosis not present

## 2024-06-06 DIAGNOSIS — Z888 Allergy status to other drugs, medicaments and biological substances status: Secondary | ICD-10-CM | POA: Diagnosis not present

## 2024-06-06 DIAGNOSIS — J45901 Unspecified asthma with (acute) exacerbation: Secondary | ICD-10-CM | POA: Diagnosis not present

## 2024-06-06 DIAGNOSIS — B9789 Other viral agents as the cause of diseases classified elsewhere: Secondary | ICD-10-CM | POA: Diagnosis present

## 2024-06-06 DIAGNOSIS — N183 Chronic kidney disease, stage 3 unspecified: Secondary | ICD-10-CM | POA: Diagnosis present

## 2024-06-06 DIAGNOSIS — H905 Unspecified sensorineural hearing loss: Secondary | ICD-10-CM | POA: Diagnosis present

## 2024-06-06 DIAGNOSIS — Z21 Asymptomatic human immunodeficiency virus [HIV] infection status: Secondary | ICD-10-CM | POA: Diagnosis present

## 2024-06-06 DIAGNOSIS — Z8571 Personal history of Hodgkin lymphoma: Secondary | ICD-10-CM | POA: Diagnosis not present

## 2024-06-06 DIAGNOSIS — R000 Tachycardia, unspecified: Secondary | ICD-10-CM | POA: Diagnosis not present

## 2024-06-06 DIAGNOSIS — J4541 Moderate persistent asthma with (acute) exacerbation: Secondary | ICD-10-CM | POA: Diagnosis present

## 2024-06-06 DIAGNOSIS — Z91048 Other nonmedicinal substance allergy status: Secondary | ICD-10-CM | POA: Diagnosis not present

## 2024-06-06 DIAGNOSIS — Z9049 Acquired absence of other specified parts of digestive tract: Secondary | ICD-10-CM | POA: Diagnosis not present

## 2024-06-06 DIAGNOSIS — R0902 Hypoxemia: Secondary | ICD-10-CM | POA: Diagnosis present

## 2024-06-06 DIAGNOSIS — Z7951 Long term (current) use of inhaled steroids: Secondary | ICD-10-CM | POA: Diagnosis not present

## 2024-06-06 LAB — COMPREHENSIVE METABOLIC PANEL WITH GFR
ALT: 8 U/L (ref 0–44)
AST: 17 U/L (ref 15–41)
Albumin: 3.5 g/dL (ref 3.5–5.0)
Alkaline Phosphatase: 83 U/L (ref 38–126)
Anion gap: 8 (ref 5–15)
BUN: 19 mg/dL (ref 6–20)
CO2: 22 mmol/L (ref 22–32)
Calcium: 8.8 mg/dL — ABNORMAL LOW (ref 8.9–10.3)
Chloride: 108 mmol/L (ref 98–111)
Creatinine, Ser: 1.18 mg/dL (ref 0.61–1.24)
GFR, Estimated: >60 mL/min (ref >60)
Glucose, Bld: 112 mg/dL — ABNORMAL HIGH (ref 70–99)
Potassium: 4.1 mmol/L (ref 3.5–5.1)
Sodium: 138 mmol/L (ref 135–145)
Total Bilirubin: 0.5 mg/dL (ref 0.0–1.2)
Total Protein: 7.8 g/dL (ref 6.5–8.1)

## 2024-06-06 LAB — CBC
HCT: 42.4 % (ref 39.0–52.0)
Hemoglobin: 15.3 g/dL (ref 13.0–17.0)
MCH: 32.8 pg (ref 26.0–34.0)
MCHC: 36.1 g/dL — ABNORMAL HIGH (ref 30.0–36.0)
MCV: 91 fL (ref 80.0–100.0)
Platelets: 223 K/uL (ref 150–400)
RBC: 4.66 MIL/uL (ref 4.22–5.81)
RDW: 13.2 % (ref 11.5–15.5)
WBC: 14 K/uL — ABNORMAL HIGH (ref 4.0–10.5)
nRBC: 0 % (ref 0.0–0.2)

## 2024-06-06 LAB — RESPIRATORY PANEL BY PCR

## 2024-06-06 LAB — PROCALCITONIN: Procalcitonin: <0.1 ng/mL

## 2024-06-06 LAB — GLUCOSE, CAPILLARY: Glucose-Capillary: 140 mg/dL — ABNORMAL HIGH (ref 70–99)

## 2024-06-06 MED ORDER — LEVALBUTEROL HCL 0.63 MG/3ML IN NEBU
0.6300 mg | INHALATION_SOLUTION | Freq: Four times a day (QID) | RESPIRATORY_TRACT | Status: DC
  Administered 2024-06-07 – 2024-06-08 (×5): 0.63 mg via RESPIRATORY_TRACT
  Filled 2024-06-06 (×7): qty 3

## 2024-06-06 NOTE — Plan of Care (Signed)

## 2024-06-06 NOTE — ED Notes (Signed)
 The patient is ambulatory to restroom with independent steady gait.

## 2024-06-06 NOTE — Progress Notes (Signed)
 Progress Note   Patient: Lee [name] FMW:[id number] DOB: March 03, 1981 DOA: 06/05/2024     0 DOS: the patient was seen and examined on 06/06/2024   Brief hospital course: Per H&P:  Syair Fricker is a 43 y.o. male with PMH of HIV, Hodgkin's lymphoma, gallstone pancreatitis, syphilis, HTN and sensorineural hearing loss presented to ED with ongoing respiratory symptoms.   Patient presented to [facility] ED last night with SOB, DOE, wheeze, cough, sinus congestion and subjective fever.  He was hypoxic to 88%.  He was offered hospital admission for further treatment but he decided to go home instead of waiting on ambulance for 4 to 6 hours.    Patient had worsening symptoms after he went home with increased chest tightness, DOE, and wheezing that prompted him to return to ED. he stated that he has to stop to catch his breath even to get to bathroom.  He tried to use the rescue inhaler without significant improvement. ...  Patient was admitted for acute asthma exacerbation, started on systemic and inhaled steroids, scheduled and PRN nebulized bronchodilators as outlined below.  Full respiratory viral panel + Rhinovirus infection.    Assessment and Plan:  Acute asthma exacerbation: Today has persistent diffuse wheezing, but severity of dyspnea has improved.  Remains on room air. Rhinovirus infection - Continue p.o. prednisone  - Scheduled & PRN Xopenex  nebs given tachycardia. - Pulmicort 0.5 mg twice daily - Supportive / symptomatic care per orders   Sinus tachycardia: Likely due to nebulizers. - Xopenex  instead of albuterol    HIV: Undetectable HIV-1 RNA with CD4 counts of 944 on 10/7.  Compliant with [name]  - Continue home [name] .   History of Hodgkin's lymphoma: Stage III.  Status post 6 cycles of chemotherapy.  Posttreatment.  Scan showed no evidence of residual or recurrence of disease.  Followed by Dr. Federico. - Outpatient follow-up   Essential  hypertension: BP slightly elevated today - Resume home amlodipine  after med rec   Seasonal allergies - Continue home meds after med rec      Subjective: Pt seen in ED holding for a bed today. He reports shortness of breath is much better today.  He's working on his laptop. Hopes to go home soon, but still wheezing a lot.  No fever/chills or cough, congestion.   Physical Exam: Vitals:   06/06/24 1400 06/06/24 1505 06/06/24 1758 06/06/24 1900  BP:  132/85  130/84  Pulse:  (!) 114  (!) 111  Resp:  18  18  Temp: 98.1 F (36.7 C) 98.7 F (37.1 C)  98.1 F (36.7 C)  TempSrc: Oral     SpO2:  95% 96% 96%   General exam: awake, alert, no acute distress HEENT: atraumatic, clear conjunctiva, anicteric sclera, moist mucus membranes, hearing grossly normal  Respiratory system: diffuse expiratory wheezes in all lung fields, overall good air movement, normal respiratory effort, no accessory muscle use and able to speak in full sentences. Cardiovascular system: normal S1/S2, tachycardic, regular rhythm, no peripheral edema.   Gastrointestinal system: soft, NT, ND, no HSM felt, +bowel sounds. Central nervous system: A&O x3. no gross focal neurologic deficits, normal speech Extremities: moves all, no edema, normal tone Skin: dry, intact, normal temperature, normal color, No rashes, lesions or ulcers Psychiatry: normal mood, congruent affect, judgement and insight appear normal  Data Reviewed:  Notable labs --  Glucose 112, Ca 8.8 otherwise normal CMP WBC 14.0 likely steroid-induced  Family Communication: at bedside on rounds  Disposition: Status is: Inpatient  Remains inpatient appropriate because: severe persistent wheezing, requires further clinical improvement   Planned Discharge Destination: Home    Time spent: 45 minutes  Author: Burnard DELENA Cunning, DO 06/06/2024 7:18 PM  For on call review [email or web address].

## 2024-06-07 DIAGNOSIS — J45901 Unspecified asthma with (acute) exacerbation: Secondary | ICD-10-CM | POA: Diagnosis not present

## 2024-06-07 DIAGNOSIS — B348 Other viral infections of unspecified site: Secondary | ICD-10-CM | POA: Diagnosis not present

## 2024-06-07 MED ORDER — LEVALBUTEROL HCL 0.63 MG/3ML IN NEBU: 0.6300 mg | INHALATION_SOLUTION | Freq: Four times a day (QID) | RESPIRATORY_TRACT | 1 refills | Status: AC

## 2024-06-07 MED ORDER — ALBUTEROL SULFATE HFA 108 (90 BASE) MCG/ACT IN AERS: 2.0000 | INHALATION_SPRAY | RESPIRATORY_TRACT | Status: AC | PRN

## 2024-06-07 MED ORDER — GUAIFENESIN ER 600 MG PO TB12: 600.0000 mg | ORAL_TABLET | Freq: Two times a day (BID) | ORAL | 0 refills | Status: AC

## 2024-06-07 MED ORDER — PREDNISONE 10 MG PO TABS: ORAL_TABLET | ORAL | 0 refills | Status: AC

## 2024-06-07 NOTE — TOC Transition Note (Addendum)
 Transition of Care [facility]) - Discharge Note   Patient Details  Name: Lee [name] MRN: [id number] Date of Birth: Dec 08, 1980  Transition of Care [facility] CM/SW Contact:  Heather DELENA Saltness, LCSW Phone Number: 06/07/2024, 10:29 AM   Clinical Narrative:    Pt discharging home today. TOC consulted for home Nebulizer machine. CSW met with pt at bedside to discuss discharge planning and ordering Nebulizer machine. Pt agreeable to home Nebulizer machine, denied DME agency preference. Nebulizer machine ordered through [employer]. CSW spoke with Aida who confirmed ability to deliver Nebulizer machine to pt's home tomorrow 11/16. No further TOC needs at this time.   Final next level of care: Home/Self Care Barriers to Discharge: Barriers Resolved   Patient Goals and CMS Choice Patient states their goals for this hospitalization and ongoing recovery are:: To return home        Discharge Placement  Home              Patient to be transferred to facility by: Family Name of family member notified: Patient Patient and family notified of of transfer: 06/07/24  Discharge Plan and Services Additional resources added to the After Visit Summary for                  DME Arranged: Nebulizer/meds DME Agency: [employer] Date DME Agency Contacted: 06/07/24 Time DME Agency Contacted: 1029 Representative spoke with at DME Agency: Caprice CHEADLE Agency: NA        Social Drivers of Health (SDOH) Interventions SDOH Screenings   Food Insecurity: No Food Insecurity (06/06/2024)  Housing: Low Risk  (06/06/2024)  Transportation Needs: No Transportation Needs (06/06/2024)  Utilities: Not At Risk (06/06/2024)  Alcohol  Screen: Low Risk  (06/11/2023)  Depression (PHQ2-9): Low Risk  (05/20/2024)  Financial Resource Strain: Low Risk  (06/11/2023)  Physical Activity: Sufficiently Active (06/11/2023)  Social Connections: Moderately Integrated (06/11/2023)  Stress: Stress Concern Present (06/11/2023)   Tobacco Use: Low Risk  (06/05/2024)     Readmission Risk Interventions    06/07/2024   10:28 AM  Readmission Risk Prevention Plan  Transportation Screening Complete  PCP or Specialist Appt within 3-5 Days Complete  HRI or Home Care Consult Complete  Social Work Consult for Recovery Care Planning/Counseling Complete  Palliative Care Screening Not Applicable  Medication Review [employer]) Complete     Signed: Heather Saltness, MSW, LCSW Clinical Social Worker Inpatient Care Management 06/07/2024 10:31 AM

## 2024-06-07 NOTE — Progress Notes (Signed)
SATURATION QUALIFICATIONS: (This note is used to comply with regulatory documentation for home oxygen)  Patient Saturations on Room Air at Rest = 94%  Patient Saturations on Room Air while Ambulating = 93%  Patient Saturations on 0 Liters of oxygen while Ambulating = 93%  Please briefly explain why patient needs home oxygen:

## 2024-06-07 NOTE — Discharge Summary (Signed)
 Physician Discharge Summary   Patient: Lee [name] MRN: [id number] DOB: 11-09-1980  Admit date:     06/05/2024  Discharge date: 06/08/2024  Discharge Physician: Burnard DELENA Cunning   PCP: Merna Huxley, NP   Recommendations at discharge:    Follow up with PCP in 1-2 weeks Follow up on recovery from asthma exacerbation triggered by rhinovirus URI  Discharge Diagnoses: Principal Problem:   Acute asthma exacerbation  Resolved Problems:   * No resolved hospital problems. *  Hospital Course:  Per H&P:  Lee [name] is a 43 y.o. male with PMH of HIV, Hodgkin's lymphoma, gallstone pancreatitis, syphilis, HTN and sensorineural hearing loss presented to ED with ongoing respiratory symptoms.   Patient presented to [facility] ED last night with SOB, DOE, wheeze, cough, sinus congestion and subjective fever.  He was hypoxic to 88%.  He was offered hospital admission for further treatment but he decided to go home instead of waiting on ambulance for 4 to 6 hours.    Patient had worsening symptoms after he went home with increased chest tightness, DOE, and wheezing that prompted him to return to ED. he stated that he has to stop to catch his breath even to get to bathroom.  He tried to use the rescue inhaler without significant improvement. ...   Patient was admitted for acute asthma exacerbation, started on systemic and inhaled steroids, scheduled and PRN nebulized bronchodilators as outlined below.   Full respiratory viral panel + Rhinovirus infection.  Further hospital course and management as outlined below.  11/16 -- pt feeling better, medically stable and requesting discharge home today  Assessment and Plan:  Acute asthma exacerbation: Today has persistent diffuse wheezing, but severity of dyspnea has improved.  Remains on room air. Rhinovirus infection - Continue p.o. prednisone  - Scheduled & PRN Xopenex  nebs given tachycardia. - Pulmicort  0.5 mg twice  daily - Supportive / symptomatic care per orders 11/15 --patient maintains O2 sats above 90% on room air with ambulation.  Ongoing persistent wheezing and dyspnea worse than baseline.    11/16 -- improving.  Stable for discharge. --d/c on prednisone  taper, breo, PRN xopenex  --close PCP follow up   Sinus tachycardia: Likely due to nebulizers. - Xopenex  instead of albuterol    HIV: Undetectable HIV-1 RNA with CD4 counts of 944 on 10/7.  Compliant with [name]  - Continue home [name] .   History of Hodgkin's lymphoma: Stage III.  Status post 6 cycles of chemotherapy.  Posttreatment.  Scan showed no evidence of residual or recurrence of disease.  Followed by Dr. Federico. - Outpatient follow-up   Essential hypertension: BP slightly elevated today - Continue amlodipine  10 mg   Seasonal allergies - Continue home meds        Consultants: none Procedures performed: none  Disposition: Home Diet recommendation:  Regular diet DISCHARGE MEDICATION: Allergies as of 06/08/2024       Reactions   Gabapentin  Nausea And Vomiting   Lisinopril  Other (See Comments), Cough   Dry cough   Nickel Rash        Medication List     STOP taking these medications    mupirocin  ointment 2 % Commonly known as: BACTROBAN    ondansetron  8 MG disintegrating tablet Commonly known as: ZOFRAN -ODT   Ozempic  (0.25 or 0.5 MG/DOSE) 2 MG/1.5ML Sopn Generic drug: Semaglutide (0.25 or 0.5MG /DOS)   prochlorperazine  10 MG tablet Commonly known as: COMPAZINE        TAKE these medications    Acetaminophen  500 MG capsule Take 500-1,000  mg by mouth See admin instructions. Tylenol  Rapid Release 500 mg capsules - Take 500-1,000 mg by mouth every eight hours as needed for pain or headaches   albuterol  108 (90 Base) MCG/ACT inhaler Commonly known as: VENTOLIN  HFA Inhale 2 puffs into the lungs every 4 (four) hours as needed for wheezing or shortness of breath. What changed: when to take this   amLODipine   10 MG tablet Commonly known as: NORVASC  Take 1 tablet by mouth once daily What changed: when to take this   azelastine  0.05 % ophthalmic solution Commonly known as: OPTIVAR  Place 1 drop into both eyes 2 (two) times daily as needed (for itching).   azelastine  0.1 % nasal spray Commonly known as: ASTELIN  Place 1-2 sprays into both nostrils 2 (two) times daily as needed for rhinitis or allergies. 1-2 puffs in each nostril twice daily   [name]  50-200-25 MG Tabs tablet Generic drug: bictegravir-emtricitabine -tenofovir  AF TAKE 1 TABLET DAILY What changed: when to take this   Breo Ellipta  200-25 MCG/ACT Aepb Generic drug: fluticasone  furoate-vilanterol Inhale 1 puff by mouth once daily What changed: See the new instructions.   EPINEPHrine  0.3 mg/0.3 mL Soaj injection Commonly known as: EPI-PEN Inject 0.3 mg into the muscle as needed for anaphylaxis.   fluticasone  50 MCG/ACT nasal spray Commonly known as: FLONASE  Place 1 spray into both nostrils 2 (two) times daily as needed for rhinitis or allergies.   guaiFENesin  600 MG 12 hr tablet Commonly known as: MUCINEX  Take 1 tablet (600 mg total) by mouth 2 (two) times daily.   ipratropium 0.06 % nasal spray Commonly known as: ATROVENT  Place 2 sprays into both nostrils 4 (four) times daily as needed for rhinitis.   levalbuterol  0.63 MG/3ML nebulizer solution Commonly known as: XOPENEX  Take 3 mLs (0.63 mg total) by nebulization in the morning, at noon, in the evening, and at bedtime.   montelukast  10 MG tablet Commonly known as: SINGULAIR  Take 10 mg by mouth at bedtime.   predniSONE  10 MG tablet Commonly known as: DELTASONE  Take 5 tablets (50 mg total) by mouth daily with breakfast for 2 days, THEN 4 tablets (40 mg total) daily with breakfast for 2 days, THEN 3 tablets (30 mg total) daily with breakfast for 1 day, THEN 2 tablets (20 mg total) daily with breakfast for 1 day, THEN 1 tablet (10 mg total) daily with breakfast for 1  day. Start taking on: June 08, 2024 What changed:  medication strength See the new instructions.   sildenafil  100 MG tablet Commonly known as: VIAGRA  Take 100 mg by mouth daily as needed (for E.D.).   tadalafil  5 MG tablet Commonly known as: CIALIS  Take 1 tablet (5 mg total) by mouth daily as needed for erectile dysfunction. What changed: when to take this   Vitamin D (Ergocalciferol) 1.25 MG (50000 UNIT) Caps capsule Commonly known as: DRISDOL Take 50,000 Units by mouth every Tuesday.   Zepbound 5 MG/0.5ML Pen Generic drug: tirzepatide Inject 5 mg into the skin every Monday.               Durable Medical Equipment  (From admission, onward)           Start     Ordered   06/07/24 0923  For home use only DME Nebulizer machine  Once       Question Answer Comment  Patient needs a nebulizer to treat with the following condition Acute asthma exacerbation   Length of Need 6 Months   Additional equipment included  Administration kit      06/07/24 0922            Discharge Exam: Fredricka Weights   06/07/24 0454  Weight: 113.3 kg   General exam: awake, alert, no acute distress HEENT: moist mucus membranes, hearing grossly normal  Respiratory system: improved aeration, minimal expiratory wheezes, no rhonchi, normal respiratory effort. On room air Cardiovascular system: normal S1/S2, RRR, no JVD, murmurs, rubs, gallops, no pedal edema.   Gastrointestinal system: soft, NT, ND, no HSM felt, +bowel sounds. Central nervous system: A&O x3. no gross focal neurologic deficits, normal speech Extremities: moves all , no edema, normal tone Skin: dry, intact, normal temperature Psychiatry: normal mood, congruent affect, judgement and insight appear normal   Condition at discharge: stable  The results of significant diagnostics from this hospitalization (including imaging, microbiology, ancillary and laboratory) are listed below for reference.   Imaging Studies: DG  Chest 2 View Result Date: 06/05/2024 CLINICAL DATA:  Shortness of breath. EXAM: CHEST - 2 VIEW COMPARISON:  Chest radiograph dated 06/04/2024. FINDINGS: There is peribronchial thickening which may represent bronchitis or reactive airway disease. Left infrahilar/lingular density may represent atelectasis or infiltrate. No pleural effusion or pneumothorax. The cardiac silhouette is within normal limits. No acute osseous pathology. IMPRESSION: 1. Findings suggestive of bronchitis or reactive airway disease. 2. Left infrahilar/lingular atelectasis or infiltrate. Electronically Signed   By: Vanetta Chou M.D.   On: 06/05/2024 15:48   DG Chest 2 View Result Date: 06/04/2024 CLINICAL DATA:  Shortness of breath EXAM: CHEST - 2 VIEW COMPARISON:  Chest x-ray 12/28/2021, CT chest abdomen and pelvis 04/14/2024. FINDINGS: There central peribronchial wall thickening bilaterally. There is mild prominence of the bilateral hilar regions likely related to adenopathy seen on prior study. There is no focal lung infiltrate, pleural effusion or pneumothorax. The cardiac silhouette is within normal limits. The osseous structures appear normal. IMPRESSION: 1. Central peribronchial wall thickening bilaterally, which can be seen in the setting of bronchitis or reactive airway disease. 2. Mild prominence of the bilateral hilar regions likely related to adenopathy seen on prior study. Electronically Signed   By: Greig Pique M.D.   On: 06/04/2024 18:55    Microbiology: Results for orders placed or performed during the hospital encounter of 06/05/24  Resp panel by RT-PCR (RSV, Flu A&B, Covid) Anterior Nasal Swab     Status: None   Collection Time: 06/05/24  3:12 PM   Specimen: Anterior Nasal Swab  Result Value Ref Range Status   SARS Coronavirus 2 by RT PCR NEGATIVE NEGATIVE Final    Comment: (NOTE) SARS-CoV-2 target nucleic acids are NOT DETECTED.  The SARS-CoV-2 RNA is generally detectable in upper  respiratory specimens during the acute phase of infection. The lowest concentration of SARS-CoV-2 viral copies this assay can detect is 138 copies/mL. A negative result does not preclude SARS-Cov-2 infection and should not be used as the sole basis for treatment or other patient management decisions. A negative result may occur with  improper specimen collection/handling, submission of specimen other than nasopharyngeal swab, presence of viral mutation(s) within the areas targeted by this assay, and inadequate number of viral copies(<138 copies/mL). A negative result must be combined with clinical observations, patient history, and epidemiological information. The expected result is Negative.  Fact Sheet for Patients:  bloggercourse.com  Fact Sheet for Healthcare Providers:  seriousbroker.it  This test is no t yet approved or cleared by the [place]  FDA and  has been authorized for detection and/or diagnosis of  SARS-CoV-2 by FDA under an Emergency Use Authorization (EUA). This EUA will remain  in effect (meaning this test can be used) for the duration of the COVID-19 declaration under Section 564(b)(1) of the Act, 21 U.S.C.section 360bbb-3(b)(1), unless the authorization is terminated  or revoked sooner.       Influenza A by PCR NEGATIVE NEGATIVE Final   Influenza B by PCR NEGATIVE NEGATIVE Final    Comment: (NOTE) The Xpert Xpress SARS-CoV-2/FLU/RSV plus assay is intended as an aid in the diagnosis of influenza from Nasopharyngeal swab specimens and should not be used as a sole basis for treatment. Nasal washings and aspirates are unacceptable for Xpert Xpress SARS-CoV-2/FLU/RSV testing.  Fact Sheet for Patients: bloggercourse.com  Fact Sheet for Healthcare Providers: seriousbroker.it  This test is not yet approved or cleared by the [place]  FDA and has been  authorized for detection and/or diagnosis of SARS-CoV-2 by FDA under an Emergency Use Authorization (EUA). This EUA will remain in effect (meaning this test can be used) for the duration of the COVID-19 declaration under Section 564(b)(1) of the Act, 21 U.S.C. section 360bbb-3(b)(1), unless the authorization is terminated or revoked.     Resp Syncytial Virus by PCR NEGATIVE NEGATIVE Final    Comment: (NOTE) Fact Sheet for Patients: bloggercourse.com  Fact Sheet for Healthcare Providers: seriousbroker.it  This test is not yet approved or cleared by the [place]  FDA and has been authorized for detection and/or diagnosis of SARS-CoV-2 by FDA under an Emergency Use Authorization (EUA). This EUA will remain in effect (meaning this test can be used) for the duration of the COVID-19 declaration under Section 564(b)(1) of the Act, 21 U.S.C. section 360bbb-3(b)(1), unless the authorization is terminated or revoked.  Performed at [facility], [address], [place], [place] [address]   Respiratory (~20 pathogens) panel by PCR     Status: Abnormal   Collection Time: 06/05/24  3:12 PM   Specimen: Nasopharyngeal Swab; Respiratory  Result Value Ref Range Status   Adenovirus NOT DETECTED NOT DETECTED Final   Coronavirus 229E NOT DETECTED NOT DETECTED Final    Comment: (NOTE) The Coronavirus on the Respiratory Panel, DOES NOT test for the novel  Coronavirus (2019 nCoV)    Coronavirus HKU1 NOT DETECTED NOT DETECTED Final   Coronavirus NL63 NOT DETECTED NOT DETECTED Final   Coronavirus OC43 NOT DETECTED NOT DETECTED Final   Metapneumovirus NOT DETECTED NOT DETECTED Final   Rhinovirus / Enterovirus DETECTED (A) NOT DETECTED Final   Influenza A NOT DETECTED NOT DETECTED Final   Influenza B NOT DETECTED NOT DETECTED Final   Parainfluenza Virus 1 NOT DETECTED NOT DETECTED Final   Parainfluenza Virus 2 NOT DETECTED NOT  DETECTED Final   Parainfluenza Virus 3 NOT DETECTED NOT DETECTED Final   Parainfluenza Virus 4 NOT DETECTED NOT DETECTED Final   Respiratory Syncytial Virus NOT DETECTED NOT DETECTED Final   Bordetella pertussis NOT DETECTED NOT DETECTED Final   Bordetella Parapertussis NOT DETECTED NOT DETECTED Final   Chlamydophila pneumoniae NOT DETECTED NOT DETECTED Final   Mycoplasma pneumoniae NOT DETECTED NOT DETECTED Final    Comment: Performed at [facility], [address] [place], [place] [address]    Labs: CBC: Recent Labs  Lab 06/04/24 1853 06/05/24 1512 06/06/24 0514  WBC 7.5 7.5 14.0*  NEUTROABS  --  6.3  --   HGB 16.7 16.9 15.3  HCT 46.4 47.1 42.4  MCV 90.3 89.5 91.0  PLT 219 238 223  Basic Metabolic Panel: Recent Labs  Lab 06/04/24 1853 06/05/24 1512 06/06/24 0514  NA 141 139 138  K 3.5 3.9 4.1  CL 105 105 108  CO2 24 22 22   GLUCOSE 85 99 112*  BUN 12 14 19   CREATININE 1.33* 1.18 1.18  CALCIUM 9.0 9.4 8.8*   Liver Function Tests: Recent Labs  Lab 06/06/24 0514  AST 17  ALT 8  ALKPHOS 83  BILITOT 0.5  PROT 7.8  ALBUMIN  3.5   CBG: Recent Labs  Lab 06/06/24 1657  GLUCAP 140*    Discharge time spent: less than 30 minutes.  Signed: Burnard DELENA Cunning, DO [facility] 06/08/2024

## 2024-06-07 NOTE — Plan of Care (Signed)

## 2024-06-07 NOTE — Progress Notes (Signed)
 Patient had a discharge order. When I was doing the discharge he expressed concern for going home too early. His concern is that although he is feeling a little better, he is coughing a lot and is having chest discomfort with the coughing and would like to stay one more day to make sure he is over the hump. MD Fausto aware and okay with staying one more night.

## 2024-06-07 NOTE — Progress Notes (Signed)
 Progress Note   Patient: Lee [name] FMW:[id number] DOB: 21-Aug-1980 DOA: 06/05/2024     1 DOS: the patient was seen and examined on 06/07/2024   Brief hospital course: Per H&P:  Lee [name] is a 43 y.o. male with PMH of HIV, Hodgkin's lymphoma, gallstone pancreatitis, syphilis, HTN and sensorineural hearing loss presented to ED with ongoing respiratory symptoms.   Patient presented to [facility] ED last night with SOB, DOE, wheeze, cough, sinus congestion and subjective fever.  He was hypoxic to 88%.  He was offered hospital admission for further treatment but he decided to go home instead of waiting on ambulance for 4 to 6 hours.    Patient had worsening symptoms after he went home with increased chest tightness, DOE, and wheezing that prompted him to return to ED. he stated that he has to stop to catch his breath even to get to bathroom.  He tried to use the rescue inhaler without significant improvement. ...  Patient was admitted for acute asthma exacerbation, started on systemic and inhaled steroids, scheduled and PRN nebulized bronchodilators as outlined below.  Full respiratory viral panel + Rhinovirus infection.    Assessment and Plan:  Acute asthma exacerbation: Today has persistent diffuse wheezing, but severity of dyspnea has improved.  Remains on room air. Rhinovirus infection - Continue p.o. prednisone  - Scheduled & PRN Xopenex  nebs given tachycardia. - Pulmicort 0.5 mg twice daily - Supportive / symptomatic care per orders 11/15 --patient maintains O2 sats above 90% on room air with ambulation.  Ongoing persistent wheezing and dyspnea worse than baseline.  Will monitor for at least 24 more hours.   Sinus tachycardia: Likely due to nebulizers. - Xopenex  instead of albuterol    HIV: Undetectable HIV-1 RNA with CD4 counts of 944 on 10/7.  Compliant with [name]  - Continue home [name] .   History of Hodgkin's lymphoma: Stage III.  Status post 6  cycles of chemotherapy.  Posttreatment.  Scan showed no evidence of residual or recurrence of disease.  Followed by Dr. Federico. - Outpatient follow-up   Essential hypertension: BP slightly elevated today - Continue amlodipine  10 mg   Seasonal allergies - Continue home meds after med rec      Subjective: Pt seen awake sitting up in bed on rounds today.  He had just had a breathing treatment prior to that was still wheezing a lot.  Wheezing currently improved in the hall and confirmed he does not need any oxygen.  He does not use a nebulizer machine at home but requests to get one if possible.  No fevers chills or other acute complaints   Physical Exam: Vitals:   06/07/24 0740 06/07/24 0742 06/07/24 0858 06/07/24 1218  BP:   134/89 117/85  Pulse:   (!) 103 (!) 107  Resp:   18 18  Temp:   98.2 F (36.8 C) 98.8 F (37.1 C)  TempSrc:   Oral Oral  SpO2: 96% 96% 96% 96%  Weight:      Height:       General exam: awake, alert, no acute distress HEENT: moist mucus membranes, hearing grossly normal  Respiratory system: Intermittent expiratory wheezes shortly after neb treatment, improved from yesterday but still sounds tight, normal respiratory effort, no accessory muscle use and able to speak in full sentences. Cardiovascular system: Regular rate and rhythm, no peripheral edema Gastrointestinal system: Abdomen soft nondistended. Central nervous system: A&O x3. no gross focal neurologic deficits, normal speech Extremities: moves all, no edema, normal tone  Skin: Dry, normal temperature Psychiatry: normal mood, congruent affect, judgement and insight appear normal  Data Reviewed: No new labs today Notable labs from 11/14--  Glucose 112, Ca 8.8 otherwise normal CMP WBC 14.0 likely steroid-induced  Family Communication: at bedside on rounds  Disposition: Status is: Inpatient Remains inpatient appropriate because: Patient has ongoing wheezing, requires further clinical  improvement   Planned Discharge Destination: Home    Time spent: 20 minutes  Author: Burnard DELENA Cunning, DO 06/07/2024 3:34 PM  For on call review [email or web address].

## 2024-06-08 NOTE — Progress Notes (Signed)
SATURATION QUALIFICATIONS: (This note is used to comply with regulatory documentation for home oxygen)  Patient Saturations on Room Air at Rest = 94%  Patient Saturations on Room Air while Ambulating = 94%  Patient Saturations on 0 Liters of oxygen while Ambulating = 94%  Please briefly explain why patient needs home oxygen: 

## 2024-06-08 NOTE — Plan of Care (Signed)
   Problem: Education: Goal: Knowledge of General Education information will improve Description Including pain rating scale, medication(s)/side effects and non-pharmacologic comfort measures Outcome: Progressing   Problem: Clinical Measurements: Goal: Ability to maintain clinical measurements within normal limits will improve Outcome: Progressing   Problem: Clinical Measurements: Goal: Will remain free from infection Outcome: Progressing

## 2024-06-08 NOTE — Progress Notes (Signed)
 Ambulated length of hall with patient. Sats stayed above 93% RA. Patient tolerated well and feels like he is ready to go home.

## 2024-06-09 ENCOUNTER — Telehealth: Payer: Self-pay

## 2024-06-09 NOTE — Patient Instructions (Signed)
 Visit Information  Thank you for taking time to visit with me today. Please don't hesitate to contact me if I can be of assistance to you before our next scheduled telephone appointment.  Our next appointment is by telephone on Wednesday November 26th at 11:00am  Following is a copy of your care plan:   Goals Addressed             This Visit's Progress    VBCI Transitions of Care (TOC) Care Plan       Problems:  Recent Hospitalization for treatment of Asthma Hospital or ED Adm Risk of 83%  Goal:  Over the next 30 days, the patient will not experience hospital readmission  Interventions:   Asthma: Call MD for: Worsening shortness of breath and wheezing -- especially if too short of breath to do normal daily activities or talk in full sentences. Call MD for: extreme fatigue Call MD for: persistant dizziness or light-headedness Call MD for: persistant nausea and vomiting Discharge instructions Please take prednisone  taper as prescribed -- you'll start with 50 mg next 2 days, then 40 mg for 2 days, then one day at 30 mg, 20 mg, 10 mg (done in 7 days). I sent Xopenex  nebulizer medication -- you can continue using it scheduled up to FOUR times daily. If you need as needed in between -- okay to use your albuterol  inhaler, or extra Xopenex  neb treatment.  Patient Self Care Activities:  Attend all scheduled provider appointments Call pharmacy for medication refills 3-7 days in advance of running out of medications Call provider office for new concerns or questions  Notify RN Care Manager of [facility] call rescheduling needs Participate in Transition of Care Program/Attend [facility] scheduled calls Perform all self care activities independently  Take medications as prescribed    Plan:  Telephone follow up appointment with care management team member scheduled for:  Wednesday November 26th at 11:00am        Patient verbalizes understanding of instructions and care plan provided today  and agrees to view in MyChart. Active MyChart status and patient understanding of how to access instructions and care plan via MyChart confirmed with patient.     The patient has been provided with contact information for the care management team and has been advised to call with any health related questions or concerns.   Please call the care guide team at [phone] if you need to cancel or reschedule your appointment.   Please call the Suicide and Crisis Lifeline: 988 call the [place]  National Suicide Prevention Lifeline: 765-170-3404 or TTY: 314-311-5656 TTY 505-445-6688) to talk to a trained counselor if you are experiencing a Mental Health or Behavioral Health Crisis or need someone to talk to.  Medford Balboa, BSN, RN [place]  VBCI - [employer] [facility] RN Care Manager [phone]

## 2024-06-09 NOTE — Transitions of Care (Post Inpatient/ED Visit) (Signed)
 06/09/2024  Name: Lee [name] MRN: [id number] DOB: April 29, 1981  Today's TOC FU Call Status: Today's TOC FU Call Status:: Successful TOC FU Call Completed Unsuccessful Call (1st Attempt) Date: 06/09/24 [facility] FU Call Complete Date: 06/09/24  Patient's Name and Date of Birth confirmed. Name, DOB  Transition Care Management Follow-up Telephone Call Date of Discharge: 06/08/24 Discharge Facility: [facility]) Type of Discharge: Inpatient Admission Primary Inpatient Discharge Diagnosis:: Asthma, Hodgkin Lymphoma How have you been since you were released from the hospital?: Better Any questions or concerns?: Yes Patient Questions/Concerns:: doesn't have the Mucines. Informed patient he can buy it over the counter Patient Questions/Concerns Addressed: Other: (CM provided answer)  Items Reviewed: Did you receive and understand the discharge instructions provided?: Yes Medications obtained,verified, and reconciled?: Yes (Medications Reviewed) Any new allergies since your discharge?: No Dietary orders reviewed?: NA Do you have support at home?: Yes People in Home [RPT]: alone Name of Support/Comfort Primary Source: Maggie Bucher  Medications Reviewed Today: Medications Reviewed Today     Reviewed by Moises Reusing, RN (Case Manager) on 06/09/24 at 1155  Med List Status: <None>   Medication Order Taking? Sig Documenting Provider Last Dose Status Informant  Acetaminophen  500 MG capsule [id number] No Take 500-1,000 mg by mouth See admin instructions. Tylenol  Rapid Release 500 mg capsules - Take 500-1,000 mg by mouth every eight hours as needed for pain or headaches [provider] Unknown Active Self  albuterol  (VENTOLIN  HFA) 108 (90 Base) MCG/ACT inhaler [id number]  Inhale 2 puffs into the lungs every 4 (four) hours as needed for wheezing or shortness of breath. Fausto Sor A, DO  Active   amLODipine  (NORVASC ) 10 MG tablet [id number] No Take 1 tablet by mouth once  daily  Patient taking differently: Take 10 mg by mouth at bedtime.   Eben Reyes BROCKS, MD 06/04/2024 Bedtime Active Self  azelastine  (ASTELIN ) 0.1 % nasal spray [id number] No Place 1-2 sprays into both nostrils 2 (two) times daily as needed for rhinitis or allergies. 1-2 puffs in each nostril twice daily Frutoso Luz, MD Unknown Active Self  azelastine  (OPTIVAR ) 0.05 % ophthalmic solution [id number] No Place 1 drop into both eyes 2 (two) times daily as needed (for itching). [provider] Unknown Active Self  [name]  50-200-25 MG TABS tablet [id number] No TAKE 1 TABLET DAILY  Patient taking differently: Take 1 tablet by mouth at bedtime.   Eben Reyes BROCKS, MD 06/05/2024  2:00 AM Active Self  BREO ELLIPTA  200-25 MCG/ACT AEPB [id number] No Inhale 1 puff by mouth once daily  Patient taking differently: Inhale 1 puff into the lungs at bedtime.   Eben Reyes BROCKS, MD 06/05/2024  2:00 AM Active Self  EPINEPHrine  0.3 mg/0.3 mL IJ SOAJ injection [id number] No Inject 0.3 mg into the muscle as needed for anaphylaxis. Frutoso Luz, MD Unknown Active Self  fluticasone  (FLONASE ) 50 MCG/ACT nasal spray [id number] No Place 1 spray into both nostrils 2 (two) times daily as needed for rhinitis or allergies. [provider] Unknown Active Self  guaiFENesin  (MUCINEX ) 600 MG 12 hr tablet [id number]  Take 1 tablet (600 mg total) by mouth 2 (two) times daily. Fausto Sor A, DO  Active   ipratropium (ATROVENT ) 0.06 % nasal spray [id number] No Place 2 sprays into both nostrils 4 (four) times daily as needed for rhinitis. [provider] Unknown Active Self  levalbuterol  (XOPENEX ) 0.63 MG/3ML nebulizer solution [id number]  Take 3 mLs (0.63 mg total) by nebulization in the morning, at noon, in the  evening, and at bedtime. Fausto Sor A, DO  Active   montelukast  (SINGULAIR ) 10 MG tablet [id number] No Take 10 mg by mouth at bedtime. [provider] 06/04/2024 Bedtime Active Self   predniSONE  (DELTASONE ) 10 MG tablet [id number]  Take 5 tablets (50 mg total) by mouth daily with breakfast for 2 days, THEN 4 tablets (40 mg total) daily with breakfast for 2 days, THEN 3 tablets (30 mg total) daily with breakfast for 1 day, THEN 2 tablets (20 mg total) daily with breakfast for 1 day, THEN 1 tablet (10 mg total) daily with breakfast for 1 day. Fausto Sor LABOR, DO  Active   sildenafil  (VIAGRA ) 100 MG tablet [id number] No Take 100 mg by mouth daily as needed (for E.D.). [provider] Unknown Active Self  tadalafil (CIALIS) 5 MG tablet [id number] No Take 1 tablet (5 mg total) by mouth daily as needed for erectile dysfunction.  Patient taking differently: Take 5 mg by mouth at bedtime.   Eben Reyes BROCKS, MD 06/05/2024  2:00 AM Active Self  Vitamin D, Ergocalciferol, (DRISDOL) 1.25 MG (50000 UNIT) CAPS capsule [id number] No Take 50,000 Units by mouth every Tuesday. [provider] 06/03/2024 Active Self  ZEPBOUND 5 MG/0.5ML Pen [id number] No Inject 5 mg into the skin every Monday. [provider] 06/02/2024 Active Self            Home Care and Equipment/Supplies: Were Home Health Services Ordered?: NA Any new equipment or medical supplies ordered?: Yes Name of Medical supply agency?: [employer] Were you able to get the equipment/medical supplies?: Yes Do you have any questions related to the use of the equipment/supplies?: No  Functional Questionnaire: Do you need assistance with bathing/showering or dressing?: No Do you need assistance with meal preparation?: No Do you need assistance with eating?: No Do you have difficulty maintaining continence: No Do you need assistance with getting out of bed/getting out of a chair/moving?: No Do you have difficulty managing or taking your medications?: No  Follow up appointments reviewed: PCP Follow-up appointment confirmed?: Yes Date of PCP follow-up appointment?: 06/17/24 Follow-up Provider: [facility] Follow-up appointment confirmed?: NA Do you need transportation to your follow-up appointment?: No Do you understand care options if your condition(s) worsen?: Yes-patient verbalized understanding  SDOH Interventions Today    Flowsheet Row Most Recent Value  SDOH Interventions   Food Insecurity Interventions Intervention Not Indicated  Housing Interventions Intervention Not Indicated  Transportation Interventions Intervention Not Indicated  Utilities Interventions Intervention Not Indicated    Goals Addressed             This Visit's Progress    VBCI Transitions of Care (TOC) Care Plan       Problems:  Recent Hospitalization for treatment of Asthma Hospital or ED Adm Risk of 83%  Goal:  Over the next 30 days, the patient will not experience hospital readmission  Interventions:   Asthma: Call MD for: Worsening shortness of breath and wheezing -- especially if too short of breath to do normal daily activities or talk in full sentences. Call MD for: extreme fatigue Call MD for: persistant dizziness or light-headedness Call MD for: persistant nausea and vomiting Discharge instructions Please take prednisone  taper as prescribed -- you'll start with 50 mg next 2 days, then 40 mg for 2 days, then one day at 30 mg, 20 mg, 10 mg (done in 7 days). I sent Xopenex  nebulizer medication -- you can continue using it scheduled up to FOUR times  daily. If you need as needed in between -- okay to use your albuterol  inhaler, or extra Xopenex  neb treatment.  Patient Self Care Activities:  Attend all scheduled provider appointments Call pharmacy for medication refills 3-7 days in advance of running out of medications Call provider office for new concerns or questions  Notify RN Care Manager of [facility] call rescheduling needs Participate in Transition of Care Program/Attend [facility] scheduled calls Perform all self care activities independently  Take medications as  prescribed    Plan:  Telephone follow up appointment with care management team member scheduled for:  Wednesday November 26th at 11:00am        Medford Balboa, BSN, RN [place]  VBCI - Population Health RN Care Manager [phone]

## 2024-06-16 ENCOUNTER — Telehealth: Payer: Self-pay | Admitting: Pharmacy Technician

## 2024-06-16 ENCOUNTER — Ambulatory Visit (HOSPITAL_COMMUNITY)
Admission: RE | Admit: 2024-06-16 | Discharge: 2024-06-16 | Disposition: A | Discharge status: Home / Self Care | Source: Ambulatory Visit | Attending: Infectious Diseases | Admitting: Infectious Diseases

## 2024-06-16 ENCOUNTER — Encounter: Payer: Self-pay | Admitting: Infectious Diseases

## 2024-06-16 VITALS — BP 147/99 | HR 111 | Resp 16

## 2024-06-16 DIAGNOSIS — B20 Human immunodeficiency virus [HIV] disease: Secondary | ICD-10-CM | POA: Diagnosis present

## 2024-06-16 DIAGNOSIS — Z21 Asymptomatic human immunodeficiency virus [HIV] infection status: Secondary | ICD-10-CM | POA: Insufficient documentation

## 2024-06-16 MED ORDER — CABOTEGRAVIR & RILPIVIRINE ER 600 & 900 MG/3ML IM SUER
1.0000 | Freq: Once | INTRAMUSCULAR | Status: AC
  Administered 2024-06-16: 1 via INTRAMUSCULAR
  Filled 2024-06-16: qty 6

## 2024-06-16 NOTE — Telephone Encounter (Addendum)
 Auth Submission: NO AUTH NEEDED Site of care: [facility] Payer: BCBS Medication & CPT/J Code(s) submitted: CABENUVA  Diagnosis Code: Z21. B20 Route of submission (phone, fax, portal): PHONE Phone # Fax # Auth type: Buy/Bill PB Units/visits requested:  Reference number: [id number] Approval from: 06/16/24 to 06/16/25   Called to verify no shara is needed under Medical Benefit. Auth was cancelled and auth letter has been faxed to Dr. Eben office.

## 2024-06-17 ENCOUNTER — Ambulatory Visit: Payer: Self-pay | Admitting: Adult Health

## 2024-06-17 ENCOUNTER — Encounter: Payer: Self-pay | Admitting: Adult Health

## 2024-06-17 ENCOUNTER — Ambulatory Visit: Payer: BC Managed Care – PPO | Admitting: Adult Health

## 2024-06-17 VITALS — BP 122/76 | HR 79 | Temp 98.2°F | Ht 75.0 in | Wt 258.0 lb

## 2024-06-17 DIAGNOSIS — Z0000 Encounter for general adult medical examination without abnormal findings: Secondary | ICD-10-CM

## 2024-06-17 DIAGNOSIS — J4520 Mild intermittent asthma, uncomplicated: Secondary | ICD-10-CM

## 2024-06-17 DIAGNOSIS — I10 Essential (primary) hypertension: Secondary | ICD-10-CM | POA: Diagnosis not present

## 2024-06-17 DIAGNOSIS — J309 Allergic rhinitis, unspecified: Secondary | ICD-10-CM

## 2024-06-17 DIAGNOSIS — R351 Nocturia: Secondary | ICD-10-CM

## 2024-06-17 DIAGNOSIS — C8118 Nodular sclerosis classical Hodgkin lymphoma, lymph nodes of multiple sites: Secondary | ICD-10-CM

## 2024-06-17 DIAGNOSIS — Z21 Asymptomatic human immunodeficiency virus [HIV] infection status: Secondary | ICD-10-CM | POA: Diagnosis not present

## 2024-06-17 DIAGNOSIS — E66811 Obesity, class 1: Secondary | ICD-10-CM

## 2024-06-17 LAB — CBC
HCT: 46.9 % (ref 39.0–52.0)
Hemoglobin: 15.7 g/dL (ref 13.0–17.0)
MCHC: 33.6 g/dL (ref 30.0–36.0)
MCV: 96.8 fl (ref 78.0–100.0)
Platelets: 194 K/uL (ref 150.0–400.0)
RBC: 4.85 Mil/uL (ref 4.22–5.81)
RDW: 14 % (ref 11.5–15.5)
WBC: 11.3 K/uL — ABNORMAL HIGH (ref 4.0–10.5)

## 2024-06-17 LAB — COMPREHENSIVE METABOLIC PANEL WITH GFR
ALT: 16 U/L (ref 0–53)
AST: 13 U/L (ref 0–37)
Albumin: 3.6 g/dL (ref 3.5–5.2)
Alkaline Phosphatase: 64 U/L (ref 39–117)
BUN: 15 mg/dL (ref 6–23)
CO2: 30 meq/L (ref 19–32)
Calcium: 8.6 mg/dL (ref 8.4–10.5)
Chloride: 102 meq/L (ref 96–112)
Creatinine, Ser: 1.28 mg/dL (ref 0.40–1.50)
GFR: 69.6 mL/min (ref >60.00)
Glucose, Bld: 70 mg/dL (ref 70–99)
Potassium: 4.2 meq/L (ref 3.5–5.1)
Sodium: 139 meq/L (ref 135–145)
Total Bilirubin: 0.9 mg/dL (ref 0.2–1.2)
Total Protein: 6.6 g/dL (ref 6.0–8.3)

## 2024-06-17 LAB — LIPID PANEL
Cholesterol: 177 mg/dL (ref 0–200)
HDL: 58.4 mg/dL (ref >39.00)
LDL Cholesterol: 109 mg/dL — ABNORMAL HIGH (ref 0–99)
NonHDL: 118.74
Total CHOL/HDL Ratio: 3
Triglycerides: 50 mg/dL (ref 0.0–149.0)
VLDL: 10 mg/dL (ref 0.0–40.0)

## 2024-06-17 LAB — PSA: PSA: 0.52 ng/mL (ref 0.10–4.00)

## 2024-06-17 LAB — TSH: TSH: 2.55 u[IU]/mL (ref 0.35–5.50)

## 2024-06-17 NOTE — Progress Notes (Signed)
 Subjective:    Patient ID: Lee [name], male    DOB: [birth year], 43 y.o.   MRN: [id number]  HPI Patient presents for yearly preventative medicine examination. He is a pleasant 43 year old male who  has a past medical history of CKD (chronic kidney disease) stage 3, GFR 30-59 ml/min (HCC) (02/20/2019), Eczema, Environmental allergies, Essential hypertension, HIV test positive (HCC), Hodgkin's lymphoma (HCC), 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HCC) (07/24/2016), and Shingles.  Nodular Sclerosis Hodgkin's Lymphoma,-he had a CT scan done in November 2023 which showed bulky adenopathy in the deep right pectoral region.  He then had a CT of the chest and December 2024 which showed bulky right axillary lymph node enlargement with nodule mass measuring 7 cm in length.  In February 2024 he had an ultrasound-guided biopsy of the right axilla lymphadenopathy which was consistent with classical Hodgkin's lymphoma.  This seen by Dr. Federico completed chemotherapy.  His mid treatment PET/CT on 12/19/2022 showed excellent response to treatment. Post treatment PET scan shows no evidence of residual or recurrent disease. CT scan on 04/14/2024 showed no evidence of recurrence. Next CT scan in March 2026.   HIV -diagnosed in January 2018.  Was started  Cabenuva  yesterday. He is seen by ID on a routine basis.  Hypertension- managed with Norvasc  10 mg daily.  He denies dizziness, lightheadedness, blurred vision, chest pain, or shortness of breath BP Readings from Last 3 Encounters:  06/17/24 122/76  06/16/24 (!) 147/99  06/08/24 121/74   Seasonal Allergies with asthma-managed by [place] [facility] asthma.  Currently managed with albuterol  rescue inhaler, Breo Ellipta  daily inhaler, Singulair , and Astelin  nasal spray. He recently had a hospital admission for an acute asthma exacerbation.   Obesity - recently started seeing Vernell Pass with [facility] and is currently on Zepbound 2.5 mg weekly.  He  has also started eating healthier and going to the gym more frequently.  Nocturia - he reports that on and off he will have nocturia, up to 3-4 times a night.   All immunizations and health maintenance protocols were reviewed with the patient and needed orders were placed.  Appropriate screening laboratory values were ordered for the patient including screening of hyperlipidemia, renal function and hepatic function. If indicated by BPH, a PSA was ordered.  Medication reconciliation,  past medical history, social history, problem list and allergies were reviewed in detail with the patient  Goals were established with regard to weight loss, exercise, and  diet in compliance with medications Wt Readings from Last 3 Encounters:  06/17/24 258 lb (117 kg)  06/09/24 249 lb (112.9 kg)  06/07/24 249 lb 12.5 oz (113.3 kg)    Review of Systems  Constitutional: Negative.   HENT: Negative.    Eyes: Negative.   Respiratory: Negative.    Cardiovascular: Negative.   Gastrointestinal: Negative.   Endocrine: Negative.   Genitourinary: Negative.   Musculoskeletal: Negative.   Skin: Negative.   Allergic/Immunologic: Negative.   Neurological: Negative.   Hematological: Negative.   Psychiatric/Behavioral: Negative.    All other systems reviewed and are negative.  Past Medical History:  Diagnosis Date   CKD (chronic kidney disease) stage 3, GFR 30-59 ml/min (HCC) 02/20/2019   Eczema    Environmental allergies    Essential hypertension    HIV test positive (HCC)    Hodgkin's lymphoma (HCC)    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HCC) 07/24/2016   Shingles     Social History   Socioeconomic History  Marital status: Single    Spouse name: Not on file   Number of children: Not on file   Years of education: Not on file   Highest education level: Associate degree: academic program  Occupational History   Not on file  Tobacco Use   Smoking status: Never   Smokeless tobacco: Never   Vaping Use   Vaping status: Never Used  Substance and Sexual Activity   Alcohol  use: No   Drug use: No   Sexual activity: Not Currently    Partners: Male    Birth control/protection: Condom    Comment: Given Condoms  Other Topics Concern   Not on file  Social History Narrative   Works as ACUPUNCTURIST    -Not married   Social Drivers of Corporate Investment Banker Strain: Low Risk  (06/13/2024)   Overall Financial Resource Strain (CARDIA)    Difficulty of Paying Living Expenses: Not hard at all  Food Insecurity: No Food Insecurity (06/13/2024)   Hunger Vital Sign    Worried About Running Out of Food in the Last Year: Never true    Ran Out of Food in the Last Year: Never true  Transportation Needs: No Transportation Needs (06/13/2024)   PRAPARE - Administrator, Civil Service (Medical): No    Lack of Transportation (Non-Medical): No  Physical Activity: Sufficiently Active (06/13/2024)   Exercise Vital Sign    Days of Exercise per Week: 4 days    Minutes of Exercise per Session: 90 min  Stress: No Stress Concern Present (06/13/2024)   [employer] of Occupational Health - Occupational Stress Questionnaire    Feeling of Stress: Not at all  Social Connections: Moderately Integrated (06/13/2024)   Social Connection and Isolation Panel    Frequency of Communication with Friends and Family: More than three times a week    Frequency of Social Gatherings with Friends and Family: More than three times a week    Attends Religious Services: 1 to 4 times per year    Active Member of Clubs or Organizations: Yes    Attends Banker Meetings: 1 to 4 times per year    Marital Status: Never married  Intimate Partner Violence: Not At Risk (06/09/2024)   Humiliation, Afraid, Rape, and Kick questionnaire    Fear of Current or Ex-Partner: No    Emotionally Abused: No    Physically Abused: No    Sexually Abused: No    Past Surgical History:  Procedure Laterality Date    CHOLECYSTECTOMY N/A 12/30/2021   Procedure: LAPAROSCOPIC CHOLECYSTECTOMY WITH  CHOLANGIOGRAM;  Surgeon: Eletha Boas, MD;  Location: [facility];  Service: General;  Laterality: N/A;   IR IMAGING GUIDED PORT INSERTION  10/18/2022   IR REMOVAL TUN ACCESS W/ PORT W/O FL MOD SED  11/16/2023   LAPAROSCOPIC APPENDECTOMY N/A 03/12/2020   Procedure: APPENDECTOMY LAPAROSCOPIC;  Surgeon: Debby Hila, MD;  Location: [facility];  Service: General;  Laterality: N/A;   TYMPANOSTOMY TUBE PLACEMENT     WISDOM TOOTH EXTRACTION     WRIST SURGERY      Family History  Problem Relation Age of Onset   Hypercalcemia Mother    Hypertension Mother    Hypertension Father    Prostate cancer Father    Multiple myeloma Father     Allergies  Allergen Reactions   Gabapentin  Nausea And Vomiting   Lisinopril  Other (See Comments) and Cough    Dry cough   Nickel Rash    Current  Outpatient Medications on File Prior to Visit  Medication Sig Dispense Refill   Acetaminophen  500 MG capsule Take 500-1,000 mg by mouth See admin instructions. Tylenol  Rapid Release 500 mg capsules - Take 500-1,000 mg by mouth every eight hours as needed for pain or headaches     albuterol  (VENTOLIN  HFA) 108 (90 Base) MCG/ACT inhaler Inhale 2 puffs into the lungs every 4 (four) hours as needed for wheezing or shortness of breath.     amLODipine  (NORVASC ) 10 MG tablet Take 1 tablet by mouth once daily 90 tablet 0   azelastine  (ASTELIN ) 0.1 % nasal spray Place 1-2 sprays into both nostrils 2 (two) times daily as needed for rhinitis or allergies. 1-2 puffs in each nostril twice daily     azelastine  (OPTIVAR ) 0.05 % ophthalmic solution Place 1 drop into both eyes 2 (two) times daily as needed (for itching).     BREO ELLIPTA  200-25 MCG/ACT AEPB Inhale 1 puff by mouth once daily 60 each 0   EPINEPHrine  0.3 mg/0.3 mL IJ SOAJ injection Inject 0.3 mg into the muscle as needed for anaphylaxis.     fluticasone  (FLONASE ) 50 MCG/ACT nasal spray Place 1 spray  into both nostrils 2 (two) times daily as needed for rhinitis or allergies.     guaiFENesin  (MUCINEX ) 600 MG 12 hr tablet Take 1 tablet (600 mg total) by mouth 2 (two) times daily. 30 tablet 0   ipratropium (ATROVENT ) 0.06 % nasal spray Place 2 sprays into both nostrils 4 (four) times daily as needed for rhinitis.     levalbuterol  (XOPENEX ) 0.63 MG/3ML nebulizer solution Take 3 mLs (0.63 mg total) by nebulization in the morning, at noon, in the evening, and at bedtime. 60 mL 1   montelukast  (SINGULAIR ) 10 MG tablet Take 10 mg by mouth at bedtime.     sildenafil  (VIAGRA ) 100 MG tablet Take 100 mg by mouth daily as needed (for E.D.).     tadalafil  (CIALIS ) 5 MG tablet Take 1 tablet (5 mg total) by mouth daily as needed for erectile dysfunction. 10 tablet 1   Vitamin D, Ergocalciferol, (DRISDOL) 1.25 MG (50000 UNIT) CAPS capsule Take 50,000 Units by mouth every Tuesday.     ZEPBOUND 5 MG/0.5ML Pen Inject 5 mg into the skin every Monday.     [name]  50-200-25 MG TABS tablet TAKE 1 TABLET DAILY (Patient not taking: Reported on 06/17/2024) 30 tablet 5   No current facility-administered medications on file prior to visit.    BP 122/76   Pulse 79   Temp 98.2 F (36.8 C) (Oral)   Ht 6' 3 (1.905 m)   Wt 258 lb (117 kg)   SpO2 98%   BMI 32.25 kg/m       Objective:   Physical Exam Vitals and nursing note reviewed.  Constitutional:      General: He is not in acute distress.    Appearance: Normal appearance. He is not ill-appearing.  HENT:     Head: Normocephalic and atraumatic.     Right Ear: Tympanic membrane, ear canal and external ear normal. There is no impacted cerumen.     Left Ear: Tympanic membrane, ear canal and external ear normal. There is no impacted cerumen.     Nose: Nose normal. No congestion or rhinorrhea.     Mouth/Throat:     Mouth: Mucous membranes are moist.     Pharynx: Oropharynx is clear.  Eyes:     Extraocular Movements: Extraocular movements intact.  Conjunctiva/sclera: Conjunctivae normal.     Pupils: Pupils are equal, round, and reactive to light.  Neck:     Vascular: No carotid bruit.  Cardiovascular:     Rate and Rhythm: Normal rate and regular rhythm.     Pulses: Normal pulses.     Heart sounds: No murmur heard.    No friction rub. No gallop.  Pulmonary:     Effort: Pulmonary effort is normal.     Breath sounds: Normal breath sounds.  Abdominal:     General: Abdomen is flat. Bowel sounds are normal. There is no distension.     Palpations: Abdomen is soft. There is no mass.     Tenderness: There is no abdominal tenderness. There is no guarding or rebound.     Hernia: No hernia is present.  Musculoskeletal:        General: Normal range of motion.     Cervical back: Normal range of motion and neck supple.  Lymphadenopathy:     Cervical: No cervical adenopathy.  Skin:    General: Skin is warm and dry.     Capillary Refill: Capillary refill takes less than 2 seconds.  Neurological:     General: No focal deficit present.     Mental Status: He is alert and oriented to person, place, and time.  Psychiatric:        Mood and Affect: Mood normal.        Behavior: Behavior normal.        Thought Content: Thought content normal.        Judgment: Judgment normal.        Assessment & Plan:  1. Routine general medical examination at a health care facility (Primary) Today patient counseled on age appropriate routine health concerns for screening and prevention, each reviewed and up to date or declined. Immunizations reviewed and up to date or declined. Labs ordered and reviewed. Risk factors for depression reviewed and negative. Hearing function and visual acuity are intact. ADLs screened and addressed as needed. Functional ability and level of safety reviewed and appropriate. Education, counseling and referrals performed based on assessed risks today. Patient provided with a copy of personalized plan for preventive services. -  Refused flu shot  - Continue with lifestyle modifications   2. 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HCC) - per ID  - Lipid panel; Future - TSH; Future - CBC; Future - Comprehensive metabolic panel with GFR; Future  4. Allergic rhinitis, unspecified seasonality, unspecified trigger - Continue with current treatment   5. Mild intermittent asthma without complication - Continue with current treatment   6. Essential hypertension - well controlled. Continue with Norvasc  10 mg daily  - Lipid panel; Future - TSH; Future - CBC; Future - Comprehensive metabolic panel with GFR; Future  7. Nocturia - will check PSA  - PSA; Future  8. Class 1 obesity - Continue with current weight loss strategy  - Follow up with weight loss provider   Darleene Shape, NP

## 2024-06-18 ENCOUNTER — Telehealth: Payer: Self-pay

## 2024-06-25 ENCOUNTER — Telehealth: Payer: Self-pay

## 2024-07-02 ENCOUNTER — Other Ambulatory Visit: Payer: Self-pay

## 2024-07-02 ENCOUNTER — Other Ambulatory Visit: Payer: Self-pay | Admitting: Infectious Diseases

## 2024-07-02 DIAGNOSIS — I10 Essential (primary) hypertension: Secondary | ICD-10-CM

## 2024-07-02 NOTE — Telephone Encounter (Signed)
 Medication sent to pharmacy

## 2024-07-09 ENCOUNTER — Other Ambulatory Visit: Payer: Self-pay | Admitting: Infectious Diseases

## 2024-07-09 DIAGNOSIS — C8118 Nodular sclerosis classical Hodgkin lymphoma, lymph nodes of multiple sites: Secondary | ICD-10-CM

## 2024-07-11 ENCOUNTER — Other Ambulatory Visit: Payer: Self-pay

## 2024-07-14 ENCOUNTER — Ambulatory Visit (HOSPITAL_COMMUNITY)
Admission: RE | Admit: 2024-07-14 | Discharge: 2024-07-14 | Disposition: A | Discharge status: Home / Self Care | Source: Ambulatory Visit | Attending: Infectious Diseases | Admitting: Infectious Diseases

## 2024-07-14 VITALS — BP 128/84 | HR 93 | Temp 98.0°F | Resp 16

## 2024-07-14 DIAGNOSIS — Z21 Asymptomatic human immunodeficiency virus [HIV] infection status: Secondary | ICD-10-CM | POA: Insufficient documentation

## 2024-07-14 DIAGNOSIS — B20 Human immunodeficiency virus [HIV] disease: Secondary | ICD-10-CM | POA: Diagnosis present

## 2024-07-14 MED ORDER — CABOTEGRAVIR & RILPIVIRINE ER 600 & 900 MG/3ML IM SUER
1.0000 | Freq: Once | INTRAMUSCULAR | Status: AC
  Administered 2024-07-14: 1 via INTRAMUSCULAR
  Filled 2024-07-14: qty 6

## 2024-08-04 ENCOUNTER — Inpatient Hospital Stay: Admitting: Hematology and Oncology

## 2024-08-04 ENCOUNTER — Inpatient Hospital Stay: Attending: Hematology and Oncology

## 2024-08-04 ENCOUNTER — Other Ambulatory Visit: Payer: Self-pay | Admitting: Hematology and Oncology

## 2024-08-04 VITALS — BP 125/80 | HR 94 | Temp 98.0°F | Resp 16 | Wt 256.6 lb

## 2024-08-04 DIAGNOSIS — Z8571 Personal history of Hodgkin lymphoma: Secondary | ICD-10-CM | POA: Insufficient documentation

## 2024-08-04 DIAGNOSIS — C8118 Nodular sclerosis classical Hodgkin lymphoma, lymph nodes of multiple sites: Secondary | ICD-10-CM

## 2024-08-04 DIAGNOSIS — Z95828 Presence of other vascular implants and grafts: Secondary | ICD-10-CM | POA: Diagnosis not present

## 2024-08-04 DIAGNOSIS — Z9221 Personal history of antineoplastic chemotherapy: Secondary | ICD-10-CM | POA: Insufficient documentation

## 2024-08-04 DIAGNOSIS — Z807 Family history of other malignant neoplasms of lymphoid, hematopoietic and related tissues: Secondary | ICD-10-CM | POA: Insufficient documentation

## 2024-08-04 DIAGNOSIS — Z08 Encounter for follow-up examination after completed treatment for malignant neoplasm: Secondary | ICD-10-CM | POA: Insufficient documentation

## 2024-08-04 DIAGNOSIS — Z8042 Family history of malignant neoplasm of prostate: Secondary | ICD-10-CM | POA: Diagnosis not present

## 2024-08-04 DIAGNOSIS — K5900 Constipation, unspecified: Secondary | ICD-10-CM | POA: Diagnosis not present

## 2024-08-04 DIAGNOSIS — Z5111 Encounter for antineoplastic chemotherapy: Secondary | ICD-10-CM

## 2024-08-04 LAB — CBC WITH DIFFERENTIAL (CANCER CENTER ONLY)
Abs Immature Granulocytes: 0.01 K/uL (ref 0.00–0.07)
Basophils Absolute: 0 K/uL (ref 0.0–0.1)
Basophils Relative: 1 %
Eosinophils Absolute: 0.5 K/uL (ref 0.0–0.5)
Eosinophils Relative: 7 %
HCT: 46.1 % (ref 39.0–52.0)
Hemoglobin: 16.9 g/dL (ref 13.0–17.0)
Immature Granulocytes: 0 %
Lymphocytes Relative: 36 %
Lymphs Abs: 2.4 K/uL (ref 0.7–4.0)
MCH: 32.3 pg (ref 26.0–34.0)
MCHC: 36.7 g/dL — ABNORMAL HIGH (ref 30.0–36.0)
MCV: 88.1 fL (ref 80.0–100.0)
Monocytes Absolute: 0.5 K/uL (ref 0.1–1.0)
Monocytes Relative: 8 %
Neutro Abs: 3.2 K/uL (ref 1.7–7.7)
Neutrophils Relative %: 48 %
Platelet Count: 243 K/uL (ref 150–400)
RBC: 5.23 MIL/uL (ref 4.22–5.81)
RDW: 11.2 % — ABNORMAL LOW (ref 11.5–15.5)
WBC Count: 6.6 K/uL (ref 4.0–10.5)
nRBC: 0 % (ref 0.0–0.2)

## 2024-08-04 LAB — CMP (CANCER CENTER ONLY)
ALT: 6 U/L (ref 0–44)
AST: 17 U/L (ref 15–41)
Albumin: 3.9 g/dL (ref 3.5–5.0)
Alkaline Phosphatase: 82 U/L (ref 38–126)
Anion gap: 9 (ref 5–15)
BUN: 9 mg/dL (ref 6–20)
CO2: 25 mmol/L (ref 22–32)
Calcium: 9.1 mg/dL (ref 8.9–10.3)
Chloride: 102 mmol/L (ref 98–111)
Creatinine: 1.36 mg/dL — ABNORMAL HIGH (ref 0.61–1.24)
GFR, Estimated: >60 mL/min
Glucose, Bld: 80 mg/dL (ref 70–99)
Potassium: 4.6 mmol/L (ref 3.5–5.1)
Sodium: 135 mmol/L (ref 135–145)
Total Bilirubin: 0.7 mg/dL (ref 0.0–1.2)
Total Protein: 8.4 g/dL — ABNORMAL HIGH (ref 6.5–8.1)

## 2024-08-04 LAB — LACTATE DEHYDROGENASE: LDH: 225 U/L (ref 105–235)

## 2024-08-04 NOTE — Progress Notes (Signed)
 " [facility] [phone]   [phone]  PROGRESS NOTE  Patient Care Team: Merna Huxley, NP as PCP - General (Family Medicine) Frutoso Luz, MD as Referring Physician (Allergy) Federico Norleen ONEIDA MADISON, MD as Consulting Physician (Hematology and Oncology)  Hematological/Oncological History # Hodgkin's Lymphoma, Stage III 06/08/2022: CT neck showed Bulky adenopathy in the deep right pectoral region, recommend chest CT with contrast. There was splenomegaly on abdominal CT from the summer, consider addition of abdominal CT is well. 07/01/2023: CT chest showed Bulky RIGHT axillary lymph node enlargement, with nodal mass measuring 7 cm in length. Findings suspicious for lymphoproliferative disease 09/13/2022: US  guided biopsy of right axillary lymphadenopathy was consistent with classical Hodgkin's lymphoma  10/06/2022: establish care with Dr. Federico  10/31/2022: Cycle 1 Day 1 of A-AVD chemotherapy.  11/14/2022: Cycle 1 Day 15 of A-AVD chemotherapy 11/29/2022: Cycle 2 Day 1 of A-AVD chemotherapy 12/13/2022: Cycle 2 Day 15 of A-AVD chemotherapy (holding brentuximab and Udenyca  due to worsening skin lesions from hidradenitis suppurativa) 12/19/2022: CT PET Study showed near complete metabolic response with residual 18 mm short axis right axillary node (Deauville criteria 2). 12/27/2022: Cycle 3 Day 1 of AVD chemotherapy 01/10/2023: Cycle 3 Day 15 of AVD chemotherapy 01/23/2023:  Cycle 4 Day 1 of AVD chemotherapy 02/06/2023: Cycle 4 Day 15 of AVD chemotherapy 02/20/2023: Cycle 5 Day 1 of AVD chemotherapy 03/07/2023: Cycle 5 Day 15 of AVD chemotherapy 03/20/2023: Cycle 6 Day 1 of AVD chemotherapy 04/04/2023: Cycle 6 Day 15 of AVD chemotherapy 04/18/2023: post treatment PET CT scan shows complete response to therapy.   Interval History:  Lee [name] 44 y.o. male with medical history significant for stage III nodular sclerosing Hodgkin's lymphoma who presents for a follow up  visit. The patient's last visit was on 04/30/2024. In the interim since the last visit he has had no major changes in his health.  On exam today Mr. Colee reports he has been well overall in the interim since our last visit.  He reports that he is having a bit of an issue with dry sinuses and dry throat and does have an upcoming planned sinus surgery.  He is using a humidifier in his house.  He reports his energy levels are good and he is working out of the gym trying to do cardio.  He reports his appetite is strong.  He reports that he did have a recent admission to the hospital where he had rhinovirus in November 2025.  He reports that he is eating too well.  His weight has been increasing.  He notes he does not have any bumps or lumps concerning for lymphadenopathy.  He reports nothing else out of the ordinary.  He is looking forward to upcoming cruises and a trip to [place].  Otherwise he feels well and has no additional questions concerns or complaints today.  A full 10 point ROS is otherwise negative.  MEDICAL HISTORY:  Past Medical History:  Diagnosis Date   CKD (chronic kidney disease) stage 3, GFR 30-59 ml/min (HCC) 02/20/2019   Eczema    Environmental allergies    Essential hypertension    HIV test positive (HCC)    Hodgkin's lymphoma (HCC)    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HCC) 07/24/2016   Shingles     SURGICAL HISTORY: Past Surgical History:  Procedure Laterality Date   CHOLECYSTECTOMY N/A 12/30/2021   Procedure: LAPAROSCOPIC CHOLECYSTECTOMY WITH  CHOLANGIOGRAM;  Surgeon: Eletha Boas, MD;  Location: [facility];  Service:  General;  Laterality: N/A;   IR IMAGING GUIDED PORT INSERTION  10/18/2022   IR REMOVAL TUN ACCESS W/ PORT W/O FL MOD SED  11/16/2023   LAPAROSCOPIC APPENDECTOMY N/A 03/12/2020   Procedure: APPENDECTOMY LAPAROSCOPIC;  Surgeon: Debby Hila, MD;  Location: [facility];  Service: General;  Laterality: N/A;   TYMPANOSTOMY TUBE PLACEMENT     WISDOM TOOTH  EXTRACTION     WRIST SURGERY      SOCIAL HISTORY: Social History   Socioeconomic History   Marital status: Single    Spouse name: Not on file   Number of children: Not on file   Years of education: Not on file   Highest education level: Associate degree: academic program  Occupational History   Not on file  Tobacco Use   Smoking status: Never   Smokeless tobacco: Never  Vaping Use   Vaping status: Never Used  Substance and Sexual Activity   Alcohol  use: No   Drug use: No   Sexual activity: Not Currently    Partners: Male    Birth control/protection: Condom    Comment: Given Condoms  Other Topics Concern   Not on file  Social History Narrative   Works as ACUPUNCTURIST    -Not married   Social Drivers of Health   Tobacco Use: Low Risk (07/01/2024)   Received from [facility]   Patient History    Smoking Tobacco Use: Never    Smokeless Tobacco Use: Never    Passive Exposure: Not on file  Financial Resource Strain: Low Risk (06/13/2024)   Overall Financial Resource Strain (CARDIA)    Difficulty of Paying Living Expenses: Not hard at all  Food Insecurity: No Food Insecurity (06/13/2024)   Epic    Worried About Radiation Protection Practitioner of Food in the Last Year: Never true    Ran Out of Food in the Last Year: Never true  Transportation Needs: No Transportation Needs (06/13/2024)   Epic    Lack of Transportation (Medical): No    Lack of Transportation (Non-Medical): No  Physical Activity: Sufficiently Active (06/13/2024)   Exercise Vital Sign    Days of Exercise per Week: 4 days    Minutes of Exercise per Session: 90 min  Stress: No Stress Concern Present (06/13/2024)   [employer] of Occupational Health - Occupational Stress Questionnaire    Feeling of Stress: Not at all  Social Connections: Moderately Integrated (06/13/2024)   Social Connection and Isolation Panel    Frequency of Communication with Friends and Family: More than three times a week    Frequency of Social  Gatherings with Friends and Family: More than three times a week    Attends Religious Services: 1 to 4 times per year    Active Member of Clubs or Organizations: Yes    Attends Banker Meetings: 1 to 4 times per year    Marital Status: Never married  Intimate Partner Violence: Not At Risk (06/09/2024)   Epic    Fear of Current or Ex-Partner: No    Emotionally Abused: No    Physically Abused: No    Sexually Abused: No  Depression (PHQ2-9): Low Risk (05/20/2024)   Depression (PHQ2-9)    PHQ-2 Score: 0  Alcohol  Screen: Low Risk (06/13/2024)   Alcohol  Screen    Last Alcohol  Screening Score (AUDIT): 0  Housing: Low Risk (06/13/2024)   Epic    Unable to Pay for Housing in the Last Year: No    Number of Times Moved in the  Last Year: 0    Homeless in the Last Year: No  Utilities: Not At Risk (06/09/2024)   Epic    Threatened with loss of utilities: No  Health Literacy: Not on file    FAMILY HISTORY: Family History  Problem Relation Age of Onset   Hypercalcemia Mother    Hypertension Mother    Hypertension Father    Prostate cancer Father    Multiple myeloma Father     ALLERGIES:  is allergic to gabapentin , lisinopril , and nickel.  MEDICATIONS:  Current Outpatient Medications  Medication Sig Dispense Refill   Acetaminophen  500 MG capsule Take 500-1,000 mg by mouth See admin instructions. Tylenol  Rapid Release 500 mg capsules - Take 500-1,000 mg by mouth every eight hours as needed for pain or headaches     albuterol  (VENTOLIN  HFA) 108 (90 Base) MCG/ACT inhaler Inhale 2 puffs into the lungs every 4 (four) hours as needed for wheezing or shortness of breath.     amLODipine  (NORVASC ) 10 MG tablet Take 1 tablet by mouth once daily 90 tablet 0   azelastine  (ASTELIN ) 0.1 % nasal spray Place 1-2 sprays into both nostrils 2 (two) times daily as needed for rhinitis or allergies. 1-2 puffs in each nostril twice daily     azelastine  (OPTIVAR ) 0.05 % ophthalmic solution Place 1  drop into both eyes 2 (two) times daily as needed (for itching).     BREO ELLIPTA  200-25 MCG/ACT AEPB Inhale 1 puff by mouth once daily 60 each 0   EPINEPHrine  0.3 mg/0.3 mL IJ SOAJ injection Inject 0.3 mg into the muscle as needed for anaphylaxis.     fluticasone  (FLONASE ) 50 MCG/ACT nasal spray Place 1 spray into both nostrils 2 (two) times daily as needed for rhinitis or allergies.     guaiFENesin  (MUCINEX ) 600 MG 12 hr tablet Take 1 tablet (600 mg total) by mouth 2 (two) times daily. 30 tablet 0   ipratropium (ATROVENT ) 0.06 % nasal spray Place 2 sprays into both nostrils 4 (four) times daily as needed for rhinitis.     levalbuterol  (XOPENEX ) 0.63 MG/3ML nebulizer solution Take 3 mLs (0.63 mg total) by nebulization in the morning, at noon, in the evening, and at bedtime. 60 mL 1   montelukast  (SINGULAIR ) 10 MG tablet Take 10 mg by mouth at bedtime.     sildenafil  (VIAGRA ) 100 MG tablet TAKE 1 TABLET BY MOUTH ONCE DAILY AS NEEDED FOR ERECTILE DYSFUNCTION 10 tablet 0   tadalafil  (CIALIS ) 5 MG tablet Take 1 tablet (5 mg total) by mouth daily as needed for erectile dysfunction. 10 tablet 1   Vitamin D, Ergocalciferol, (DRISDOL) 1.25 MG (50000 UNIT) CAPS capsule Take 50,000 Units by mouth every Tuesday.     ZEPBOUND 5 MG/0.5ML Pen Inject 5 mg into the skin every Monday.     No current facility-administered medications for this visit.    REVIEW OF SYSTEMS:   Constitutional: ( - ) fevers, ( - )  chills , ( - ) night sweats Eyes: ( - ) blurriness of vision, ( - ) double vision, ( - ) watery eyes Ears, nose, mouth, throat, and face: ( - ) mucositis, ( - ) sore throat Respiratory: ( - ) cough, ( - ) dyspnea, ( - ) wheezes Cardiovascular: ( - ) palpitation, ( - ) chest discomfort, ( - ) lower extremity swelling Gastrointestinal:  ( - ) nausea, ( - ) heartburn, ( - ) change in bowel habits Skin: ( - ) abnormal skin rashes  Lymphatics: ( - ) new lymphadenopathy, ( - ) easy bruising Neurological: ( - )  numbness, ( - ) tingling, ( - ) new weaknesses Behavioral/Psych: ( - ) mood change, ( - ) new changes  All other systems were reviewed with the patient and are negative.  PHYSICAL EXAMINATION: ECOG PERFORMANCE STATUS: 1 - Symptomatic but completely ambulatory  Vitals:   08/04/24 1435  BP: 125/80  Pulse: 94  Resp: 16  Temp: 98 F (36.7 C)  SpO2: 97%        Filed Weights   08/04/24 1435  Weight: 256 lb 9.6 oz (116.4 kg)         GENERAL: Well-appearing young African-American male, alert, no distress and comfortable SKIN: skin color, texture, turgor are normal, no rashes. EYES: conjunctiva are pink and non-injected, sclera clear LUNGS: clear to auscultation and percussion with normal breathing effort HEART: regular rate & rhythm and no murmurs and no lower extremity edema Musculoskeletal: no cyanosis of digits and no clubbing  PSYCH: alert & oriented x 3, fluent speech NEURO: no focal motor/sensory deficits  LABORATORY DATA:  I have reviewed the data as listed    Latest Ref Rng & Units 08/04/2024    2:08 PM 06/17/2024    8:08 AM 06/06/2024    5:14 AM  CBC  WBC 4.0 - 10.5 K/uL 6.6  11.3  14.0   Hemoglobin 13.0 - 17.0 g/dL 83.0  84.2  84.6   Hematocrit 39.0 - 52.0 % 46.1  46.9  42.4   Platelets 150 - 400 K/uL 243  194.0  223        Latest Ref Rng & Units 08/04/2024    2:08 PM 06/17/2024    8:08 AM 06/06/2024    5:14 AM  CMP  Glucose 70 - 99 mg/dL 80  70  887   BUN 6 - 20 mg/dL 9  15  19    Creatinine 0.61 - 1.24 mg/dL 8.63  8.71  8.81   Sodium 135 - 145 mmol/L 135  139  138   Potassium 3.5 - 5.1 mmol/L 4.6  4.2  4.1   Chloride 98 - 111 mmol/L 102  102  108   CO2 22 - 32 mmol/L 25  30  22    Calcium 8.9 - 10.3 mg/dL 9.1  8.6  8.8   Total Protein 6.5 - 8.1 g/dL 8.4  6.6  7.8   Total Bilirubin 0.0 - 1.2 mg/dL 0.7  0.9  0.5   Alkaline Phos 38 - 126 U/L 82  64  83   AST 15 - 41 U/L 17  13  17    ALT 0 - 44 U/L 6  16  8       RADIOGRAPHIC STUDIES: I have  personally reviewed the radiological images as listed and agreed with the findings in the report: FDG avid lymph nodes on both sides of the diaphragm, most prominently in the cervical regions and axilla.  Consistent with at least stage III disease. No results found.    ASSESSMENT & PLAN Lee [name] is a 44 y.o. male with medical history significant for stage III nodular sclerosing Hodgkin's lymphoma who presents for a follow up visit.    # Hodgkin's Lymphoma, Stage III -- PET CT scan staging complete, findings consistent with stage III Hodgkin's lymphoma. --Will plan to proceed with A-AVD chemotherapy. --Echocardiogram complete, shows excellent baseline cardiac function. --Mid treatment PET CT scan on 12/19/2022 showed excellent response to treatment (Deauville 2).  --Due to  worsening skin lesions that is likely flaring up from Udenyca  injection, we held Brentuximab and Udenyca  injection starting Cycle 2, Day 15.  Plan:  --Labs today show white blood cell 6.6, hemoglobin 16.9, MCV 88.1, platelets 243.  Creatinine 1.36 and LFTs normal.  -- Patient has completed 6 cycles of A-AVD chemotherapy.  --Post treatment PET scan shows no evidence of residual or recurrent disease. CT scan on 04/14/2024 showed no evidence of recurrence. Next CT scan in March 2026.  --Strict neutropenic precautions given including monitor for fevers --Proceed with treatment without any further dose modifications.  --RTC in 3 months time with repeat CT scan in March 2026   #Hidradenitis Suppurativa--resolved --Under the care of dermatology and relates flare ups to GCSF injection which has greatly improved since discontinuing GCSF.  --Continue with oral doxycycline  and intralesional kenalog  as needed  #Constipation-stable: --Likely secondary to opoid pain medication --Recommend to try OTC stool softeners such as senna or miralax .    #Supportive Care -- chemotherapy education complete -- port removal order  placed.  -- zofran  8mg  q8H PRN and compazine  10mg  PO q6H for nausea -- EMLA  cream for port.  -- no pain medication required at this time.   Orders Placed This Encounter  Procedures   CT CHEST ABDOMEN PELVIS W CONTRAST    Standing Status:   Future    Expected Date:   10/13/2024    Expiration Date:   08/04/2025    If indicated for the ordered procedure, I authorize the administration of contrast media per Radiology protocol:   Yes    Does the patient have a contrast media/X-ray dye allergy?:   No    Preferred imaging location?:   [facility]    If indicated for the ordered procedure, I authorize the administration of oral contrast media per Radiology protocol:   Yes   CT Soft Tissue Neck W Contrast    Standing Status:   Future    Expected Date:   10/13/2024    Expiration Date:   08/04/2025    If indicated for the ordered procedure, I authorize the administration of contrast media per Radiology protocol:   Yes    Does the patient have a contrast media/X-ray dye allergy?:   No    Preferred imaging location?:   [facility]   All questions were answered. The patient knows to call the clinic with any problems, questions or concerns.  I have spent a total of 25 minutes minutes of face-to-face and non-face-to-face time, preparing to see the patient,performing a medically appropriate examination, counseling and educating the patient, ordering medications/tests/procedures,documenting clinical information in the electronic health record, and care coordination.   Norleen IVAR Kidney, MD Department of Hematology/Oncology [facility] Phone: [phone] Pager: [phone] Email: [email or web address]  08/04/2024 3:51 PM "

## 2024-08-05 ENCOUNTER — Other Ambulatory Visit: Payer: Self-pay

## 2024-08-15 ENCOUNTER — Other Ambulatory Visit: Payer: Self-pay | Admitting: Otolaryngology

## 2024-08-20 LAB — SURGICAL PATHOLOGY

## 2024-08-25 ENCOUNTER — Other Ambulatory Visit: Payer: Self-pay | Admitting: Infectious Diseases

## 2024-08-25 DIAGNOSIS — Z21 Asymptomatic human immunodeficiency virus [HIV] infection status: Secondary | ICD-10-CM

## 2024-08-26 NOTE — Telephone Encounter (Signed)
 Medication discontinued 08/04/24

## 2024-08-26 NOTE — Telephone Encounter (Signed)
 I'm not sure.

## 2024-08-28 NOTE — Telephone Encounter (Signed)
 Chilon please disregard this message. The patient has a appointment that has already been scheduled on 09/15/24 at the infusion center to receive a cabenuva  injection.

## 2024-09-15 ENCOUNTER — Encounter (HOSPITAL_COMMUNITY)

## 2024-11-07 ENCOUNTER — Inpatient Hospital Stay: Admitting: Physician Assistant

## 2024-11-07 ENCOUNTER — Inpatient Hospital Stay: Attending: Hematology and Oncology
